# Patient Record
Sex: Male | Born: 1955 | Race: Black or African American | Hispanic: No | Marital: Single | Smoking: Current every day smoker
Health system: Southern US, Community
[De-identification: ages and names within clinical notes are randomized; demographics above are authoritative.]

## PROBLEM LIST (undated history)

## (undated) DIAGNOSIS — R569 Unspecified convulsions: Secondary | ICD-10-CM

## (undated) DIAGNOSIS — K759 Inflammatory liver disease, unspecified: Secondary | ICD-10-CM

## (undated) DIAGNOSIS — I10 Essential (primary) hypertension: Secondary | ICD-10-CM

## (undated) DIAGNOSIS — M1990 Unspecified osteoarthritis, unspecified site: Secondary | ICD-10-CM

## (undated) DIAGNOSIS — E119 Type 2 diabetes mellitus without complications: Secondary | ICD-10-CM

## (undated) DIAGNOSIS — E785 Hyperlipidemia, unspecified: Secondary | ICD-10-CM

## (undated) DIAGNOSIS — F32A Depression, unspecified: Secondary | ICD-10-CM

## (undated) DIAGNOSIS — G8929 Other chronic pain: Secondary | ICD-10-CM

## (undated) HISTORY — PX: APPENDECTOMY: SHX54

## (undated) HISTORY — PX: ABDOMINAL SURGERY: SHX537

## (undated) HISTORY — PX: HEMORROIDECTOMY: SUR656

## (undated) HISTORY — DX: Hyperlipidemia, unspecified: E78.5

## (undated) HISTORY — PX: CYST EXCISION: SHX5701

---

## 2008-09-05 ENCOUNTER — Emergency Department (HOSPITAL_COMMUNITY): Admission: EM | Admit: 2008-09-05 | Discharge: 2008-09-05 | Payer: Self-pay | Admitting: Emergency Medicine

## 2009-02-11 ENCOUNTER — Encounter: Admission: RE | Admit: 2009-02-11 | Discharge: 2009-02-11 | Payer: Self-pay | Admitting: Neurosurgery

## 2009-08-05 ENCOUNTER — Other Ambulatory Visit: Payer: Self-pay | Admitting: Emergency Medicine

## 2009-08-05 ENCOUNTER — Ambulatory Visit: Payer: Self-pay | Admitting: Psychiatry

## 2009-08-05 ENCOUNTER — Inpatient Hospital Stay (HOSPITAL_COMMUNITY): Admission: RE | Admit: 2009-08-05 | Discharge: 2009-08-09 | Payer: Self-pay | Admitting: Psychiatry

## 2010-08-15 LAB — URINALYSIS, ROUTINE W REFLEX MICROSCOPIC
Glucose, UA: NEGATIVE mg/dL
Hgb urine dipstick: NEGATIVE
Ketones, ur: NEGATIVE mg/dL
Nitrite: NEGATIVE
Protein, ur: NEGATIVE mg/dL
Specific Gravity, Urine: 1.016 (ref 1.005–1.030)
Urobilinogen, UA: 1 mg/dL (ref 0.0–1.0)
pH: 6.5 (ref 5.0–8.0)

## 2010-08-15 LAB — CBC
HCT: 43.8 % (ref 39.0–52.0)
Hemoglobin: 14.7 g/dL (ref 13.0–17.0)
MCHC: 33.7 g/dL (ref 30.0–36.0)
MCV: 103.8 fL — ABNORMAL HIGH (ref 78.0–100.0)
Platelets: 169 10*3/uL (ref 150–400)
RBC: 4.22 MIL/uL (ref 4.22–5.81)
RDW: 13.8 % (ref 11.5–15.5)
WBC: 4.8 10*3/uL (ref 4.0–10.5)

## 2010-08-15 LAB — RAPID URINE DRUG SCREEN, HOSP PERFORMED
Amphetamines: NOT DETECTED
Barbiturates: NOT DETECTED
Benzodiazepines: NOT DETECTED
Cocaine: POSITIVE — AB
Opiates: NOT DETECTED
Tetrahydrocannabinol: NOT DETECTED

## 2010-08-15 LAB — ETHANOL: Alcohol, Ethyl (B): 99 mg/dL — ABNORMAL HIGH (ref 0–10)

## 2010-08-15 LAB — DIFFERENTIAL
Basophils Absolute: 0 K/uL (ref 0.0–0.1)
Basophils Relative: 1 % (ref 0–1)
Eosinophils Absolute: 0 10*3/uL (ref 0.0–0.7)
Eosinophils Relative: 0 % (ref 0–5)
Lymphocytes Relative: 42 % (ref 12–46)
Lymphs Abs: 2 10*3/uL (ref 0.7–4.0)
Monocytes Absolute: 0.5 10*3/uL (ref 0.1–1.0)
Monocytes Relative: 11 % (ref 3–12)
Neutro Abs: 2.2 K/uL (ref 1.7–7.7)
Neutrophils Relative %: 47 % (ref 43–77)

## 2010-08-15 LAB — GLUCOSE, CAPILLARY
Glucose-Capillary: 100 mg/dL — ABNORMAL HIGH (ref 70–99)
Glucose-Capillary: 100 mg/dL — ABNORMAL HIGH (ref 70–99)
Glucose-Capillary: 145 mg/dL — ABNORMAL HIGH (ref 70–99)
Glucose-Capillary: 98 mg/dL (ref 70–99)
Glucose-Capillary: 99 mg/dL (ref 70–99)

## 2010-08-15 LAB — POCT I-STAT, CHEM 8
BUN: <3 mg/dL — ABNORMAL LOW (ref 6–23)
Calcium, Ion: 0.98 mmol/L — ABNORMAL LOW (ref 1.12–1.32)
Creatinine, Ser: 1 mg/dL (ref 0.4–1.5)
Glucose, Bld: 93 mg/dL (ref 70–99)
Hemoglobin: 15.6 g/dL (ref 13.0–17.0)
Sodium: 139 mEq/L (ref 135–145)
TCO2: 32 mmol/L (ref 0–100)

## 2010-08-15 LAB — HEPATIC FUNCTION PANEL
ALT: 47 U/L (ref 0–53)
Alkaline Phosphatase: 102 U/L (ref 39–117)
Indirect Bilirubin: 0.9 mg/dL (ref 0.3–0.9)
Total Bilirubin: 1.1 mg/dL (ref 0.3–1.2)
Total Protein: 6.4 g/dL (ref 6.0–8.3)

## 2010-08-31 LAB — COMPREHENSIVE METABOLIC PANEL
ALT: 33 U/L (ref 0–53)
AST: 27 U/L (ref 0–37)
Albumin: 3.1 g/dL — ABNORMAL LOW (ref 3.5–5.2)
Alkaline Phosphatase: 85 U/L (ref 39–117)
CO2: 30 mEq/L (ref 19–32)
Chloride: 96 mEq/L (ref 96–112)
GFR calc Af Amer: >60 mL/min (ref >60)
GFR calc non Af Amer: >60 mL/min (ref >60)
Potassium: 4.2 mEq/L (ref 3.5–5.1)
Total Bilirubin: 0.8 mg/dL (ref 0.3–1.2)

## 2010-08-31 LAB — CBC
HCT: 49.1 % (ref 39.0–52.0)
Hemoglobin: 16.4 g/dL (ref 13.0–17.0)
Platelets: 180 10*3/uL (ref 150–400)
RDW: 15 % (ref 11.5–15.5)
WBC: 11.7 10*3/uL — ABNORMAL HIGH (ref 4.0–10.5)

## 2010-08-31 LAB — DIFFERENTIAL
Basophils Absolute: 0.1 10*3/uL (ref 0.0–0.1)
Lymphocytes Relative: 14 % (ref 12–46)
Lymphs Abs: 1.6 10*3/uL (ref 0.7–4.0)
Neutro Abs: 8.7 10*3/uL — ABNORMAL HIGH (ref 1.7–7.7)

## 2010-08-31 LAB — RAPID URINE DRUG SCREEN, HOSP PERFORMED
Amphetamines: POSITIVE — AB
Opiates: NOT DETECTED
Tetrahydrocannabinol: NOT DETECTED

## 2010-08-31 LAB — URINALYSIS, ROUTINE W REFLEX MICROSCOPIC
Bilirubin Urine: NEGATIVE
Hgb urine dipstick: NEGATIVE
Protein, ur: NEGATIVE mg/dL
Urobilinogen, UA: 1 mg/dL (ref 0.0–1.0)

## 2010-08-31 LAB — GLUCOSE, CAPILLARY: Glucose-Capillary: 116 mg/dL — ABNORMAL HIGH (ref 70–99)

## 2010-10-11 ENCOUNTER — Emergency Department (HOSPITAL_COMMUNITY)
Admission: EM | Admit: 2010-10-11 | Discharge: 2010-10-11 | Disposition: A | Discharge status: Home / Self Care | Payer: Medicaid Other | Attending: Emergency Medicine | Admitting: Emergency Medicine

## 2010-10-11 ENCOUNTER — Emergency Department (HOSPITAL_COMMUNITY): Payer: Medicaid Other

## 2010-10-11 DIAGNOSIS — E876 Hypokalemia: Secondary | ICD-10-CM | POA: Insufficient documentation

## 2010-10-11 DIAGNOSIS — G8929 Other chronic pain: Secondary | ICD-10-CM | POA: Insufficient documentation

## 2010-10-11 DIAGNOSIS — I10 Essential (primary) hypertension: Secondary | ICD-10-CM | POA: Insufficient documentation

## 2010-10-11 DIAGNOSIS — R002 Palpitations: Secondary | ICD-10-CM | POA: Insufficient documentation

## 2010-10-11 DIAGNOSIS — M545 Low back pain, unspecified: Secondary | ICD-10-CM | POA: Insufficient documentation

## 2010-10-11 DIAGNOSIS — E119 Type 2 diabetes mellitus without complications: Secondary | ICD-10-CM | POA: Insufficient documentation

## 2010-10-11 LAB — DIFFERENTIAL
Basophils Absolute: 0 10*3/uL (ref 0.0–0.1)
Eosinophils Relative: 0 % (ref 0–5)
Lymphocytes Relative: 59 % — ABNORMAL HIGH (ref 12–46)
Neutro Abs: 1 10*3/uL — ABNORMAL LOW (ref 1.7–7.7)
Neutrophils Relative %: 29 % — ABNORMAL LOW (ref 43–77)

## 2010-10-11 LAB — CBC
HCT: 42.8 % (ref 39.0–52.0)
Platelets: 141 10*3/uL — ABNORMAL LOW (ref 150–400)
RBC: 4.44 MIL/uL (ref 4.22–5.81)
RDW: 13.3 % (ref 11.5–15.5)
WBC: 3.6 10*3/uL — ABNORMAL LOW (ref 4.0–10.5)

## 2010-10-11 LAB — BASIC METABOLIC PANEL
Calcium: 8.8 mg/dL (ref 8.4–10.5)
GFR calc Af Amer: >60 mL/min (ref >60)
GFR calc non Af Amer: >60 mL/min (ref >60)
Glucose, Bld: 115 mg/dL — ABNORMAL HIGH (ref 70–99)
Potassium: 2.9 mEq/L — ABNORMAL LOW (ref 3.5–5.1)
Sodium: 139 mEq/L (ref 135–145)

## 2010-10-11 LAB — POCT CARDIAC MARKERS
CKMB, poc: <1 ng/mL — ABNORMAL LOW (ref 1.0–8.0)
Myoglobin, poc: 75.2 ng/mL (ref 12–200)

## 2010-10-11 LAB — PROTIME-INR: INR: 0.93 (ref 0.00–1.49)

## 2011-09-01 ENCOUNTER — Emergency Department (HOSPITAL_COMMUNITY)
Admission: EM | Admit: 2011-09-01 | Discharge: 2011-09-01 | Disposition: A | Discharge status: Home / Self Care | Payer: Medicaid Other

## 2011-09-01 ENCOUNTER — Encounter (HOSPITAL_COMMUNITY): Payer: Self-pay | Admitting: *Deleted

## 2011-09-01 HISTORY — DX: Unspecified osteoarthritis, unspecified site: M19.90

## 2011-09-01 HISTORY — DX: Other chronic pain: G89.29

## 2011-09-01 NOTE — ED Notes (Signed)
Patient not in triage or waiting room.
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
## 2011-09-02 ENCOUNTER — Emergency Department (HOSPITAL_COMMUNITY)
Admission: EM | Admit: 2011-09-02 | Discharge: 2011-09-02 | Disposition: A | Discharge status: Home / Self Care | Payer: Medicaid Other | Attending: Emergency Medicine | Admitting: Emergency Medicine

## 2011-09-02 ENCOUNTER — Emergency Department (HOSPITAL_COMMUNITY): Payer: Medicaid Other

## 2011-09-02 ENCOUNTER — Encounter (HOSPITAL_COMMUNITY): Payer: Self-pay

## 2011-09-02 DIAGNOSIS — T1490XA Injury, unspecified, initial encounter: Secondary | ICD-10-CM | POA: Insufficient documentation

## 2011-09-02 DIAGNOSIS — M542 Cervicalgia: Secondary | ICD-10-CM | POA: Insufficient documentation

## 2011-09-02 DIAGNOSIS — IMO0002 Reserved for concepts with insufficient information to code with codable children: Secondary | ICD-10-CM | POA: Insufficient documentation

## 2011-09-02 DIAGNOSIS — M549 Dorsalgia, unspecified: Secondary | ICD-10-CM | POA: Insufficient documentation

## 2011-09-02 MED ORDER — ACETAMINOPHEN 325 MG PO TABS
650.0000 mg | ORAL_TABLET | Freq: Once | ORAL | Status: DC
  Filled 2011-09-02: qty 2

## 2011-09-02 MED ORDER — OXYCODONE-ACETAMINOPHEN 5-325 MG PO TABS: 2.0000 | ORAL_TABLET | ORAL | Status: AC | PRN

## 2011-09-02 NOTE — ED Provider Notes (Signed)
Medical screening examination/treatment/procedure(s) were performed by non-physician practitioner and as supervising physician I was immediately available for consultation/collaboration.  Juliet Rude. Rubin Payor, MD 09/02/11 1945
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
## 2012-07-31 ENCOUNTER — Encounter (HOSPITAL_COMMUNITY): Payer: Self-pay | Admitting: *Deleted

## 2012-07-31 ENCOUNTER — Emergency Department (HOSPITAL_COMMUNITY)
Admission: EM | Admit: 2012-07-31 | Discharge: 2012-07-31 | Disposition: A | Discharge status: Home / Self Care | Payer: Medicaid Other | Attending: Emergency Medicine | Admitting: Emergency Medicine

## 2012-07-31 DIAGNOSIS — R197 Diarrhea, unspecified: Secondary | ICD-10-CM | POA: Insufficient documentation

## 2012-07-31 DIAGNOSIS — R112 Nausea with vomiting, unspecified: Secondary | ICD-10-CM | POA: Insufficient documentation

## 2012-07-31 DIAGNOSIS — G8929 Other chronic pain: Secondary | ICD-10-CM | POA: Insufficient documentation

## 2012-07-31 DIAGNOSIS — F17200 Nicotine dependence, unspecified, uncomplicated: Secondary | ICD-10-CM | POA: Insufficient documentation

## 2012-07-31 DIAGNOSIS — Z79899 Other long term (current) drug therapy: Secondary | ICD-10-CM | POA: Insufficient documentation

## 2012-07-31 DIAGNOSIS — F1010 Alcohol abuse, uncomplicated: Secondary | ICD-10-CM | POA: Insufficient documentation

## 2012-07-31 DIAGNOSIS — E119 Type 2 diabetes mellitus without complications: Secondary | ICD-10-CM | POA: Insufficient documentation

## 2012-07-31 DIAGNOSIS — Z8739 Personal history of other diseases of the musculoskeletal system and connective tissue: Secondary | ICD-10-CM | POA: Insufficient documentation

## 2012-07-31 DIAGNOSIS — I10 Essential (primary) hypertension: Secondary | ICD-10-CM | POA: Insufficient documentation

## 2012-07-31 HISTORY — DX: Type 2 diabetes mellitus without complications: E11.9

## 2012-07-31 HISTORY — DX: Essential (primary) hypertension: I10

## 2012-07-31 LAB — CBC WITH DIFFERENTIAL/PLATELET
Hemoglobin: 14.1 g/dL (ref 13.0–17.0)
Lymphocytes Relative: 68 % — ABNORMAL HIGH (ref 12–46)
Lymphs Abs: 2.2 10*3/uL (ref 0.7–4.0)
MCH: 34.5 pg — ABNORMAL HIGH (ref 26.0–34.0)
Monocytes Relative: 13 % — ABNORMAL HIGH (ref 3–12)
Neutro Abs: 0.6 10*3/uL — ABNORMAL LOW (ref 1.7–7.7)
Neutrophils Relative %: 19 % — ABNORMAL LOW (ref 43–77)
Platelets: 127 10*3/uL — ABNORMAL LOW (ref 150–400)
RBC: 4.09 MIL/uL — ABNORMAL LOW (ref 4.22–5.81)
WBC: 3.2 10*3/uL — ABNORMAL LOW (ref 4.0–10.5)

## 2012-07-31 LAB — COMPREHENSIVE METABOLIC PANEL
ALT: 28 U/L (ref 0–53)
Alkaline Phosphatase: 102 U/L (ref 39–117)
BUN: 11 mg/dL (ref 6–23)
CO2: 32 mEq/L (ref 19–32)
Chloride: 100 mEq/L (ref 96–112)
GFR calc Af Amer: >90 mL/min (ref >90)
Glucose, Bld: 99 mg/dL (ref 70–99)
Potassium: 4.5 mEq/L (ref 3.5–5.1)
Sodium: 140 mEq/L (ref 135–145)
Total Bilirubin: 0.3 mg/dL (ref 0.3–1.2)
Total Protein: 7.5 g/dL (ref 6.0–8.3)

## 2012-07-31 LAB — URINALYSIS, ROUTINE W REFLEX MICROSCOPIC
Bilirubin Urine: NEGATIVE
Leukocytes, UA: NEGATIVE
Nitrite: NEGATIVE
Specific Gravity, Urine: 1.006 (ref 1.005–1.030)
Urobilinogen, UA: 0.2 mg/dL (ref 0.0–1.0)
pH: 6.5 (ref 5.0–8.0)

## 2012-07-31 LAB — GLUCOSE, CAPILLARY

## 2012-07-31 LAB — LIPASE, BLOOD: Lipase: 10 U/L — ABNORMAL LOW (ref 11–59)

## 2012-07-31 LAB — ETHANOL: Alcohol, Ethyl (B): 329 mg/dL — ABNORMAL HIGH (ref 0–11)

## 2012-07-31 MED ORDER — GLIPIZIDE 5 MG PO TABS: 5.0000 mg | ORAL_TABLET | Freq: Two times a day (BID) | ORAL | Status: DC

## 2012-07-31 MED ORDER — SIMVASTATIN 20 MG PO TABS: 10.0000 mg | ORAL_TABLET | Freq: Every day | ORAL | Status: DC

## 2012-07-31 MED ORDER — OXYCODONE-ACETAMINOPHEN 5-325 MG PO TABS
2.0000 | ORAL_TABLET | Freq: Once | ORAL | Status: AC
  Administered 2012-07-31: 2 via ORAL

## 2012-07-31 MED ORDER — GABAPENTIN 300 MG PO CAPS: 300.0000 mg | ORAL_CAPSULE | Freq: Three times a day (TID) | ORAL | Status: DC

## 2012-07-31 MED ORDER — ONDANSETRON 8 MG PO TBDP
8.0000 mg | ORAL_TABLET | Freq: Once | ORAL | Status: AC
  Administered 2012-07-31: 8 mg via ORAL

## 2012-07-31 MED ORDER — LISINOPRIL 5 MG PO TABS: 5.0000 mg | ORAL_TABLET | Freq: Every day | ORAL | Status: DC

## 2012-07-31 MED ORDER — ONDANSETRON HCL 8 MG PO TABS: 8.0000 mg | ORAL_TABLET | Freq: Three times a day (TID) | ORAL | Status: DC | PRN

## 2012-07-31 NOTE — ED Notes (Signed)
Pt observed walking in the hallway with no difficulty.

## 2012-07-31 NOTE — ED Notes (Signed)
CBG 84. 

## 2012-07-31 NOTE — ED Notes (Signed)
Per EMS report: Pt from home: Pt c/o of generalized weakness and malaise for a few days.  [facility]  Pt endorses drinking a 40oz beer.  Pt hasn't taken BP medications for the past few days.  BP: 168/96, HR: 90, RR: 16, CBG: 107

## 2012-07-31 NOTE — ED Notes (Signed)
Pt able to tolerate PO fluids and food.

## 2012-07-31 NOTE — ED Provider Notes (Signed)
History     CSN: [id number]  Arrival date & time 07/31/12  0106   First MD Initiated Contact with Patient 07/31/12 0146      Chief Complaint  Patient presents with  . Weakness    (Consider location/radiation/quality/duration/timing/severity/associated sxs/prior treatment) HPI History provided by pt.   Level 5 caveat applies because patient is intoxicated.  Pt presents w/ multiple complaints.  He is a patient of the [facility] in [place] and next f/u appointment isn't until 3/28.  He is out of all medications, including those he takes for various chronic pains, and [facility] to have them ref[facility].  He has also had N/V/D for the past 1.5 days.  Associated w/ abdominal pain.  Denies fever, chest pain, dyspnea, hematemesis/hematochezia/melena and urinary sx.   Past Medical History  Diagnosis Date  . Chronic pain   . Arthritis   . Diabetes mellitus without complication   . Hypertension   . Chronic pain     Past Surgical History  Procedure Laterality Date  . Abdominal surgery      History reviewed. No pertinent family history.  History  Substance Use Topics  . Smoking status: Current Every Day Smoker    Types: Cigarettes  . Smokeless tobacco: Not on file  . Alcohol Use: Yes      Review of Systems  All other systems reviewed and are negative.    Allergies  Tylenol  Home Medications   Current Outpatient Rx  Name  Route  Sig  Dispense  Refill  . gabapentin (NEURONTIN) 300 MG capsule   Oral   Take 300 mg by mouth 3 (three) times daily as needed. For nerve pain         . glipiZIDE (GLUCOTROL) 5 MG tablet   Oral   Take 5 mg by mouth 2 (two) times daily before a meal.         . HYDROcodone-acetaminophen (NORCO) 10-325 MG per tablet   Oral   Take 1 tablet by mouth every 6 (six) hours as needed for pain.         Marland Kitchen lisinopril (PRINIVIL,ZESTRIL) 10 MG tablet   Oral   Take 5 mg by mouth daily.         Marland Kitchen  oxyCODONE-acetaminophen (PERCOCET/ROXICET) 5-325 MG per tablet   Oral   Take 1 tablet by mouth every 6 (six) hours as needed for pain.         . simvastatin (ZOCOR) 10 MG tablet   Oral   Take 10 mg by mouth at bedtime.           BP 115/71  Pulse 82  Temp(Src) 98 F (36.7 C) (Oral)  Resp 22  SpO2 97%  Physical Exam  Nursing note and vitals reviewed. Constitutional: He is oriented to person, place, and time. He appears well-developed and well-nourished. No distress.  HENT:  Head: Normocephalic and atraumatic.  Eyes:  Normal appearance  Neck: Normal range of motion.  Cardiovascular: Normal rate and regular rhythm.   Pulmonary/Chest: Effort normal and breath sounds normal. No respiratory distress.  Abdominal: Soft. Bowel sounds are normal. He exhibits no distension and no mass. There is no tenderness. There is no rebound and no guarding.  Genitourinary:  No CVA tenderness  Musculoskeletal: Normal range of motion.  Neurological: He is alert and oriented to person, place, and time.  Skin: Skin is warm and dry. No rash noted.  Psychiatric:  Pressured and  tangential speech.  Cooperative and calm and giving compliments one moment and then agitated and accusatory the next.      ED Course  Procedures (including critical care time)  Labs Reviewed  CBC WITH DIFFERENTIAL - Abnormal; Notable for the following:    WBC 3.2 (*)    RBC 4.09 (*)    MCH 34.5 (*)    Platelets 127 (*)    Neutrophils Relative 19 (*)    Neutro Abs 0.6 (*)    Lymphocytes Relative 68 (*)    Monocytes Relative 13 (*)    All other components within normal limits  COMPREHENSIVE METABOLIC PANEL - Abnormal; Notable for the following:    Calcium 8.3 (*)    Albumin 3.4 (*)    AST 100 (*)    GFR calc non Af Amer 90 (*)    All other components within normal limits  LIPASE, BLOOD - Abnormal; Notable for the following:    Lipase 10 (*)    All other components within normal limits  ETHANOL - Abnormal;  Notable for the following:    Alcohol, Ethyl (B) 329 (*)    All other components within normal limits  URINALYSIS, ROUTINE W REFLEX MICROSCOPIC  RAPID HIV SCREEN ([facility])  GLUCOSE, CAPILLARY  PATHOLOGIST SMEAR REVIEW   No results found.   1. Nausea vomiting and diarrhea   2. Chronic pain   3. Alcohol intoxication       MDM  56yo intoxicated M w/ h/o gastritis and pancreatitis presents w/ several complaints.  Is out of all medications, including those taken for chronic pain, and requests refills.  [facility].  Also c/o N/V/D x 1.5 days.  On exam, afebrile, NAD, labile mood, well-hydrated, abd benign.  Nursing staff reports that patient has been in and out of bed requesting food and has eaten 2 sandwiches.  He received zofran and 2 percocet in ED.  His labs are unremarkable w/ exception of etoh 329.  D/c'd home w/ refills of all medications except narcotics.  Pt was argumentative initially but left ED w/out causing any problems.         Arie Sabina Felicha Frayne, PA-C 07/31/12 1419

## 2012-08-02 NOTE — ED Provider Notes (Signed)
Medical screening examination/treatment/procedure(s) were performed by non-physician practitioner and as supervising physician I was immediately available for consultation/collaboration.  Sunnie Nielsen, MD 08/02/12 1019

## 2012-10-02 ENCOUNTER — Inpatient Hospital Stay (HOSPITAL_COMMUNITY)
Admission: EM | Admit: 2012-10-02 | Discharge: 2012-10-08 | DRG: 871 | Disposition: A | Discharge status: Home Health | Payer: Medicaid Other | Attending: Family Medicine | Admitting: Family Medicine

## 2012-10-02 DIAGNOSIS — E876 Hypokalemia: Secondary | ICD-10-CM

## 2012-10-02 DIAGNOSIS — E871 Hypo-osmolality and hyponatremia: Secondary | ICD-10-CM

## 2012-10-02 DIAGNOSIS — E119 Type 2 diabetes mellitus without complications: Secondary | ICD-10-CM | POA: Diagnosis present

## 2012-10-02 DIAGNOSIS — K7030 Alcoholic cirrhosis of liver without ascites: Secondary | ICD-10-CM | POA: Diagnosis present

## 2012-10-02 DIAGNOSIS — M549 Dorsalgia, unspecified: Secondary | ICD-10-CM | POA: Diagnosis present

## 2012-10-02 DIAGNOSIS — Y92009 Unspecified place in unspecified non-institutional (private) residence as the place of occurrence of the external cause: Secondary | ICD-10-CM

## 2012-10-02 DIAGNOSIS — N390 Urinary tract infection, site not specified: Secondary | ICD-10-CM

## 2012-10-02 DIAGNOSIS — R7401 Elevation of levels of liver transaminase levels: Secondary | ICD-10-CM

## 2012-10-02 DIAGNOSIS — W010XXA Fall on same level from slipping, tripping and stumbling without subsequent striking against object, initial encounter: Secondary | ICD-10-CM | POA: Diagnosis present

## 2012-10-02 DIAGNOSIS — E160 Drug-induced hypoglycemia without coma: Secondary | ICD-10-CM

## 2012-10-02 DIAGNOSIS — G9349 Other encephalopathy: Secondary | ICD-10-CM | POA: Diagnosis present

## 2012-10-02 DIAGNOSIS — T383X1A Poisoning by insulin and oral hypoglycemic [antidiabetic] drugs, accidental (unintentional), initial encounter: Secondary | ICD-10-CM

## 2012-10-02 DIAGNOSIS — G8929 Other chronic pain: Secondary | ICD-10-CM

## 2012-10-02 DIAGNOSIS — N179 Acute kidney failure, unspecified: Secondary | ICD-10-CM | POA: Diagnosis present

## 2012-10-02 DIAGNOSIS — F1020 Alcohol dependence, uncomplicated: Secondary | ICD-10-CM | POA: Diagnosis present

## 2012-10-02 DIAGNOSIS — K861 Other chronic pancreatitis: Secondary | ICD-10-CM | POA: Diagnosis present

## 2012-10-02 DIAGNOSIS — R7881 Bacteremia: Secondary | ICD-10-CM

## 2012-10-02 DIAGNOSIS — D649 Anemia, unspecified: Secondary | ICD-10-CM | POA: Diagnosis present

## 2012-10-02 DIAGNOSIS — D72829 Elevated white blood cell count, unspecified: Secondary | ICD-10-CM | POA: Diagnosis present

## 2012-10-02 DIAGNOSIS — R000 Tachycardia, unspecified: Secondary | ICD-10-CM | POA: Diagnosis present

## 2012-10-02 DIAGNOSIS — A419 Sepsis, unspecified organism: Secondary | ICD-10-CM

## 2012-10-02 DIAGNOSIS — K7010 Alcoholic hepatitis without ascites: Secondary | ICD-10-CM | POA: Diagnosis present

## 2012-10-02 DIAGNOSIS — W19XXXA Unspecified fall, initial encounter: Secondary | ICD-10-CM

## 2012-10-02 DIAGNOSIS — A4151 Sepsis due to Escherichia coli [E. coli]: Principal | ICD-10-CM | POA: Diagnosis present

## 2012-10-02 DIAGNOSIS — F17200 Nicotine dependence, unspecified, uncomplicated: Secondary | ICD-10-CM | POA: Diagnosis present

## 2012-10-02 DIAGNOSIS — R6520 Severe sepsis without septic shock: Secondary | ICD-10-CM | POA: Diagnosis present

## 2012-10-02 DIAGNOSIS — R6521 Severe sepsis with septic shock: Secondary | ICD-10-CM

## 2012-10-02 DIAGNOSIS — IMO0002 Reserved for concepts with insufficient information to code with codable children: Secondary | ICD-10-CM

## 2012-10-02 DIAGNOSIS — R5381 Other malaise: Secondary | ICD-10-CM | POA: Diagnosis present

## 2012-10-02 DIAGNOSIS — R4182 Altered mental status, unspecified: Secondary | ICD-10-CM | POA: Diagnosis present

## 2012-10-02 LAB — GLUCOSE, CAPILLARY: Glucose-Capillary: 23 mg/dL — CL (ref 70–99)

## 2012-10-02 NOTE — ED Notes (Signed)
Per EMS, pt called c/o fall last night at 2100 and again at 0200. Pt unable to tolerate pain with Tylenol, so he called EMS. Pt has chronic neck & back pain and was turned down by the [facility] for pain management. Enroute EMS, found CBG= 44, then 36 on recheck.

## 2012-10-02 NOTE — ED Notes (Signed)
UEA:[id number]<WJ> Expected date:<BR> Expected time:<BR> Means of arrival:<BR> Comments:<BR> EMS 72M back pain after 2 falls yesterday

## 2012-10-03 ENCOUNTER — Inpatient Hospital Stay (HOSPITAL_COMMUNITY): Payer: Medicaid Other

## 2012-10-03 ENCOUNTER — Encounter (HOSPITAL_COMMUNITY): Payer: Self-pay | Admitting: Emergency Medicine

## 2012-10-03 ENCOUNTER — Emergency Department (HOSPITAL_COMMUNITY): Payer: Medicaid Other

## 2012-10-03 DIAGNOSIS — E160 Drug-induced hypoglycemia without coma: Secondary | ICD-10-CM

## 2012-10-03 DIAGNOSIS — K7010 Alcoholic hepatitis without ascites: Secondary | ICD-10-CM | POA: Diagnosis present

## 2012-10-03 DIAGNOSIS — T383X1A Poisoning by insulin and oral hypoglycemic [antidiabetic] drugs, accidental (unintentional), initial encounter: Secondary | ICD-10-CM

## 2012-10-03 DIAGNOSIS — N179 Acute kidney failure, unspecified: Secondary | ICD-10-CM | POA: Diagnosis present

## 2012-10-03 DIAGNOSIS — D72829 Elevated white blood cell count, unspecified: Secondary | ICD-10-CM | POA: Diagnosis present

## 2012-10-03 DIAGNOSIS — K861 Other chronic pancreatitis: Secondary | ICD-10-CM | POA: Diagnosis present

## 2012-10-03 LAB — PROTIME-INR: INR: 1.34 (ref 0.00–1.49)

## 2012-10-03 LAB — COMPREHENSIVE METABOLIC PANEL
ALT: 60 U/L — ABNORMAL HIGH (ref 0–53)
AST: 197 U/L — ABNORMAL HIGH (ref 0–37)
Albumin: 2 g/dL — ABNORMAL LOW (ref 3.5–5.2)
Alkaline Phosphatase: 232 U/L — ABNORMAL HIGH (ref 39–117)
BUN: 16 mg/dL (ref 6–23)
Chloride: 91 mEq/L — ABNORMAL LOW (ref 96–112)
Potassium: 4.2 mEq/L (ref 3.5–5.1)
Sodium: 129 mEq/L — ABNORMAL LOW (ref 135–145)
Total Bilirubin: 2.3 mg/dL — ABNORMAL HIGH (ref 0.3–1.2)
Total Protein: 6.4 g/dL (ref 6.0–8.3)

## 2012-10-03 LAB — CBC WITH DIFFERENTIAL/PLATELET
Basophils Absolute: 0 10*3/uL (ref 0.0–0.1)
Basophils Relative: 0 % (ref 0–1)
Eosinophils Absolute: 0 10*3/uL (ref 0.0–0.7)
HCT: 32.8 % — ABNORMAL LOW (ref 39.0–52.0)
Hemoglobin: 11.7 g/dL — ABNORMAL LOW (ref 13.0–17.0)
Lymphocytes Relative: 4 % — ABNORMAL LOW (ref 12–46)
Lymphs Abs: 1.3 10*3/uL (ref 0.7–4.0)
MCH: 33.8 pg (ref 26.0–34.0)
MCHC: 35.7 g/dL (ref 30.0–36.0)
MCV: 94.8 fL (ref 78.0–100.0)
Monocytes Absolute: 0.9 10*3/uL (ref 0.1–1.0)
Neutro Abs: 29.2 10*3/uL — ABNORMAL HIGH (ref 1.7–7.7)
RDW: 17.6 % — ABNORMAL HIGH (ref 11.5–15.5)

## 2012-10-03 LAB — GLUCOSE, CAPILLARY
Glucose-Capillary: 141 mg/dL — ABNORMAL HIGH (ref 70–99)
Glucose-Capillary: 145 mg/dL — ABNORMAL HIGH (ref 70–99)
Glucose-Capillary: 173 mg/dL — ABNORMAL HIGH (ref 70–99)
Glucose-Capillary: 178 mg/dL — ABNORMAL HIGH (ref 70–99)
Glucose-Capillary: 20 mg/dL — CL (ref 70–99)
Glucose-Capillary: 201 mg/dL — ABNORMAL HIGH (ref 70–99)
Glucose-Capillary: 225 mg/dL — ABNORMAL HIGH (ref 70–99)
Glucose-Capillary: 42 mg/dL — CL (ref 70–99)
Glucose-Capillary: 67 mg/dL — ABNORMAL LOW (ref 70–99)
Glucose-Capillary: <10 mg/dL — CL (ref 70–99)

## 2012-10-03 LAB — URINALYSIS, ROUTINE W REFLEX MICROSCOPIC
Glucose, UA: 500 mg/dL — AB
Ketones, ur: NEGATIVE mg/dL
Protein, ur: NEGATIVE mg/dL
Urobilinogen, UA: 1 mg/dL (ref 0.0–1.0)

## 2012-10-03 LAB — URINE MICROSCOPIC-ADD ON

## 2012-10-03 LAB — RAPID URINE DRUG SCREEN, HOSP PERFORMED
Amphetamines: NOT DETECTED
Barbiturates: NOT DETECTED
Cocaine: NOT DETECTED
Opiates: POSITIVE — AB
Tetrahydrocannabinol: NOT DETECTED

## 2012-10-03 LAB — HEMOGLOBIN A1C: Mean Plasma Glucose: 103 mg/dL (ref <117)

## 2012-10-03 MED ORDER — MORPHINE SULFATE 4 MG/ML IJ SOLN
4.0000 mg | Freq: Once | INTRAMUSCULAR | Status: AC
  Administered 2012-10-03: 4 mg via INTRAVENOUS
  Filled 2012-10-03: qty 1

## 2012-10-03 MED ORDER — GLUCERNA SHAKE PO LIQD
237.0000 mL | Freq: Three times a day (TID) | ORAL | Status: DC
  Administered 2012-10-03 – 2012-10-08 (×9): 237 mL via ORAL
  Filled 2012-10-03 (×17): qty 237

## 2012-10-03 MED ORDER — HEPARIN SODIUM (PORCINE) 5000 UNIT/ML IJ SOLN
5000.0000 [IU] | Freq: Three times a day (TID) | INTRAMUSCULAR | Status: DC
  Administered 2012-10-03 – 2012-10-05 (×6): 5000 [IU] via SUBCUTANEOUS
  Filled 2012-10-03 (×10): qty 1

## 2012-10-03 MED ORDER — SODIUM CHLORIDE 0.9 % IV SOLN
INTRAVENOUS | Status: DC
  Administered 2012-10-03 (×2): via INTRAVENOUS

## 2012-10-03 MED ORDER — DEXTROSE-NACL 5-0.45 % IV SOLN
INTRAVENOUS | Status: DC
  Administered 2012-10-03: 21:00:00 via INTRAVENOUS

## 2012-10-03 MED ORDER — HYDROMORPHONE HCL PF 1 MG/ML IJ SOLN
1.0000 mg | INTRAMUSCULAR | Status: AC
  Administered 2012-10-03: 1 mg via INTRAVENOUS

## 2012-10-03 MED ORDER — LORAZEPAM 2 MG/ML IJ SOLN
1.0000 mg | INTRAMUSCULAR | Status: AC
  Administered 2012-10-03: 1 mg via INTRAVENOUS

## 2012-10-03 MED ORDER — HYDROMORPHONE HCL 1 MG/ML PO LIQD: 1.0000 mg | ORAL | Status: DC

## 2012-10-03 MED ORDER — GLUCOSE 40 % PO GEL
ORAL | Status: AC
  Administered 2012-10-03: 37.5 g
  Filled 2012-10-03: qty 1

## 2012-10-03 MED ORDER — ADULT MULTIVITAMIN W/MINERALS CH
1.0000 | ORAL_TABLET | Freq: Every day | ORAL | Status: DC
  Administered 2012-10-03 – 2012-10-08 (×6): 1 via ORAL
  Filled 2012-10-03 (×6): qty 1

## 2012-10-03 MED ORDER — SODIUM CHLORIDE 0.9 % IV BOLUS (SEPSIS)
1000.0000 mL | Freq: Once | INTRAVENOUS | Status: AC
  Administered 2012-10-03: 1000 mL via INTRAVENOUS

## 2012-10-03 MED ORDER — DEXTROSE 50 % IV SOLN
INTRAVENOUS | Status: AC
  Administered 2012-10-03: 25 mL
  Filled 2012-10-03: qty 50

## 2012-10-03 MED ORDER — HYDROCODONE-ACETAMINOPHEN 5-325 MG PO TABS
1.0000 | ORAL_TABLET | Freq: Four times a day (QID) | ORAL | Status: DC | PRN
  Administered 2012-10-03: 2 via ORAL
  Filled 2012-10-03: qty 2

## 2012-10-03 MED ORDER — DEXTROSE 50 % IV SOLN
1.0000 | Freq: Once | INTRAVENOUS | Status: AC
  Administered 2012-10-03: 50 mL via INTRAVENOUS
  Filled 2012-10-03: qty 50

## 2012-10-03 MED ORDER — HYDROMORPHONE HCL PF 1 MG/ML IJ SOLN
INTRAMUSCULAR | Status: AC
  Filled 2012-10-03: qty 1

## 2012-10-03 MED ORDER — DEXTROSE 50 % IV SOLN: 50.0000 mL | Freq: Once | INTRAVENOUS | Status: AC | PRN

## 2012-10-03 MED ORDER — LORAZEPAM 2 MG/ML IJ SOLN
INTRAMUSCULAR | Status: AC
  Filled 2012-10-03: qty 1

## 2012-10-03 MED ORDER — GABAPENTIN 300 MG PO CAPS
300.0000 mg | ORAL_CAPSULE | Freq: Three times a day (TID) | ORAL | Status: DC
  Administered 2012-10-03 – 2012-10-08 (×14): 300 mg via ORAL
  Filled 2012-10-03 (×19): qty 1

## 2012-10-03 MED ORDER — HYDROMORPHONE HCL PF 1 MG/ML IJ SOLN
1.0000 mg | INTRAMUSCULAR | Status: DC | PRN
  Administered 2012-10-03 – 2012-10-04 (×3): 1 mg via INTRAVENOUS
  Filled 2012-10-03 (×3): qty 1

## 2012-10-03 MED ORDER — OXYCODONE-ACETAMINOPHEN 5-325 MG PO TABS
2.0000 | ORAL_TABLET | Freq: Once | ORAL | Status: AC
  Administered 2012-10-03: 2 via ORAL
  Filled 2012-10-03 (×2): qty 2

## 2012-10-03 MED ORDER — SIMVASTATIN 10 MG PO TABS
10.0000 mg | ORAL_TABLET | Freq: Every day | ORAL | Status: DC
  Filled 2012-10-03: qty 1

## 2012-10-03 MED ORDER — SODIUM CHLORIDE 0.9 % IJ SOLN
3.0000 mL | Freq: Two times a day (BID) | INTRAMUSCULAR | Status: DC
  Administered 2012-10-03 – 2012-10-06 (×6): 3 mL via INTRAVENOUS

## 2012-10-03 NOTE — ED Notes (Signed)
Lab was called for pts labs that were unable to be collected by 2 RNs and IV team after 8 sticks. Lab stated that they were busy getting their morning collection labels ready and that they would be up after that. RN and charge RN made aware

## 2012-10-03 NOTE — Progress Notes (Signed)
INITIAL NUTRITION ASSESSMENT  DOCUMENTATION CODES Per approved criteria  -Not Applicable   INTERVENTION: Provide Glucerna Shakes TID Encourage PO intake Provide Multivitamin with minerals daily  NUTRITION DIAGNOSIS: Unintentional wt loss related to poor appetite and increased physical activity as evidenced by pt at 86% of usual body weight.   Goal: Pt to meet >/= 90% of their estimated nutrition needs  Monitor:  PO intake Weight Labs  Reason for Assessment: MST  57 y.o. male  Admitting Dx: Hypoglycemia, Fall  ASSESSMENT: 57 y.o. male who presents to the ED from home with c/o neck and back pain after a fall.  Pt reports that he usually weighs 170 lbs but has lost weight recently due to poor appetite and increased physical activity. Pt reports that depression and anxiety have been contributing to his poor appetite. Pt seemed a bit confused at time of visit; despite reporting having a poor appetite, pt states he has been eating 3 good meals daily. Pt was NPO at time of visit but states he is very hungry.   Height: Ht Readings from Last 1 Encounters:  10/03/12 6\' 1"  (1.854 m)    Weight: Wt Readings from Last 1 Encounters:  10/03/12 146 lb 11.2 oz (66.543 kg)    Ideal Body Weight: 184 lbs  % Ideal Body Weight: 79%  Wt Readings from Last 10 Encounters:  10/03/12 146 lb 11.2 oz (66.543 kg)    Usual Body Weight: 170 lbs  % Usual Body Weight: 86%  BMI:  Body mass index is 19.36 kg/(m^2).  Estimated Nutritional Needs: Kcal: 2130-2330 Protein: 80-93 grams Fluid: 2.2 L  Skin: WDL  Diet Order: Carb Control  EDUCATION NEEDS: -No education needs identified at this time   Intake/Output Summary (Last 24 hours) at 10/03/12 1331 Last data filed at 10/03/12 1610  Gross per 24 hour  Intake  18.75 ml  Output      0 ml  Net  18.75 ml    Last BM: 5/14  Labs:   Recent Labs Lab 10/03/12 0052  NA 129*  K 4.2  CL 91*  CO2 26  BUN 16  CREATININE 1.99*   CALCIUM 7.9*  GLUCOSE 315*    CBG (last 3)   Recent Labs  10/03/12 0626 10/03/12 0751 10/03/12 1208  GLUCAP 141* 163* 80    Scheduled Meds: . gabapentin  300 mg Oral TID  . heparin  5,000 Units Subcutaneous Q8H  . HYDROmorphone      . LORazepam      . simvastatin  10 mg Oral QHS  . sodium chloride  3 mL Intravenous Q12H    Continuous Infusions: . sodium chloride 125 mL/hr at 10/03/12 9604    Past Medical History  Diagnosis Date  . Chronic pain   . Arthritis   . Diabetes mellitus without complication   . Hypertension   . Chronic pain     Past Surgical History  Procedure Laterality Date  . Abdominal surgery      Ian Malkin RD, LDN Inpatient Clinical Dietitian Pager: [phone] After Hours Pager: [phone]

## 2012-10-03 NOTE — Progress Notes (Signed)
Inpatient Diabetes Program Recommendations  AACE/ADA: New Consensus Statement on Inpatient Glycemic Control (2013)  Target Ranges:  Prepandial:   less than 140 mg/dL      Peak postprandial:   less than 180 mg/dL (1-2 hours)      Critically ill patients:  140 - 180 mg/dL   Reason for Visit: Hypoglycemia on admission Results for Dennis Zhang, Dennis Zhang (MRN [id number]) as of 10/03/2012 15:05  Ref. Range 10/02/2012 23:54 10/03/2012 00:14 10/03/2012 00:37 10/03/2012 00:56 10/03/2012 01:20 10/03/2012 02:14 10/03/2012 03:45 10/03/2012 06:26 10/03/2012 07:51 10/03/2012 12:08  Glucose-Capillary Latest Range: 70-99 mg/dL 23 (LL) <14 (LL) <78 (LL) 201 (H) 173 (H) 225 (H) 197 (H) 141 (H) 163 (H) 80     Inpatient Diabetes Program Recommendations Correction (SSI): Decrease Novolog to sensitive tidwc HgbA1C: Check HgbA1C to assess glycemic control prior to hospitalization  Note: On OHAs at home.  Will follow. Thank you. Ailene Ards, RD, LDN, CDE Inpatient Diabetes Coordinator [phone]

## 2012-10-03 NOTE — Progress Notes (Addendum)
Hypoglycemic Event  CBG: 46  Treatment: 8 ounce orange juice  Symptoms:  Pt a little shaky  Follow-up CBG: Time: 1943 CBG Result:20  Possible Reasons for Event: lack of nutritional intake  Comments/MD notified: Administered 25ml D50 & recheck in  In CBG did not register b/c so low, administered remaining 25ml D50 and paged MD. Pt arousable and able to answer questions appropriately.  He states that he is fine. Took set of VSs, pt's extremities cool to touch, pt able to state name, place & DOB. MD responded and placed new orders. Pt's CBG is now greater than 200. Reported to night RN.     Martyn Ehrich N  Remember to initiate Hypoglycemia Order Set & complete

## 2012-10-03 NOTE — H&P (Signed)
[facility] History and Physical  [name] ZOX:[id number] DOB: 07-Jun-1955 DOA: 10/02/2012  Referring physician: ED PCP: [name], MD   Chief Complaint: Hypoglycemia, fall  HPI: Dennis Zhang is a 57 y.o. male who presents to the ED from home with c/o neck and back pain after a fall.  Patient got off of the bus yesterday and fell.  Unfortunately since then his chronic neck and back pain have been worse.  This morning he was leaning over to get a glass of water, and again fell striking his back on the bedside.  He is soon to be seen by pain management he says but has run out of his Vicodin and percocet.  EMS noted that the patient was markedly hypoglycemic (he is on glipizide for sugars).  Patient reports he has lost 30 lbs over the past 6 months, although he states he only drinks "occasionally" he does have a previous history of extensive EtOH use.  He also has a PMH of chronic pancreatitis but states his abdomen isnt really bothering him today.  In the ED in addition to an undetectable BGL level initially (patient conscious, talking, and other than his chronic back pain asymptomatic despite this which was confirmed on repeat testing).  Other lab abnormalities include a WBC of 31k (no other SIRS criteria), transaminitis and elevated bilirubin in a pattern worrisome for EtOH hepatitis.  AKI with a creatinine of 1.99 (baseline <1).  Hyponatremia, normocytic anemia, and platelets.  Hospitalist has been asked to put him in the hospital for any and all of the above.  Review of Systems: 12 systems reviewed and otherwise negative.  Past Medical History  Diagnosis Date  . Chronic pain   . Arthritis   . Diabetes mellitus without complication   . Hypertension   . Chronic pain    Past Surgical History  Procedure Laterality Date  . Abdominal surgery     Social History:  reports that he has been smoking Cigarettes.  He has been smoking about 0.00 packs per day. He does not have any  smokeless tobacco history on file. He reports that  drinks alcohol. He reports that he does not use illicit drugs.   Allergies  Allergen Reactions  . Tylenol (Acetaminophen) Nausea Only    States can take Tylenol if has other pain med w/it - like Hydrocodone    No family history on file.  Non-contributory.  Prior to Admission medications   Medication Sig Start Date End Date Taking? Authorizing Provider  gabapentin (NEURONTIN) 300 MG capsule Take 1 capsule (300 mg total) by mouth 3 (three) times daily. 07/31/12   Arie Sabina Schinlever, PA-C  glipiZIDE (GLUCOTROL) 5 MG tablet Take 1 tablet (5 mg total) by mouth 2 (two) times daily before a meal. 07/31/12   Arie Sabina Schinlever, PA-C  lisinopril (PRINIVIL,ZESTRIL) 5 MG tablet Take 1 tablet (5 mg total) by mouth daily. 07/31/12   Arie Sabina Schinlever, PA-C  simvastatin (ZOCOR) 20 MG tablet Take 0.5 tablets (10 mg total) by mouth at bedtime. 07/31/12   Otilio Miu, PA-C   Physical Exam: Filed Vitals:   10/02/12 2351 10/02/12 2359 10/03/12 0318  BP:  107/70 131/83  Pulse:  97 97  Temp:  98.1 F (36.7 C)   TempSrc:  Oral   Resp:  16 18  Weight:  70.308 kg (155 lb)   SpO2: 96% 96% 100%    General:  NAD, resting comfortably in bed Eyes: PEERLA EOMI ENT: mucous membranes moist Neck:  supple w/o JVD Cardiovascular: RRR w/o MRG Respiratory: CTA B Abdomen: soft, nt, nd, bs+ Skin: no rash nor lesion Musculoskeletal: MAE, full ROM all 4 extremities Psychiatric: normal tone and affect Neurologic: AAOx3, grossly non-focal  Labs on Admission:  Basic Metabolic Panel:  Recent Labs Lab 10/03/12 0052  NA 129*  K 4.2  CL 91*  CO2 26  GLUCOSE 315*  BUN 16  CREATININE 1.99*  CALCIUM 7.9*   Liver Function Tests:  Recent Labs Lab 10/03/12 0052  AST 197*  ALT 60*  ALKPHOS 232*  BILITOT 2.3*  PROT 6.4  ALBUMIN 2.0*    Recent Labs Lab 10/03/12 0052  LIPASE 6*   No results found for this basename: AMMONIA,  in  the last 168 hours CBC:  Recent Labs Lab 10/03/12 0052  WBC 31.4*  NEUTROABS 29.2*  HGB 11.7*  HCT 32.8*  MCV 94.8  PLT 105*   Cardiac Enzymes: No results found for this basename: CKTOTAL, CKMB, CKMBINDEX, TROPONINI,  in the last 168 hours  BNP (last 3 results) No results found for this basename: PROBNP,  in the last 8760 hours CBG:  Recent Labs Lab 10/03/12 0037 10/03/12 0056 10/03/12 0120 10/03/12 0214 10/03/12 0345  GLUCAP <10* 201* 173* 225* 197*    Radiological Exams on Admission: Dg Cervical Spine Complete  10/03/2012   *RADIOLOGY REPORT*  Clinical Data: Hypoglycemia.  Posterior neck pain.  Fall.  CERVICAL SPINE - COMPLETE 4+ VIEW  Comparison: None.  Findings: Degenerative disc disease.  Large anterior osteophytes at C2-3, C3-4 and C6-7.  Mild degenerative facet disease bilaterally. Mild bilateral neural foraminal narrowing at C4-5.  Normal alignment.  No fracture.  Prevertebral soft tissues are normal.  IMPRESSION: Degenerative changes.  No acute findings.   Original Report Authenticated By: Charlett Nose, M.D.   Dg Lumbar Spine Complete  10/03/2012   *RADIOLOGY REPORT*  Clinical Data: Fall.  LUMBAR SPINE - COMPLETE 4+ VIEW  Comparison: 09/02/2011  Findings: Normal alignment.  Degenerative facet disease in the lower lumbar spine.  No fracture.  SI joints are symmetric and unremarkable.  Calcifications are again noted near the midline of the abdomen compatible with chronic pancreatitis.  IMPRESSION: No acute bony abnormality.  Chronic pancreatitis.   Original Report Authenticated By: Charlett Nose, M.D.    EKG: Independently reviewed.  Assessment/Plan Active Problems:   Leukocytosis   Chronic pancreatitis   AKI (acute kidney injury)   Alcoholic hepatitis   Hypoglycemia secondary to sulfonylurea   1. Leukocytosis - unclear source, no other SIRS criteria, blood cultures ordered, exploring possibility of intra-abdominal process with ultrasound first, if ultrasound  negative then likely needs CT abd/pelvis, possibly MRI of L spine as well although it sounds like his pain there is more chronic.  UA negative.  Could also be reactive to the hepatitis. 2. Alcoholic hepatitis - patient states last drink was just a "couple of beers on Monday" but patient does have extensive history of EtOH abuse in past.  Elevated bilirubin and transaminases in an AST:ALT ratio of greater than 3:1!  After informing the patient of our findings and stating that we were nearly certain that the liver findings were due to EtOH, he admitted that this was "very likely the case".  INR pending to look at disease discriminant but his bilirubin is only mildly elevated at 2. 3. AKI - suspect dehydration due to reduced PO intake, IVF ordered at 125 cc/hr, monitor BMP, checking intake and output as we are keeping patient NPO initially 4.  Pancreatic ductal dilation - non-specific seen on abdominal ultrasound, MRI pancreas ordered as per [place] recommendations (want to avoid CT to avoid need for contrast given AKI). 5. Hypoglycemia - secondary to sulfonylurea, holding home meds, CBG checks Q4H, likely will end up needing insulin later in visit when sulfonylurea wears off.     Code Status: Full Code (must indicate code status--if unknown or must be presumed, indicate so) Family Communication: No (indicate person spoken with, if applicable, with phone number if by telephone) Disposition Plan: Admit to inpatient (indicate anticipated LOS)  Time spent: 70 min  GARDNER, JARED M. [facility] Pager [phone]  If 7PM-7AM, please contact night-coverage [email or web address] Password [email or web address] 10/03/2012, 4:12 AM

## 2012-10-03 NOTE — ED Notes (Signed)
Pt stuck multiple times to gain IV access. IV team at bedside attempting to get a 2nd IV site. Unable to obtain labs. Lab called to come draw.

## 2012-10-03 NOTE — ED Notes (Signed)
Pt off the floor to [place]. Called lab. PT-INR > 2h old. Will need to be redrawn.

## 2012-10-03 NOTE — ED Notes (Addendum)
Dr. [name] made aware of possible delay in obtaining blood cultures d/t pt being hard to obtain blood.  Dr. [name] is ok to wait for fluid infusion to be complete to attempt blood draw.

## 2012-10-03 NOTE — ED Notes (Signed)
Pt given Orange Juice. 

## 2012-10-03 NOTE — Progress Notes (Signed)
Utilization Review completed.  Maddoxx Burkitt RN CM  

## 2012-10-03 NOTE — Progress Notes (Signed)
[facility] PROGRESS NOTE  Rashed Edler ZOX:[id number] DOB: 12/14/1955 DOA: 10/02/2012 PCP: [name], MD  HPI:  Pt is 57 y.o. male who presented to the ED from home with c/o neck and back pain after a fall. He explained this happens occasionally and he is not sure of the reason. He reports occasional alcohol use but not as much as in the past, he is unable to quantify. He has history of chronic pancreatitis but on admission, he has denied abdominal concerns. Please see earlier admission H&P by Dr. [name].   Procedures/Studies: Mr Abdomen Wo Contrast 10/03/2012    1.  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  According to clinical record the patient has a history of chronic pancreatitis.  The focal pancreatic duct dilatation likely is the sequela of chronic pancreatitis and reflects pancreatic duct stricture formation.  No obstructing mass or stone identified.  2.  Fatty infiltration of the liver.   US Abdomen Complete 10/03/2012    Gallstones without sonographic evidence for acute cholecystitis.  Hepatic steatosis.  Nonspecific pancreatic ductal dilatation.  Recommend further evaluation with pancreas protocol CT or MRI.    Pt is hemodynamically stable and denies pain, discussed with GI oncall, recommendation is to repeat CMET in AM and if pt developed abdominal  Pain, may need EUS and can be done outpatient provided pt stops with alcohol use. Continue management for most likely alcoholic hepatitis.  Debbora Presto, MD  [facility] Pager [phone]  If 7PM-7AM, please contact night-coverage [email or web address] Password [email or web address]

## 2012-10-03 NOTE — ED Provider Notes (Signed)
History     CSN: [id number]  Arrival date & time 10/02/12  2351   First MD Initiated Contact with Patient 10/03/12 0002      Chief Complaint  Patient presents with  . Hypoglycemia  . Fall    (Consider location/radiation/quality/duration/timing/severity/associated sxs/prior treatment) HPI 57 year old male presents to the emergency department via EMS from home with complaint of neck and back pain after fall.  Patient reports as he got off the bus yesterday evening.  He fell.  He was unable to get up off the ground after falling.  Then this morning as he was leaning over to get a glass of water, he fell again striking his back on a bedside.  Bandage.  Patient has chronic neck and back pain, and is currently in the process of being seen by pain management.  He has run out of his Vicodin and Percocet.  Patient noted to be hypoglycemic for EMS.  They were unable to get an IV, patient was given glucagon IM. He is on glipizide for his sugars.  Patient does not have a glucometer, and does not remember the last time.  He checked his sugars.  He reports a 30 pound weight loss over the last 6 months.  He reports since a recent change in his vitamins, food does not taste good to him, and so he does not eat often.  He, reports he's had this ticks and french fries to eat today, and that is all.  He denies any symptoms of hypoglycemia.  Patient given juice with sugar, and blood sugar actually dropped further.  Patient denies alcohol abuse.  He reports past history of pancreatitis, and appendicitis.  He denies any abdominal pain, no nausea, vomiting, or diarrhea.  Past Medical History  Diagnosis Date  . Chronic pain   . Arthritis   . Diabetes mellitus without complication   . Hypertension   . Chronic pain     Past Surgical History  Procedure Laterality Date  . Abdominal surgery      No family history on file.  History  Substance Use Topics  . Smoking status: Current Every Day Smoker    Types:  Cigarettes  . Smokeless tobacco: Not on file  . Alcohol Use: Yes      Review of Systems  All other systems reviewed and are negative.    Allergies  Tylenol  Home Medications   Current Outpatient Rx  Name  Route  Sig  Dispense  Refill  . gabapentin (NEURONTIN) 300 MG capsule   Oral   Take 1 capsule (300 mg total) by mouth 3 (three) times daily.   45 capsule   0   . glipiZIDE (GLUCOTROL) 5 MG tablet   Oral   Take 1 tablet (5 mg total) by mouth 2 (two) times daily before a meal.   60 tablet   0   . lisinopril (PRINIVIL,ZESTRIL) 5 MG tablet   Oral   Take 1 tablet (5 mg total) by mouth daily.   30 tablet   0   . simvastatin (ZOCOR) 20 MG tablet   Oral   Take 0.5 tablets (10 mg total) by mouth at bedtime.   30 tablet   0     BP 107/70  Pulse 97  Temp(Src) 98.1 F (36.7 C) (Oral)  Resp 16  Wt 155 lb (70.308 kg)  SpO2 96%  Physical Exam  Nursing note and vitals reviewed. Constitutional: He is oriented to person, place, and time. He appears well-developed and  well-nourished. He appears distressed (Uncomfortable appearing).  HENT:  Head: Normocephalic and atraumatic.  Right Ear: External ear normal.  Left Ear: External ear normal.  Nose: Nose normal.  Mouth/Throat: Oropharynx is clear and moist.  Eyes: Conjunctivae and EOM are normal. Pupils are equal, round, and reactive to light.  Neck: Normal range of motion. Neck supple. No JVD present. No tracheal deviation present. No thyromegaly present.  Cardiovascular: Normal rate, regular rhythm, normal heart sounds and intact distal pulses.  Exam reveals no gallop and no friction rub.   No murmur heard. Pulmonary/Chest: Effort normal and breath sounds normal. No stridor. No respiratory distress. He has no wheezes. He has no rales. He exhibits no tenderness.  Abdominal: Soft. Bowel sounds are normal. He exhibits no distension and no mass. There is tenderness (mild right upper quadrant pain). There is no rebound and  no guarding.  Musculoskeletal: Normal range of motion. He exhibits tenderness (patient with diffuse tenderness to the entire spine.). He exhibits no edema.   No step-off or crepitus noted  Lymphadenopathy:    He has no cervical adenopathy.  Neurological: He is alert and oriented to person, place, and time. He has normal reflexes. No cranial nerve deficit. He exhibits normal muscle tone. Coordination normal.  Skin: Skin is dry. No rash noted. No erythema. No pallor.  Psychiatric: He has a normal mood and affect. His behavior is normal. Judgment and thought content normal.    ED Course  Procedures (including critical care time)  Labs Reviewed  GLUCOSE, CAPILLARY - Abnormal; Notable for the following:    Glucose-Capillary 23 (*)    All other components within normal limits  COMPREHENSIVE METABOLIC PANEL - Abnormal; Notable for the following:    Sodium 129 (*)    Chloride 91 (*)    Glucose, Bld 315 (*)    Creatinine, Ser 1.99 (*)    Calcium 7.9 (*)    Albumin 2.0 (*)    AST 197 (*)    ALT 60 (*)    Alkaline Phosphatase 232 (*)    Total Bilirubin 2.3 (*)    GFR calc non Af Amer 36 (*)    GFR calc Af Amer 41 (*)    All other components within normal limits  CBC WITH DIFFERENTIAL - Abnormal; Notable for the following:    WBC 31.4 (*)    RBC 3.46 (*)    Hemoglobin 11.7 (*)    HCT 32.8 (*)    RDW 17.6 (*)    Platelets 105 (*)    Neutrophils Relative % 93 (*)    Lymphocytes Relative 4 (*)    Neutro Abs 29.2 (*)    All other components within normal limits  GLUCOSE, CAPILLARY - Abnormal; Notable for the following:    Glucose-Capillary <10 (*)    All other components within normal limits  GLUCOSE, CAPILLARY - Abnormal; Notable for the following:    Glucose-Capillary <10 (*)    All other components within normal limits  GLUCOSE, CAPILLARY - Abnormal; Notable for the following:    Glucose-Capillary 201 (*)    All other components within normal limits  GLUCOSE, CAPILLARY -  Abnormal; Notable for the following:    Glucose-Capillary 173 (*)    All other components within normal limits  GLUCOSE, CAPILLARY - Abnormal; Notable for the following:    Glucose-Capillary 225 (*)    All other components within normal limits  LIPASE, BLOOD - Abnormal; Notable for the following:    Lipase 6 (*)  All other components within normal limits  URINALYSIS, ROUTINE W REFLEX MICROSCOPIC - Abnormal; Notable for the following:    Color, Urine ORANGE (*)    APPearance CLOUDY (*)    Glucose, UA 500 (*)    Bilirubin Urine MODERATE (*)    Leukocytes, UA TRACE (*)    All other components within normal limits  GLUCOSE, CAPILLARY - Abnormal; Notable for the following:    Glucose-Capillary 197 (*)    All other components within normal limits  URINE MICROSCOPIC-ADD ON - Abnormal; Notable for the following:    Bacteria, UA FEW (*)    Casts HYALINE CASTS (*)    All other components within normal limits  URINE CULTURE  ETHANOL  PROTIME-INR   Dg Cervical Spine Complete  10/03/2012   *RADIOLOGY REPORT*  Clinical Data: Hypoglycemia.  Posterior neck pain.  Fall.  CERVICAL SPINE - COMPLETE 4+ VIEW  Comparison: None.  Findings: Degenerative disc disease.  Large anterior osteophytes at C2-3, C3-4 and C6-7.  Mild degenerative facet disease bilaterally. Mild bilateral neural foraminal narrowing at C4-5.  Normal alignment.  No fracture.  Prevertebral soft tissues are normal.  IMPRESSION: Degenerative changes.  No acute findings.   Original Report Authenticated By: Charlett Nose, M.D.   Dg Lumbar Spine Complete  10/03/2012   *RADIOLOGY REPORT*  Clinical Data: Fall.  LUMBAR SPINE - COMPLETE 4+ VIEW  Comparison: 09/02/2011  Findings: Normal alignment.  Degenerative facet disease in the lower lumbar spine.  No fracture.  SI joints are symmetric and unremarkable.  Calcifications are again noted near the midline of the abdomen compatible with chronic pancreatitis.  IMPRESSION: No acute bony abnormality.   Chronic pancreatitis.   Original Report Authenticated By: Charlett Nose, M.D.     1. Leukocytosis   2. Chronic pancreatitis   3. Hypoglycemia secondary to sulfonylurea, initial encounter   4. Transaminitis   5. Hyponatremia   6. Chronic pain   7. Fall, initial encounter       MDM  57 year old man with 2 recent falls found to be severely hypoglycemic here.  I question of falls were secondary to his low blood sugar.  It is unclear how long his blood sugar has been as low.  He has multiple abnormalities in his lab values.  He has a leukocytosis of 31.4.  He has had an acute drop in his hemoglobin and hematocrit from his visit in March.  He has thrombocytopenia.  Patient noted to have new renal insufficiency, as well as elevated liver enzymes, and bilirubin.  Area.  Patient is also hyponatremic.  It is unclear at the time what is causing this.  He does have some mild right upper quadrant pain.  He denies previous history of hepatitis.  Will get right upper quadrant ultrasound and discuss with hospitalist for admission.        Olivia Mackie, MD 10/03/12 [phone]

## 2012-10-04 ENCOUNTER — Inpatient Hospital Stay (HOSPITAL_COMMUNITY): Payer: Medicaid Other

## 2012-10-04 DIAGNOSIS — N390 Urinary tract infection, site not specified: Secondary | ICD-10-CM

## 2012-10-04 DIAGNOSIS — K861 Other chronic pancreatitis: Secondary | ICD-10-CM

## 2012-10-04 DIAGNOSIS — N179 Acute kidney failure, unspecified: Secondary | ICD-10-CM

## 2012-10-04 DIAGNOSIS — A419 Sepsis, unspecified organism: Secondary | ICD-10-CM

## 2012-10-04 DIAGNOSIS — K7010 Alcoholic hepatitis without ascites: Secondary | ICD-10-CM

## 2012-10-04 DIAGNOSIS — D72829 Elevated white blood cell count, unspecified: Secondary | ICD-10-CM

## 2012-10-04 DIAGNOSIS — R6521 Severe sepsis with septic shock: Secondary | ICD-10-CM

## 2012-10-04 DIAGNOSIS — T383X1A Poisoning by insulin and oral hypoglycemic [antidiabetic] drugs, accidental (unintentional), initial encounter: Secondary | ICD-10-CM

## 2012-10-04 LAB — COMPREHENSIVE METABOLIC PANEL
Albumin: 1.8 g/dL — ABNORMAL LOW (ref 3.5–5.2)
Alkaline Phosphatase: 208 U/L — ABNORMAL HIGH (ref 39–117)
BUN: 11 mg/dL (ref 6–23)
CO2: 22 mEq/L (ref 19–32)
CO2: 24 mEq/L (ref 19–32)
Calcium: 7.7 mg/dL — ABNORMAL LOW (ref 8.4–10.5)
Chloride: 93 mEq/L — ABNORMAL LOW (ref 96–112)
Creatinine, Ser: 1.1 mg/dL (ref 0.50–1.35)
GFR calc Af Amer: 84 mL/min — ABNORMAL LOW (ref >90)
GFR calc Af Amer: 78 mL/min — ABNORMAL LOW (ref >90)
GFR calc non Af Amer: 73 mL/min — ABNORMAL LOW (ref >90)
GFR calc non Af Amer: 68 mL/min — ABNORMAL LOW (ref >90)
Glucose, Bld: 148 mg/dL — ABNORMAL HIGH (ref 70–99)
Glucose, Bld: 186 mg/dL — ABNORMAL HIGH (ref 70–99)
Potassium: 3.6 mEq/L (ref 3.5–5.1)
Total Bilirubin: 3.1 mg/dL — ABNORMAL HIGH (ref 0.3–1.2)
Total Protein: 5.9 g/dL — ABNORMAL LOW (ref 6.0–8.3)

## 2012-10-04 LAB — CBC
Hemoglobin: 10.9 g/dL — ABNORMAL LOW (ref 13.0–17.0)
RBC: 3.33 MIL/uL — ABNORMAL LOW (ref 4.22–5.81)

## 2012-10-04 LAB — BLOOD GAS, ARTERIAL
Bicarbonate: 23.2 mEq/L (ref 20.0–24.0)
FIO2: 0.21 %
O2 Saturation: 96.8 %
Patient temperature: 98.6

## 2012-10-04 LAB — URINALYSIS, ROUTINE W REFLEX MICROSCOPIC
Glucose, UA: NEGATIVE mg/dL
Ketones, ur: NEGATIVE mg/dL
Nitrite: POSITIVE — AB
Protein, ur: 30 mg/dL — AB
Specific Gravity, Urine: 1.02 (ref 1.005–1.030)
Urobilinogen, UA: 1 mg/dL (ref 0.0–1.0)
pH: 6.5 (ref 5.0–8.0)

## 2012-10-04 LAB — GLUCOSE, CAPILLARY
Glucose-Capillary: 105 mg/dL — ABNORMAL HIGH (ref 70–99)
Glucose-Capillary: 159 mg/dL — ABNORMAL HIGH (ref 70–99)
Glucose-Capillary: 184 mg/dL — ABNORMAL HIGH (ref 70–99)
Glucose-Capillary: 63 mg/dL — ABNORMAL LOW (ref 70–99)
Glucose-Capillary: 88 mg/dL (ref 70–99)

## 2012-10-04 LAB — TROPONIN I: Troponin I: <0.3 ng/mL (ref <0.30)

## 2012-10-04 LAB — CARBOXYHEMOGLOBIN
Carboxyhemoglobin: 1.2 % (ref 0.5–1.5)
Methemoglobin: 1.2 % (ref 0.0–1.5)
O2 Saturation: 56.4 %
Total hemoglobin: 9 g/dL — ABNORMAL LOW (ref 13.5–18.0)
Total hemoglobin: 9.2 g/dL — ABNORMAL LOW (ref 13.5–18.0)

## 2012-10-04 LAB — URINE MICROSCOPIC-ADD ON

## 2012-10-04 LAB — LACTIC ACID, PLASMA
Lactic Acid, Venous: 6.2 mmol/L — ABNORMAL HIGH (ref 0.5–2.2)
Lactic Acid, Venous: 2.8 mmol/L — ABNORMAL HIGH (ref 0.5–2.2)

## 2012-10-04 LAB — URINE CULTURE

## 2012-10-04 LAB — APTT: aPTT: 62 seconds — ABNORMAL HIGH (ref 24–37)

## 2012-10-04 LAB — FIBRINOGEN: Fibrinogen: 309 mg/dL (ref 204–475)

## 2012-10-04 LAB — TYPE AND SCREEN

## 2012-10-04 MED ORDER — LORAZEPAM 2 MG/ML IJ SOLN
1.0000 mg | Freq: Four times a day (QID) | INTRAMUSCULAR | Status: AC | PRN
  Filled 2012-10-04: qty 1

## 2012-10-04 MED ORDER — SODIUM CHLORIDE 0.9 % IV BOLUS (SEPSIS): 1000.0000 mL | INTRAVENOUS | Status: DC | PRN

## 2012-10-04 MED ORDER — LORAZEPAM 1 MG PO TABS: 1.0000 mg | ORAL_TABLET | Freq: Four times a day (QID) | ORAL | Status: AC | PRN

## 2012-10-04 MED ORDER — POTASSIUM CHLORIDE 10 MEQ/50ML IV SOLN
10.0000 meq | INTRAVENOUS | Status: AC
  Administered 2012-10-04 (×2): 10 meq via INTRAVENOUS
  Filled 2012-10-04 (×2): qty 50

## 2012-10-04 MED ORDER — VANCOMYCIN HCL IN DEXTROSE 750-5 MG/150ML-% IV SOLN
750.0000 mg | Freq: Two times a day (BID) | INTRAVENOUS | Status: DC
  Administered 2012-10-04 – 2012-10-05 (×2): 750 mg via INTRAVENOUS
  Filled 2012-10-04 (×2): qty 150

## 2012-10-04 MED ORDER — LEVOFLOXACIN IN D5W 750 MG/150ML IV SOLN
750.0000 mg | INTRAVENOUS | Status: DC
  Administered 2012-10-05: 750 mg via INTRAVENOUS
  Filled 2012-10-04: qty 150

## 2012-10-04 MED ORDER — ACETAMINOPHEN 10 MG/ML IV SOLN
1000.0000 mg | Freq: Four times a day (QID) | INTRAVENOUS | Status: DC
  Administered 2012-10-04 (×2): 1000 mg via INTRAVENOUS
  Filled 2012-10-04 (×4): qty 100

## 2012-10-04 MED ORDER — FENTANYL CITRATE 0.05 MG/ML IJ SOLN
25.0000 ug | INTRAMUSCULAR | Status: DC | PRN
  Administered 2012-10-04 (×2): 50 ug via INTRAVENOUS
  Administered 2012-10-04: 75 ug via INTRAVENOUS
  Administered 2012-10-05 – 2012-10-07 (×11): 100 ug via INTRAVENOUS
  Filled 2012-10-04 (×14): qty 2

## 2012-10-04 MED ORDER — FOLIC ACID 1 MG PO TABS
1.0000 mg | ORAL_TABLET | Freq: Every day | ORAL | Status: DC
  Administered 2012-10-04 – 2012-10-08 (×5): 1 mg via ORAL
  Filled 2012-10-04 (×5): qty 1

## 2012-10-04 MED ORDER — IBUPROFEN 800 MG PO TABS
800.0000 mg | ORAL_TABLET | Freq: Once | ORAL | Status: AC
  Administered 2012-10-04: 800 mg via ORAL
  Filled 2012-10-04: qty 1

## 2012-10-04 MED ORDER — NICOTINE 21 MG/24HR TD PT24
21.0000 mg | MEDICATED_PATCH | Freq: Every day | TRANSDERMAL | Status: DC
  Administered 2012-10-04 – 2012-10-08 (×5): 21 mg via TRANSDERMAL
  Filled 2012-10-04 (×5): qty 1

## 2012-10-04 MED ORDER — SODIUM CHLORIDE 0.9 % IV BOLUS (SEPSIS)
1000.0000 mL | INTRAVENOUS | Status: DC | PRN
  Administered 2012-10-04: 1000 mL via INTRAVENOUS

## 2012-10-04 MED ORDER — IOHEXOL 300 MG/ML  SOLN
100.0000 mL | Freq: Once | INTRAMUSCULAR | Status: AC | PRN
  Administered 2012-10-04: 100 mL via INTRAVENOUS

## 2012-10-04 MED ORDER — DOBUTAMINE IN D5W 4-5 MG/ML-% IV SOLN
2.5000 ug/kg/min | INTRAVENOUS | Status: DC
  Administered 2012-10-04: 2.5 ug/kg/min via INTRAVENOUS

## 2012-10-04 MED ORDER — SODIUM CHLORIDE 0.9 % IV SOLN
INTRAVENOUS | Status: DC
  Administered 2012-10-04: 16:00:00 via INTRAVENOUS

## 2012-10-04 MED ORDER — PIPERACILLIN-TAZOBACTAM 3.375 G IVPB
3.3750 g | Freq: Three times a day (TID) | INTRAVENOUS | Status: DC
  Administered 2012-10-04 – 2012-10-06 (×7): 3.375 g via INTRAVENOUS
  Filled 2012-10-04 (×9): qty 50

## 2012-10-04 MED ORDER — PIPERACILLIN-TAZOBACTAM 3.375 G IVPB 30 MIN
3.3750 g | Freq: Once | INTRAVENOUS | Status: AC
  Administered 2012-10-04: 3.375 g via INTRAVENOUS
  Filled 2012-10-04: qty 50

## 2012-10-04 MED ORDER — DEXTROSE-NACL 5-0.45 % IV SOLN
INTRAVENOUS | Status: DC
  Administered 2012-10-04: 22:00:00 via INTRAVENOUS

## 2012-10-04 MED ORDER — ADULT MULTIVITAMIN W/MINERALS CH: 1.0000 | ORAL_TABLET | Freq: Every day | ORAL | Status: DC

## 2012-10-04 MED ORDER — SODIUM CHLORIDE 0.9 % IV BOLUS (SEPSIS)
500.0000 mL | Freq: Once | INTRAVENOUS | Status: AC
  Administered 2012-10-04: 500 mL via INTRAVENOUS

## 2012-10-04 MED ORDER — DEXTROSE 50 % IV SOLN
25.0000 mL | Freq: Once | INTRAVENOUS | Status: AC
  Administered 2012-10-04: 25 mL via INTRAVENOUS
  Filled 2012-10-04: qty 50

## 2012-10-04 MED ORDER — LEVOFLOXACIN IN D5W 750 MG/150ML IV SOLN
750.0000 mg | INTRAVENOUS | Status: DC
  Administered 2012-10-04: 750 mg via INTRAVENOUS
  Filled 2012-10-04: qty 150

## 2012-10-04 MED ORDER — IOHEXOL 300 MG/ML  SOLN
25.0000 mL | INTRAMUSCULAR | Status: AC
  Administered 2012-10-04 (×2): 25 mL via INTRAVENOUS

## 2012-10-04 MED ORDER — NOREPINEPHRINE BITARTRATE 1 MG/ML IJ SOLN
2.0000 ug/min | INTRAVENOUS | Status: DC
  Administered 2012-10-04: 5 ug/min via INTRAVENOUS
  Filled 2012-10-04: qty 4

## 2012-10-04 MED ORDER — DOBUTAMINE IN D5W 4-5 MG/ML-% IV SOLN
INTRAVENOUS | Status: AC
  Administered 2012-10-04: 2.5 ug/kg/min
  Filled 2012-10-04: qty 250

## 2012-10-04 MED ORDER — VITAMIN B-1 100 MG PO TABS
100.0000 mg | ORAL_TABLET | Freq: Every day | ORAL | Status: DC
  Administered 2012-10-04 – 2012-10-08 (×5): 100 mg via ORAL
  Filled 2012-10-04 (×5): qty 1

## 2012-10-04 MED ORDER — THIAMINE HCL 100 MG/ML IJ SOLN
100.0000 mg | Freq: Every day | INTRAMUSCULAR | Status: DC
  Filled 2012-10-04 (×5): qty 1

## 2012-10-04 MED ORDER — SODIUM CHLORIDE 0.9 % IV BOLUS (SEPSIS)
250.0000 mL | Freq: Once | INTRAVENOUS | Status: AC
  Administered 2012-10-04: 250 mL via INTRAVENOUS

## 2012-10-04 MED ORDER — VANCOMYCIN HCL IN DEXTROSE 1-5 GM/200ML-% IV SOLN
1000.0000 mg | INTRAVENOUS | Status: DC
  Administered 2012-10-04: 1000 mg via INTRAVENOUS
  Filled 2012-10-04: qty 200

## 2012-10-04 NOTE — Progress Notes (Signed)
Event: Notified by RN that pt w/ rectal temp of 103, rigors, somewhat lethargic and HR in 170's. EKG ordered. NP to bedside. Subjective: Pt unable to provide information d/t altered mental status. Objective: Dennis Zhang is a 57 y/o male who presented to the ED from home on 10/03/12 with c/o neck and back pain after a fall. Patient got off of the bus the day before and fell. Since the fall his chronic neck and back pain have been worse. The morning prior to admission he was leaning over to get a glass of water, and again fell striking his back on the bedside. He is soon to be seen by pain management he says but has run out of his Vicodin and percocet. EMS noted that the patient was markedly hypoglycemic (he is on glipizide for sugars). Patient reports he has lost 30 lbs over the past 6 months, although he states he only drinks "occasionally" he does have a previous history of extensive EtOH use. He also has a PMH of chronic pancreatitis but states his abdomen isnt really bothering him today. In the ED in addition to an undetectable BGL level initially (patient conscious, talking, and other than his chronic back pain was asymptomatic despite this which was confirmed on repeat testing). Other lab abnormalities include a WBC of 31k (no other SIRS criteria at time of admission), transaminitis and elevated bilirubin in a pattern worrisome for ETOH hepatitis. AKI with a creatinine of 1.99 (baseline <1). Hyponatremia, normocytic anemia, and platelets. U/a on admission revealed trace leukocytes (3-6). At bedside pt noted very lethargic but responsive. RN's have placed ice packs to bil groin and axilla. CBG 101. Current VS T-103 (rectal), BP-88/62, P-132, R-22 02 sats 95% on r/a. EKG reveals a ST w/ rate in 150's. U/a repeated after temp of 103 revealed large leukocytes and positive nitrites. PCXR reveals (L) mid lobe opacity (atelectasis vs infiltrate). Pt had received Ibuprofen x 1 earlier this am for Temp of 99.9.  Now receiving Ofirmev 1GM IV and 500cc IVF bolus. CBC, CMP, lactic acid and procalcitonin pending. Assessment/Plan: 1. Sepsis: (SIRS + Source) Grossly positive u/a, blood cx's (from 10/03/12) positive for gram negative rods, (L) mid-lobe infiltrate. Pt started on Zosyn earlier tonight after blood cx's reported. Levaquin and Vancomycin have been added. Lactic acid and procalcitonin pending. 2. Tachycardia: Likely d/t #1 improving w/ IVF's. 3.  Hypotension: Likely d/t #1, improving w/ IVF's. 4. Delirium: Sepsis vs Alcohol withdrawal (Pt has been placed on CIWA and Ativan Protocol). Pt currently much more alert but remains disoriented and impulsive. Pt has been transferred to SDU. I have discussed pt with Dr [name] who has also seen pt at bedside and is in agreement w/ plan. Will continue to monitor closely.   Leanne Chang, NP-C [facility] Pager [phone]

## 2012-10-04 NOTE — Procedures (Signed)
Central Venous Catheter Insertion Procedure Note Dennis Zhang [id number] 1956-04-22  Procedure: Insertion of Central Venous Catheter Indications: Assessment of intravascular volume and Drug and/or fluid administration  Procedure Details Consent: Risks of procedure as well as the alternatives and risks of each were explained to the (patient/caregiver).  Consent for procedure obtained. Time Out: Verified patient identification, verified procedure, site/side was marked, verified correct patient position, special equipment/implants available, medications/allergies/relevent history reviewed, required imaging and test results available.  Performed  Maximum sterile technique was used including antiseptics, cap, gloves, gown, hand hygiene, mask and sheet. Skin prep: Chlorhexidine; local anesthetic administered A antimicrobial bonded/coated triple lumen catheter was placed in the left internal jugular vein using the Seldinger technique.  Ultrasound was used to verify the patency of the vein and for real time needle guidance.  Evaluation Blood flow good Complications: No apparent complications Patient did tolerate procedure well. Chest X-ray ordered to verify placement.  CXR: pending.  Dennis Zhang 10/04/2012, 7:40 AM

## 2012-10-04 NOTE — Progress Notes (Signed)
Arrived in pt's room to  see him shaking uncontrollably w/ pulse sustaining in 170's.Temp increased to 100.7  And pt becoming hypotensive. 0430 CBG 101. Pt has increased confusion and alert now only to self. Rapid response and hospitalist K. Schorr called and arrived w/ new orders. CXR , labs and urine culture done. Pt has been xferred to 1223 at  0520. Report given to Marisue Ivan. Ms. Abran Cantor (sister) notified of transfer.

## 2012-10-04 NOTE — Progress Notes (Signed)
Pharmacy: Brief Abx Note  Based on improved Scr 1.1 (CrCl 69 ml/min) Will increase Vancomycin to 750 mg IV q12, Levaquin 750 mg IV Q24h, and continue Zosyn extended infusion.    Will continue to follow  Brittne Kawasaki, Loma Messing PharmD Pager #: [phone] 9:19 AM 10/04/2012

## 2012-10-04 NOTE — Progress Notes (Signed)
Gram (-) stain report called in from [facility] for 2 + blood cultures, attending notified.

## 2012-10-04 NOTE — Consult Note (Signed)
PULMONARY  / CRITICAL CARE MEDICINE  Name: Dennis Zhang MRN: [id number] DOB: 13-Oct-1955    ADMISSION DATE:  10/02/2012 CONSULTATION DATE:  10/03/2012   REFERRING MD :  [name] PRIMARY SERVICE: TRH  CHIEF COMPLAINT:  Shock, abd pain  BRIEF PATIENT DESCRIPTION: 57 yo alcoholic, hx chronic pancreatitis, DM, HTN, chronic pain. Presented w back pain and hypoglycemia. Admitted 5/15 w apparent EtOH hepatitis, acute renal insufficiency, leukocytosis. Progressed to septic shock, source GNR bacteremia. To ICU am 5/16   SIGNIFICANT EVENTS / STUDIES:  MRI abd 5/15>> focal ectasia pancreatic duct at head and neck, chronic pancreatitis, no stone or obstructing lesion  LINES / TUBES: L IJ CVC 5/16 >>   CULTURES:  Blood 5/16 >> GNR >>  Urine 5/16 >>   ANTIBIOTICS: Vanco 5/15 >>  Zosyn 5/15 >>  levaquin 5/15 >>   HISTORY OF PRESENT ILLNESS:  57 yo alcoholic, hx chronic pancreatitis, DM, HTN, chronic pain. Presented w back pain and hypoglycemia. Admitted 5/15 w apparent EtOH hepatitis, acute renal insufficiency, leukocytosis. Progressed to septic shock, source GNR bacteremia. To ICU am 5/16 due to profound hypotension and tachycardia. He is complaining of back pain again, some abdominal pain.  He is confused.  PAST MEDICAL HISTORY :  Past Medical History  Diagnosis Date  . Chronic pain   . Arthritis   . Diabetes mellitus without complication   . Hypertension   . Chronic pain    Past Surgical History  Procedure Laterality Date  . Abdominal surgery     Prior to Admission medications   Medication Sig Start Date End Date Taking? Authorizing Provider  ALPRAZolam [name]) 1 MG tablet Take 1 mg by mouth 2 (two) times daily as needed for anxiety.   Yes Historical Provider, MD  cyclobenzaprine (FLEXERIL) 5 MG tablet Take 5 mg by mouth 3 (three) times daily.   Yes Historical Provider, MD  gabapentin (NEURONTIN) 300 MG capsule Take 1 capsule (300 mg total) by mouth 3 (three) times daily. 07/31/12   Yes [name], PA-C  glipiZIDE (GLUCOTROL) 5 MG tablet Take 1 tablet (5 mg total) by mouth 2 (two) times daily before a meal. 07/31/12  Yes [name], PA-C  lisinopril (PRINIVIL,ZESTRIL) 5 MG tablet Take 1 tablet (5 mg total) by mouth daily. 07/31/12  Yes [name], PA-C  omeprazole (PRILOSEC) 20 MG capsule Take 20 mg by mouth daily.   Yes Historical Provider, MD  simvastatin (ZOCOR) 20 MG tablet Take 0.5 tablets (10 mg total) by mouth at bedtime. 07/31/12  Yes [name], PA-C  traZODone (DESYREL) 150 MG tablet Take 75 mg by mouth at bedtime.   Yes Historical Provider, MD   Allergies  Allergen Reactions  . Tylenol (Acetaminophen) Nausea Only    States can take Tylenol if has other pain med w/it - like Hydrocodone    FAMILY HISTORY:  No family history on file. SOCIAL HISTORY:  reports that he has been smoking Cigarettes.  He has been smoking about 0.00 packs per day. He does not have any smokeless tobacco history on file. He reports that  drinks alcohol. He reports that he does not use illicit drugs.  REVIEW OF SYSTEMS:  C/o back pain, abd pain, N/V; all other systems negative  SUBJECTIVE:   VITAL SIGNS: Temp:  [97.5 F (36.4 C)-103.7 F (39.8 C)] 101.2 F (38.4 C) (05/16 0600) Pulse Rate:  [99-170] 120 (05/16 0530) Resp:  [14-24] 21 (05/16 0530) BP: (84-148)/(57-101) 132/75 mmHg (05/16 0530) SpO2:  [  94 %-100 %] 94 % (05/16 0515) HEMODYNAMICS:   VENTILATOR SETTINGS:   INTAKE / OUTPUT: Intake/Output     05/15 0701 - 05/16 0700 05/16 0701 - 05/17 0700   P.O. 120    I.V. (mL/kg)     Total Intake(mL/kg) 120 (1.8)    Net +120          Urine Occurrence 675 x      PHYSICAL EXAMINATION:  Gen: confused HEENT: NCAT, PERRL, EOMi, OP with thrush throughout PULM: CTA B CV: tachy, regular, no mgr, no JVD AB: BS infrequent, tenderness, guarding RUQ/epigastrum; L CVA tenderness Ext: warm, no edema, no clubbing, no cyanosis Derm: no  rash or skin breakdown Neuro: Awake, oriented to place/year, follows commands, cn ii-xii intact, maew   LABS:  Recent Labs Lab 10/03/12 0052 10/03/12 0615 10/04/12 0510 10/04/12 0650  HGB 11.7*  --  10.9*  --   WBC 31.4*  --  2.5*  --   PLT 105*  --  PLATELET CLUMPS NOTED ON SMEAR, COUNT APPEARS ADEQUATE  --   NA 129*  --  129*  --   K 4.2  --  4.1  --   CL 91*  --  93*  --   CO2 26  --  22  --   GLUCOSE 315*  --  148*  --   BUN 16  --  10  --   CREATININE 1.99*  --  1.10  --   CALCIUM 7.9*  --  7.7*  --   AST 197*  --  125*  --   ALT 60*  --  47  --   ALKPHOS 232*  --  281*  --   BILITOT 2.3*  --  3.2*  --   PROT 6.4  --  5.9*  --   ALBUMIN 2.0*  --  1.9*  --   INR  --  1.34  --   --   LATICACIDVEN  --   --  6.2*  --   PROCALCITON  --   --  9.08  --   PHART  --   --   --  7.458*  PCO2ART  --   --   --  33.2*  PO2ART  --   --   --  83.2    Recent Labs Lab 10/03/12 2135 10/03/12 2230 10/04/12 0031 10/04/12 0604 10/04/12 0656  GLUCAP 178* 145* 159* 112* 133*    CXR: CVL in place, lungs clear, cardiac silhouette normal  ASSESSMENT / PLAN:  PULMONARY A: No acute issues P:   -monitor O2 saturation  CARDIOVASCULAR A: Septic shock P:  -CVL placed -Sepsis protocol -monitor CVP -levophed  RENAL A:  AKI resolved with IVF P:   -continue volume resuscitation -repeat CMET  GASTROINTESTINAL A:  Abdominal pain, tender abdomen and GNR bacteremia; Very rapid change after MRI abdomen yesterday, so worried about new biliary disease vs pyelo, see discussion below Chronic pancreatitis by history and MRI abdomen Alcoholic hepatitis/fatty liver  P:   -CT abdomen now -npo  HEMATOLOGIC A:  Leukopenia from sepsis/alcohol abuse Thrombocytopenia P:  -monitor for bleeding -daily CBC  INFECTIOUS A:  Septic shock with GNR bacteremia, unclear source; ddx pyelonephritis given urinalysis/CVA tenderness, nephrolithiasis?, possibly biliary source given severe  abdominal pain P: -CT abdomen now -zosyn/levaquin -d/c vanc  ENDOCRINE A:  Hypoglycemia in setting of sepsis, likely alcoholic hepatitis DM2 P:   -monitor glucose -D5  NEUROLOGIC A:  Acute encephalopathy from sepsis and possible EtOH  withdrawal P:   -MVI/thiamine/folate daily -change dilaudid to fentanyl -prn ativan per CIWA  TODAY'S SUMMARY: 57 y/o male with EtOH abuse, chronic pancreatitis now with septic shock and GNR bacteremia from possible pyelonephritis vs biliary source.  CT abdomen pending, abx appropriate, ct abdomen pending.  I have personally obtained a history, examined the patient, evaluated laboratory and imaging results, formulated the assessment and plan and placed orders. CRITICAL CARE: The patient is critically ill with multiple organ systems failure and requires high complexity decision making for assessment and support, frequent evaluation and titration of therapies, application of advanced monitoring technologies and extensive interpretation of multiple databases. Critical Care Time devoted to patient care services described in this note is  60 minutes.    Pulmonary and Critical Care Medicine [facility] Pager: [phone]  10/04/2012, 7:12 AM

## 2012-10-04 NOTE — Progress Notes (Signed)
eLink Physician-Brief Progress Note Patient Name: Dennis Zhang DOB: Sep 20, 1955 MRN: [id number]  Date of Service  10/04/2012   HPI/Events of Note   RN reports CBG 29 and 32 (checked on L and R hand); concerned results are erroneous.  CVL sambled - BG 64.   eICU Interventions   D50 25 mL Change IVF to D5 1/2 NS @ 100 Check BG on CVL samples q4h    Intervention Category Major Interventions: Other:Hypoglycemia  Lonia Farber 10/04/2012, 9:24 PM

## 2012-10-04 NOTE — Progress Notes (Addendum)
ANTIBIOTIC CONSULT NOTE - INITIAL  Pharmacy Consult for zosyn/levofloxacin/vancomycin Indication: blood cx's + for gram - rods/ LLL infiltrate   Allergies  Allergen Reactions  . Tylenol (Acetaminophen) Nausea Only    States can take Tylenol if has other pain med w/it - like Hydrocodone    Patient Measurements: Height: 6\' 1"  (185.4 cm) Weight: 146 lb 11.2 oz (66.543 kg) IBW/kg (Calculated) : 79.9 Adjusted Body Weight:   Vital Signs: Temp: 97.5 F (36.4 C) (05/15 2038) Temp src: Oral (05/15 2038) BP: 148/101 mmHg (05/15 2038) Pulse Rate: 115 (05/15 2038) Intake/Output from previous day: 05/15 0701 - 05/16 0700 In: 120 [P.O.:120] Out: -  Intake/Output from this shift:    Labs:  Recent Labs  10/03/12 0052  WBC 31.4*  HGB 11.7*  PLT 105*  CREATININE 1.99*   Estimated Creatinine Clearance: 38.5 ml/min (by C-G formula based on Cr of 1.99). No results found for this basename: VANCOTROUGH, VANCOPEAK, VANCORANDOM, GENTTROUGH, GENTPEAK, GENTRANDOM, TOBRATROUGH, TOBRAPEAK, TOBRARND, AMIKACINPEAK, AMIKACINTROU, AMIKACIN,  in the last 72 hours   Microbiology: Recent Results (from the past 720 hour(s))  CULTURE, BLOOD (ROUTINE X 2)     Status: None   Collection Time    10/03/12  6:10 AM      Result Value Range Status   Specimen Description BLOOD RIGHT ANTECUBITAL   Final   Special Requests BOTTLES DRAWN AEROBIC ONLY 3CC   Final   Culture  Setup Time 10/03/2012 09:28   Final   Culture     Final   Value: GRAM NEGATIVE RODS     Note: Gram Stain Report Called to,Read Back By and Verified With: VERA JACKSON ON 10/04/2012 AT 12:20A BY WILEJ   Report Status PENDING   Incomplete  CULTURE, BLOOD (ROUTINE X 2)     Status: None   Collection Time    10/03/12  6:15 AM      Result Value Range Status   Specimen Description BLOOD LEFT HAND   Final   Special Requests BOTTLES DRAWN AEROBIC ONLY 4CC   Final   Culture  Setup Time 10/03/2012 09:28   Final   Culture     Final   Value: GRAM  NEGATIVE RODS     Note: Gram Stain Report Called to,Read Back By and Verified With: VERA JACKSON ON 10/04/2012 AT 12:20A BY WILEJ   Report Status PENDING   Incomplete    Medical History: Past Medical History  Diagnosis Date  . Chronic pain   . Arthritis   . Diabetes mellitus without complication   . Hypertension   . Chronic pain     Medications:  Anti-infectives   Start     Dose/Rate Route Frequency Ordered Stop   10/04/12 0800  piperacillin-tazobactam (ZOSYN) IVPB 3.375 g     3.375 g 12.5 mL/hr over 240 Minutes Intravenous Every 8 hours 10/04/12 0119     10/04/12 0130  piperacillin-tazobactam (ZOSYN) IVPB 3.375 g     3.375 g 100 mL/hr over 30 Minutes Intravenous  Once 10/04/12 0119       Assessment: Patient with blood cx's + for gram - rods.    Goal of Therapy:  Zosyn based on renal function  Levofloxacin dosed based on patient weight and renal function Vancomycin level 15-20     Plan:  Zosyn 3.375g IV Q8H infused over 4hrs. After x1 dose over Levofloxacin 750mg  iv q48hr Vancomycin 1gm iv q24hr  Darlina Guys, Reilley Latorre Crowford 10/04/2012,1:21 AM

## 2012-10-04 NOTE — Progress Notes (Signed)
CARE MANAGEMENT NOTE 10/04/2012  Patient:  Dennis Zhang, Dennis Zhang   Account Number:  [id number]  Date Initiated:  10/04/2012  Documentation initiated by:  DAVIS,RHONDA  Subjective/Objective Assessment:   pt transferred to icu due to increased confusion, temp, and poss sepsis versus etoph withdrawal or both.     Action/Plan:   is from home lives alone but does have support group   Anticipated DC Date:  10/07/2012   Anticipated DC Plan:  HOME/SELF CARE  In-house referral  NA      DC Planning Services  NA      [facility] Choice  NA   Choice offered to / List presented to:  NA   DME arranged  NA      DME agency  NA     HH arranged  NA      HH agency   Status of service:  In process, will continue to follow Medicare Important Message given?  NA - LOS <3 / Initial given by admissions (If response is "NO", the following Medicare IM given date fields will be blank) Date Medicare IM given:   Date Additional Medicare IM given:    Discharge Disposition:    Per UR Regulation:  Reviewed for med. necessity/level of care/duration of stay  If discussed at Long Length of Stay Meetings, dates discussed:    Comments:  [id number] Earlene Plater, RN, BSN, CCM:  CHART REVIEWED AND UPDATED.  Next chart review due on [id number]. NO DISCHARGE NEEDS PRESENT AT THIS TIME. CASE MANAGEMENT [phone]

## 2012-10-04 NOTE — Progress Notes (Addendum)
Pts temp at 99.9. Attending, K. Schorr notified, new orders given, cool compresses applied, will continue to monitor and provide interventions as appropriate

## 2012-10-04 NOTE — Progress Notes (Signed)
Called to room 1433 per Dwana Curd RN, pt with elevated fever HR 170s, and confusion. Upon my arrival pt found in bed with intense rigors. Pt felt very hot to touch, however last oral temp recorded was 99. Rectal temp obtained yielding 103.7. Pt lethargic, minimally responsive to voice initially at 0415. Lung sounds clear. Cooling measures started with ice compresses and fan obtained. Ekg done, see results in [facility].  NP Tama Gander called to room. Upon her arrival Tylenol ordered IV. Medication obtained per pharmacy and infused. Pt Bp trended down. NS bolus ordered and infused. Orders received per NP to transfer to to SD for closer observation. As of 0515 pt more alert, confused, oriented to name only. Requesting to get oob frequently and asking for cigarettes. Pt transferred to room 1223 at 0530.

## 2012-10-04 NOTE — Progress Notes (Signed)
PULMONARY  / CRITICAL CARE MEDICINE  Name: Dennis Zhang MRN: [id number] DOB: 1955-06-26    ADMISSION DATE:  10/02/2012 CONSULTATION DATE:  10/03/2012   REFERRING MD :  [name] PRIMARY SERVICE: TRH  CHIEF COMPLAINT:  Shock, abd pain  BRIEF PATIENT DESCRIPTION: 57 yo alcoholic, hx chronic pancreatitis, DM, HTN, chronic pain. Presented w back pain and hypoglycemia. Admitted 5/15 w apparent EtOH hepatitis, acute renal insufficiency, leukocytosis. Progressed to septic shock, source GNR bacteremia. To ICU am 5/16   SIGNIFICANT EVENTS / STUDIES:  MRI abd 5/15>> focal ectasia pancreatic duct at head and neck, chronic pancreatitis, no stone or obstructing lesion  LINES / TUBES: L IJ CVC 5/16 >>   CULTURES:  Blood 5/16 >> GNR >>  Urine 5/16 >>   ANTIBIOTICS: Vanco 5/15 >>  Zosyn 5/15 >>  levaquin 5/15 >>   SUBJECTIVE: C/O pain.  VITAL SIGNS: Temp:  [97.5 F (36.4 C)-103.7 F (39.8 C)] 98.2 F (36.8 C) (05/16 0800) Pulse Rate:  [65-170] 117 (05/16 0800) Resp:  [13-24] 18 (05/16 0815) BP: (62-148)/(42-101) 85/65 mmHg (05/16 0815) SpO2:  [90 %-100 %] 90 % (05/16 0800) HEMODYNAMICS: CVP:  [13 mmHg-24 mmHg] 24 mmHg VENTILATOR SETTINGS:   INTAKE / OUTPUT: Intake/Output     05/15 0701 - 05/16 0700 05/16 0701 - 05/17 0700   P.O. 120    I.V. (mL/kg)  125 (1.9)   IV Piggyback 1700 150   Total Intake(mL/kg) 1820 (27.4) 275 (4.1)   Net +1820 +275        Urine Occurrence 675 x     PHYSICAL EXAMINATION:  Gen: confused HEENT: NCAT, PERRL, EOMi, OP with thrush throughout PULM: CTA B CV: tachy, regular, no mgr, no JVD AB: BS infrequent, tenderness, guarding RUQ/epigastrum; L CVA tenderness Ext: warm, no edema, no clubbing, no cyanosis Derm: no rash or skin breakdown Neuro: Awake, oriented to place/year, follows commands, cn ii-xii intact, maew  LABS:  Recent Labs Lab 10/03/12 0052 10/03/12 0615 10/04/12 0510 10/04/12 0650 10/04/12 0845  HGB 11.7*  --  10.9*  --   --    WBC 31.4*  --  2.5*  --   --   PLT 105*  --  PLATELET CLUMPS NOTED ON SMEAR, COUNT APPEARS ADEQUATE  --   --   NA 129*  --  129*  --  125*  K 4.2  --  4.1  --  3.6  CL 91*  --  93*  --  93*  CO2 26  --  22  --  24  GLUCOSE 315*  --  148*  --  186*  BUN 16  --  10  --  11  CREATININE 1.99*  --  1.10  --  1.17  CALCIUM 7.9*  --  7.7*  --  7.2*  AST 197*  --  125*  --  116*  ALT 60*  --  47  --  44  ALKPHOS 232*  --  281*  --  208*  BILITOT 2.3*  --  3.2*  --  3.1*  PROT 6.4  --  5.9*  --  5.4*  ALBUMIN 2.0*  --  1.9*  --  1.8*  APTT  --   --   --   --  62*  INR  --  1.34  --   --  1.46  LATICACIDVEN  --   --  6.2*  --  2.8*  TROPONINI  --   --   --   --  <  0.30  PROCALCITON  --   --  9.08  --  10.57  PHART  --   --   --  7.458*  --   PCO2ART  --   --   --  33.2*  --   PO2ART  --   --   --  83.2  --    Recent Labs Lab 10/04/12 0215 10/04/12 0413 10/04/12 0604 10/04/12 0656 10/04/12 0809  GLUCAP 134* 105* 112* 133* 118*   CXR: CVL in place, lungs clear, cardiac silhouette normal  ASSESSMENT / PLAN:  PULMONARY A: No acute issues P:   - Monitor O2 saturation. - If mental status deteriorates will need intubation for airway protection.  CARDIOVASCULAR A: Septic shock P:  - CVL placed, SvO2 58%, will start dobutamine. - Sepsis protocol in progress. - Monitor CVP. - Levophed.  RENAL A:  AKI resolved with IVF P:   - Continue volume resuscitation. - Repeat CMET. - Replace K.  GASTROINTESTINAL A:  Abdominal pain, tender abdomen and GNR bacteremia; Very rapid change after MRI abdomen yesterday, so worried about new biliary disease vs pyelo, see discussion below Chronic pancreatitis by history and MRI abdomen Alcoholic hepatitis/fatty liver P:   - CT abdomen pending. - NPO.  HEMATOLOGIC A:  Leukopenia from sepsis/alcohol abuse Thrombocytopenia P:  - Monitor for bleeding. - Daily CBC.  INFECTIOUS A:  Septic shock with GNR bacteremia, unclear source; ddx  pyelonephritis given urinalysis/CVA tenderness, nephrolithiasis?, possibly biliary source given severe abdominal pain P: - CT abdomen now. - Zosyn/levaquin. - D/Ced vanc.  ENDOCRINE A:  Hypoglycemia in setting of sepsis, likely alcoholic hepatitis DM2 P:   - Monitor glucose. - D5.  NEUROLOGIC A:  Acute encephalopathy from sepsis and possible EtOH withdrawal P:   - MVI/thiamine/folate daily - Changed dilaudid to fentanyl - PRN ativan per CIWA  TODAY'S SUMMARY: 57 y/o male with EtOH abuse, chronic pancreatitis now with septic shock and GNR bacteremia from possible pyelonephritis vs biliary source.  CT abdomen pending and abx appropriate.  Start dobutamine for shock.  I have personally obtained a history, examined the patient, evaluated laboratory and imaging results, formulated the assessment and plan and placed orders.  CRITICAL CARE: The patient is critically ill with multiple organ systems failure and requires high complexity decision making for assessment and support, frequent evaluation and titration of therapies, application of advanced monitoring technologies and extensive interpretation of multiple databases. Critical Care Time devoted to patient care services described in this note is 35 minutes.   Alyson Reedy, M.D. Pulmonary and Critical Care Medicine [facility] Pager: [phone]  10/04/2012, 10:30 AM

## 2012-10-05 DIAGNOSIS — K861 Other chronic pancreatitis: Secondary | ICD-10-CM

## 2012-10-05 DIAGNOSIS — G8929 Other chronic pain: Secondary | ICD-10-CM

## 2012-10-05 DIAGNOSIS — N179 Acute kidney failure, unspecified: Secondary | ICD-10-CM

## 2012-10-05 DIAGNOSIS — K7010 Alcoholic hepatitis without ascites: Secondary | ICD-10-CM

## 2012-10-05 LAB — GLUCOSE, CAPILLARY
Glucose-Capillary: 80 mg/dL (ref 70–99)
Glucose-Capillary: 85 mg/dL (ref 70–99)
Glucose-Capillary: 131 mg/dL — ABNORMAL HIGH (ref 70–99)

## 2012-10-05 LAB — CBC
MCHC: 34.3 g/dL (ref 30.0–36.0)
Platelets: 67 10*3/uL — ABNORMAL LOW (ref 150–400)
RDW: 17.5 % — ABNORMAL HIGH (ref 11.5–15.5)

## 2012-10-05 LAB — MAGNESIUM: Magnesium: 1.6 mg/dL (ref 1.5–2.5)

## 2012-10-05 LAB — BASIC METABOLIC PANEL
GFR calc Af Amer: >90 mL/min (ref >90)
GFR calc non Af Amer: >90 mL/min (ref >90)
Potassium: 3.7 mEq/L (ref 3.5–5.1)
Sodium: 131 mEq/L — ABNORMAL LOW (ref 135–145)

## 2012-10-05 LAB — PHOSPHORUS
Phosphorus: 1.5 mg/dL — ABNORMAL LOW (ref 2.3–4.6)
Phosphorus: 2 mg/dL — ABNORMAL LOW (ref 2.3–4.6)

## 2012-10-05 MED ORDER — METOPROLOL TARTRATE 1 MG/ML IV SOLN
12.5000 mg | Freq: Two times a day (BID) | INTRAVENOUS | Status: DC
  Administered 2012-10-05: 12.5 mg via INTRAVENOUS
  Filled 2012-10-05: qty 5
  Filled 2012-10-05: qty 10

## 2012-10-05 MED ORDER — MAGNESIUM SULFATE 50 % IJ SOLN: 2.0000 g | Freq: Once | INTRAVENOUS | Status: DC

## 2012-10-05 MED ORDER — SODIUM PHOSPHATE 3 MMOLE/ML IV SOLN
30.0000 mmol | Freq: Once | INTRAVENOUS | Status: AC
  Administered 2012-10-05: 30 mmol via INTRAVENOUS
  Filled 2012-10-05: qty 10

## 2012-10-05 MED ORDER — METOPROLOL TARTRATE 12.5 MG HALF TABLET
12.5000 mg | ORAL_TABLET | Freq: Two times a day (BID) | ORAL | Status: DC
  Administered 2012-10-05 – 2012-10-08 (×6): 12.5 mg via ORAL
  Filled 2012-10-05 (×8): qty 1

## 2012-10-05 MED ORDER — SODIUM CHLORIDE 0.9 % IV SOLN
INTRAVENOUS | Status: DC
  Administered 2012-10-05 – 2012-10-07 (×3): via INTRAVENOUS

## 2012-10-05 MED ORDER — MAGNESIUM SULFATE 40 MG/ML IJ SOLN
2.0000 g | Freq: Once | INTRAMUSCULAR | Status: AC
  Administered 2012-10-05: 2 g via INTRAVENOUS
  Filled 2012-10-05: qty 50

## 2012-10-05 NOTE — Progress Notes (Signed)
PULMONARY  / CRITICAL CARE MEDICINE  Name: Dennis Zhang MRN: [id number] DOB: Sep 26, 1955    ADMISSION DATE:  10/02/2012 CONSULTATION DATE:  10/03/2012   REFERRING MD :  [name] PRIMARY SERVICE: TRH  CHIEF COMPLAINT:  Shock, abd pain  BRIEF PATIENT DESCRIPTION: 57 yo alcoholic, hx chronic pancreatitis, DM, HTN, chronic pain. Presented w back pain and hypoglycemia. Admitted 5/15 w apparent EtOH hepatitis, acute renal insufficiency, leukocytosis. Progressed to septic shock, source GNR bacteremia. To ICU am 5/16   SIGNIFICANT EVENTS / STUDIES:  MRI abd 5/15>> focal ectasia pancreatic duct at head and neck, chronic pancreatitis, no stone or obstructing lesion CT abdo 5/16>>>Again noted fatty infiltration of the liver. No intrahepatic<BR>biliary ductal dilatation.<BR>2. Tiny calcified layering gallstones are noted within<BR>gallbladder.<BR> <BR>3. Again noted chronic calcific pancreatitis. Again noted<BR>dilatation of the main pancreatic duct up to 8 mm.<BR>4. No hydronephrosis or hydroureter.<BR>5. 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 No distal colonic obstruction.<BR>7. There is atelectasis or infiltrate in the left lower lobe<BR>posteriorly.<BR>  LINES / TUBES: L IJ CVC 5/16 >>   CULTURES:  Blood 5/16 >> GNR >>> Urine 5/16 >> insig growth  ANTIBIOTICS: Vanco 5/15 >>>5/17 Zosyn 5/15 >>  levaquin 5/15 >>   SUBJECTIVE: no pressors, now HTN  VITAL SIGNS: Temp:  [97.5 F (36.4 C)-98.9 F (37.2 C)] 98.9 F (37.2 C) (05/17 0400) Pulse Rate:  [25-123] 95 (05/17 0629) Resp:  [13-23] 18 (05/17 0629) BP: (62-201)/(42-106) 167/95 mmHg (05/17 0629) SpO2:  [90 %-100 %] 100 % (05/17 0629) HEMODYNAMICS: CVP:  [2 mmHg-13 mmHg] 2 mmHg VENTILATOR SETTINGS:   INTAKE / OUTPUT: Intake/Output     05/16 0701 - 05/17 0700 05/17 0701 - 05/18 0700   P.O. 560    I.V. (mL/kg) 3488.2 (52.4)    IV Piggyback 1050    Total Intake(mL/kg)  5098.2 (76.6)    Urine (mL/kg/hr) 6000 (3.8)    Total Output 6000     Net -901.8           PHYSICAL EXAMINATION:  Gen: more alert HEENT: NCAT OP with thrush throughout PULM: CTA B CV: tachy, regular, no mgr, no JVD AB: BS iincreased, tenderness improved Ext: warm, no edema, no clubbing, no cyanosis Derm: no rash or skin breakdown Neuro: Awake, oriented to place/year, follows commands, cn ii-xii intact, maew  LABS:  Recent Labs Lab 10/03/12 0052 10/03/12 0615 10/04/12 0510 10/04/12 0650 10/04/12 0845 10/04/12 1400 10/05/12 0415  HGB 11.7*  --  10.9*  --   --   --  10.2*  WBC 31.4*  --  2.5*  --   --   --  16.5*  PLT 105*  --  PLATELET CLUMPS NOTED ON SMEAR, COUNT APPEARS ADEQUATE  --   --   --  67*  NA 129*  --  129*  --  125*  --  131*  K 4.2  --  4.1  --  3.6  --  3.7  CL 91*  --  93*  --  93*  --  100  CO2 26  --  22  --  24  --  27  GLUCOSE 315*  --  148*  --  186*  --  123*  BUN 16  --  10  --  11  --  6  CREATININE 1.99*  --  1.10  --  1.17  --  0.82  CALCIUM 7.9*  --  7.7*  --  7.2*  --  8.0*  MG  --   --   --   --   --   --  1.6  PHOS  --   --   --   --   --   --  1.5*  AST 197*  --  125*  --  116*  --   --   ALT 60*  --  47  --  44  --   --   ALKPHOS 232*  --  281*  --  208*  --   --   BILITOT 2.3*  --  3.2*  --  3.1*  --   --   PROT 6.4  --  5.9*  --  5.4*  --   --   ALBUMIN 2.0*  --  1.9*  --  1.8*  --   --   APTT  --   --   --   --  62*  --   --   INR  --  1.34  --   --  1.46  --   --   LATICACIDVEN  --   --  6.2*  --  2.8* 1.8  --   TROPONINI  --   --   --   --  <0.30  --   --   PROCALCITON  --   --  9.08  --  10.57  --   --   PHART  --   --   --  7.458*  --   --   --   PCO2ART  --   --   --  33.2*  --   --   --   PO2ART  --   --   --  83.2  --   --   --     Recent Labs Lab 10/04/12 2203 10/04/12 2342 10/05/12 0158 10/05/12 0414 10/05/12 0423  GLUCAP 115* 88 114* 107* 80   CXR: CVL in place, lungs clear, cardiac silhouette  normal  ASSESSMENT / PLAN:  PULMONARY A: No acute issues P:   - Monitor O2 saturation. - pcxr reviewed, IS  CARDIOVASCULAR A: Septic shock resolved P:  - CVL placed, SvO2  Improved to 70 - Sepsis protocol completed - dc cvp - Levophed now off -cortisol 20 borderline, but now HTN, avoid steroids  RENAL A:  AKI resolved with IVF, hypo phos, mag, hyponatremia P:   - back to saline to 50 , alst cvp 11 Chem in am  replace phos, mag  GASTROINTESTINAL A:  Abdominal pain, tender abdomen Chronic pancreatitis by history and MRI abdomen Alcoholic hepatitis/fatty liver P:   - CT abdomen no clear source, appears as urine as source - add full liquids, if declines, consult GI  HEMATOLOGIC A:  Leukopenia from sepsis/alcohol abuse Thrombocytopenia-likley sepsis P:  - Monitor for bleeding. - am CBC as we treat gram neg  INFECTIOUS A:  Septic shock with GNR bacteremia, unclear source; likely urine P: - CT abdomen reviewed - Zosyn/levaquin. - D/Ced vanc -low yield to reculture urine after abx  ENDOCRINE A:  Hypoglycemia in setting of sepsis, likely alcoholic hepatitis DM2 P:   - Monitor glucose, add diet - D5 dc after diet added  NEUROLOGIC A:  Acute encephalopathy from sepsis and possible EtOH withdrawal P:   - MVI/thiamine/folate daily - fentanyl - PRN ativan   TODAY'S SUMMARY: 57 y/o male with EtOH abuse, chronic pancreatitis now with septic shock and GNR bacteremia from possible pyelonephritis vs biliary source.  CT abdomen no acute changes, urine appears as source and has rapid resolving. To triad, sdu  I have personally obtained a history,  examined the patient, evaluated laboratory and imaging results, formulated the assessment and plan and placed orders.    Mcarthur Rossetti. Tyson Alias, MD, FACP Pgr: [phone] [facility]  10/05/2012, 7:07 AM

## 2012-10-05 NOTE — Progress Notes (Signed)
[facility] ADULT ICU REPLACEMENT PROTOCOL FOR AM LAB REPLACEMENT ONLY  The patient does apply for the [facility] Adult ICU Electrolyte Replacment Protocol based on the criteria listed below:   1. Is GFR >/= 40 ml/min? yes  Patient's GFR today is >90 2. Is urine output >/= 0.5 ml/kg/hr for the last 6 hours? yes Patient's UOP is 4.57 ml/kg/hr 3. Is BUN < 60 mg/dL? yes  Patient's BUN today is 6 4. Abnormal electrolyte(s): mg 1.6   Phos 1.5 5. Ordered repletion with:see orders 6. If a panic level lab has been reported, has the CCM MD in charge been notified? yes.   Physician:  Dr. Tyson Babinski, Nicanor Mendolia A 10/05/2012 6:43 AM

## 2012-10-06 DIAGNOSIS — R7881 Bacteremia: Secondary | ICD-10-CM

## 2012-10-06 DIAGNOSIS — E876 Hypokalemia: Secondary | ICD-10-CM

## 2012-10-06 LAB — URINE CULTURE

## 2012-10-06 LAB — GLUCOSE, CAPILLARY
Glucose-Capillary: 111 mg/dL — ABNORMAL HIGH (ref 70–99)
Glucose-Capillary: 120 mg/dL — ABNORMAL HIGH (ref 70–99)

## 2012-10-06 LAB — COMPREHENSIVE METABOLIC PANEL
ALT: 39 U/L (ref 0–53)
AST: 87 U/L — ABNORMAL HIGH (ref 0–37)
CO2: 27 mEq/L (ref 19–32)
Calcium: 8.5 mg/dL (ref 8.4–10.5)
Chloride: 94 mEq/L — ABNORMAL LOW (ref 96–112)
Creatinine, Ser: 0.78 mg/dL (ref 0.50–1.35)
GFR calc Af Amer: >90 mL/min (ref >90)
GFR calc non Af Amer: >90 mL/min (ref >90)
Glucose, Bld: 129 mg/dL — ABNORMAL HIGH (ref 70–99)
Total Bilirubin: 6.8 mg/dL — ABNORMAL HIGH (ref 0.3–1.2)

## 2012-10-06 LAB — CULTURE, BLOOD (ROUTINE X 2)

## 2012-10-06 LAB — CBC WITH DIFFERENTIAL/PLATELET
Basophils Relative: 0 % (ref 0–1)
Eosinophils Absolute: 0 10*3/uL (ref 0.0–0.7)
Hemoglobin: 11.4 g/dL — ABNORMAL LOW (ref 13.0–17.0)
Lymphocytes Relative: 11 % — ABNORMAL LOW (ref 12–46)
MCH: 32.7 pg (ref 26.0–34.0)
MCHC: 34.8 g/dL (ref 30.0–36.0)
Monocytes Absolute: 1.1 10*3/uL — ABNORMAL HIGH (ref 0.1–1.0)
Neutrophils Relative %: 79 % — ABNORMAL HIGH (ref 43–77)
Platelets: 62 10*3/uL — ABNORMAL LOW (ref 150–400)
RBC: 3.49 MIL/uL — ABNORMAL LOW (ref 4.22–5.81)

## 2012-10-06 MED ORDER — HYDRALAZINE HCL 20 MG/ML IJ SOLN
10.0000 mg | INTRAMUSCULAR | Status: DC | PRN
  Administered 2012-10-06: 10 mg via INTRAVENOUS
  Filled 2012-10-06: qty 1

## 2012-10-06 MED ORDER — BIOTENE DRY MOUTH MT LIQD
15.0000 mL | Freq: Two times a day (BID) | OROMUCOSAL | Status: DC
  Administered 2012-10-06 – 2012-10-07 (×3): 15 mL via OROMUCOSAL

## 2012-10-06 MED ORDER — CIPROFLOXACIN IN D5W 400 MG/200ML IV SOLN
400.0000 mg | Freq: Two times a day (BID) | INTRAVENOUS | Status: DC
  Administered 2012-10-06 – 2012-10-07 (×2): 400 mg via INTRAVENOUS
  Filled 2012-10-06 (×4): qty 200

## 2012-10-06 MED ORDER — POTASSIUM CHLORIDE CRYS ER 20 MEQ PO TBCR
40.0000 meq | EXTENDED_RELEASE_TABLET | Freq: Once | ORAL | Status: AC
  Administered 2012-10-06: 40 meq via ORAL
  Filled 2012-10-06 (×2): qty 2

## 2012-10-06 MED ORDER — LABETALOL HCL 5 MG/ML IV SOLN
20.0000 mg | INTRAVENOUS | Status: DC | PRN
  Administered 2012-10-06: 20 mg via INTRAVENOUS
  Filled 2012-10-06 (×3): qty 4

## 2012-10-06 MED ORDER — CHLORHEXIDINE GLUCONATE 0.12 % MT SOLN
15.0000 mL | Freq: Two times a day (BID) | OROMUCOSAL | Status: DC
  Administered 2012-10-06 – 2012-10-07 (×3): 15 mL via OROMUCOSAL
  Filled 2012-10-06 (×6): qty 15

## 2012-10-06 MED ORDER — POTASSIUM CHLORIDE CRYS ER 20 MEQ PO TBCR
40.0000 meq | EXTENDED_RELEASE_TABLET | Freq: Once | ORAL | Status: AC
  Administered 2012-10-06: 40 meq via ORAL
  Filled 2012-10-06: qty 2

## 2012-10-06 NOTE — Progress Notes (Signed)
[facility] PROGRESS NOTE  Dennis Zhang WUJ:[id number] DOB: [birth year] DOA: 10/02/2012 PCP: [name], MD Per prior note: BRIEF PATIENT DESCRIPTION: 57 yo alcoholic, hx chronic pancreatitis, DM, HTN, chronic pain. Presented w back pain and hypoglycemia. Admitted 5/15 w apparent EtOH hepatitis, acute renal insufficiency, leukocytosis. Progressed to septic shock, source GNR bacteremia. To ICU am 5/16   Assessment/Plan: 1. Gram negative bacteremia - Grew out e coli - currently on Zosyn and WBC trending down - Change to cipro as e coli sensitive to this per sensitivities and can be easily transitioned to oral regimen once ready for discharge. - Source unknown at this point  2. Sepsis - resolving on IV antibiotics with Zosyn - transition to floor given improvement in condition: telemetry  3. Alcoholic liver cirrhosis - Discussed abstinence with patient and he verbalizes agreement - no abdominal discomfort today   4. Alcohol Dependence - Continue ciwa protocol  5. Hypokalemia - Will replace orally and then reassess levels next am  6. Chronic pancreatitis - stable currently  Code Status: full Family Communication:  No family at bedside. Disposition Plan: Pending continued improvement in condition. Will transition to telemetry today   Consultants:  Was followed initially by PCCM  Procedures: L IJ CVC 5/16 >>   Antibiotics:  On Zosyn d/c 5/18  Will change to Cipro today 5/18  HPI/Subjective: Pt has no new complaints currently. No acute issues reported overnight to me  Objective: Filed Vitals:   10/06/12 0600 10/06/12 0700 10/06/12 0800 10/06/12 1200  BP:   165/69 121/67  Pulse: 87 90 87   Temp:    98.2 F (36.8 C)  TempSrc:    Oral  Resp: 17 18 20 20   Height:      Weight:      SpO2: 100% 100% 100%     Intake/Output Summary (Last 24 hours) at 10/06/12 1221 Last data filed at 10/06/12 1200  Gross per 24 hour  Intake   1230 ml  Output   2270 ml   Net  -1040 ml   Filed Weights   10/02/12 2359 10/03/12 0547  Weight: 70.308 kg (155 lb) 66.543 kg (146 lb 11.2 oz)    Exam:   General:  Pt in NAD, Alert and Awake  Cardiovascular: RRR, No mrg  Respiratory: cta bl, no wheezes  Abdomen: soft, NT  Musculoskeletal: no cyanosis or clubbing   Data Reviewed: Basic Metabolic Panel:  Recent Labs Lab 10/03/12 0052 10/04/12 0510 10/04/12 0845 10/05/12 0415 10/05/12 2003 10/06/12 0351  NA 129* 129* 125* 131*  --  128*  K 4.2 4.1 3.6 3.7  --  3.1*  CL 91* 93* 93* 100  --  94*  CO2 26 22 24 27   --  27  GLUCOSE 315* 148* 186* 123*  --  129*  BUN 16 10 11 6   --  6  CREATININE 1.99* 1.10 1.17 0.82  --  0.78  CALCIUM 7.9* 7.7* 7.2* 8.0*  --  8.5  MG  --   --   --  1.6  --  1.8  PHOS  --   --   --  1.5* 2.0*  --    Liver Function Tests:  Recent Labs Lab 10/03/12 0052 10/04/12 0510 10/04/12 0845 10/06/12 0351  AST 197* 125* 116* 87*  ALT 60* 47 44 39  ALKPHOS 232* 281* 208* 186*  BILITOT 2.3* 3.2* 3.1* 6.8*  PROT 6.4 5.9* 5.4* 6.2  ALBUMIN 2.0* 1.9* 1.8* 1.9*    Recent  Labs Lab 10/03/12 0052  LIPASE 6*   No results found for this basename: AMMONIA,  in the last 168 hours CBC:  Recent Labs Lab 10/03/12 0052 10/04/12 0510 10/05/12 0415 10/06/12 0351  WBC 31.4* 2.5* 16.5* 11.4*  NEUTROABS 29.2*  --   --  9.0*  HGB 11.7* 10.9* 10.2* 11.4*  HCT 32.8* 32.0* 29.7* 32.8*  MCV 94.8 96.1 94.6 94.0  PLT 105* PLATELET CLUMPS NOTED ON SMEAR, COUNT APPEARS ADEQUATE 67* 62*   Cardiac Enzymes:  Recent Labs Lab 10/04/12 0845  TROPONINI <0.30   BNP (last 3 results) No results found for this basename: PROBNP,  in the last 8760 hours CBG:  Recent Labs Lab 10/05/12 1628 10/05/12 1930 10/06/12 0006 10/06/12 0402 10/06/12 0734  GLUCAP 131* 113* 121* 125* 111*    Recent Results (from the past 240 hour(s))  URINE CULTURE     Status: None   Collection Time    10/03/12  3:56 AM      Result Value Range Status    Specimen Description URINE, CLEAN CATCH   Final   Special Requests NONE   Final   Culture  Setup Time 10/03/2012 12:38   Final   Colony Count 9,000 COLONIES/ML   Final   Culture INSIGNIFICANT GROWTH   Final   Report Status 10/04/2012 FINAL   Final  CULTURE, BLOOD (ROUTINE X 2)     Status: None   Collection Time    10/03/12  6:10 AM      Result Value Range Status   Specimen Description BLOOD RIGHT ANTECUBITAL   Final   Special Requests BOTTLES DRAWN AEROBIC ONLY 3CC   Final   Culture  Setup Time 10/03/2012 09:28   Final   Culture     Final   Value: ESCHERICHIA COLI     Note: SUSCEPTIBILITIES PERFORMED ON PREVIOUS CULTURE WITHIN THE LAST 5 DAYS.     Note: Gram Stain Report Called to,Read Back By and Verified With: VERA JACKSON ON 10/04/2012 AT 12:20A BY WILEJ   Report Status 10/06/2012 FINAL   Final  CULTURE, BLOOD (ROUTINE X 2)     Status: None   Collection Time    10/03/12  6:15 AM      Result Value Range Status   Specimen Description BLOOD LEFT HAND   Final   Special Requests BOTTLES DRAWN AEROBIC ONLY 4CC   Final   Culture  Setup Time 10/03/2012 09:28   Final   Culture     Final   Value: ESCHERICHIA COLI     Note: Gram Stain Report Called to,Read Back By and Verified With: VERA JACKSON ON 10/04/2012 AT 12:20A BY WILEJ   Report Status 10/06/2012 FINAL   Final   Organism ID, Bacteria ESCHERICHIA COLI   Final  URINE CULTURE     Status: None   Collection Time    10/04/12  5:07 AM      Result Value Range Status   Specimen Description URINE, RANDOM   Final   Special Requests NONE   Final   Culture  Setup Time 10/04/2012 09:59   Final   Colony Count 85,000 COLONIES/ML   Final   Culture ESCHERICHIA COLI   Final   Report Status PENDING   Incomplete  MRSA PCR SCREENING     Status: None   Collection Time    10/04/12  5:50 AM      Result Value Range Status   MRSA by PCR NEGATIVE  NEGATIVE Final   Comment:  The GeneXpert MRSA Assay (FDA     approved for NASAL specimens      only), is one component of a     comprehensive MRSA colonization     surveillance program. It is not     intended to diagnose MRSA     infection nor to guide or     monitor treatment for     MRSA infections.  CULTURE, BLOOD (ROUTINE X 2)     Status: None   Collection Time    10/04/12  8:25 AM      Result Value Range Status   Specimen Description BLOOD LEFT ARM   Final   Special Requests BOTTLES DRAWN AEROBIC AND ANAEROBIC 2CC   Final   Culture  Setup Time 10/04/2012 10:59   Final   Culture     Final   Value:        BLOOD CULTURE RECEIVED NO GROWTH TO DATE CULTURE WILL BE HELD FOR 5 DAYS BEFORE ISSUING A FINAL NEGATIVE REPORT   Report Status PENDING   Incomplete  CULTURE, BLOOD (ROUTINE X 2)     Status: None   Collection Time    10/04/12  8:30 AM      Result Value Range Status   Specimen Description BLOOD LEFT ARM   Final   Special Requests BOTTLES DRAWN AEROBIC AND ANAEROBIC 1CC   Final   Culture  Setup Time 10/04/2012 10:59   Final   Culture     Final   Value:        BLOOD CULTURE RECEIVED NO GROWTH TO DATE CULTURE WILL BE HELD FOR 5 DAYS BEFORE ISSUING A FINAL NEGATIVE REPORT   Report Status PENDING   Incomplete     Studies: No results found.  Scheduled Meds: . antiseptic oral rinse  15 mL Mouth Rinse q12n4p  . chlorhexidine  15 mL Mouth Rinse BID  . feeding supplement  237 mL Oral TID BM  . folic acid  1 mg Oral Daily  . gabapentin  300 mg Oral TID  . metoprolol tartrate  12.5 mg Oral BID  . multivitamin with minerals  1 tablet Oral Daily  . nicotine  21 mg Transdermal Daily  . piperacillin-tazobactam (ZOSYN)  IV  3.375 g Intravenous Q8H  . sodium chloride  3 mL Intravenous Q12H  . thiamine  100 mg Oral Daily   Or  . thiamine  100 mg Intravenous Daily   Continuous Infusions: . sodium chloride 50 mL/hr at 10/06/12 0653  . DOBUTamine Stopped (10/04/12 1900)    Active Problems:   Leukocytosis   Chronic pancreatitis   AKI (acute kidney injury)   Alcoholic  hepatitis   Hypoglycemia secondary to sulfonylurea   Hypokalemia    Time spent: > 35 minutes    Penny Pia  [facility] Pager [phone]. If 7PM-7AM, please contact night-coverage at [email or web address], password [facility] 10/06/2012, 12:21 PM  LOS: 4 days

## 2012-10-06 NOTE — Progress Notes (Signed)
ANTIBIOTIC CONSULT NOTE - INITIAL  Pharmacy Consult for Cipro Indication: E.coli Bacteremia/UTI   Allergies  Allergen Reactions  . Tylenol (Acetaminophen) Nausea Only    States can take Tylenol if has other pain med w/it - like Hydrocodone    Patient Measurements: Height: 6\' 1"  (185.4 cm) Weight: 146 lb 11.2 oz (66.543 kg) IBW/kg (Calculated) : 79.9  Vital Signs: Temp: 98.2 F (36.8 C) (05/18 1200) Temp src: Oral (05/18 1200) BP: 121/67 mmHg (05/18 1200) Pulse Rate: 87 (05/18 0800) Intake/Output from previous day: 05/17 0701 - 05/18 0700 In: 2316.7 [P.O.:600; I.V.:1106.7; IV Piggyback:610] Out: 3075 [Urine:3075] Intake/Output from this shift: Total I/O In: 100 [I.V.:50; IV Piggyback:50] Out: 370 [Urine:370]  Labs:  Recent Labs  10/04/12 0510 10/04/12 0845 10/05/12 0415 10/06/12 0351  WBC 2.5*  --  16.5* 11.4*  HGB 10.9*  --  10.2* 11.4*  PLT PLATELET CLUMPS NOTED ON SMEAR, COUNT APPEARS ADEQUATE  --  67* 62*  CREATININE 1.10 1.17 0.82 0.78   Estimated Creatinine Clearance: 95.8 ml/min (by C-G formula based on Cr of 0.78). No results found for this basename: VANCOTROUGH, Leodis Binet, VANCORANDOM, GENTTROUGH, GENTPEAK, GENTRANDOM, TOBRATROUGH, TOBRAPEAK, TOBRARND, AMIKACINPEAK, AMIKACINTROU, AMIKACIN,  in the last 72 hours   Microbiology: Recent Results (from the past 720 hour(s))  URINE CULTURE     Status: None   Collection Time    10/03/12  3:56 AM      Result Value Range Status   Specimen Description URINE, CLEAN CATCH   Final   Special Requests NONE   Final   Culture  Setup Time 10/03/2012 12:38   Final   Colony Count 9,000 COLONIES/ML   Final   Culture INSIGNIFICANT GROWTH   Final   Report Status 10/04/2012 FINAL   Final  CULTURE, BLOOD (ROUTINE X 2)     Status: None   Collection Time    10/03/12  6:10 AM      Result Value Range Status   Specimen Description BLOOD RIGHT ANTECUBITAL   Final   Special Requests BOTTLES DRAWN AEROBIC ONLY 3CC   Final   Culture  Setup Time 10/03/2012 09:28   Final   Culture     Final   Value: ESCHERICHIA COLI     Note: SUSCEPTIBILITIES PERFORMED ON PREVIOUS CULTURE WITHIN THE LAST 5 DAYS.     Note: Gram Stain Report Called to,Read Back By and Verified With: VERA JACKSON ON 10/04/2012 AT 12:20A BY WILEJ   Report Status 10/06/2012 FINAL   Final  CULTURE, BLOOD (ROUTINE X 2)     Status: None   Collection Time    10/03/12  6:15 AM      Result Value Range Status   Specimen Description BLOOD LEFT HAND   Final   Special Requests BOTTLES DRAWN AEROBIC ONLY 4CC   Final   Culture  Setup Time 10/03/2012 09:28   Final   Culture     Final   Value: ESCHERICHIA COLI     Note: Gram Stain Report Called to,Read Back By and Verified With: VERA JACKSON ON 10/04/2012 AT 12:20A BY WILEJ   Report Status 10/06/2012 FINAL   Final   Organism ID, Bacteria ESCHERICHIA COLI   Final  URINE CULTURE     Status: None   Collection Time    10/04/12  5:07 AM      Result Value Range Status   Specimen Description URINE, RANDOM   Final   Special Requests NONE   Final   Culture  Setup Time  10/04/2012 09:59   Final   Colony Count 85,000 COLONIES/ML   Final   Culture ESCHERICHIA COLI   Final   Report Status PENDING   Incomplete  MRSA PCR SCREENING     Status: None   Collection Time    10/04/12  5:50 AM      Result Value Range Status   MRSA by PCR NEGATIVE  NEGATIVE Final   Comment:            The GeneXpert MRSA Assay (FDA     approved for NASAL specimens     only), is one component of a     comprehensive MRSA colonization     surveillance program. It is not     intended to diagnose MRSA     infection nor to guide or     monitor treatment for     MRSA infections.  CULTURE, BLOOD (ROUTINE X 2)     Status: None   Collection Time    10/04/12  8:25 AM      Result Value Range Status   Specimen Description BLOOD LEFT ARM   Final   Special Requests BOTTLES DRAWN AEROBIC AND ANAEROBIC 2CC   Final   Culture  Setup Time 10/04/2012 10:59    Final   Culture     Final   Value:        BLOOD CULTURE RECEIVED NO GROWTH TO DATE CULTURE WILL BE HELD FOR 5 DAYS BEFORE ISSUING A FINAL NEGATIVE REPORT   Report Status PENDING   Incomplete  CULTURE, BLOOD (ROUTINE X 2)     Status: None   Collection Time    10/04/12  8:30 AM      Result Value Range Status   Specimen Description BLOOD LEFT ARM   Final   Special Requests BOTTLES DRAWN AEROBIC AND ANAEROBIC 1CC   Final   Culture  Setup Time 10/04/2012 10:59   Final   Culture     Final   Value:        BLOOD CULTURE RECEIVED NO GROWTH TO DATE CULTURE WILL BE HELD FOR 5 DAYS BEFORE ISSUING A FINAL NEGATIVE REPORT   Report Status PENDING   Incomplete    Medical History: Past Medical History  Diagnosis Date  . Chronic pain   . Arthritis   . Diabetes mellitus without complication   . Hypertension   . Chronic pain     Medications:  Prescriptions prior to admission  Medication Sig Dispense Refill  . ALPRAZolam (XANAX) 1 MG tablet Take 1 mg by mouth 2 (two) times daily as needed for anxiety.      . cyclobenzaprine (FLEXERIL) 5 MG tablet Take 5 mg by mouth 3 (three) times daily.      Marland Kitchen gabapentin (NEURONTIN) 300 MG capsule Take 1 capsule (300 mg total) by mouth 3 (three) times daily.  45 capsule  0  . glipiZIDE (GLUCOTROL) 5 MG tablet Take 1 tablet (5 mg total) by mouth 2 (two) times daily before a meal.  60 tablet  0  . lisinopril (PRINIVIL,ZESTRIL) 5 MG tablet Take 1 tablet (5 mg total) by mouth daily.  30 tablet  0  . omeprazole (PRILOSEC) 20 MG capsule Take 20 mg by mouth daily.      . simvastatin (ZOCOR) 20 MG tablet Take 0.5 tablets (10 mg total) by mouth at bedtime.  30 tablet  0  . traZODone (DESYREL) 150 MG tablet Take 75 mg by mouth at bedtime.  Scheduled:  . antiseptic oral rinse  15 mL Mouth Rinse q12n4p  . chlorhexidine  15 mL Mouth Rinse BID  . ciprofloxacin  400 mg Intravenous Q12H  . feeding supplement  237 mL Oral TID BM  . folic acid  1 mg Oral Daily  .  gabapentin  300 mg Oral TID  . metoprolol tartrate  12.5 mg Oral BID  . multivitamin with minerals  1 tablet Oral Daily  . nicotine  21 mg Transdermal Daily  . sodium chloride  3 mL Intravenous Q12H  . thiamine  100 mg Oral Daily   Or  . thiamine  100 mg Intravenous Daily   Infusions:  . sodium chloride 50 mL/hr at 10/06/12 0653  . DOBUTamine Stopped (10/04/12 1900)   PRN: fentaNYL, hydrALAZINE, labetalol, LORazepam, LORazepam, sodium chloride, sodium chloride Anti-infectives   Start     Dose/Rate Route Frequency Ordered Stop   10/06/12 2000  ciprofloxacin (CIPRO) IVPB 400 mg     400 mg 200 mL/hr over 60 Minutes Intravenous Every 12 hours 10/06/12 1247     10/05/12 0756  levofloxacin (LEVAQUIN) IVPB 750 mg  Status:  Discontinued     750 mg 100 mL/hr over 90 Minutes Intravenous Every 24 hours 10/04/12 0910 10/05/12 1331   10/04/12 1800  vancomycin (VANCOCIN) IVPB 750 mg/150 ml premix  Status:  Discontinued     750 mg 150 mL/hr over 60 Minutes Intravenous Every 12 hours 10/04/12 0912 10/05/12 0710   10/04/12 0800  piperacillin-tazobactam (ZOSYN) IVPB 3.375 g  Status:  Discontinued     3.375 g 12.5 mL/hr over 240 Minutes Intravenous Every 8 hours 10/04/12 0119 10/06/12 1243   10/04/12 0600  levofloxacin (LEVAQUIN) IVPB 750 mg  Status:  Discontinued     750 mg 100 mL/hr over 90 Minutes Intravenous Every 48 hours 10/04/12 0527 10/04/12 0910   10/04/12 0600  vancomycin (VANCOCIN) IVPB 1000 mg/200 mL premix  Status:  Discontinued     1,000 mg 200 mL/hr over 60 Minutes Intravenous Every 24 hours 10/04/12 0549 10/04/12 0912   10/04/12 0130  piperacillin-tazobactam (ZOSYN) IVPB 3.375 g     3.375 g 100 mL/hr over 30 Minutes Intravenous  Once 10/04/12 0119 10/04/12 0230     Assessment:  57 yo M admitted with sepsis secondary to E.coli bacteremia/UTI, Sepsis resolving, on D#3 Antibiotics, narrowing today to D#1 Ciprofloxacin based on culture sensitivities.  WBC trending down.  Remains AF.   Renal function is wnl and stable.  Repeat blood cultures from 5/16 are NGTD  5/15 blood x 2 - E.coli.  5/15 Urine: Sent (UA +) - culture 85 colonies E.coli 5/16 Blood x 2 Repeat - NGTD   Goal of Therapy:  Cipro per renal function   Plan:  1.) Cipro 400 mg IV q12h  2.) Monitor renal function  3.) F/u transition to PO when appropriate.   BorgerdingLoma Messing PharmD Pager #: [phone] 12:54 PM 10/06/2012

## 2012-10-07 DIAGNOSIS — A419 Sepsis, unspecified organism: Secondary | ICD-10-CM

## 2012-10-07 DIAGNOSIS — B9689 Other specified bacterial agents as the cause of diseases classified elsewhere: Secondary | ICD-10-CM

## 2012-10-07 DIAGNOSIS — E871 Hypo-osmolality and hyponatremia: Secondary | ICD-10-CM

## 2012-10-07 DIAGNOSIS — R7881 Bacteremia: Secondary | ICD-10-CM

## 2012-10-07 LAB — BASIC METABOLIC PANEL
BUN: 9 mg/dL (ref 6–23)
Calcium: 8.4 mg/dL (ref 8.4–10.5)
GFR calc non Af Amer: >90 mL/min (ref >90)
Glucose, Bld: 149 mg/dL — ABNORMAL HIGH (ref 70–99)
Sodium: 128 mEq/L — ABNORMAL LOW (ref 135–145)

## 2012-10-07 LAB — GLUCOSE, CAPILLARY
Glucose-Capillary: 20 mg/dL — CL (ref 70–99)
Glucose-Capillary: 34 mg/dL — CL (ref 70–99)
Glucose-Capillary: 30 mg/dL — CL (ref 70–99)

## 2012-10-07 MED ORDER — SODIUM BICARBONATE 8.4 % IV SOLN
INTRAVENOUS | Status: DC
  Administered 2012-10-07: 11:00:00 via INTRAVENOUS
  Filled 2012-10-07 (×2): qty 150

## 2012-10-07 MED ORDER — OXYCODONE HCL ER 15 MG PO T12A
15.0000 mg | EXTENDED_RELEASE_TABLET | Freq: Two times a day (BID) | ORAL | Status: DC
  Administered 2012-10-07 – 2012-10-08 (×3): 15 mg via ORAL
  Filled 2012-10-07 (×3): qty 1

## 2012-10-07 MED ORDER — OXYCODONE HCL 5 MG PO TABS
5.0000 mg | ORAL_TABLET | ORAL | Status: DC | PRN
  Administered 2012-10-08: 5 mg via ORAL
  Filled 2012-10-07 (×2): qty 1

## 2012-10-07 NOTE — Progress Notes (Signed)
Received order for rw.  Delivered to hospital room.  °

## 2012-10-07 NOTE — Evaluation (Addendum)
Physical Therapy Evaluation Patient Details Name: Dennis Zhang MRN: [id number] DOB: [birth year] Today's Date: 10/07/2012 Time: 0930-1000 PT Time Calculation (min): 30 min  PT Assessment / Plan / Recommendation Clinical Impression  57 yo male admitted with leukocytosis, falls, septic shock, hypoglycemia. Pt was modified independent prior to admission with use of cane. On eval, pt currently requires Min assist for mobility-able to ambulate ~170 feet with RW but with some difficulty. Pt lives alone-do not feel he is safe to d/c home on today. Recommend HHPT at discharge. May also need to consider home health aide. Recommend ambulation with nursing with RW use during stay as well.     PT Assessment  Patient needs continued PT services    Follow Up Recommendations  Home health PT; Home Health Aide    Does the patient have the potential to tolerate intense rehabilitation      Barriers to Discharge        Equipment Recommendations  Rolling walker with 5" wheels    Recommendations for Other Services OT consult   Frequency Min 3X/week    Precautions / Restrictions Precautions Precautions: Fall Restrictions Weight Bearing Restrictions: No   Pertinent Vitals/Pain Back, neck , L side-8/10 with activity. Pt does have chronic pain issues.       Mobility  Bed Mobility Bed Mobility: Supine to Sit Supine to Sit: HOB elevated;With rails;4: Min assist Details for Bed Mobility Assistance: Assist for L LE off bed and trunk to upright. Increased time.  Transfers Transfers: Sit to Stand;Stand to Sit Sit to Stand: 4: Min assist;From bed;From elevated surface Stand to Sit: 4: Min assist;To chair/3-in-1 Details for Transfer Assistance: VCs safety, technique, hand placement. Assist to rise, stabilize, control descent.  Ambulation/Gait Ambulation/Gait Assistance: 4: Min assist Ambulation Distance (Feet): 170 Feet Assistive device: Rolling walker Ambulation/Gait Assistance Details: Assist to  stabilize throughout ambulation. Multiple standing rest breaks. VCS safety, distance from RW, posture. Pt c/o "stiffness" throughout ambulation.  Gait Pattern: Step-through pattern;Trunk flexed;Decreased stride length    Exercises     PT Diagnosis: Difficulty walking;Abnormality of gait;Generalized weakness  PT Problem List: Decreased strength;Decreased activity tolerance;Decreased mobility;Pain;Decreased knowledge of use of DME PT Treatment Interventions: DME instruction;Gait training;Functional mobility training;Therapeutic activities;Therapeutic exercise;Patient/family education   PT Goals Acute Rehab PT Goals PT Goal Formulation: With patient Time For Goal Achievement: 10/14/12 Potential to Achieve Goals: Good Pt will go Supine/Side to Sit: with modified independence PT Goal: Supine/Side to Sit - Progress: Goal set today Pt will go Sit to Supine/Side: with modified independence PT Goal: Sit to Supine/Side - Progress: Goal set today Pt will go Sit to Stand: with modified independence PT Goal: Sit to Stand - Progress: Goal set today Pt will Ambulate: 51 - 150 feet;with modified independence;with least restrictive assistive device PT Goal: Ambulate - Progress: Goal set today Pt will Perform Home Exercise Program: with supervision, verbal cues required/provided PT Goal: Perform Home Exercise Program - Progress: Goal set today  Visit Information  Last PT Received On: 10/07/12 Assistance Needed: +1    Subjective Data  Subjective: Im so stiff. I havent been up for 5 days Patient Stated Goal: home   Prior Functioning  Home Living Lives With: Alone Type of Home: Apartment Home Access: Level entry Bathroom Shower/Tub: Tub/shower unit Home Adaptive Equipment: Straight cane Prior Function Level of Independence: Independent with assistive device(s) Driving: No Communication Communication: No difficulties    Cognition  Cognition Arousal/Alertness: Awake/alert Behavior During  Therapy: WFL for tasks assessed/performed Overall Cognitive Status: Within  Functional Limits for tasks assessed    Extremity/Trunk Assessment Right Lower Extremity Assessment RLE ROM/Strength/Tone: Deficits RLE ROM/Strength/Tone Deficits: Strength at least 3+/5 with functional mobility Left Lower Extremity Assessment LLE ROM/Strength/Tone: Deficits LLE ROM/Strength/Tone Deficits: Strength at least 3+/5 with functional mobility. Pt reports history of chronic hip issues.  Trunk Assessment Trunk Assessment: Kyphotic   Balance    End of Session PT - End of Session Equipment Utilized During Treatment: Gait belt Activity Tolerance: Patient limited by fatigue;Patient limited by pain Patient left: in chair;with call bell/phone within reach Nurse Communication: Mobility status  GP     Rebeca Alert, MPT Pager: [phone]

## 2012-10-07 NOTE — Progress Notes (Signed)
[facility] PROGRESS NOTE  Dennis Zhang ZOX:[id number] DOB: 24-Jan-1956 DOA: 10/02/2012 PCP: [name], MD Per prior note: BRIEF PATIENT DESCRIPTION: 57 yo alcoholic, hx chronic pancreatitis, DM, HTN, chronic pain. Presented w back pain and hypoglycemia. Admitted 5/15 w apparent EtOH hepatitis, acute renal insufficiency, leukocytosis. Progressed to septic shock, source GNR bacteremia. To ICU am 5/16   Assessment/Plan: 1. Gram negative bacteremia - Grew out e coli - currently on Cipro and WBC trending down - Source unknown at this point  2. Sepsis - resolving on IV antibiotics with Zosyn - Currently on telemetry with no reported red flags - patient debilitated and Physical therapy does not recommend discharging home given debility.  Patient will require ambulation with assistance from nursing and rolling walker.  3. Alcoholic liver cirrhosis - Discussed abstinence with patient and he verbalizes agreement - no abdominal discomfort today   4. Alcohol Dependence - Continue ciwa protocol  5. Hypokalemia - Will replace orally and then reassess levels next am  6. Chronic pancreatitis - stable currently  7. Hyponatremia - Likely due to Alcoholic liver cirrhosis and poor oral intake given recent sepsis - Recheck level next am.  Code Status: full Family Communication:  No family at bedside. Disposition Plan: Pending continued improvement in condition. Will transition to telemetry today   Consultants:  Was followed initially by PCCM  Procedures: L IJ CVC 5/16 >>   Antibiotics:  On Zosyn d/c 5/18  Will change to Cipro today 5/18  HPI/Subjective: Pt has no new complaints currently. No acute issues reported overnight to me. Physical therapy feels patient is still quite debilitated.  Objective: Filed Vitals:   10/06/12 1200 10/06/12 1405 10/06/12 2114 10/07/12 0518  BP: 121/67 157/93 168/94 155/90  Pulse:  102 95 100  Temp: 98.2 F (36.8 C) 98.3 F (36.8 C)  98.4 F (36.9 C) 98.5 F (36.9 C)  TempSrc: Oral Oral Oral Oral  Resp: 20 18 18 18   Height:      Weight:      SpO2:  98% 100% 100%    Intake/Output Summary (Last 24 hours) at 10/07/12 1356 Last data filed at 10/07/12 1044  Gross per 24 hour  Intake 1929.17 ml  Output   1625 ml  Net 304.17 ml   Filed Weights   10/02/12 2359 10/03/12 0547  Weight: 70.308 kg (155 lb) 66.543 kg (146 lb 11.2 oz)    Exam:   General:  Pt in NAD, Alert and Awake  Cardiovascular: RRR, No mrg  Respiratory: cta bl, no wheezes  Abdomen: soft, NT  Musculoskeletal: no cyanosis or clubbing   Data Reviewed: Basic Metabolic Panel:  Recent Labs Lab 10/04/12 0510 10/04/12 0845 10/05/12 0415 10/05/12 2003 10/06/12 0351 10/07/12 0500  NA 129* 125* 131*  --  128* 128*  K 4.1 3.6 3.7  --  3.1* 3.8  CL 93* 93* 100  --  94* 95*  CO2 22 24 27   --  27 27  GLUCOSE 148* 186* 123*  --  129* 149*  BUN 10 11 6   --  6 9  CREATININE 1.10 1.17 0.82  --  0.78 0.74  CALCIUM 7.7* 7.2* 8.0*  --  8.5 8.4  MG  --   --  1.6  --  1.8  --   PHOS  --   --  1.5* 2.0*  --   --    Liver Function Tests:  Recent Labs Lab 10/03/12 0052 10/04/12 0510 10/04/12 0845 10/06/12 0351  AST 197*  125* 116* 87*  ALT 60* 47 44 39  ALKPHOS 232* 281* 208* 186*  BILITOT 2.3* 3.2* 3.1* 6.8*  PROT 6.4 5.9* 5.4* 6.2  ALBUMIN 2.0* 1.9* 1.8* 1.9*    Recent Labs Lab 10/03/12 0052  LIPASE 6*   No results found for this basename: AMMONIA,  in the last 168 hours CBC:  Recent Labs Lab 10/03/12 0052 10/04/12 0510 10/05/12 0415 10/06/12 0351  WBC 31.4* 2.5* 16.5* 11.4*  NEUTROABS 29.2*  --   --  9.0*  HGB 11.7* 10.9* 10.2* 11.4*  HCT 32.8* 32.0* 29.7* 32.8*  MCV 94.8 96.1 94.6 94.0  PLT 105* PLATELET CLUMPS NOTED ON SMEAR, COUNT APPEARS ADEQUATE 67* 62*   Cardiac Enzymes:  Recent Labs Lab 10/04/12 0845  TROPONINI <0.30   BNP (last 3 results) No results found for this basename: PROBNP,  in the last 8760  hours CBG:  Recent Labs Lab 10/06/12 1247 10/06/12 1824 10/06/12 2112 10/07/12 0735 10/07/12 1153  GLUCAP 120* 108* 135* 129* 131*    Recent Results (from the past 240 hour(s))  URINE CULTURE     Status: None   Collection Time    10/03/12  3:56 AM      Result Value Range Status   Specimen Description URINE, CLEAN CATCH   Final   Special Requests NONE   Final   Culture  Setup Time 10/03/2012 12:38   Final   Colony Count 9,000 COLONIES/ML   Final   Culture INSIGNIFICANT GROWTH   Final   Report Status 10/04/2012 FINAL   Final  CULTURE, BLOOD (ROUTINE X 2)     Status: None   Collection Time    10/03/12  6:10 AM      Result Value Range Status   Specimen Description BLOOD RIGHT ANTECUBITAL   Final   Special Requests BOTTLES DRAWN AEROBIC ONLY 3CC   Final   Culture  Setup Time 10/03/2012 09:28   Final   Culture     Final   Value: ESCHERICHIA COLI     Note: SUSCEPTIBILITIES PERFORMED ON PREVIOUS CULTURE WITHIN THE LAST 5 DAYS.     Note: Gram Stain Report Called to,Read Back By and Verified With: VERA JACKSON ON 10/04/2012 AT 12:20A BY WILEJ   Report Status 10/06/2012 FINAL   Final  CULTURE, BLOOD (ROUTINE X 2)     Status: None   Collection Time    10/03/12  6:15 AM      Result Value Range Status   Specimen Description BLOOD LEFT HAND   Final   Special Requests BOTTLES DRAWN AEROBIC ONLY 4CC   Final   Culture  Setup Time 10/03/2012 09:28   Final   Culture     Final   Value: ESCHERICHIA COLI     Note: Gram Stain Report Called to,Read Back By and Verified With: VERA JACKSON ON 10/04/2012 AT 12:20A BY WILEJ   Report Status 10/06/2012 FINAL   Final   Organism ID, Bacteria ESCHERICHIA COLI   Final  URINE CULTURE     Status: None   Collection Time    10/04/12  5:07 AM      Result Value Range Status   Specimen Description URINE, RANDOM   Final   Special Requests NONE   Final   Culture  Setup Time 10/04/2012 09:59   Final   Colony Count 85,000 COLONIES/ML   Final   Culture  ESCHERICHIA COLI   Final   Report Status 10/06/2012 FINAL   Final   Organism  ID, Bacteria ESCHERICHIA COLI   Final  MRSA PCR SCREENING     Status: None   Collection Time    10/04/12  5:50 AM      Result Value Range Status   MRSA by PCR NEGATIVE  NEGATIVE Final   Comment:            The GeneXpert MRSA Assay (FDA     approved for NASAL specimens     only), is one component of a     comprehensive MRSA colonization     surveillance program. It is not     intended to diagnose MRSA     infection nor to guide or     monitor treatment for     MRSA infections.  CULTURE, BLOOD (ROUTINE X 2)     Status: None   Collection Time    10/04/12  8:25 AM      Result Value Range Status   Specimen Description BLOOD LEFT ARM   Final   Special Requests BOTTLES DRAWN AEROBIC AND ANAEROBIC 2CC   Final   Culture  Setup Time 10/04/2012 10:59   Final   Culture     Final   Value:        BLOOD CULTURE RECEIVED NO GROWTH TO DATE CULTURE WILL BE HELD FOR 5 DAYS BEFORE ISSUING A FINAL NEGATIVE REPORT   Report Status PENDING   Incomplete  CULTURE, BLOOD (ROUTINE X 2)     Status: None   Collection Time    10/04/12  8:30 AM      Result Value Range Status   Specimen Description BLOOD LEFT ARM   Final   Special Requests BOTTLES DRAWN AEROBIC AND ANAEROBIC 1CC   Final   Culture  Setup Time 10/04/2012 10:59   Final   Culture     Final   Value:        BLOOD CULTURE RECEIVED NO GROWTH TO DATE CULTURE WILL BE HELD FOR 5 DAYS BEFORE ISSUING A FINAL NEGATIVE REPORT   Report Status PENDING   Incomplete     Studies: No results found.  Scheduled Meds: . antiseptic oral rinse  15 mL Mouth Rinse q12n4p  . chlorhexidine  15 mL Mouth Rinse BID  . ciprofloxacin  400 mg Intravenous Q12H  . feeding supplement  237 mL Oral TID BM  . folic acid  1 mg Oral Daily  . gabapentin  300 mg Oral TID  . metoprolol tartrate  12.5 mg Oral BID  . multivitamin with minerals  1 tablet Oral Daily  . nicotine  21 mg Transdermal Daily   . OxyCODONE  15 mg Oral Q12H  . sodium chloride  3 mL Intravenous Q12H  . thiamine  100 mg Oral Daily   Or  . thiamine  100 mg Intravenous Daily   Continuous Infusions: . sodium chloride 50 mL/hr at 10/07/12 0626  . DOBUTamine Stopped (10/04/12 1900)  .  sodium bicarbonate  infusion 1000 mL 50 mL/hr at 10/07/12 1044    Active Problems:   Leukocytosis   Chronic pancreatitis   AKI (acute kidney injury)   Alcoholic hepatitis   Hypoglycemia secondary to sulfonylurea   Hypokalemia   Gram-negative bacteremia    Time spent: > 35 minutes    Penny Pia  [facility] Pager [phone]. If 7PM-7AM, please contact night-coverage at [email or web address], password [facility] 10/07/2012, 1:56 PM  LOS: 5 days

## 2012-10-07 NOTE — Care Management Note (Addendum)
    Page 1 of 2   10/08/2012     4:20:42 PM   CARE MANAGEMENT NOTE 10/08/2012  Patient:  Dennis Zhang, Dennis Zhang   Account Number:  [id number]  Date Initiated:  10/04/2012  Documentation initiated by:  DAVIS,RHONDA  Subjective/Objective Assessment:   pt transferred to icu due to increased confusion, temp, and poss sepsis versus etoph withdrawal or both.     Action/Plan:   is from home lives alone but does have support group   Anticipated DC Date:  10/08/2012   Anticipated DC Plan:  HOME W HOME HEALTH SERVICES  In-house referral  NA      DC Planning Services  CM consult      PAC Choice  NA   Choice offered to / List presented to:  C-1 Patient   DME arranged  WALKER - ROLLING  BEDSIDE COMMODE      DME agency  [facility]     HH arranged  HH-1 RN  HH-4 NURSE'S AIDE      HH agency  [facility]   Status of service:  Completed, signed off Medicare Important Message given?  NA - LOS <3 / Initial given by admissions (If response is "NO", the following Medicare IM given date fields will be blank) Date Medicare IM given:   Date Additional Medicare IM given:    Discharge Disposition:  HOME W HOME HEALTH SERVICES  Per UR Regulation:  Reviewed for med. necessity/level of care/duration of stay  If discussed at Long Length of Stay Meetings, dates discussed:    Comments:  10/08/12 Twila Rappa RN,BSN NCM [phone] [facility] CHOSEN FOR HHRN,AIDE.KRISTEN AWARE OF D/C.   10/07/12 Karleen Seebeck RN,BSN NCM [phone] NO D/C NEEDS OR ORDERS.  [id number] Earlene Plater, RN, BSN, CCM:  CHART REVIEWED AND UPDATED.  Next chart review due on [id number]. NO DISCHARGE NEEDS PRESENT AT THIS TIME. CASE MANAGEMENT [phone]

## 2012-10-08 ENCOUNTER — Encounter: Payer: Self-pay | Admitting: Physical Medicine & Rehabilitation

## 2012-10-08 DIAGNOSIS — E876 Hypokalemia: Secondary | ICD-10-CM

## 2012-10-08 LAB — GLUCOSE, CAPILLARY: Glucose-Capillary: 101 mg/dL — ABNORMAL HIGH (ref 70–99)

## 2012-10-08 LAB — BASIC METABOLIC PANEL
BUN: 9 mg/dL (ref 6–23)
CO2: 28 mEq/L (ref 19–32)
Calcium: 9 mg/dL (ref 8.4–10.5)
Chloride: 91 mEq/L — ABNORMAL LOW (ref 96–112)
Creatinine, Ser: 0.65 mg/dL (ref 0.50–1.35)
GFR calc Af Amer: >90 mL/min (ref >90)
GFR calc non Af Amer: >90 mL/min (ref >90)
Glucose, Bld: 133 mg/dL — ABNORMAL HIGH (ref 70–99)
Potassium: 3.5 mEq/L (ref 3.5–5.1)
Sodium: 128 mEq/L — ABNORMAL LOW (ref 135–145)

## 2012-10-08 MED ORDER — METOPROLOL TARTRATE 12.5 MG HALF TABLET: 12.5000 mg | ORAL_TABLET | Freq: Two times a day (BID) | ORAL | Status: DC

## 2012-10-08 MED ORDER — CIPROFLOXACIN HCL 500 MG PO TABS: 500.0000 mg | ORAL_TABLET | Freq: Two times a day (BID) | ORAL | Status: DC

## 2012-10-08 MED ORDER — OXYCODONE HCL ER 15 MG PO T12A: 15.0000 mg | EXTENDED_RELEASE_TABLET | Freq: Two times a day (BID) | ORAL | Status: DC

## 2012-10-08 MED ORDER — FOLIC ACID 1 MG PO TABS: 1.0000 mg | ORAL_TABLET | Freq: Every day | ORAL | Status: DC

## 2012-10-08 MED ORDER — GLUCERNA SHAKE PO LIQD: 237.0000 mL | Freq: Three times a day (TID) | ORAL | Status: DC

## 2012-10-08 MED ORDER — CIPROFLOXACIN HCL 500 MG PO TABS
500.0000 mg | ORAL_TABLET | Freq: Two times a day (BID) | ORAL | Status: DC
  Administered 2012-10-08: 500 mg via ORAL
  Filled 2012-10-08 (×3): qty 1

## 2012-10-08 NOTE — Discharge Summary (Signed)
Physician Discharge Summary  Dennis Zhang:[id number] DOB: 1956/03/28 DOA: 10/02/2012  PCP: [name], MD  Admit date: 10/02/2012 Discharge date: 10/08/2012  Time spent: > 35 minutes  Recommendations for Outpatient Follow-up:  1. Please be sure to follow up with your primary care physician in 1-2 weeks or sooner 2. Also you will be discharged with a rolling walker.  3. Please discuss physical therapy with your pcp as unfortunately we were not able to set up physical therapy for you while in house due to insurance limitations (Although I will still place order) per my discussion with the social worker.  Discharge Diagnoses:  Active Problems:   Leukocytosis   Chronic pancreatitis   AKI (acute kidney injury)   Alcoholic hepatitis   Hypoglycemia secondary to sulfonylurea   Hypokalemia   Gram-negative bacteremia   Discharge Condition: stable  Diet recommendation: Low sodium/heart healthy  Filed Weights   10/02/12 2359 10/03/12 0547  Weight: 70.308 kg (155 lb) 66.543 kg (146 lb 11.2 oz)    History of present illness:  BRIEF PATIENT DESCRIPTION: 57 yo alcoholic, hx chronic pancreatitis, DM, HTN, chronic pain. Presented w back pain and hypoglycemia. Admitted 5/15 w apparent EtOH hepatitis, acute renal insufficiency, leukocytosis. Progressed to septic shock, source GNR bacteremia. To ICU am 5/16    Hospital Course:  1. Gram negative bacteremia - Grew out e coli  - currently on Cipro and WBC trending down  - Source unknown at this point   2. Sepsis  - Resolved. Currently on Cipro which covers his gram negative bacteremia - Patient is to complete 10 more days of Cipro to complete a 14 day course of antibiotics.  3. Alcoholic liver cirrhosis  - Discussed abstinence with patient and he verbalizes agreement  - no abdominal discomfort today   4. Alcohol Dependence  - On [place] protocol while in house - recommended alcohol cessation upon discharge. Patient agreeable - No  signs of anxiety on discharge.  5. Hypokalemia  - Resolved after oral replacement - Most likely due to poor oral intake.  6. Chronic pancreatitis  - stable currently   7. Hyponatremia  - Likely due to Alcoholic liver cirrhosis and poor oral intake given recent sepsis  - Should improve with continued improvement in oral intake.   Procedures:  Had L IJ placed 5/16  Consultations:  Was seen initially by PCCM  Discharge Exam: Filed Vitals:   10/07/12 0518 10/07/12 1416 10/07/12 2129 10/08/12 0502  BP: 155/90 143/82 163/86 135/81  Pulse: 100 96 91 65  Temp: 98.5 F (36.9 C) 99.8 F (37.7 C) 100 F (37.8 C) 99.9 F (37.7 C)  TempSrc: Oral Oral Oral Oral  Resp: 18 16 18 19   Height:      Weight:      SpO2: 100% 100% 100% 100%    General: Pt in NAD, Alert and Awake Cardiovascular: RRR, No MRG Respiratory: CTA BL, no wheezes  Discharge Instructions  Discharge Orders   Future Orders Complete By Expires     Call MD for:  severe uncontrolled pain  As directed     Call MD for:  temperature >100.4  As directed     Diet - low sodium heart healthy  As directed     Discharge instructions  As directed     Comments:      Please continue to take your antibiotic Cipro for the next 10 days.  Also follow up with your primary care physician in 1-2 weeks or sooner should  any new concerns arise.    Increase activity slowly  As directed         Medication List    STOP taking these medications       ALPRAZolam 1 MG tablet  Commonly known as:  XANAX     cyclobenzaprine 5 MG tablet  Commonly known as:  FLEXERIL     glipiZIDE 5 MG tablet  Commonly known as:  GLUCOTROL     lisinopril 5 MG tablet  Commonly known as:  PRINIVIL,ZESTRIL     traZODone 150 MG tablet  Commonly known as:  DESYREL      TAKE these medications       ciprofloxacin 500 MG tablet  Commonly known as:  CIPRO  Take 1 tablet (500 mg total) by mouth 2 (two) times daily.     feeding supplement Liqd   Take 237 mLs by mouth 3 (three) times daily between meals.     folic acid 1 MG tablet  Commonly known as:  FOLVITE  Take 1 tablet (1 mg total) by mouth daily.     gabapentin 300 MG capsule  Commonly known as:  NEURONTIN  Take 1 capsule (300 mg total) by mouth 3 (three) times daily.     metoprolol tartrate 12.5 mg Tabs  Commonly known as:  LOPRESSOR  Take 0.5 tablets (12.5 mg total) by mouth 2 (two) times daily.     omeprazole 20 MG capsule  Commonly known as:  PRILOSEC  Take 20 mg by mouth daily.     OxyCODONE 15 mg T12a  Commonly known as:  OXYCONTIN  Take 1 tablet (15 mg total) by mouth every 12 (twelve) hours.     simvastatin 20 MG tablet  Commonly known as:  ZOCOR  Take 0.5 tablets (10 mg total) by mouth at bedtime.       Allergies  Allergen Reactions  . Tylenol (Acetaminophen) Nausea Only    States can take Tylenol if has other pain med w/it - like Hydrocodone      The results of significant diagnostics from this hospitalization (including imaging, microbiology, ancillary and laboratory) are listed below for reference.    Significant Diagnostic Studies: Dg Eye Foreign Body  10/03/2012   *RADIOLOGY REPORT*  Clinical Data: Metal exposure to the lies, pre MRI.  ORBITS FOR FOREIGN BODY - 2 VIEW  Comparison: None.  Findings: Fabric type curvilinear striations project over the orbits, likely from some sort of clothing.  No metal foreign body is observed over the orbits.  IMPRESSION: No significant abnormal metal foreign body projects over the orbits to indicate a contraindication to MRI.   Original Report Authenticated By: Gaylyn Rong, M.D.   Dg Cervical Spine Complete  10/03/2012   *RADIOLOGY REPORT*  Clinical Data: Hypoglycemia.  Posterior neck pain.  Fall.  CERVICAL SPINE - COMPLETE 4+ VIEW  Comparison: None.  Findings: Degenerative disc disease.  Large anterior osteophytes at C2-3, C3-4 and C6-7.  Mild degenerative facet disease bilaterally. Mild bilateral neural  foraminal narrowing at C4-5.  Normal alignment.  No fracture.  Prevertebral soft tissues are normal.  IMPRESSION: Degenerative changes.  No acute findings.   Original Report Authenticated By: Charlett Nose, M.D.   Dg Lumbar Spine Complete  10/03/2012   *RADIOLOGY REPORT*  Clinical Data: Fall.  LUMBAR SPINE - COMPLETE 4+ VIEW  Comparison: 09/02/2011  Findings: Normal alignment.  Degenerative facet disease in the lower lumbar spine.  No fracture.  SI joints are symmetric and unremarkable.  Calcifications are again  noted near the midline of the abdomen compatible with chronic pancreatitis.  IMPRESSION: No acute bony abnormality.  Chronic pancreatitis.   Original Report Authenticated By: Charlett Nose, M.D.   Mr Abdomen Wo Contrast  10/03/2012   *RADIOLOGY REPORT*  Clinical Data: Evaluate pancreatic duct dilatation  MRI ABDOMEN WITHOUT CONTRAST  Technique:  Multiplanar multisequence MR imaging of the abdomen was performed. No intravenous contrast was administered.  Comparison: Ultrasound 10/03/2012  Findings: Exam detail diminish due to motion artifact.  There is no pleural or pericardial effusion identified.  Marked diffuse fatty infiltration of the liver parenchyma noted.  No focal liver abnormalities identified.  The gallbladder appears normal.  The common bile duct is normal in caliber measuring 5 mm. Focal area of ectasia of the pancreatic duct in the region of the head and neck measures up to 7 mm.  This tapers distally to a normal caliber of 3 mm.  Within the tail of the pancreas the duct measures up to 3 mm.  No mass or obstructing stone is identified. No focal pancreatic fluid collections identified.  The spleen appears within normal limits.  The adrenal glands are both normal. Normal appearance of the kidneys.  There is no free fluid within the upper abdomen.  No adenopathy noted.  The bowel loops within the upper abdomen are unremarkable.  IMPRESSION:  1.  Focal ectasia of the pancreatic duct at the level  of the head and neck.  According to clinical record the patient has a history of chronic pancreatitis.  The focal pancreatic duct dilatation likely is the sequela of chronic pancreatitis and reflects pancreatic duct stricture formation.  No obstructing mass or stone identified. 2.  Fatty infiltration of the liver.   Original Report Authenticated By: Signa Kell, M.D.   US Abdomen Complete  10/03/2012   *RADIOLOGY REPORT*  Clinical Data:  Right upper quadrant abdominal pain  COMPLETE ABDOMINAL ULTRASOUND  Comparison:  None.  Findings:  Gallbladder:  Gallstones.  No gallbladder wall thickening. Negative sonographic Murphy's sign.  Common bile duct:  Normal diameter of 4 mm.  Liver:  Diffusely increased in echogenicity.  Focal lesion detection is limited in this setting.  IVC:  Appears normal.  Pancreas:  Cerebral pancreatic ductal dilatation of the 8 mm.  An obstructing lesion is not visualized.  Spleen:  Measures 5 cm.  No focal abnormality.  Right Kidney:  Measures 12 cm.  No hydronephrosis or focal abnormality.  Left Kidney:  Measures 12.1 cm.  No hydronephrosis or focal abnormality.  Abdominal aorta:  No aneurysm identified.  IMPRESSION: Gallstones without sonographic evidence for acute cholecystitis.  Hepatic steatosis.  Nonspecific pancreatic ductal dilatation.  Recommend further evaluation with pancreas protocol CT or MRI.   Original Report Authenticated By: Jearld Lesch, M.D.   Ct Abdomen Pelvis W Contrast  10/04/2012   *RADIOLOGY REPORT*  Clinical Data: Abdominal pain, sepsis  CT ABDOMEN AND PELVIS WITH CONTRAST  Technique:  Multidetector CT imaging of the abdomen and pelvis was performed following the standard protocol during bolus administration of intravenous contrast.  Contrast: OMNIPAQUE IOHEXOL 300 MG/ML  SOLN  Comparison: MRI abdomen 10/03/2012  Findings: The sagittal images of the spine shows degenerative changes thoracolumbar spine.  No destructive bony lesions are noted.  Lung  bases shows patchy atelectasis or infiltrate left lower lobe posteriorly.  Fatty infiltration of the liver.  No intrahepatic biliary ductal dilatation.  No focal hepatic mass.  Small layering calcified gallstones are noted in dependent gallbladder.  CBD  measures 6 mm in diameter.  Again noted extensive pancreatic calcifications consistent with chronic calcific pancreatitis.  Again noted dilatation of the main pancreatic duct in the region of the head and body of pancreas up to 8 mm.  This is probable sequela of chronic pancreatitis with underlying stricture.  No focal pancreatic mass.  The spleen is small size measures about 5.4 cm length.  Kidneys are symmetrical in size and enhancement.  Adrenal glands are unremarkable.  No hydronephrosis or hydroureter. Atherosclerotic calcifications of the abdominal aorta and the iliac arteries.  No aortic aneurysm.  Significant stool noted in the right colon.  There is no pericecal inflammation.  Some fecal like material noted in terminal ileum most likely due to incompetent ileocecal valve.  No small bowel obstruction.  Moderate stool and gas noted within rectum and distal sigmoid colon.  Prostate gland and seminal vesicles are unremarkable.  Tiny punctate calcification within prostate gland measures 2.5 mm. No calcified calculi are noted within urinary bladder.  No inguinal adenopathy.  No destructive bony lesions are noted within pelvis.  Delayed renal images shows bilateral renal symmetrical excretion. Bilateral visualized proximal ureter is unremarkable.  IMPRESSION:  1.  Again noted fatty infiltration of the liver.  No intrahepatic biliary ductal dilatation. 2.  Tiny calcified layering gallstones are noted within gallbladder.  3.  Again noted chronic calcific pancreatitis.  Again noted dilatation of the main pancreatic duct up to 8 mm. 4.  No hydronephrosis or hydroureter. 5.  Abundant stool noted within the right colon and cecum. 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 6.   No distal colonic obstruction. 7.  There is atelectasis or infiltrate in the left lower lobe posteriorly.   Original Report Authenticated By: Natasha Mead, M.D.   Dg Chest Port 1 View  10/04/2012   *RADIOLOGY REPORT*  Clinical Data: Central line placement  PORTABLE CHEST - 1 VIEW  Comparison: 10/04/2012  Findings: Left jugular catheter has been placed with the tip in the SVC.  No pneumothorax.  Mild left lower lobe consolidation unchanged.  No heart failure. Apical emphysema noted bilaterally.  IMPRESSION: Satisfactory central venous catheter placement  Mild left lower lobe consolidation, unchanged.   Original Report Authenticated By: Janeece Riggers, M.D.   Dg Chest Port 1 View  10/04/2012   *RADIOLOGY REPORT*  Clinical Data: Fever  PORTABLE CHEST - 1 VIEW  Comparison: 10/11/2010  Findings: Mild left lung base opacity.  Mild aortic tortuosity. Heart size upper normal.  No pleural effusion or pneumothorax. External artifact obscures the right shoulder.  Multilevel degenerative change.  IMPRESSION: Mild left lung base opacity; atelectasis versus infiltrate.   Original Report Authenticated By: Jearld Lesch, M.D.    Microbiology: Recent Results (from the past 240 hour(s))  URINE CULTURE     Status: None   Collection Time    10/03/12  3:56 AM      Result Value Range Status   Specimen Description URINE, CLEAN CATCH   Final   Special Requests NONE   Final   Culture  Setup Time 10/03/2012 12:38   Final   Colony Count 9,000 COLONIES/ML   Final   Culture INSIGNIFICANT GROWTH   Final   Report Status 10/04/2012 FINAL   Final  CULTURE, BLOOD (ROUTINE X 2)     Status: None   Collection Time    10/03/12  6:10 AM      Result Value Range Status   Specimen Description BLOOD RIGHT ANTECUBITAL   Final   Special Requests BOTTLES  DRAWN AEROBIC ONLY 3CC   Final   Culture  Setup Time 10/03/2012 09:28   Final   Culture     Final   Value: ESCHERICHIA COLI     Note: SUSCEPTIBILITIES PERFORMED ON PREVIOUS CULTURE  WITHIN THE LAST 5 DAYS.     Note: Gram Stain Report Called to,Read Back By and Verified With: VERA JACKSON ON 10/04/2012 AT 12:20A BY WILEJ   Report Status 10/06/2012 FINAL   Final  CULTURE, BLOOD (ROUTINE X 2)     Status: None   Collection Time    10/03/12  6:15 AM      Result Value Range Status   Specimen Description BLOOD LEFT HAND   Final   Special Requests BOTTLES DRAWN AEROBIC ONLY 4CC   Final   Culture  Setup Time 10/03/2012 09:28   Final   Culture     Final   Value: ESCHERICHIA COLI     Note: Gram Stain Report Called to,Read Back By and Verified With: VERA JACKSON ON 10/04/2012 AT 12:20A BY WILEJ   Report Status 10/06/2012 FINAL   Final   Organism ID, Bacteria ESCHERICHIA COLI   Final  URINE CULTURE     Status: None   Collection Time    10/04/12  5:07 AM      Result Value Range Status   Specimen Description URINE, RANDOM   Final   Special Requests NONE   Final   Culture  Setup Time 10/04/2012 09:59   Final   Colony Count 85,000 COLONIES/ML   Final   Culture ESCHERICHIA COLI   Final   Report Status 10/06/2012 FINAL   Final   Organism ID, Bacteria ESCHERICHIA COLI   Final  MRSA PCR SCREENING     Status: None   Collection Time    10/04/12  5:50 AM      Result Value Range Status   MRSA by PCR NEGATIVE  NEGATIVE Final   Comment:            The GeneXpert MRSA Assay (FDA     approved for NASAL specimens     only), is one component of a     comprehensive MRSA colonization     surveillance program. It is not     intended to diagnose MRSA     infection nor to guide or     monitor treatment for     MRSA infections.  CULTURE, BLOOD (ROUTINE X 2)     Status: None   Collection Time    10/04/12  8:25 AM      Result Value Range Status   Specimen Description BLOOD LEFT ARM   Final   Special Requests BOTTLES DRAWN AEROBIC AND ANAEROBIC 2CC   Final   Culture  Setup Time 10/04/2012 10:59   Final   Culture     Final   Value:        BLOOD CULTURE RECEIVED NO GROWTH TO DATE CULTURE  WILL BE HELD FOR 5 DAYS BEFORE ISSUING A FINAL NEGATIVE REPORT   Report Status PENDING   Incomplete  CULTURE, BLOOD (ROUTINE X 2)     Status: None   Collection Time    10/04/12  8:30 AM      Result Value Range Status   Specimen Description BLOOD LEFT ARM   Final   Special Requests BOTTLES DRAWN AEROBIC AND ANAEROBIC 1CC   Final   Culture  Setup Time 10/04/2012 10:59   Final   Culture  Final   Value:        BLOOD CULTURE RECEIVED NO GROWTH TO DATE CULTURE WILL BE HELD FOR 5 DAYS BEFORE ISSUING A FINAL NEGATIVE REPORT   Report Status PENDING   Incomplete     Labs: Basic Metabolic Panel:  Recent Labs Lab 10/04/12 0845 10/05/12 0415 10/05/12 2003 10/06/12 0351 10/07/12 0500 10/08/12 0511  NA 125* 131*  --  128* 128* 128*  K 3.6 3.7  --  3.1* 3.8 3.5  CL 93* 100  --  94* 95* 91*  CO2 24 27  --  27 27 28   GLUCOSE 186* 123*  --  129* 149* 133*  BUN 11 6  --  6 9 9   CREATININE 1.17 0.82  --  0.78 0.74 0.65  CALCIUM 7.2* 8.0*  --  8.5 8.4 9.0  MG  --  1.6  --  1.8  --   --   PHOS  --  1.5* 2.0*  --   --   --    Liver Function Tests:  Recent Labs Lab 10/03/12 0052 10/04/12 0510 10/04/12 0845 10/06/12 0351  AST 197* 125* 116* 87*  ALT 60* 47 44 39  ALKPHOS 232* 281* 208* 186*  BILITOT 2.3* 3.2* 3.1* 6.8*  PROT 6.4 5.9* 5.4* 6.2  ALBUMIN 2.0* 1.9* 1.8* 1.9*    Recent Labs Lab 10/03/12 0052  LIPASE 6*   No results found for this basename: AMMONIA,  in the last 168 hours CBC:  Recent Labs Lab 10/03/12 0052 10/04/12 0510 10/05/12 0415 10/06/12 0351  WBC 31.4* 2.5* 16.5* 11.4*  NEUTROABS 29.2*  --   --  9.0*  HGB 11.7* 10.9* 10.2* 11.4*  HCT 32.8* 32.0* 29.7* 32.8*  MCV 94.8 96.1 94.6 94.0  PLT 105* PLATELET CLUMPS NOTED ON SMEAR, COUNT APPEARS ADEQUATE 67* 62*   Cardiac Enzymes:  Recent Labs Lab 10/04/12 0845  TROPONINI <0.30   BNP: BNP (last 3 results) No results found for this basename: PROBNP,  in the last 8760 hours CBG:  Recent Labs Lab  10/07/12 1153 10/07/12 1717 10/07/12 2206 10/08/12 0742 10/08/12 1146  GLUCAP 131* 127* 86 101* 136*       Signed:  Penny Pia  [facility] 10/08/2012, 12:35 PM

## 2012-10-08 NOTE — Progress Notes (Signed)
Patient loss IV access and IV team was unable to obtain a new IV.  Notifed NP on call and IV Cipro was changed to PO.  IV team recommends some type of line if patient will continue to get IV medications.  Will continue to monitor patient.

## 2012-10-08 NOTE — Progress Notes (Signed)
Physical Therapy Treatment Patient Details Name: Dennis Zhang MRN: [id number] DOB: [birth year] Today's Date: 10/08/2012 Time: 1204-1218 PT Time Calculation (min): 14 min  PT Assessment / Plan / Recommendation Comments on Treatment Session  Multiple falls recently, c/o sore neck and R hip hip.  Stated he amb with a cane prior.  Would benefit from using a RW instead.  Walker delivered and in room.  Pt plans to D/C to home with assistance.    Follow Up Recommendations  Home health PT     Does the patient have the potential to tolerate intense rehabilitation     Barriers to Discharge        Equipment Recommendations  Rolling walker with 5" wheels    Recommendations for Other Services    Frequency Min 3X/week   Plan      Precautions / Restrictions Precautions Precautions: Fall Restrictions Weight Bearing Restrictions: No   Pertinent Vitals/Pain C/o neck "stiffness"    Mobility  Bed Mobility Bed Mobility: Supine to Sit Supine to Sit: HOB elevated;With rails;4: Min assist Details for Bed Mobility Assistance: Assist for L LE off bed and trunk to upright. Increased time.  Transfers Transfers: Sit to Stand;Stand to Sit Sit to Stand: 4: Min assist;From bed;From elevated surface Stand to Sit: 4: Min assist;To chair/3-in-1 Details for Transfer Assistance: VCs safety, technique, hand placement. Assist to rise, stabilize, control descent.  Ambulation/Gait Ambulation/Gait Assistance: 4: Min assist Ambulation Distance (Feet): 25 Feet Assistive device: Rolling walker Ambulation/Gait Assistance Details: 50% VC's on proper walker to self distance and upright posture as pt tends to advance AD too far pushing upper body forward.  Multiple standing rest breaks.  50% VC's on safety with turns.  Gait Pattern: Step-through pattern;Trunk flexed;Decreased stride length;Narrow base of support General Gait Details: unsteady    PT Goals                                                           progressing    Visit Information  Last PT Received On: 10/08/12 Assistance Needed: +1    Subjective Data  Subjective: feels good to get up and move Patient Stated Goal: home   Cognition       Balance   poor+  End of Session PT - End of Session Equipment Utilized During Treatment: Gait belt Activity Tolerance: Patient limited by fatigue;Patient limited by pain Patient left: in chair;with call bell/phone within reach;with chair alarm set Nurse Communication: Mobility status   Felecia Shelling  PTA [facility]  Acute  Rehab Pager      [phone]

## 2012-10-10 LAB — CULTURE, BLOOD (ROUTINE X 2): Culture: NO GROWTH

## 2012-10-18 ENCOUNTER — Emergency Department (HOSPITAL_COMMUNITY)
Admission: EM | Admit: 2012-10-18 | Discharge: 2012-10-18 | Disposition: A | Discharge status: Home / Self Care | Payer: Medicaid Other | Attending: Emergency Medicine | Admitting: Emergency Medicine

## 2012-10-18 ENCOUNTER — Encounter (HOSPITAL_COMMUNITY): Payer: Self-pay | Admitting: Emergency Medicine

## 2012-10-18 DIAGNOSIS — M542 Cervicalgia: Secondary | ICD-10-CM | POA: Insufficient documentation

## 2012-10-18 DIAGNOSIS — M549 Dorsalgia, unspecified: Secondary | ICD-10-CM | POA: Insufficient documentation

## 2012-10-18 DIAGNOSIS — Z9181 History of falling: Secondary | ICD-10-CM | POA: Insufficient documentation

## 2012-10-18 DIAGNOSIS — Z79899 Other long term (current) drug therapy: Secondary | ICD-10-CM | POA: Insufficient documentation

## 2012-10-18 DIAGNOSIS — I10 Essential (primary) hypertension: Secondary | ICD-10-CM | POA: Insufficient documentation

## 2012-10-18 DIAGNOSIS — M129 Arthropathy, unspecified: Secondary | ICD-10-CM | POA: Insufficient documentation

## 2012-10-18 DIAGNOSIS — F17200 Nicotine dependence, unspecified, uncomplicated: Secondary | ICD-10-CM | POA: Insufficient documentation

## 2012-10-18 DIAGNOSIS — M25473 Effusion, unspecified ankle: Secondary | ICD-10-CM | POA: Insufficient documentation

## 2012-10-18 DIAGNOSIS — M25476 Effusion, unspecified foot: Secondary | ICD-10-CM | POA: Insufficient documentation

## 2012-10-18 DIAGNOSIS — E119 Type 2 diabetes mellitus without complications: Secondary | ICD-10-CM | POA: Insufficient documentation

## 2012-10-18 DIAGNOSIS — G8929 Other chronic pain: Secondary | ICD-10-CM | POA: Insufficient documentation

## 2012-10-18 MED ORDER — HYDROCODONE-ACETAMINOPHEN 5-325 MG PO TABS: 2.0000 | ORAL_TABLET | ORAL | Status: DC | PRN

## 2012-10-18 MED ORDER — HYDROCODONE-ACETAMINOPHEN 5-325 MG PO TABS
2.0000 | ORAL_TABLET | Freq: Once | ORAL | Status: AC
  Administered 2012-10-18: 2 via ORAL
  Filled 2012-10-18: qty 2

## 2012-10-18 NOTE — ED Notes (Signed)
Patient states he is out of his Rx pain meds at home. Patient states he is seen at the [place] and needs medication to get him through til he can be seen at the [place].

## 2012-10-18 NOTE — ED Provider Notes (Signed)
History     CSN: [id number]  Arrival date & time 10/18/12  0444   First MD Initiated Contact with Patient 10/18/12 0542      Chief Complaint  Patient presents with  . Back Pain  . Joint Swelling    L    (Consider location/radiation/quality/duration/timing/severity/associated sxs/prior treatment) HPI 57 yo male presents to the ER from home via EMS with complaint of neck and back pain.  Pt with h/o chronic pain, [facility].  Pt with recent admission for hypoglycemia, falls and found to have GNR sepsis.  Pt reports he has been doing well at home until the last 2 days when he ran out of his hydrocodone.  Pt was d/c from hospital with oxycontin, but does not like the way it makes him feel.  No incontinence, no weakness, no numbness.  No change in his chronic pain.  Pt also reports some ankle swelling left greater than right.  He reports he has not been as mobile as usual, swelling improves with elevation. Past Medical History  Diagnosis Date  . Chronic pain   . Arthritis   . Diabetes mellitus without complication   . Hypertension   . Chronic pain     Past Surgical History  Procedure Laterality Date  . Abdominal surgery      History reviewed. No pertinent family history.  History  Substance Use Topics  . Smoking status: Current Every Day Smoker    Types: Cigarettes  . Smokeless tobacco: Not on file  . Alcohol Use: Yes     Comment: daily      Review of Systems  All other systems reviewed and are negative.    Allergies  Tylenol  Home Medications   Current Outpatient Rx  Name  Route  Sig  Dispense  Refill  . ciprofloxacin (CIPRO) 500 MG tablet   Oral   Take 1 tablet (500 mg total) by mouth 2 (two) times daily.   20 tablet   0   . feeding supplement (GLUCERNA SHAKE) LIQD   Oral   Take 237 mLs by mouth 3 (three) times daily between meals.   60 Can   0   . folic acid (FOLVITE) 1 MG tablet   Oral  Take 1 tablet (1 mg total) by mouth daily.   30 tablet   0   . gabapentin (NEURONTIN) 300 MG capsule   Oral   Take 1 capsule (300 mg total) by mouth 3 (three) times daily.   45 capsule   0   . HYDROcodone-acetaminophen (NORCO/VICODIN) 5-325 MG per tablet   Oral   Take 2 tablets by mouth every 4 (four) hours as needed for pain.   30 tablet   0   . metoprolol tartrate (LOPRESSOR) 12.5 mg TABS   Oral   Take 0.5 tablets (12.5 mg total) by mouth 2 (two) times daily.   60 tablet   0   . omeprazole (PRILOSEC) 20 MG capsule   Oral   Take 20 mg by mouth daily.         . OxyCODONE (OXYCONTIN) 15 mg T12A   Oral   Take 1 tablet (15 mg total) by mouth every 12 (twelve) hours.   30 tablet   0   . simvastatin (ZOCOR) 20 MG tablet   Oral   Take 0.5 tablets (10 mg total) by mouth at bedtime.   30 tablet   0     BP  98/57  Pulse 74  Temp(Src) 98.9 F (37.2 C) (Oral)  Resp 20  SpO2 96%  Physical Exam  Nursing note and vitals reviewed. Constitutional: He is oriented to person, place, and time. He appears well-developed and well-nourished. No distress.  HENT:  Head: Normocephalic and atraumatic.  Nose: Nose normal.  Mouth/Throat: Oropharynx is clear and moist.  Eyes: Conjunctivae and EOM are normal. Pupils are equal, round, and reactive to light.  Neck: Normal range of motion. Neck supple. No JVD present. No tracheal deviation present. No thyromegaly present.  Cardiovascular: Normal rate, regular rhythm, normal heart sounds and intact distal pulses.  Exam reveals no gallop and no friction rub.   No murmur heard. Pulmonary/Chest: Effort normal and breath sounds normal. No stridor. No respiratory distress. He has no wheezes. He has no rales. He exhibits no tenderness.  Abdominal: Soft. Bowel sounds are normal. He exhibits no distension and no mass. There is no tenderness. There is no rebound and no guarding.  Musculoskeletal: Normal range of motion. He exhibits tenderness  (diffuse tenderness to entire spine and paraspinal regions.  no stepoff or crepitus). He exhibits no edema.  Lymphadenopathy:    He has no cervical adenopathy.  Neurological: He is alert and oriented to person, place, and time. He has normal reflexes. No cranial nerve deficit. He exhibits normal muscle tone. Coordination normal.  Skin: Skin is warm and dry. No rash noted. He is not diaphoretic. No erythema. No pallor.  Psychiatric: He has a normal mood and affect. His behavior is normal. Judgment and thought content normal.    ED Course  Procedures (including critical care time)  Labs Reviewed  GLUCOSE, CAPILLARY - Abnormal; Notable for the following:    Glucose-Capillary 124 (*)    All other components within normal limits   No results found.   1. Chronic back pain       MDM  57 year old male with chronic neck and back pain.  [facility].  Recent hospitalization for hypoglycemia and gram-negative sepsis, patient feeling better since being home.  He was discharged with OxyContin, which he does not like to take.  He would prefer to take Vicodin as that is less strong for his back pain.  Will provide pain medication until his next followup appointment next week       Olivia Mackie, MD 10/18/12 2131

## 2012-10-18 NOTE — ED Notes (Signed)
[id number]<AV> Expected date:<BR> Expected time:<BR> Means of arrival:<BR> Comments:<BR> EMS 56yo M, c/o pain to back; s/p fall was seen after fall on 5/6

## 2012-10-18 NOTE — ED Notes (Signed)
Per EMS patient from home. Patient initially had a fall 10/04/12, patient c/o continued low back pain. Patient also reports neck pain and stiffness, patient states he can not turn his head. Patient also c/o L ankle swelling with tenderness. Patient brought walker from home.

## 2012-10-25 ENCOUNTER — Encounter: Payer: Self-pay | Admitting: Physical Medicine & Rehabilitation

## 2012-10-25 ENCOUNTER — Encounter: Payer: Medicaid Other | Attending: Physical Medicine & Rehabilitation

## 2012-10-25 ENCOUNTER — Ambulatory Visit (HOSPITAL_BASED_OUTPATIENT_CLINIC_OR_DEPARTMENT_OTHER): Payer: Medicaid Other | Admitting: Physical Medicine & Rehabilitation

## 2012-10-25 VITALS — BP 141/92 | HR 100 | Resp 14 | Ht 71.0 in | Wt 149.0 lb

## 2012-10-25 DIAGNOSIS — Z5181 Encounter for therapeutic drug level monitoring: Secondary | ICD-10-CM

## 2012-10-25 DIAGNOSIS — G8929 Other chronic pain: Secondary | ICD-10-CM | POA: Insufficient documentation

## 2012-10-25 DIAGNOSIS — M24552 Contracture, left hip: Secondary | ICD-10-CM

## 2012-10-25 DIAGNOSIS — R209 Unspecified disturbances of skin sensation: Secondary | ICD-10-CM | POA: Insufficient documentation

## 2012-10-25 DIAGNOSIS — M24559 Contracture, unspecified hip: Secondary | ICD-10-CM

## 2012-10-25 DIAGNOSIS — Z79899 Other long term (current) drug therapy: Secondary | ICD-10-CM

## 2012-10-25 DIAGNOSIS — G894 Chronic pain syndrome: Secondary | ICD-10-CM

## 2012-10-25 DIAGNOSIS — M1610 Unilateral primary osteoarthritis, unspecified hip: Secondary | ICD-10-CM | POA: Insufficient documentation

## 2012-10-25 DIAGNOSIS — M549 Dorsalgia, unspecified: Secondary | ICD-10-CM

## 2012-10-25 DIAGNOSIS — M25519 Pain in unspecified shoulder: Secondary | ICD-10-CM | POA: Insufficient documentation

## 2012-10-25 MED ORDER — TRAMADOL HCL 50 MG PO TABS: 50.0000 mg | ORAL_TABLET | Freq: Three times a day (TID) | ORAL | Status: DC | PRN

## 2012-10-25 NOTE — Progress Notes (Signed)
Subjective:    Patient ID: Dennis Zhang, male    DOB: 1955/08/17, 57 y.o.   MRN: [id number]  HPI Slipped on back and hip in [employer] in 1976.  Has had hip x-rays 1 year ago showing mild arthritis. Also has had back x-rays last month showing mild arthritis. Has complaints of shoulder pain as well. X-rays performed one year ago which were normal on the right side.  Past medical history significant for chronic pancreatitis associated with alcohol abuse  Pain Inventory Average Pain 10 Pain Right Now 10 My pain is constant and aching  In the last 24 hours, has pain interfered with the following? General activity 10 Relation with others 10 Enjoyment of life 10 What TIME of day is your pain at its worst? morning Sleep (in general) Poor  Pain is worse with: walking, bending, some activites and nerves Pain improves with: heat/ice and medication Relief from Meds: 10  Mobility use a cane ability to climb steps?  yes do you drive?  yes  Function not employed: date last employed SSI I need assistance with the following:  meal prep, household duties and shopping  Neuro/Psych weakness numbness trouble walking spasms confusion depression anxiety  Prior Studies x-rays  Physicians involved in your care Primary care Osei Bonsu   History reviewed. No pertinent family history. History   Social History  . Marital Status: Single    Spouse Name: N/A    Number of Children: N/A  . Years of Education: N/A   Social History Main Topics  . Smoking status: Current Every Day Smoker -- 0.50 packs/day for 30 years    Types: Cigarettes  . Smokeless tobacco: Never Used  . Alcohol Use: Yes     Comment: daily  . Drug Use: No  . Sexually Active: None   Other Topics Concern  . None   Social History Narrative  . None   Past Surgical History  Procedure Laterality Date  . Abdominal surgery     Past Medical History  Diagnosis Date  . Chronic pain   . Arthritis   . Hypertension    . Chronic pain   . Diabetes mellitus without complication     borderline  . Hyperlipidemia    BP 141/92  Pulse 100  Resp 14  Ht 5\' 11"  (1.803 m)  Wt 149 lb (67.586 kg)  BMI 20.79 kg/m2  SpO2 %    Review of Systems  Musculoskeletal: Positive for back pain and arthralgias.       Right Shoulder, left hip pain , spasms  Neurological: Positive for weakness and numbness.  Psychiatric/Behavioral: Positive for confusion and dysphoric mood. The patient is nervous/anxious.   All other systems reviewed and are negative.       Objective:   Physical Exam  Nursing note and vitals reviewed. Constitutional: He is oriented to person, place, and time. He appears well-developed and well-nourished.  HENT:  Head: Normocephalic and atraumatic.  Eyes: Conjunctivae and EOM are normal. Pupils are equal, round, and reactive to light.  Musculoskeletal:       Right shoulder: He exhibits normal range of motion and no tenderness.       Left shoulder: He exhibits no deformity and no laceration.       Right hip: Normal.       Left hip: He exhibits decreased range of motion. He exhibits no deformity.       Cervical back: He exhibits decreased range of motion. He exhibits no tenderness, no deformity and  no spasm.       Lumbar back: He exhibits decreased range of motion, tenderness and spasm. He exhibits no deformity.  Mild pain with shoulder internal extra rotation negative impingement signs, negative drop arm test  Neurological: He is alert and oriented to person, place, and time. He has normal strength.  Psychiatric: He has a normal mood and affect. His speech is delayed. He is slowed and withdrawn.  Decreased eye contact   No atrophy along the shoulder girdle       Assessment & Plan:  1. Diffuse nonspecific pain. He does have reduced range of motion at the hip. No recent trauma reported. X-rays 1 year ago showed only mild arthritis. Will repeat x-rays of the Left hip to reassess any changes in  last one year.  Based on no significant findings on x-rays would not treat with narcotics. Also with history of alcohol abuse this is not a good idea. Can use medicines such as tramadol. May benefit from physical therapy.  He specifically asks for "stronger pain medicines",  explained that we must find a serious problem to treat as well as perform urine drug screen. We are in the process of workup at this point.  2. Shoulder pain nonspecific right shoulder x-ray negative. May have some mild rotator cuff disease. Certainly no sign of rotator cuff tear at least a full thickness.  3. 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

## 2012-10-31 ENCOUNTER — Telehealth: Payer: Self-pay

## 2012-10-31 NOTE — Telephone Encounter (Signed)
Patient called confused about pain management.

## 2012-11-01 NOTE — Telephone Encounter (Signed)
Just wanted to know when he is going to get started on the pain management program.  I explained at this point we are not prescribing narcotics and he is currently on tramadol but he would need to discuss further treatments at his follow up appt 11/15/12 at 9:00.

## 2012-11-04 ENCOUNTER — Telehealth: Payer: Self-pay

## 2012-11-04 NOTE — Telephone Encounter (Signed)
Message copied by Judd Gaudier on Mon Nov 04, 2012  3:07 PM ------      Message from: Su Monks      Created: Mon Nov 04, 2012  9:02 AM       Please educate patient, if this happens again d/c ------

## 2012-11-07 ENCOUNTER — Ambulatory Visit (HOSPITAL_COMMUNITY)
Admission: RE | Admit: 2012-11-07 | Discharge: 2012-11-07 | Disposition: A | Discharge status: Home / Self Care | Payer: Medicaid Other | Source: Ambulatory Visit | Attending: Physical Medicine & Rehabilitation | Admitting: Physical Medicine & Rehabilitation

## 2012-11-07 ENCOUNTER — Encounter (HOSPITAL_COMMUNITY): Payer: Self-pay | Admitting: Emergency Medicine

## 2012-11-07 ENCOUNTER — Emergency Department (HOSPITAL_COMMUNITY): Payer: Medicaid Other

## 2012-11-07 ENCOUNTER — Emergency Department (HOSPITAL_COMMUNITY)
Admission: EM | Admit: 2012-11-07 | Discharge: 2012-11-07 | Disposition: A | Discharge status: Home / Self Care | Payer: Medicaid Other | Attending: Emergency Medicine | Admitting: Emergency Medicine

## 2012-11-07 DIAGNOSIS — IMO0002 Reserved for concepts with insufficient information to code with codable children: Secondary | ICD-10-CM | POA: Insufficient documentation

## 2012-11-07 DIAGNOSIS — S79929A Unspecified injury of unspecified thigh, initial encounter: Secondary | ICD-10-CM | POA: Insufficient documentation

## 2012-11-07 DIAGNOSIS — S0993XA Unspecified injury of face, initial encounter: Secondary | ICD-10-CM | POA: Insufficient documentation

## 2012-11-07 DIAGNOSIS — Z8739 Personal history of other diseases of the musculoskeletal system and connective tissue: Secondary | ICD-10-CM | POA: Insufficient documentation

## 2012-11-07 DIAGNOSIS — G8929 Other chronic pain: Secondary | ICD-10-CM | POA: Insufficient documentation

## 2012-11-07 DIAGNOSIS — S39848A Other specified injuries of external genitals, initial encounter: Secondary | ICD-10-CM | POA: Insufficient documentation

## 2012-11-07 DIAGNOSIS — F17200 Nicotine dependence, unspecified, uncomplicated: Secondary | ICD-10-CM | POA: Insufficient documentation

## 2012-11-07 DIAGNOSIS — M542 Cervicalgia: Secondary | ICD-10-CM

## 2012-11-07 DIAGNOSIS — Z79899 Other long term (current) drug therapy: Secondary | ICD-10-CM | POA: Insufficient documentation

## 2012-11-07 DIAGNOSIS — M25859 Other specified joint disorders, unspecified hip: Secondary | ICD-10-CM | POA: Insufficient documentation

## 2012-11-07 DIAGNOSIS — Y929 Unspecified place or not applicable: Secondary | ICD-10-CM | POA: Insufficient documentation

## 2012-11-07 DIAGNOSIS — M24552 Contracture, left hip: Secondary | ICD-10-CM

## 2012-11-07 DIAGNOSIS — M25559 Pain in unspecified hip: Secondary | ICD-10-CM | POA: Insufficient documentation

## 2012-11-07 DIAGNOSIS — E785 Hyperlipidemia, unspecified: Secondary | ICD-10-CM | POA: Insufficient documentation

## 2012-11-07 DIAGNOSIS — S199XXA Unspecified injury of neck, initial encounter: Secondary | ICD-10-CM | POA: Insufficient documentation

## 2012-11-07 DIAGNOSIS — I10 Essential (primary) hypertension: Secondary | ICD-10-CM | POA: Insufficient documentation

## 2012-11-07 DIAGNOSIS — S79919A Unspecified injury of unspecified hip, initial encounter: Secondary | ICD-10-CM | POA: Insufficient documentation

## 2012-11-07 DIAGNOSIS — M76899 Other specified enthesopathies of unspecified lower limb, excluding foot: Secondary | ICD-10-CM | POA: Insufficient documentation

## 2012-11-07 DIAGNOSIS — S3994XA Unspecified injury of external genitals, initial encounter: Secondary | ICD-10-CM | POA: Insufficient documentation

## 2012-11-07 DIAGNOSIS — Y939 Activity, unspecified: Secondary | ICD-10-CM | POA: Insufficient documentation

## 2012-11-07 MED ORDER — TRAMADOL HCL 50 MG PO TABS: 50.0000 mg | ORAL_TABLET | Freq: Four times a day (QID) | ORAL | Status: DC | PRN

## 2012-11-07 MED ORDER — KETOROLAC TROMETHAMINE 60 MG/2ML IM SOLN
60.0000 mg | Freq: Once | INTRAMUSCULAR | Status: AC
  Administered 2012-11-07: 60 mg via INTRAMUSCULAR
  Filled 2012-11-07: qty 2

## 2012-11-07 MED ORDER — DIAZEPAM 5 MG PO TABS
5.0000 mg | ORAL_TABLET | Freq: Once | ORAL | Status: AC
  Administered 2012-11-07: 5 mg via ORAL
  Filled 2012-11-07: qty 1

## 2012-11-07 NOTE — ED Notes (Signed)
Pt reports recurrent hip pain x 4 weeks. Lower l/back 20 years. C/o testicle pain -recurrent

## 2012-11-07 NOTE — ED Provider Notes (Signed)
History     CSN: [id number]  Arrival date & time 11/07/12  1109   First MD Initiated Contact with Patient 11/07/12 1122      Chief Complaint  Patient presents with  . Back Pain       . Hip Pain    hx of l/hip pain x 4 weeks  . Testicle Pain  . Neck Pain    pain when he turns neck x 4 weeks    (Consider location/radiation/quality/duration/timing/severity/associated sxs/prior treatment) HPI Comments: Patient is a 57 year old male who presents with neck pain for the past 4 weeks. Symptoms started suddenly after patient bumped his head on the washing machine. The pain is aching and severe without radiation. Patient has not tried anything for pain. Neck movement makes the pain worse. Nothing makes the pain better.   Patient is a 57 y.o. male presenting with back pain, hip pain, testicular pain, and neck pain.  Back Pain Hip Pain Associated symptoms include neck pain.  Testicle Pain Associated symptoms include neck pain.  Neck Pain   Past Medical History  Diagnosis Date  . Chronic pain   . Arthritis   . Hypertension   . Chronic pain   . Diabetes mellitus without complication     borderline  . Hyperlipidemia     Past Surgical History  Procedure Laterality Date  . Abdominal surgery    . Hemorroidectomy      Family History  Problem Relation Age of Onset  . Hypertension Mother   . Migraines Sister   . Heart failure Brother   . Migraines Brother     History  Substance Use Topics  . Smoking status: Current Every Day Smoker -- 0.50 packs/day for 30 years    Types: Cigarettes  . Smokeless tobacco: Never Used  . Alcohol Use: Yes     Comment: daily      Review of Systems  HENT: Positive for neck pain.   Genitourinary: Positive for testicular pain.  Musculoskeletal: Positive for back pain.  All other systems reviewed and are negative.    Allergies  Tylenol  Home Medications   Current Outpatient Rx  Name  Route  Sig  Dispense  Refill  . acetaminophen  (TYLENOL) 500 MG tablet   Oral   Take 500 mg by mouth every 6 (six) hours as needed for pain.         . ciprofloxacin (CIPRO) 500 MG tablet   Oral   Take 500 mg by mouth 2 (two) times daily.         . folic acid (FOLVITE) 1 MG tablet   Oral   Take 1 tablet (1 mg total) by mouth daily.   30 tablet   0   . gabapentin (NEURONTIN) 300 MG capsule   Oral   Take 1 capsule (300 mg total) by mouth 3 (three) times daily.   45 capsule   0   . HYDROcodone-acetaminophen (NORCO/VICODIN) 5-325 MG per tablet   Oral   Take 2 tablets by mouth every 4 (four) hours as needed for pain.   30 tablet   0   . magnesium oxide (MAG-OX) 400 MG tablet   Oral   Take 400 mg by mouth daily.         . metoprolol tartrate (LOPRESSOR) 12.5 mg TABS   Oral   Take 0.5 tablets (12.5 mg total) by mouth 2 (two) times daily.   60 tablet   0   . omeprazole (PRILOSEC) 20  MG capsule   Oral   Take 20 mg by mouth daily.         . simvastatin (ZOCOR) 20 MG tablet   Oral   Take 0.5 tablets (10 mg total) by mouth at bedtime.   30 tablet   0   . traMADol (ULTRAM) 50 MG tablet   Oral   Take 1 tablet (50 mg total) by mouth every 8 (eight) hours as needed for pain.   90 tablet   1     BP 116/71  Pulse 97  Temp(Src) 99.2 F (37.3 C) (Oral)  Resp 20  Wt 149 lb (67.586 kg)  BMI 20.79 kg/m2  SpO2 99%  Physical Exam  Nursing note and vitals reviewed. Constitutional: He appears well-developed and well-nourished. No distress.  HENT:  Head: Normocephalic and atraumatic.  Eyes: Conjunctivae are normal.  Neck:  ROM limited due to pain.   Cardiovascular: Normal rate and regular rhythm.  Exam reveals no gallop and no friction rub.   No murmur heard. Pulmonary/Chest: Effort normal and breath sounds normal. He has no wheezes. He has no rales. He exhibits no tenderness.  Abdominal: Soft. There is no tenderness.  Musculoskeletal: Normal range of motion.  Cervical spine tenderness to palpation.    Neurological: He is alert.  Speech is goal-oriented. Moves limbs without ataxia.   Skin: Skin is warm and dry.  Psychiatric: He has a normal mood and affect. His behavior is normal.    ED Course  Procedures (including critical care time)  Labs Reviewed - No data to display Dg Hip Complete Left  11/07/2012   *RADIOLOGY REPORT*  Clinical Data: Worsening hip pain over years, remote fall from a truck in the 1980s injuring hip  LEFT HIP - COMPLETE 2+ VIEW  Comparison: None  Findings: Osseous mineralization grossly normal for technique. Symmetric preserved SI joints. Bilateral hip joint space narrowing and femoral head spur formation compatible with osteoarthritic changes, greater on left. No acute fracture, dislocation, or bone destruction. Soft tissues unremarkable.  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   Original Report Authenticated By: Ulyses Southward, M.D.     1. Neck pain       MDM  1:12 PM Xray pending. Patient give IM toradol and PO valium.  1:44 PM Xray unremarkable for acute changes. Patient has no neuro deficits. I will discharge the patient with Tramadol for pain. Vitals stable and patient afebrile.       Emilia Beck, PA-C 11/07/12 1348

## 2012-11-08 NOTE — ED Provider Notes (Signed)
Medical screening examination/treatment/procedure(s) were performed by non-physician practitioner and as supervising physician I was immediately available for consultation/collaboration.  Derwood Kaplan, MD 11/08/12 1452

## 2012-11-13 ENCOUNTER — Telehealth: Payer: Self-pay

## 2012-11-13 NOTE — Telephone Encounter (Signed)
Patient called requesting pain medication.  Advised him he was a non-narcotic management patient.  He will discuss this further at his next appointment.

## 2012-11-15 ENCOUNTER — Ambulatory Visit: Payer: Medicaid Other | Admitting: Physical Medicine & Rehabilitation

## 2013-01-03 ENCOUNTER — Encounter: Payer: Medicaid Other | Attending: Physical Medicine & Rehabilitation

## 2013-01-03 ENCOUNTER — Ambulatory Visit: Payer: Medicaid Other | Admitting: Physical Medicine & Rehabilitation

## 2013-01-03 DIAGNOSIS — M25519 Pain in unspecified shoulder: Secondary | ICD-10-CM | POA: Insufficient documentation

## 2013-01-03 DIAGNOSIS — M1610 Unilateral primary osteoarthritis, unspecified hip: Secondary | ICD-10-CM | POA: Insufficient documentation

## 2013-01-03 DIAGNOSIS — R209 Unspecified disturbances of skin sensation: Secondary | ICD-10-CM | POA: Insufficient documentation

## 2013-01-03 DIAGNOSIS — G8929 Other chronic pain: Secondary | ICD-10-CM | POA: Insufficient documentation

## 2013-01-03 DIAGNOSIS — M549 Dorsalgia, unspecified: Secondary | ICD-10-CM | POA: Insufficient documentation

## 2013-06-03 ENCOUNTER — Emergency Department (HOSPITAL_COMMUNITY)
Admission: EM | Admit: 2013-06-03 | Discharge: 2013-06-04 | Disposition: A | Discharge status: Home / Self Care | Payer: Medicaid Other | Attending: Dermatology | Admitting: Dermatology

## 2013-06-03 ENCOUNTER — Encounter (HOSPITAL_COMMUNITY): Payer: Self-pay | Admitting: Emergency Medicine

## 2013-06-03 DIAGNOSIS — G8929 Other chronic pain: Secondary | ICD-10-CM | POA: Insufficient documentation

## 2013-06-03 DIAGNOSIS — M129 Arthropathy, unspecified: Secondary | ICD-10-CM | POA: Insufficient documentation

## 2013-06-03 DIAGNOSIS — F17200 Nicotine dependence, unspecified, uncomplicated: Secondary | ICD-10-CM | POA: Insufficient documentation

## 2013-06-03 DIAGNOSIS — Z79899 Other long term (current) drug therapy: Secondary | ICD-10-CM | POA: Insufficient documentation

## 2013-06-03 DIAGNOSIS — F1010 Alcohol abuse, uncomplicated: Secondary | ICD-10-CM | POA: Insufficient documentation

## 2013-06-03 DIAGNOSIS — I10 Essential (primary) hypertension: Secondary | ICD-10-CM | POA: Insufficient documentation

## 2013-06-03 DIAGNOSIS — F10929 Alcohol use, unspecified with intoxication, unspecified: Secondary | ICD-10-CM

## 2013-06-03 DIAGNOSIS — E785 Hyperlipidemia, unspecified: Secondary | ICD-10-CM | POA: Insufficient documentation

## 2013-06-03 LAB — COMPREHENSIVE METABOLIC PANEL
ALBUMIN: 3.1 g/dL — AB (ref 3.5–5.2)
ALT: 26 U/L (ref 0–53)
AST: 80 U/L — ABNORMAL HIGH (ref 0–37)
Alkaline Phosphatase: 126 U/L — ABNORMAL HIGH (ref 39–117)
BUN: 6 mg/dL (ref 6–23)
CALCIUM: 8 mg/dL — AB (ref 8.4–10.5)
CO2: 31 mEq/L (ref 19–32)
CREATININE: 0.88 mg/dL (ref 0.50–1.35)
Chloride: 100 mEq/L (ref 96–112)
GFR calc Af Amer: >90 mL/min (ref >90)
GFR calc non Af Amer: >90 mL/min (ref >90)
Glucose, Bld: 91 mg/dL (ref 70–99)
Potassium: 3.2 mEq/L — ABNORMAL LOW (ref 3.7–5.3)
Sodium: 142 mEq/L (ref 137–147)
TOTAL PROTEIN: 7 g/dL (ref 6.0–8.3)
Total Bilirubin: 0.3 mg/dL (ref 0.3–1.2)

## 2013-06-03 LAB — CBC
HEMATOCRIT: 32.4 % — AB (ref 39.0–52.0)
Hemoglobin: 11.8 g/dL — ABNORMAL LOW (ref 13.0–17.0)
MCH: 36 pg — AB (ref 26.0–34.0)
MCHC: 36.4 g/dL — AB (ref 30.0–36.0)
MCV: 98.8 fL (ref 78.0–100.0)
Platelets: 123 10*3/uL — ABNORMAL LOW (ref 150–400)
RBC: 3.28 MIL/uL — ABNORMAL LOW (ref 4.22–5.81)
RDW: 13.5 % (ref 11.5–15.5)
WBC: 2.8 10*3/uL — ABNORMAL LOW (ref 4.0–10.5)

## 2013-06-03 LAB — ETHANOL: Alcohol, Ethyl (B): 271 mg/dL — ABNORMAL HIGH (ref 0–11)

## 2013-06-03 MED ORDER — VITAMIN B-1 100 MG PO TABS: 100.0000 mg | ORAL_TABLET | Freq: Every day | ORAL | Status: DC

## 2013-06-03 MED ORDER — LORAZEPAM 1 MG PO TABS
1.0000 mg | ORAL_TABLET | Freq: Four times a day (QID) | ORAL | Status: DC | PRN
Start: 2013-06-03 — End: 2013-06-04

## 2013-06-03 MED ORDER — FOLIC ACID 1 MG PO TABS: 1.0000 mg | ORAL_TABLET | Freq: Every day | ORAL | Status: DC

## 2013-06-03 MED ORDER — ADULT MULTIVITAMIN W/MINERALS CH: 1.0000 | ORAL_TABLET | Freq: Every day | ORAL | Status: DC

## 2013-06-03 MED ORDER — LORAZEPAM 2 MG/ML IJ SOLN: 1.0000 mg | Freq: Four times a day (QID) | INTRAMUSCULAR | Status: DC | PRN

## 2013-06-03 MED ORDER — THIAMINE HCL 100 MG/ML IJ SOLN: 100.0000 mg | Freq: Every day | INTRAMUSCULAR | Status: DC

## 2013-06-03 NOTE — ED Notes (Signed)
Bed: WA03 Expected date:  Expected time:  Means of arrival:  Comments: EMS 

## 2013-06-03 NOTE — ED Notes (Signed)
EMS was called by occupants of the address to pick the patient up because he had been drinking all day. No reports of fall, injury or any other illness.

## 2013-06-03 NOTE — ED Notes (Signed)
Pt unalbel to void, states just went to bathroom.

## 2013-06-03 NOTE — ED Provider Notes (Signed)
CSN: [id number]     Arrival date & time 06/03/13  1850 History   First MD Initiated Contact with Patient 06/03/13 1915     Chief Complaint  Patient presents with  . Alcohol Intoxication   (Consider location/radiation/quality/duration/timing/severity/associated sxs/prior Treatment) Patient is a 58 y.o. male presenting with intoxication. The history is provided by the patient and the EMS personnel. The history is limited by the condition of the patient.  Alcohol Intoxication  pt with hx etoh abuse.  Other occupants of pts residence indicate he had been drinking all day today. Pt intoxicated, not responsive to questioning - level 5 caveat. No report of recent trauma or injury.      Past Medical History  Diagnosis Date  . Chronic pain   . Arthritis   . Hypertension   . Chronic pain   . Diabetes mellitus without complication     borderline  . Hyperlipidemia    Past Surgical History  Procedure Laterality Date  . Abdominal surgery    . Hemorroidectomy     Family History  Problem Relation Age of Onset  . Hypertension Mother   . Migraines Sister   . Heart failure Brother   . Migraines Brother    History  Substance Use Topics  . Smoking status: Current Every Day Smoker -- 0.50 packs/day for 30 years    Types: Cigarettes  . Smokeless tobacco: Never Used  . Alcohol Use: Yes     Comment: daily    Review of Systems  Unable to perform ROS: Mental status change  pt unresponsive/intoxicated - level 5 caveat.    Allergies  Tylenol  Home Medications   Current Outpatient Rx  Name  Route  Sig  Dispense  Refill  . acetaminophen (TYLENOL) 500 MG tablet   Oral   Take 500 mg by mouth every 6 (six) hours as needed for pain.         . ciprofloxacin (CIPRO) 500 MG tablet   Oral   Take 500 mg by mouth 2 (two) times daily.         . folic acid (FOLVITE) 1 MG tablet   Oral   Take 1 tablet (1 mg total) by mouth daily.   30 tablet   0   . gabapentin (NEURONTIN) 300 MG  capsule   Oral   Take 1 capsule (300 mg total) by mouth 3 (three) times daily.   45 capsule   0   . HYDROcodone-acetaminophen (NORCO/VICODIN) 5-325 MG per tablet   Oral   Take 2 tablets by mouth every 4 (four) hours as needed for pain.   30 tablet   0   . magnesium oxide (MAG-OX) 400 MG tablet   Oral   Take 400 mg by mouth daily.         . metoprolol tartrate (LOPRESSOR) 12.5 mg TABS   Oral   Take 0.5 tablets (12.5 mg total) by mouth 2 (two) times daily.   60 tablet   0   . omeprazole (PRILOSEC) 20 MG capsule   Oral   Take 20 mg by mouth daily.         . simvastatin (ZOCOR) 20 MG tablet   Oral   Take 0.5 tablets (10 mg total) by mouth at bedtime.   30 tablet   0   . traMADol (ULTRAM) 50 MG tablet   Oral   Take 1 tablet (50 mg total) by mouth every 8 (eight) hours as needed for pain.   90 tablet  1   . traMADol (ULTRAM) 50 MG tablet   Oral   Take 1 tablet (50 mg total) by mouth every 6 (six) hours as needed for pain.   15 tablet   0    BP 111/71  Pulse 75  Resp 16  SpO2 95% Physical Exam  Nursing note and vitals reviewed. Constitutional: He appears well-developed and well-nourished. No distress.  HENT:  Head: Atraumatic.  Mouth/Throat: Oropharynx is clear and moist.  Eyes: Conjunctivae are normal. Pupils are equal, round, and reactive to light. No scleral icterus.  Neck: Neck supple. No tracheal deviation present.  Cardiovascular: Normal rate, regular rhythm, normal heart sounds and intact distal pulses.   Pulmonary/Chest: Effort normal and breath sounds normal. No accessory muscle usage. No respiratory distress. He exhibits no tenderness.  Abdominal: Soft. Bowel sounds are normal. He exhibits no distension. There is no tenderness.  Musculoskeletal: Normal range of motion. He exhibits no edema.  Neurological:  Pt intoxicated. Resting.  arousable to stimuli, moves bil extremities purposefully, but doesn't follow commands.   Skin: Skin is warm and  dry. He is not diaphoretic.  Psychiatric:  Pt intoxicated, uncooperative.     ED Course  Procedures (including critical care time)   Results for orders placed during the hospital encounter of 06/03/13  CBC      Result Value Range   WBC 2.8 (*) 4.0 - 10.5 K/uL   RBC 3.28 (*) 4.22 - 5.81 MIL/uL   Hemoglobin 11.8 (*) 13.0 - 17.0 g/dL   HCT 16.1 (*) 09.6 - 04.5 %   MCV 98.8  78.0 - 100.0 fL   MCH 36.0 (*) 26.0 - 34.0 pg   MCHC 36.4 (*) 30.0 - 36.0 g/dL   RDW 40.9  81.1 - 91.4 %   Platelets 123 (*) 150 - 400 K/uL  COMPREHENSIVE METABOLIC PANEL      Result Value Range   Sodium 142  137 - 147 mEq/L   Potassium 3.2 (*) 3.7 - 5.3 mEq/L   Chloride 100  96 - 112 mEq/L   CO2 31  19 - 32 mEq/L   Glucose, Bld 91  70 - 99 mg/dL   BUN 6  6 - 23 mg/dL   Creatinine, Ser 7.82  0.50 - 1.35 mg/dL   Calcium 8.0 (*) 8.4 - 10.5 mg/dL   Total Protein 7.0  6.0 - 8.3 g/dL   Albumin 3.1 (*) 3.5 - 5.2 g/dL   AST 80 (*) 0 - 37 U/L   ALT 26  0 - 53 U/L   Alkaline Phosphatase 126 (*) 39 - 117 U/L   Total Bilirubin 0.3  0.3 - 1.2 mg/dL   GFR calc non Af Amer >90  >90 mL/min   GFR calc Af Amer >90  >90 mL/min  ETHANOL      Result Value Range   Alcohol, Ethyl (B) 271 (*) 0 - 11 mg/dL      EKG Interpretation   None       MDM  Labs.   Psych team consulted.  Reviewed nursing notes and prior charts for additional history.   Recheck pt resting, easily aroused. No distress. Awaiting psych team eval.   Signed out to oncoming provider to follow up with psych team assessment, recheck pt, and dispo appropriately.    Suzi Roots, MD 06/04/13 [phone]

## 2013-06-04 NOTE — ED Notes (Signed)
Patient was able to get up from the stretcher and ambulate independently without assistance to and from the restroom.

## 2013-06-04 NOTE — [facility] Assessment (Signed)
[facility]   Clinician talked with Dr. Denton LankSteinl.  Patient was brought in by EMS after a friend had called them because patient had been drinking all day.  Dr. Denton LankSteinl had said that patient may only need outpatient referrals.  Pt may not need full assessment he said.  Clinician attempted to assess patient but he was too sleepy to assess at this time.  Clinician let on-coming [facility] clinician know that patient still needs to be assessed when he is more alert & oriented.

## 2013-06-04 NOTE — ED Provider Notes (Signed)
2:50 AM TTS previously he tried to evaluate patient but he was still intoxicated and would not wake up to talk with anyone. At this time patient is awake, ambulates to the bathroom and his drinking water without distress. He states he drank too much and is upset that he was brought to the emergency department he denies any suicidal or homicidal ideations. He denies any drug use. He states he has a doctor's office at 9 AM and would like to be discharged home. No indication for IVC. PT does not require psychiatric evaluation at this time.  Sunnie NielsenBrian Jashley Yellin, MD 06/04/13 [phone]

## 2013-06-04 NOTE — Discharge Instructions (Signed)
Alcohol Intoxication °Alcohol intoxication occurs when the amount of alcohol that a person has consumed impairs his or her ability to mentally and physically function. Alcohol directly impairs the normal chemical activity of the brain. Drinking large amounts of alcohol can lead to changes in mental function and behavior, and it can cause many physical effects that can be harmful.  °Alcohol intoxication can range in severity from mild to very severe. Various factors can affect the level of intoxication that occurs, such as the person's age, gender, weight, frequency of alcohol consumption, and the presence of other medical conditions (such as diabetes, seizures, or heart conditions). Dangerous levels of alcohol intoxication may occur when people drink large amounts of alcohol in a short period (binge drinking). Alcohol can also be especially dangerous when combined with certain prescription medicines or "recreational" drugs. °SIGNS AND SYMPTOMS °Some common signs and symptoms of mild alcohol intoxication include: °· Loss of coordination. °· Changes in mood and behavior. °· Impaired judgment. °· Slurred speech. °As alcohol intoxication progresses to more severe levels, other signs and symptoms will appear. These may include: °· Vomiting. °· Confusion and impaired memory. °· Slowed breathing. °· Seizures. °· Loss of consciousness. °DIAGNOSIS  °Your health care provider will take a medical history and perform a physical exam. You will be asked about the amount and type of alcohol you have consumed. Blood tests will be done to measure the concentration of alcohol in your blood. In many places, your blood alcohol level must be lower than 80 mg/dL (0.08%) to legally drive. However, many dangerous effects of alcohol can occur at much lower levels.  °TREATMENT  °People with alcohol intoxication often do not require treatment. Most of the effects of alcohol intoxication are temporary, and they go away as the alcohol naturally  leaves the body. Your health care provider will monitor your condition until you are stable enough to go home. Fluids are sometimes given through an IV access tube to help prevent dehydration.  °HOME CARE INSTRUCTIONS °· Do not drive after drinking alcohol. °· Stay hydrated. Drink enough water and fluids to keep your urine clear or pale yellow. Avoid caffeine.   °· Only take over-the-counter or prescription medicines as directed by your health care provider.   °SEEK MEDICAL CARE IF:  °· You have persistent vomiting.   °· You do not feel better after a few days. °· You have frequent alcohol intoxication. Your health care provider can help determine if you should see a substance use treatment counselor. °SEEK IMMEDIATE MEDICAL CARE IF:  °· You become shaky or tremble when you try to stop drinking.   °· You shake uncontrollably (seizure).   °· You throw up (vomit) blood. This may be bright red or may look like black coffee grounds.   °· You have blood in your stool. This may be bright red or may appear as a black, tarry, bad smelling stool.   °· You become lightheaded or faint.   °MAKE SURE YOU:  °· Understand these instructions. °· Will watch your condition. °· Will get help right away if you are not doing well or get worse. °Document Released: 02/15/2005 Document Revised: 01/08/2013 Document Reviewed: 10/11/2012 °[employer]® Patient Information ©2014 [employer]. ° °

## 2013-06-04 NOTE — Progress Notes (Signed)
Patient is easily aroused but slightly agitated when awakened. Urine not obtained. Patient denies need to void.

## 2013-11-15 ENCOUNTER — Encounter (HOSPITAL_COMMUNITY): Payer: Self-pay | Admitting: Emergency Medicine

## 2013-11-15 ENCOUNTER — Emergency Department (HOSPITAL_COMMUNITY)
Admission: EM | Admit: 2013-11-15 | Discharge: 2013-11-15 | Disposition: A | Discharge status: Home / Self Care | Payer: Medicaid Other | Attending: Emergency Medicine | Admitting: Emergency Medicine

## 2013-11-15 DIAGNOSIS — G8929 Other chronic pain: Secondary | ICD-10-CM | POA: Insufficient documentation

## 2013-11-15 DIAGNOSIS — I10 Essential (primary) hypertension: Secondary | ICD-10-CM | POA: Diagnosis not present

## 2013-11-15 DIAGNOSIS — F1210 Cannabis abuse, uncomplicated: Secondary | ICD-10-CM | POA: Insufficient documentation

## 2013-11-15 DIAGNOSIS — F17200 Nicotine dependence, unspecified, uncomplicated: Secondary | ICD-10-CM | POA: Insufficient documentation

## 2013-11-15 DIAGNOSIS — Z8639 Personal history of other endocrine, nutritional and metabolic disease: Secondary | ICD-10-CM | POA: Insufficient documentation

## 2013-11-15 DIAGNOSIS — Z862 Personal history of diseases of the blood and blood-forming organs and certain disorders involving the immune mechanism: Secondary | ICD-10-CM | POA: Diagnosis not present

## 2013-11-15 DIAGNOSIS — H5316 Psychophysical visual disturbances: Secondary | ICD-10-CM | POA: Diagnosis not present

## 2013-11-15 DIAGNOSIS — Z79899 Other long term (current) drug therapy: Secondary | ICD-10-CM | POA: Diagnosis not present

## 2013-11-15 DIAGNOSIS — F1910 Other psychoactive substance abuse, uncomplicated: Secondary | ICD-10-CM

## 2013-11-15 MED ORDER — PHENYTOIN SODIUM EXTENDED 100 MG PO CAPS: 300.0000 mg | ORAL_CAPSULE | Freq: Once | ORAL | Status: DC

## 2013-11-15 MED ORDER — PHENYTOIN SODIUM EXTENDED 100 MG PO CAPS
300.0000 mg | ORAL_CAPSULE | Freq: Once | ORAL | Status: DC
Start: 2013-11-15 — End: 2013-11-15

## 2013-11-15 NOTE — ED Notes (Signed)
Per EMS pt was picked up from his residence   Pt and his girlfriend went out with his significant other and he took 2 hits off of a joint and after that about 30 minutes later he started having hallucinations   Pt states he has been having hallucinations since then  Pt has periods of diaphoresis   Pt states they told him it was laced with something but unsure what it was

## 2013-11-15 NOTE — ED Provider Notes (Signed)
CSN: [id number]     Arrival date & time 11/15/13  [id number] History   First MD Initiated Contact with Patient 11/15/13 0518     Chief Complaint  Patient presents with  . Drug Problem     (Consider location/radiation/quality/duration/timing/severity/associated sxs/prior Treatment) HPI  Patient is a 58 yo man who comes to the ED after experiencing visual hallucinations after smoking marijuana. Patient says he also felt off balance and fell once. He denies head trauma. Says he landed against left side.  Patient is unable to describe visual hallucinations. He notes that he has a history of seizure disorder and has been out of Phenytoin for a couple of months. He requests a refill of this medication. The patient admits to alcohol intake - about 1/2 pint per day. He says he has not experienced DTs or withdrawal seizures.   Past Medical History  Diagnosis Date  . Chronic pain   . Arthritis   . Hypertension   . Chronic pain   . Diabetes mellitus without complication     borderline  . Hyperlipidemia    Past Surgical History  Procedure Laterality Date  . Abdominal surgery    . Hemorroidectomy    . Appendectomy     Family History  Problem Relation Age of Onset  . Hypertension Mother   . Migraines Sister   . Heart failure Brother   . Migraines Brother    History  Substance Use Topics  . Smoking status: Current Every Day Smoker -- 0.50 packs/day for 30 years    Types: Cigarettes  . Smokeless tobacco: Never Used  . Alcohol Use: Yes     Comment: occ    Review of Systems Ten point review of symptoms performed and is negative with the exception of symptoms noted above.      Allergies  Tylenol  Home Medications   Prior to Admission medications   Medication Sig Start Date End Date Taking? Authorizing Provider  acetaminophen (TYLENOL) 500 MG tablet Take 500 mg by mouth every 6 (six) hours as needed for pain.   Yes Historical Provider, MD  folic acid (FOLVITE) 1 MG tablet Take 1  tablet (1 mg total) by mouth daily. 10/08/12  Yes Penny Piarlando Vega, MD  gabapentin (NEURONTIN) 300 MG capsule Take 1 capsule (300 mg total) by mouth 3 (three) times daily. 07/31/12  Yes [name], PA-C  HYDROcodone-acetaminophen (NORCO/VICODIN) 5-325 MG per tablet Take 1 tablet by mouth every 6 (six) hours as needed for moderate pain.   Yes Historical Provider, MD  magnesium oxide (MAG-OX) 400 MG tablet Take 400 mg by mouth daily.   Yes Historical Provider, MD  metoprolol tartrate (LOPRESSOR) 12.5 mg TABS Take 0.5 tablets (12.5 mg total) by mouth 2 (two) times daily. 10/08/12  Yes Penny Piarlando Vega, MD  omeprazole (PRILOSEC) 20 MG capsule Take 20 mg by mouth daily.   Yes Historical Provider, MD   BP 105/74  Pulse 103  Temp(Src) 98.4 F (36.9 C) (Oral)  Resp 20  SpO2 98% Physical Exam Gen: well developed and well nourished appearing Head: NCAT Eyes: PERL, EOMI Nose: no epistaixis or rhinorrhea Mouth/throat: mucosa is moist and pink Neck: supple, no stridor Lungs: CTA B, no wheezing, rhonchi or rales CV: RRR, no murmur, extremities appear well perfused.  Abd: soft, notender, nondistended Back: no ttp, no cva ttp Skin: warm and dry Ext: normal to inspection, no dependent edema Neuro: CN ii-xii grossly intact, no focal deficits Psyche; normal affect,  calm and cooperative. Normal speech  and normal gait  ED Course  Procedures (including critical care time) Labs Review   MDM   The patient is medically cleared. We have given the patient full dose of Dilantin and will give script. Patient agrees to f/u with [facility] to establish care.    Brandt LoosenJulie Manly, MD 11/15/13 [phone]

## 2013-11-15 NOTE — ED Notes (Signed)
Patient reports he fell to the ground after taking two hits of marajuana.  Hurts on his left side.

## 2013-11-15 NOTE — ED Notes (Signed)
Pt states he smoked marijuana about 11am yesterday morning and about 30 minutes after he started having hallucinations  Pt states he was very shaky  Pt states when they were getting ready to go home he fell  Pt is c/o pain to the left side of his back, ribs, and knee  Pt states he continues to feel shaky but the hallucinations have stopped

## 2014-02-24 ENCOUNTER — Ambulatory Visit: Payer: Medicaid Other | Attending: Specialist

## 2014-03-02 ENCOUNTER — Encounter (HOSPITAL_COMMUNITY): Payer: Self-pay | Admitting: Emergency Medicine

## 2014-03-02 ENCOUNTER — Inpatient Hospital Stay (HOSPITAL_COMMUNITY)
Admission: EM | Admit: 2014-03-02 | Discharge: 2014-03-04 | DRG: 897 | Disposition: A | Discharge status: Home / Self Care | Payer: Medicaid Other | Attending: Internal Medicine | Admitting: Internal Medicine

## 2014-03-02 DIAGNOSIS — E785 Hyperlipidemia, unspecified: Secondary | ICD-10-CM | POA: Diagnosis present

## 2014-03-02 DIAGNOSIS — R7881 Bacteremia: Secondary | ICD-10-CM

## 2014-03-02 DIAGNOSIS — R55 Syncope and collapse: Secondary | ICD-10-CM

## 2014-03-02 DIAGNOSIS — K7030 Alcoholic cirrhosis of liver without ascites: Secondary | ICD-10-CM | POA: Diagnosis present

## 2014-03-02 DIAGNOSIS — Z881 Allergy status to other antibiotic agents status: Secondary | ICD-10-CM

## 2014-03-02 DIAGNOSIS — Z79899 Other long term (current) drug therapy: Secondary | ICD-10-CM

## 2014-03-02 DIAGNOSIS — D72829 Elevated white blood cell count, unspecified: Secondary | ICD-10-CM

## 2014-03-02 DIAGNOSIS — F1010 Alcohol abuse, uncomplicated: Secondary | ICD-10-CM

## 2014-03-02 DIAGNOSIS — K7010 Alcoholic hepatitis without ascites: Secondary | ICD-10-CM

## 2014-03-02 DIAGNOSIS — F17210 Nicotine dependence, cigarettes, uncomplicated: Secondary | ICD-10-CM | POA: Diagnosis present

## 2014-03-02 DIAGNOSIS — K219 Gastro-esophageal reflux disease without esophagitis: Secondary | ICD-10-CM | POA: Diagnosis present

## 2014-03-02 DIAGNOSIS — N179 Acute kidney failure, unspecified: Secondary | ICD-10-CM | POA: Diagnosis present

## 2014-03-02 DIAGNOSIS — E872 Acidosis: Secondary | ICD-10-CM | POA: Diagnosis present

## 2014-03-02 DIAGNOSIS — E119 Type 2 diabetes mellitus without complications: Secondary | ICD-10-CM | POA: Diagnosis present

## 2014-03-02 DIAGNOSIS — R748 Abnormal levels of other serum enzymes: Secondary | ICD-10-CM

## 2014-03-02 DIAGNOSIS — R079 Chest pain, unspecified: Secondary | ICD-10-CM

## 2014-03-02 DIAGNOSIS — F10929 Alcohol use, unspecified with intoxication, unspecified: Secondary | ICD-10-CM | POA: Diagnosis present

## 2014-03-02 DIAGNOSIS — E8729 Other acidosis: Secondary | ICD-10-CM | POA: Diagnosis present

## 2014-03-02 DIAGNOSIS — D72819 Decreased white blood cell count, unspecified: Secondary | ICD-10-CM | POA: Diagnosis present

## 2014-03-02 DIAGNOSIS — K760 Fatty (change of) liver, not elsewhere classified: Secondary | ICD-10-CM | POA: Diagnosis present

## 2014-03-02 DIAGNOSIS — M1990 Unspecified osteoarthritis, unspecified site: Secondary | ICD-10-CM | POA: Diagnosis present

## 2014-03-02 DIAGNOSIS — D696 Thrombocytopenia, unspecified: Secondary | ICD-10-CM | POA: Diagnosis present

## 2014-03-02 DIAGNOSIS — E860 Dehydration: Secondary | ICD-10-CM | POA: Diagnosis present

## 2014-03-02 DIAGNOSIS — K861 Other chronic pancreatitis: Secondary | ICD-10-CM | POA: Diagnosis present

## 2014-03-02 DIAGNOSIS — F1290 Cannabis use, unspecified, uncomplicated: Secondary | ICD-10-CM | POA: Diagnosis present

## 2014-03-02 DIAGNOSIS — E876 Hypokalemia: Secondary | ICD-10-CM

## 2014-03-02 DIAGNOSIS — Z681 Body mass index (BMI) 19 or less, adult: Secondary | ICD-10-CM

## 2014-03-02 DIAGNOSIS — F10129 Alcohol abuse with intoxication, unspecified: Principal | ICD-10-CM | POA: Diagnosis present

## 2014-03-02 DIAGNOSIS — I10 Essential (primary) hypertension: Secondary | ICD-10-CM | POA: Diagnosis present

## 2014-03-02 DIAGNOSIS — I6523 Occlusion and stenosis of bilateral carotid arteries: Secondary | ICD-10-CM | POA: Diagnosis present

## 2014-03-02 DIAGNOSIS — G8929 Other chronic pain: Secondary | ICD-10-CM | POA: Diagnosis present

## 2014-03-02 DIAGNOSIS — R17 Unspecified jaundice: Secondary | ICD-10-CM

## 2014-03-02 DIAGNOSIS — E440 Moderate protein-calorie malnutrition: Secondary | ICD-10-CM | POA: Insufficient documentation

## 2014-03-02 DIAGNOSIS — F10920 Alcohol use, unspecified with intoxication, uncomplicated: Secondary | ICD-10-CM

## 2014-03-02 DIAGNOSIS — K8020 Calculus of gallbladder without cholecystitis without obstruction: Secondary | ICD-10-CM | POA: Diagnosis present

## 2014-03-02 DIAGNOSIS — D61818 Other pancytopenia: Secondary | ICD-10-CM | POA: Diagnosis present

## 2014-03-02 LAB — BASIC METABOLIC PANEL
ANION GAP: 22 — AB (ref 5–15)
BUN: 22 mg/dL (ref 6–23)
CO2: 26 meq/L (ref 19–32)
Calcium: 8.5 mg/dL (ref 8.4–10.5)
Chloride: 96 mEq/L (ref 96–112)
Creatinine, Ser: 2.78 mg/dL — ABNORMAL HIGH (ref 0.50–1.35)
GFR calc Af Amer: 27 mL/min — ABNORMAL LOW (ref >90)
GFR calc non Af Amer: 23 mL/min — ABNORMAL LOW (ref >90)
Glucose, Bld: 87 mg/dL (ref 70–99)
Potassium: 3.7 mEq/L (ref 3.7–5.3)
SODIUM: 144 meq/L (ref 137–147)

## 2014-03-02 LAB — CBC WITH DIFFERENTIAL/PLATELET
BASOS ABS: 0 10*3/uL (ref 0.0–0.1)
Basophils Relative: 0 % (ref 0–1)
Eosinophils Absolute: 0 10*3/uL (ref 0.0–0.7)
Eosinophils Relative: 0 % (ref 0–5)
HEMATOCRIT: 33.7 % — AB (ref 39.0–52.0)
Hemoglobin: 12 g/dL — ABNORMAL LOW (ref 13.0–17.0)
LYMPHS PCT: 50 % — AB (ref 12–46)
Lymphs Abs: 1.8 10*3/uL (ref 0.7–4.0)
MCH: 35.2 pg — ABNORMAL HIGH (ref 26.0–34.0)
MCHC: 35.6 g/dL (ref 30.0–36.0)
MCV: 98.8 fL (ref 78.0–100.0)
Monocytes Absolute: 0.5 10*3/uL (ref 0.1–1.0)
Monocytes Relative: 13 % — ABNORMAL HIGH (ref 3–12)
NEUTROS ABS: 1.3 10*3/uL — AB (ref 1.7–7.7)
Neutrophils Relative %: 37 % — ABNORMAL LOW (ref 43–77)
Platelets: 95 10*3/uL — ABNORMAL LOW (ref 150–400)
RBC: 3.41 MIL/uL — ABNORMAL LOW (ref 4.22–5.81)
RDW: 13.6 % (ref 11.5–15.5)
WBC: 3.6 10*3/uL — AB (ref 4.0–10.5)

## 2014-03-02 NOTE — ED Provider Notes (Signed)
CSN: [id number]     Arrival date & time 03/02/14  1829 History   First MD Initiated Contact with Patient 03/02/14 2339     Chief Complaint  Patient presents with  . Loss of Consciousness     (Consider location/radiation/quality/duration/timing/severity/associated sxs/prior Treatment) The history is provided by the patient. No language interpreter was used.  Dennis Zhang is a 58 y/o M with PMhx of chronic pain, arthritis, HTN, DM, HLD presenting to emergency department after loss of consciousness that occurred today at approximately 6:00 PM this evening. Patient reported that he was standing in front of his door and stated he had sudden onset of chest pain, shortness of breath, dizziness and breathing hard resulting in a syncopal episode with patient collapsed. Patient reported that his neighbor found him and called EMS. Stated that he was out for approximately 7 minutes. Stated that this has never happened to him before. Reported that he was experiencing neck pain, left hip pain, left knee pain and lower back pain. Stated he still experiences chest pain described as a soreness, "like an elephant kicked me" localized to the center of his chest. Denied nausea, vomiting, diarrhea, melena, hematochezia, urinary bowel continence, numbness, tingling, loss of sensation, blurred vision, sudden loss of vision. PCP Dr. Mack Guise  Past Medical History  Diagnosis Date  . Chronic pain   . Arthritis   . Hypertension   . Chronic pain   . Diabetes mellitus without complication     borderline  . Hyperlipidemia    Past Surgical History  Procedure Laterality Date  . Abdominal surgery    . Hemorroidectomy    . Appendectomy     Family History  Problem Relation Age of Onset  . Hypertension Mother   . Migraines Sister   . Heart failure Brother   . Migraines Brother    History  Substance Use Topics  . Smoking status: Current Every Day Smoker -- 0.50 packs/day for 30 years    Types: Cigarettes  .  Smokeless tobacco: Never Used  . Alcohol Use: Yes     Comment: occ    Review of Systems  Constitutional: Negative for fever and chills.  Eyes: Negative for visual disturbance.  Respiratory: Negative for chest tightness and shortness of breath.   Cardiovascular: Positive for chest pain.  Gastrointestinal: Negative for nausea, vomiting and abdominal pain.  Musculoskeletal: Positive for back pain and neck pain.  Neurological: Positive for dizziness and syncope. Negative for weakness, numbness and headaches.      Allergies  Tylenol  Home Medications   Prior to Admission medications   Medication Sig Start Date End Date Taking? Authorizing Provider  acetaminophen (TYLENOL) 500 MG tablet Take 500 mg by mouth every 6 (six) hours as needed for pain.   Yes Historical Provider, MD  cholecalciferol (VITAMIN D) 1000 UNITS tablet Take 1,000 Units by mouth daily.   Yes Historical Provider, MD  folic acid (FOLVITE) 1 MG tablet Take 1 tablet (1 mg total) by mouth daily. 10/08/12  Yes Penny Pia, MD  gabapentin (NEURONTIN) 300 MG capsule Take 1 capsule (300 mg total) by mouth 3 (three) times daily. 07/31/12  Yes [name], PA-C  HYDROcodone-acetaminophen (NORCO) 10-325 MG per tablet Take 1 tablet by mouth every 6 (six) hours as needed for severe pain.   Yes Historical Provider, MD  magnesium oxide (MAG-OX) 400 MG tablet Take 400 mg by mouth daily.   Yes Historical Provider, MD  metoprolol tartrate (LOPRESSOR) 12.5 mg TABS Take 0.5 tablets (  12.5 mg total) by mouth 2 (two) times daily. 10/08/12  Yes Penny Pia, MD  omeprazole (PRILOSEC) 20 MG capsule Take 20 mg by mouth daily.   Yes Historical Provider, MD  phenytoin (DILANTIN) 100 MG ER capsule Take 3 capsules (300 mg total) by mouth once. 11/15/13  Yes Brandt Loosen, MD   BP 159/87  Pulse 86  Temp(Src) 98 F (36.7 C) (Oral)  Resp 15  SpO2 95% Physical Exam  Nursing note and vitals reviewed. Constitutional: He is oriented to person,  place, and time. He appears well-developed and well-nourished. No distress.  Smell of alcohol on breath  HENT:  Head: Normocephalic and atraumatic.  Right Ear: External ear normal.  Left Ear: External ear normal.  Nose: Nose normal.  Mouth/Throat: Oropharynx is clear and moist. No oropharyngeal exudate.  Superficial abrasion identified to just above the left eyebrow Negative palpation of hematomas Negative crepitus or depressions palpated to the skull/maxillofacial region  Negative damage noted to dentition Negative trismus  Eyes: Conjunctivae and EOM are normal. Pupils are equal, round, and reactive to light. Right eye exhibits no discharge. Left eye exhibits no discharge.  Negative nystagmus Negative signs of entrapment  Neck: Normal range of motion. Neck supple. No tracheal deviation present.  Negative neck stiffness Negative nuchal rigidity Negative cervical lymphadenopathy Mild discomfort upon palpation to the C-spine  Cardiovascular: Normal rate, regular rhythm and normal heart sounds.  Exam reveals no friction rub.   No murmur heard. Pulses:      Radial pulses are 2+ on the right side, and 2+ on the left side.       Dorsalis pedis pulses are 2+ on the right side, and 2+ on the left side.  Cap refill < 3 seconds  Pulmonary/Chest: Effort normal and breath sounds normal. No respiratory distress. He has no wheezes. He has no rales. He exhibits no tenderness.  Negative pain upon palpation to the chest wall Negative crepitus palpation to the chest wall Patient is able to speak in full sentences without difficulty Negative use of accessory muscles Negative stridor  Musculoskeletal: Normal range of motion. He exhibits tenderness.       Left hip: He exhibits tenderness. He exhibits normal range of motion, normal strength, no bony tenderness, no swelling and no crepitus.       Left knee: He exhibits normal range of motion, no swelling, no effusion, no ecchymosis, no deformity, no  laceration and no erythema. Tenderness found.       Thoracic back: He exhibits tenderness. He exhibits normal range of motion, no bony tenderness, no swelling, no edema, no deformity and no laceration.       Lumbar back: He exhibits tenderness. He exhibits normal range of motion, no bony tenderness, no swelling, no edema, no deformity and no laceration.       Back:       Legs: Negative deformities identified to the spine-discomfort upon palpation to the midthoracic and lumbar regions as well as paravertebral regions bilaterally-appears to be muscular in nature. Discomfort upon palpation to the left acetabulum region-appears to be muscular nature.  Full ROM to upper and lower extremities without difficulty noted, negative ataxia noted.  Lymphadenopathy:    He has no cervical adenopathy.  Neurological: He is alert and oriented to person, place, and time. No cranial nerve deficit. He exhibits normal muscle tone. Coordination normal. GCS eye subscore is 4. GCS verbal subscore is 5. GCS motor subscore is 6.  Cranial nerves III-XII grossly intact Strength 5+/5+  to upper and lower extremities bilaterally with resistance applied, equal distribution noted Equal grip strength Strength intact to MCP, PIP, DIP joints of bilateral hands Negative saddle paresthesias bilaterally Sensation intact with differentiation sharp and dull touch Negative facial drooping Negative slurred speech Negative aphasia Negative arm drift Fine motor skills intact Patient is able to bring finger to nose bilaterally without difficulty or ataxia Patient is able to respond to questions appropriately Patient follows commands well  Skin: Skin is warm and dry. No rash noted. He is not diaphoretic. No erythema.  Psychiatric: He has a normal mood and affect. His behavior is normal. Thought content normal.    ED Course  Procedures (including critical care time)  2:52 AM This provider spoke with Dr. Izola Price - discussed case,  labs, vitals, imaging in great detail. Patient to be admitted to hospital, admitted to Telemetry.   Results for orders placed during the hospital encounter of 03/02/14  BASIC METABOLIC PANEL      Result Value Ref Range   Sodium 144  137 - 147 mEq/L   Potassium 3.7  3.7 - 5.3 mEq/L   Chloride 96  96 - 112 mEq/L   CO2 26  19 - 32 mEq/L   Glucose, Bld 87  70 - 99 mg/dL   BUN 22  6 - 23 mg/dL   Creatinine, Ser 1.61 (*) 0.50 - 1.35 mg/dL   Calcium 8.5  8.4 - 09.6 mg/dL   GFR calc non Af Amer 23 (*) >90 mL/min   GFR calc Af Amer 27 (*) >90 mL/min   Anion gap 22 (*) 5 - 15  CBC WITH DIFFERENTIAL      Result Value Ref Range   WBC 3.6 (*) 4.0 - 10.5 K/uL   RBC 3.41 (*) 4.22 - 5.81 MIL/uL   Hemoglobin 12.0 (*) 13.0 - 17.0 g/dL   HCT 04.5 (*) 40.9 - 81.1 %   MCV 98.8  78.0 - 100.0 fL   MCH 35.2 (*) 26.0 - 34.0 pg   MCHC 35.6  30.0 - 36.0 g/dL   RDW 91.4  78.2 - 95.6 %   Platelets 95 (*) 150 - 400 K/uL   Neutrophils Relative % 37 (*) 43 - 77 %   Neutro Abs 1.3 (*) 1.7 - 7.7 K/uL   Lymphocytes Relative 50 (*) 12 - 46 %   Lymphs Abs 1.8  0.7 - 4.0 K/uL   Monocytes Relative 13 (*) 3 - 12 %   Monocytes Absolute 0.5  0.1 - 1.0 K/uL   Eosinophils Relative 0  0 - 5 %   Eosinophils Absolute 0.0  0.0 - 0.7 K/uL   Basophils Relative 0  0 - 1 %   Basophils Absolute 0.0  0.0 - 0.1 K/uL  HEPATIC FUNCTION PANEL      Result Value Ref Range   Total Protein 8.3  6.0 - 8.3 g/dL   Albumin 3.8  3.5 - 5.2 g/dL   AST 213 (*) 0 - 37 U/L   ALT 38  0 - 53 U/L   Alkaline Phosphatase 134 (*) 39 - 117 U/L   Total Bilirubin 1.2  0.3 - 1.2 mg/dL   Bilirubin, Direct 0.6 (*) 0.0 - 0.3 mg/dL   Indirect Bilirubin 0.6  0.3 - 0.9 mg/dL  ETHANOL      Result Value Ref Range   Alcohol, Ethyl (B) 232 (*) 0 - 11 mg/dL  TROPONIN I      Result Value Ref Range   Troponin I <  0.30  <0.30 ng/mL  URINALYSIS, ROUTINE W REFLEX MICROSCOPIC      Result Value Ref Range   Color, Urine YELLOW  YELLOW   APPearance CLOUDY (*)  CLEAR   Specific Gravity, Urine 1.014  1.005 - 1.030   pH 5.5  5.0 - 8.0   Glucose, UA NEGATIVE  NEGATIVE mg/dL   Hgb urine dipstick TRACE (*) NEGATIVE   Bilirubin Urine NEGATIVE  NEGATIVE   Ketones, ur NEGATIVE  NEGATIVE mg/dL   Protein, ur 30 (*) NEGATIVE mg/dL   Urobilinogen, UA 1.0  0.0 - 1.0 mg/dL   Nitrite NEGATIVE  NEGATIVE   Leukocytes, UA NEGATIVE  NEGATIVE  URINE RAPID DRUG SCREEN (HOSP PERFORMED)      Result Value Ref Range   Opiates NONE DETECTED  NONE DETECTED   Cocaine NONE DETECTED  NONE DETECTED   Benzodiazepines NONE DETECTED  NONE DETECTED   Amphetamines NONE DETECTED  NONE DETECTED   Tetrahydrocannabinol NONE DETECTED  NONE DETECTED   Barbiturates NONE DETECTED  NONE DETECTED  URINE MICROSCOPIC-ADD ON      Result Value Ref Range   Squamous Epithelial / LPF RARE  RARE   WBC, UA 0-2  <3 WBC/hpf   RBC / HPF 3-6  <3 RBC/hpf   Bacteria, UA RARE  RARE   Labs Review Labs Reviewed  BASIC METABOLIC PANEL - Abnormal; Notable for the following:    Creatinine, Ser 2.78 (*)    GFR calc non Af Amer 23 (*)    GFR calc Af Amer 27 (*)    Anion gap 22 (*)    All other components within normal limits  CBC WITH DIFFERENTIAL - Abnormal; Notable for the following:    WBC 3.6 (*)    RBC 3.41 (*)    Hemoglobin 12.0 (*)    HCT 33.7 (*)    MCH 35.2 (*)    Platelets 95 (*)    Neutrophils Relative % 37 (*)    Neutro Abs 1.3 (*)    Lymphocytes Relative 50 (*)    Monocytes Relative 13 (*)    All other components within normal limits  HEPATIC FUNCTION PANEL - Abnormal; Notable for the following:    AST 154 (*)    Alkaline Phosphatase 134 (*)    Bilirubin, Direct 0.6 (*)    All other components within normal limits  ETHANOL - Abnormal; Notable for the following:    Alcohol, Ethyl (B) 232 (*)    All other components within normal limits  URINALYSIS, ROUTINE W REFLEX MICROSCOPIC - Abnormal; Notable for the following:    APPearance CLOUDY (*)    Hgb urine dipstick TRACE (*)      Protein, ur 30 (*)    All other components within normal limits  TROPONIN I  URINE RAPID DRUG SCREEN (HOSP PERFORMED)  URINE MICROSCOPIC-ADD ON  TROPONIN I    Imaging Review Dg Chest 2 View  03/03/2014   CLINICAL DATA:  Loss of consciousness.  Initial encounter.  EXAM: CHEST  2 VIEW  COMPARISON:  10/04/2012  FINDINGS: Mild limitation due to exclusion of the posterior costophrenic sulci.  Normal heart size.  Unchanged aortic contours.  Pulmonary hyperinflation; there may be COPD. There is no edema, consolidation, effusion, or pneumothorax. No acute osseous findings.  IMPRESSION: No active cardiopulmonary disease.   Electronically Signed   By: Tiburcio Pea M.D.   On: 03/03/2014 01:45   Dg Thoracic Spine 2 View  03/03/2014   CLINICAL DATA:  Loss of consciousness  with fall. Upper back pain. Initial encounter.  EXAM: THORACIC SPINE - 2 VIEW  COMPARISON:  None.  FINDINGS: Lateral imaging of the lower thoracic spine is included on dedicated lumbar radiography. There is no evidence of acute thoracic spine fracture or subluxation. Diffuse degenerative endplate spurring. No bone lesion or endplate erosion identified.  Chronic, calcific pancreatitis.  IMPRESSION: Negative.   Electronically Signed   By: Tiburcio PeaJonathan  Watts M.D.   On: 03/03/2014 01:47   Dg Lumbar Spine Complete  03/03/2014   CLINICAL DATA:  Patient passed out, now with upper back pain.  EXAM: LUMBAR SPINE - COMPLETE 4+ VIEW  COMPARISON:  CT abdomen pelvis - 10/02/2012  FINDINGS: There are 5 non rib-bearing lumbar type vertebral bodies.  There is a mild scoliotic curvature of the thoracolumbar spine with dominant caudal component convex to the left. There is straightening expected lumbar lordosis. No anterolisthesis or retrolisthesis.  Lumbar vertebral body heights are preserved.  There is mild multilevel lumbar spine DDD, worse at L3-L4 with disc space height loss, endplate irregularity and sclerosis.  Limited visualization the bilateral SI  joints is normal.  Ill-defined punctate calcifications overlie the expected location of the pancreatic head inner compatible with the calcific appendage tree attend is demonstrated prior abdominal CT.  Regional bowel gas pattern is normal. Atherosclerotic plaque within the abdominal aorta.  IMPRESSION: 1. No acute findings. 2. Mild multilevel lumbar spine DDD, worse at L3-L4. 3. Sequela of chronic calcific pancreatitis as demonstrated on remote abdominal CT per   Electronically Signed   By: Simonne ComeJohn  Watts M.D.   On: 03/03/2014 01:48   Dg Hip Bilateral W/pelvis  03/03/2014   CLINICAL DATA:  Loss of consciousness. Fall. Left hip pain for several months, but recent worsening. Initial encounter.  EXAM: BILATERAL HIP WITH PELVIS - 4+ VIEW  COMPARISON:  10/04/2012 abdominal CT  FINDINGS: There is no evidence of hip fracture dislocation. The pelvic ring is intact.  Bilateral hip osteoarthritis with marginal spurring and subchondral cysts. Joint narrowing is worse on the left where it is moderate to advanced.  IMPRESSION: 1. No acute osseous findings. 2. 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   Electronically Signed   By: Tiburcio PeaJonathan  Watts M.D.   On: 03/03/2014 01:50   Ct Head Wo Contrast  03/03/2014   CLINICAL DATA:  Syncopal episode, now with abrasion chronic left eyebrow. Initial encounter.  EXAM: CT HEAD WITHOUT CONTRAST  CT MAXILLOFACIAL WITHOUT CONTRAST  CT CERVICAL SPINE WITHOUT CONTRAST  TECHNIQUE: Multidetector CT imaging of the head, cervical spine, and maxillofacial structures were performed using the standard protocol without intravenous contrast. Multiplanar CT image reconstructions of the cervical spine and maxillofacial structures were also generated.  COMPARISON:  None.  FINDINGS: CT HEAD FINDINGS  There is mild age advanced atrophy with diffuse sulcal prominence centralized volume loss with commensurate ex vacuo dilatation of the ventricular system. Scattered minimal periventricular  hypodensities compatible with microvascular ischemic disease. Given extensive background parenchymal abnormalities, there is no CT evidence of acute large territory infarct. No intraparenchymal or extra-axial mass or hemorrhage. Normal size and configuration of the ventricles and basilar cisterns. No midline shift. Intracranial atherosclerosis. Limited visualization the paranasal sinuses and mastoid air cells are normal.  CT MAXILLOFACIAL FINDINGS  Examination is degraded secondary to patient motion artifact.  There is apparent mild asymmetric soft tissue swelling about the left side of the forehead (image 73, series 9). This finding is without associated radiopaque foreign body or displaced facial fracture.  Normal appearance  of the bilateral pterygoid plates.  Normal appearance of the mandible. The bilateral mandibular condyles appear appropriately located.  Normal noncontrast appearance of the bilateral orbits globes. Note normal appearance of the zygomatic arches.  Minimal mucosal thickening within the bilateral maxillary sinuses, right greater than left. The remaining paranasal sinuses and mastoid air cells are normally aerated. No significant nasal septal deviation.  CT CERVICAL SPINE FINDINGS  C1 to the superior endplate of T3 is imaged.  Normal alignment of the cervical spine. No anterolisthesis or retrolisthesis. The dens is normally positioned between the lateral masses of C1. Moderate degenerative change of the atlantodental articulation. Normal atlantoaxial articulations. The bilateral facets are normally aligned. There is apparent at least partial ankylosis of the bilateral transverse facets at C3- C4 and C4-C5.  No fracture or static subluxation of the cervical spine. Cervical vertebral body heights are preserved. Prevertebral soft tissues are normal.  There is moderate severe multilevel DDD throughout the cervical spine, worse at C3-C4, C4-C5, C5-C6 and C6-C7 with disc space height loss, endplate  irregularity and small posteriorly directed disc osteophyte complexes at these locations.  Moderate to large amount of atherosclerotic plaque within the bilateral carotid bulbs. No bulky cervical lymphadenopathy on this noncontrast examination. Normal noncontrast appearance of the thyroid gland. 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  IMPRESSION: 1. 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 2. No fracture or static subluxation of the cervical spine. 3. Mild soft tissue swelling about the left side of the forehead without associated displaced fracture or radiopaque foreign body on this motion degraded maxillofacial CT. 4. 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 5. Moderate to large amount of atherosclerotic plaque within the bilateral carotid bulbs. Further evaluation with nonemergent carotid Doppler ultrasound could be performed as clinically indicated. 6. Mucosal thickening of the bilateral maxillary sinuses, right greater than left. No definitive air-fluid levels   Electronically Signed   By: Simonne Come M.D.   On: 03/03/2014 02:24   Ct Cervical Spine Wo Contrast  03/03/2014   CLINICAL DATA:  Syncopal episode, now with abrasion chronic left eyebrow. Initial encounter.  EXAM: CT HEAD WITHOUT CONTRAST  CT MAXILLOFACIAL WITHOUT CONTRAST  CT CERVICAL SPINE WITHOUT CONTRAST  TECHNIQUE: Multidetector CT imaging of the head, cervical spine, and maxillofacial structures were performed using the standard protocol without intravenous contrast. Multiplanar CT image reconstructions of the cervical spine and maxillofacial structures were also generated.  COMPARISON:  None.  FINDINGS: CT HEAD FINDINGS  There is mild age advanced atrophy with diffuse sulcal prominence centralized volume loss with commensurate ex vacuo dilatation of the ventricular system. Scattered minimal periventricular hypodensities compatible with microvascular ischemic  disease. Given extensive background parenchymal abnormalities, there is no CT evidence of acute large territory infarct. No intraparenchymal or extra-axial mass or hemorrhage. Normal size and configuration of the ventricles and basilar cisterns. No midline shift. Intracranial atherosclerosis. Limited visualization the paranasal sinuses and mastoid air cells are normal.  CT MAXILLOFACIAL FINDINGS  Examination is degraded secondary to patient motion artifact.  There is apparent mild asymmetric soft tissue swelling about the left side of the forehead (image 73, series 9). This finding is without associated radiopaque foreign body or displaced facial fracture.  Normal appearance of the bilateral pterygoid plates.  Normal appearance of the mandible. The bilateral mandibular condyles appear appropriately located.  Normal noncontrast appearance of the bilateral orbits globes. Note normal appearance of the zygomatic arches.  Minimal mucosal thickening within the bilateral maxillary sinuses,  right greater than left. The remaining paranasal sinuses and mastoid air cells are normally aerated. No significant nasal septal deviation.  CT CERVICAL SPINE FINDINGS  C1 to the superior endplate of T3 is imaged.  Normal alignment of the cervical spine. No anterolisthesis or retrolisthesis. The dens is normally positioned between the lateral masses of C1. Moderate degenerative change of the atlantodental articulation. Normal atlantoaxial articulations. The bilateral facets are normally aligned. There is apparent at least partial ankylosis of the bilateral transverse facets at C3- C4 and C4-C5.  No fracture or static subluxation of the cervical spine. Cervical vertebral body heights are preserved. Prevertebral soft tissues are normal.  There is moderate severe multilevel DDD throughout the cervical spine, worse at C3-C4, C4-C5, C5-C6 and C6-C7 with disc space height loss, endplate irregularity and small posteriorly directed disc  osteophyte complexes at these locations.  Moderate to large amount of atherosclerotic plaque within the bilateral carotid bulbs. No bulky cervical lymphadenopathy on this noncontrast examination. Normal noncontrast appearance of the thyroid gland. 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  IMPRESSION: 1. 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 2. No fracture or static subluxation of the cervical spine. 3. Mild soft tissue swelling about the left side of the forehead without associated displaced fracture or radiopaque foreign body on this motion degraded maxillofacial CT. 4. 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 5. Moderate to large amount of atherosclerotic plaque within the bilateral carotid bulbs. Further evaluation with nonemergent carotid Doppler ultrasound could be performed as clinically indicated. 6. Mucosal thickening of the bilateral maxillary sinuses, right greater than left. No definitive air-fluid levels   Electronically Signed   By: Simonne Come M.D.   On: 03/03/2014 02:24   Dg Knee Complete 4 Views Left  03/03/2014   CLINICAL DATA:  Syncope. Hit head on car door. Pain radiating to hips and lower extremity.  EXAM: LEFT KNEE - COMPLETE 4+ VIEW  COMPARISON:  None.  FINDINGS: There is no evidence of fracture, dislocation, or joint effusion. There is no evidence of arthropathy or other focal bone abnormality. Soft tissues are nonsuspicious, mild vascular calcifications.  IMPRESSION: No acute fracture deformity or dislocation.   Electronically Signed   By: Awilda Metro   On: 03/03/2014 01:49   Ct Maxillofacial Wo Cm  03/03/2014   CLINICAL DATA:  Syncopal episode, now with abrasion chronic left eyebrow. Initial encounter.  EXAM: CT HEAD WITHOUT CONTRAST  CT MAXILLOFACIAL WITHOUT CONTRAST  CT CERVICAL SPINE WITHOUT CONTRAST  TECHNIQUE: Multidetector CT imaging of the head, cervical spine, and maxillofacial  structures were performed using the standard protocol without intravenous contrast. Multiplanar CT image reconstructions of the cervical spine and maxillofacial structures were also generated.  COMPARISON:  None.  FINDINGS: CT HEAD FINDINGS  There is mild age advanced atrophy with diffuse sulcal prominence centralized volume loss with commensurate ex vacuo dilatation of the ventricular system. Scattered minimal periventricular hypodensities compatible with microvascular ischemic disease. Given extensive background parenchymal abnormalities, there is no CT evidence of acute large territory infarct. No intraparenchymal or extra-axial mass or hemorrhage. Normal size and configuration of the ventricles and basilar cisterns. No midline shift. Intracranial atherosclerosis. Limited visualization the paranasal sinuses and mastoid air cells are normal.  CT MAXILLOFACIAL FINDINGS  Examination is degraded secondary to patient motion artifact.  There is apparent mild asymmetric soft tissue swelling about the left side of the forehead (image 73, series 9). This finding is without associated radiopaque foreign body or  displaced facial fracture.  Normal appearance of the bilateral pterygoid plates.  Normal appearance of the mandible. The bilateral mandibular condyles appear appropriately located.  Normal noncontrast appearance of the bilateral orbits globes. Note normal appearance of the zygomatic arches.  Minimal mucosal thickening within the bilateral maxillary sinuses, right greater than left. The remaining paranasal sinuses and mastoid air cells are normally aerated. No significant nasal septal deviation.  CT CERVICAL SPINE FINDINGS  C1 to the superior endplate of T3 is imaged.  Normal alignment of the cervical spine. No anterolisthesis or retrolisthesis. The dens is normally positioned between the lateral masses of C1. Moderate degenerative change of the atlantodental articulation. Normal atlantoaxial articulations. The  bilateral facets are normally aligned. There is apparent at least partial ankylosis of the bilateral transverse facets at C3- C4 and C4-C5.  No fracture or static subluxation of the cervical spine. Cervical vertebral body heights are preserved. Prevertebral soft tissues are normal.  There is moderate severe multilevel DDD throughout the cervical spine, worse at C3-C4, C4-C5, C5-C6 and C6-C7 with disc space height loss, endplate irregularity and small posteriorly directed disc osteophyte complexes at these locations.  Moderate to large amount of atherosclerotic plaque within the bilateral carotid bulbs. No bulky cervical lymphadenopathy on this noncontrast examination. Normal noncontrast appearance of the thyroid gland. 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  IMPRESSION: 1. 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 2. No fracture or static subluxation of the cervical spine. 3. Mild soft tissue swelling about the left side of the forehead without associated displaced fracture or radiopaque foreign body on this motion degraded maxillofacial CT. 4. 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 5. Moderate to large amount of atherosclerotic plaque within the bilateral carotid bulbs. Further evaluation with nonemergent carotid Doppler ultrasound could be performed as clinically indicated. 6. Mucosal thickening of the bilateral maxillary sinuses, right greater than left. No definitive air-fluid levels   Electronically Signed   By: Simonne ComeJohn  Watts M.D.   On: 03/03/2014 02:24     EKG Interpretation   Date/Time:  Tuesday March 03 2014 00:22:09 EDT Ventricular Rate:  72 PR Interval:  171 QRS Duration: 96 QT Interval:  455 QTC Calculation: 498 R Axis:   -87 Text Interpretation:  Sinus rhythm Probable left atrial enlargement LAD,  consider left anterior fascicular block Anteroseptal infarct, age  indeterminate T wave inversion V2-V4  Confirmed by Erroll Lunani, Adeleke Ayokunle  [phone]([id number]) on 03/03/2014 12:51:27 AM      MDM   Final diagnoses:  Syncope and collapse  Acute renal failure, unspecified acute renal failure type  Chest pain, unspecified chest pain type  Elevated liver enzymes  Alcohol abuse    Medications  sodium chloride 0.9 % bolus 1,000 mL (1,000 mLs Intravenous New Bag/Given 03/03/14 0044)   Filed Vitals:   03/02/14 1830 03/02/14 2233 03/02/14 2337  BP: 113/71 111/67 159/87  Pulse: 88 86   Temp: 98.2 F (36.8 C) 98 F (36.7 C)   TempSrc: Oral Oral   Resp: 20 16 15   SpO2: 99% 100% 95%   EKG noted sinus rhythm with a heart rate of 72 beats per minute with probable left atrial enlargement, indeterminate T wave inversion at V2-V4. Troponin negative elevation. CBC noted mildly low white blood cell count of 3.6. Hemoglobin 12.0, hematocrit 33.7. Low platelets of 95. BMP noted elevated creatinine of 2.78 - when compared to 9 months ago patient's creatinine has doubled from 0.88. Hepatic function panel noted elevated AST  of 154 and an elevated bilirubin of 0.6. Increased anion gap of 22 mEq/L - bicarb 26. Ethanol elevated at 232. Urinalysis negative for nitrites or leukocytes. Urine drug screen negative. CT head negative for acute intracranial abnormalities. CT cervical spine unremarkable. CT maxillofacial negative for acute osseous injury. Chest x-ray negative. Plain film of thoracic spine negative for acute osseous injury. Plain film of lumbar spine negative for acute osseous injury-multilevel lumbar degenerative disc disease noted. Bilateral hip with pelvis noted osteoarthritis, left worse than the right. Left knee negative for acute osseous injury or dislocation. US abdomen noted gallbladder sludge with no sign of acute cholecystitis - no biliary obstruction to explain elevated bilirubin - hepatic steatosis noted.  Patient presenting to the ED with syncope episode that occurred today - suspicion to be due to possible  acute alcohol intoxication. Patient currently intoxicated with alcohol level of 232. Patient reported chest pain before and after the event - unremarkable labs. Acute renal injury noted. Liver enzymes mildly elevated - suspicion to be due to chronic alcohol abuse. Gallbladder sludge without acute cholecystitis - may need HIDA scan, patient asymptomatic at this time. Patient to be admitted to the hospital - admitted to telemetry. Patient agreed to plan of admission. Patient stable for transfer.   Raymon Mutton, PA-C 03/03/14 1621
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
## 2014-03-03 ENCOUNTER — Emergency Department (HOSPITAL_COMMUNITY): Payer: Medicaid Other

## 2014-03-03 ENCOUNTER — Inpatient Hospital Stay (HOSPITAL_COMMUNITY): Payer: Medicaid Other

## 2014-03-03 ENCOUNTER — Encounter (HOSPITAL_COMMUNITY): Payer: Self-pay | Admitting: Radiology

## 2014-03-03 DIAGNOSIS — E872 Acidosis: Secondary | ICD-10-CM | POA: Diagnosis present

## 2014-03-03 DIAGNOSIS — K8020 Calculus of gallbladder without cholecystitis without obstruction: Secondary | ICD-10-CM | POA: Diagnosis present

## 2014-03-03 DIAGNOSIS — R569 Unspecified convulsions: Secondary | ICD-10-CM | POA: Diagnosis present

## 2014-03-03 DIAGNOSIS — I6523 Occlusion and stenosis of bilateral carotid arteries: Secondary | ICD-10-CM | POA: Diagnosis present

## 2014-03-03 DIAGNOSIS — D72819 Decreased white blood cell count, unspecified: Secondary | ICD-10-CM | POA: Diagnosis present

## 2014-03-03 DIAGNOSIS — E785 Hyperlipidemia, unspecified: Secondary | ICD-10-CM | POA: Diagnosis present

## 2014-03-03 DIAGNOSIS — K861 Other chronic pancreatitis: Secondary | ICD-10-CM | POA: Diagnosis present

## 2014-03-03 DIAGNOSIS — Z720 Tobacco use: Secondary | ICD-10-CM | POA: Diagnosis not present

## 2014-03-03 DIAGNOSIS — I10 Essential (primary) hypertension: Secondary | ICD-10-CM | POA: Diagnosis not present

## 2014-03-03 DIAGNOSIS — N179 Acute kidney failure, unspecified: Secondary | ICD-10-CM | POA: Diagnosis present

## 2014-03-03 DIAGNOSIS — F17210 Nicotine dependence, cigarettes, uncomplicated: Secondary | ICD-10-CM | POA: Diagnosis present

## 2014-03-03 DIAGNOSIS — E119 Type 2 diabetes mellitus without complications: Secondary | ICD-10-CM | POA: Diagnosis not present

## 2014-03-03 DIAGNOSIS — G8929 Other chronic pain: Secondary | ICD-10-CM | POA: Diagnosis not present

## 2014-03-03 DIAGNOSIS — Z79899 Other long term (current) drug therapy: Secondary | ICD-10-CM | POA: Diagnosis not present

## 2014-03-03 DIAGNOSIS — Z881 Allergy status to other antibiotic agents status: Secondary | ICD-10-CM | POA: Diagnosis not present

## 2014-03-03 DIAGNOSIS — K7030 Alcoholic cirrhosis of liver without ascites: Secondary | ICD-10-CM | POA: Diagnosis present

## 2014-03-03 DIAGNOSIS — R55 Syncope and collapse: Secondary | ICD-10-CM

## 2014-03-03 DIAGNOSIS — F10120 Alcohol abuse with intoxication, uncomplicated: Secondary | ICD-10-CM

## 2014-03-03 DIAGNOSIS — K7010 Alcoholic hepatitis without ascites: Secondary | ICD-10-CM

## 2014-03-03 DIAGNOSIS — F10129 Alcohol abuse with intoxication, unspecified: Secondary | ICD-10-CM | POA: Diagnosis present

## 2014-03-03 DIAGNOSIS — E860 Dehydration: Secondary | ICD-10-CM | POA: Diagnosis present

## 2014-03-03 DIAGNOSIS — E8729 Other acidosis: Secondary | ICD-10-CM | POA: Diagnosis present

## 2014-03-03 DIAGNOSIS — E440 Moderate protein-calorie malnutrition: Secondary | ICD-10-CM | POA: Diagnosis present

## 2014-03-03 DIAGNOSIS — Z681 Body mass index (BMI) 19 or less, adult: Secondary | ICD-10-CM | POA: Diagnosis not present

## 2014-03-03 DIAGNOSIS — M1990 Unspecified osteoarthritis, unspecified site: Secondary | ICD-10-CM | POA: Diagnosis not present

## 2014-03-03 DIAGNOSIS — F1290 Cannabis use, unspecified, uncomplicated: Secondary | ICD-10-CM | POA: Diagnosis present

## 2014-03-03 DIAGNOSIS — K760 Fatty (change of) liver, not elsewhere classified: Secondary | ICD-10-CM | POA: Diagnosis present

## 2014-03-03 DIAGNOSIS — D696 Thrombocytopenia, unspecified: Secondary | ICD-10-CM | POA: Diagnosis present

## 2014-03-03 DIAGNOSIS — D61818 Other pancytopenia: Secondary | ICD-10-CM | POA: Diagnosis present

## 2014-03-03 DIAGNOSIS — K219 Gastro-esophageal reflux disease without esophagitis: Secondary | ICD-10-CM | POA: Diagnosis present

## 2014-03-03 DIAGNOSIS — F10929 Alcohol use, unspecified with intoxication, unspecified: Secondary | ICD-10-CM | POA: Diagnosis present

## 2014-03-03 DIAGNOSIS — R531 Weakness: Secondary | ICD-10-CM | POA: Diagnosis not present

## 2014-03-03 LAB — HEPATIC FUNCTION PANEL
ALBUMIN: 3.8 g/dL (ref 3.5–5.2)
ALT: 38 U/L (ref 0–53)
AST: 154 U/L — ABNORMAL HIGH (ref 0–37)
Alkaline Phosphatase: 134 U/L — ABNORMAL HIGH (ref 39–117)
BILIRUBIN TOTAL: 1.2 mg/dL (ref 0.3–1.2)
Bilirubin, Direct: 0.6 mg/dL — ABNORMAL HIGH (ref 0.0–0.3)
Indirect Bilirubin: 0.6 mg/dL (ref 0.3–0.9)
Total Protein: 8.3 g/dL (ref 6.0–8.3)

## 2014-03-03 LAB — CBC
HEMATOCRIT: 33.9 % — AB (ref 39.0–52.0)
Hemoglobin: 12 g/dL — ABNORMAL LOW (ref 13.0–17.0)
MCH: 35.3 pg — AB (ref 26.0–34.0)
MCHC: 35.4 g/dL (ref 30.0–36.0)
MCV: 99.7 fL (ref 78.0–100.0)
Platelets: 90 10*3/uL — ABNORMAL LOW (ref 150–400)
RBC: 3.4 MIL/uL — ABNORMAL LOW (ref 4.22–5.81)
RDW: 13.7 % (ref 11.5–15.5)
WBC: 3 10*3/uL — ABNORMAL LOW (ref 4.0–10.5)

## 2014-03-03 LAB — TROPONIN I
Troponin I: <0.3 ng/mL (ref <0.30)
Troponin I: <0.3 ng/mL (ref <0.30)
Troponin I: <0.3 ng/mL (ref <0.30)
Troponin I: <0.3 ng/mL

## 2014-03-03 LAB — RAPID URINE DRUG SCREEN, HOSP PERFORMED
Amphetamines: NOT DETECTED
BARBITURATES: NOT DETECTED
Benzodiazepines: NOT DETECTED
Cocaine: NOT DETECTED
Opiates: NOT DETECTED
Tetrahydrocannabinol: NOT DETECTED

## 2014-03-03 LAB — COMPREHENSIVE METABOLIC PANEL
ALK PHOS: 130 U/L — AB (ref 39–117)
ALT: 37 U/L (ref 0–53)
AST: 164 U/L — AB (ref 0–37)
Albumin: 3.5 g/dL (ref 3.5–5.2)
Anion gap: 24 — ABNORMAL HIGH (ref 5–15)
BILIRUBIN TOTAL: 1.1 mg/dL (ref 0.3–1.2)
BUN: 22 mg/dL (ref 6–23)
CHLORIDE: 95 meq/L — AB (ref 96–112)
CO2: 24 meq/L (ref 19–32)
Calcium: 8.2 mg/dL — ABNORMAL LOW (ref 8.4–10.5)
Creatinine, Ser: 1.83 mg/dL — ABNORMAL HIGH (ref 0.50–1.35)
GFR calc Af Amer: 45 mL/min — ABNORMAL LOW (ref >90)
GFR calc non Af Amer: 39 mL/min — ABNORMAL LOW (ref >90)
Glucose, Bld: 92 mg/dL (ref 70–99)
Potassium: 3.4 mEq/L — ABNORMAL LOW (ref 3.7–5.3)
Sodium: 143 mEq/L (ref 137–147)
Total Protein: 7.9 g/dL (ref 6.0–8.3)

## 2014-03-03 LAB — URINALYSIS, ROUTINE W REFLEX MICROSCOPIC
BILIRUBIN URINE: NEGATIVE
Glucose, UA: NEGATIVE mg/dL
Ketones, ur: NEGATIVE mg/dL
Leukocytes, UA: NEGATIVE
Nitrite: NEGATIVE
Protein, ur: 30 mg/dL — AB
SPECIFIC GRAVITY, URINE: 1.014 (ref 1.005–1.030)
UROBILINOGEN UA: 1 mg/dL (ref 0.0–1.0)
pH: 5.5 (ref 5.0–8.0)

## 2014-03-03 LAB — URINE MICROSCOPIC-ADD ON

## 2014-03-03 LAB — CK TOTAL AND CKMB (NOT AT ARMC)
CK, MB: 5 ng/mL — ABNORMAL HIGH (ref 0.3–4.0)
Relative Index: 1.7 (ref 0.0–2.5)
Total CK: 294 U/L — ABNORMAL HIGH (ref 7–232)

## 2014-03-03 LAB — LIPASE, BLOOD: Lipase: 7 U/L — ABNORMAL LOW (ref 11–59)

## 2014-03-03 LAB — ETHANOL
ALCOHOL ETHYL (B): 232 mg/dL — AB (ref 0–11)
Alcohol, Ethyl (B): 160 mg/dL — ABNORMAL HIGH (ref 0–11)

## 2014-03-03 LAB — PHENYTOIN LEVEL, TOTAL: Phenytoin Lvl: 5.1 ug/mL — ABNORMAL LOW (ref 10.0–20.0)

## 2014-03-03 MED ORDER — PROSIGHT PO TABS
1.0000 | ORAL_TABLET | Freq: Every day | ORAL | Status: DC
  Administered 2014-03-03 – 2014-03-04 (×2): 1 via ORAL
  Filled 2014-03-03 (×2): qty 1

## 2014-03-03 MED ORDER — GABAPENTIN 300 MG PO CAPS
300.0000 mg | ORAL_CAPSULE | Freq: Three times a day (TID) | ORAL | Status: DC
  Administered 2014-03-03 – 2014-03-04 (×5): 300 mg via ORAL
  Filled 2014-03-03 (×6): qty 1

## 2014-03-03 MED ORDER — ONDANSETRON HCL 4 MG/2ML IJ SOLN: 4.0000 mg | Freq: Four times a day (QID) | INTRAMUSCULAR | Status: DC | PRN

## 2014-03-03 MED ORDER — SODIUM CHLORIDE 0.9 % IJ SOLN
3.0000 mL | Freq: Two times a day (BID) | INTRAMUSCULAR | Status: DC
  Administered 2014-03-03: 3 mL via INTRAVENOUS

## 2014-03-03 MED ORDER — SODIUM CHLORIDE 0.9 % IV SOLN
INTRAVENOUS | Status: DC
  Administered 2014-03-03 – 2014-03-04 (×3): via INTRAVENOUS

## 2014-03-03 MED ORDER — ONDANSETRON HCL 4 MG PO TABS
4.0000 mg | ORAL_TABLET | Freq: Four times a day (QID) | ORAL | Status: DC | PRN
Start: 2014-03-03 — End: 2014-03-04

## 2014-03-03 MED ORDER — METOPROLOL TARTRATE 25 MG PO TABS
12.5000 mg | ORAL_TABLET | Freq: Two times a day (BID) | ORAL | Status: DC
  Administered 2014-03-03 – 2014-03-04 (×3): 12.5 mg via ORAL
  Filled 2014-03-03 (×3): qty 1

## 2014-03-03 MED ORDER — LORAZEPAM 2 MG/ML IJ SOLN
1.0000 mg | Freq: Four times a day (QID) | INTRAMUSCULAR | Status: DC | PRN
  Administered 2014-03-03 – 2014-03-04 (×2): 1 mg via INTRAVENOUS
  Filled 2014-03-03 (×2): qty 1

## 2014-03-03 MED ORDER — INFLUENZA VAC SPLIT QUAD 0.5 ML IM SUSY
0.5000 mL | PREFILLED_SYRINGE | INTRAMUSCULAR | Status: DC
  Filled 2014-03-03: qty 0.5

## 2014-03-03 MED ORDER — LORAZEPAM 1 MG PO TABS: 1.0000 mg | ORAL_TABLET | Freq: Four times a day (QID) | ORAL | Status: DC | PRN

## 2014-03-03 MED ORDER — OXYCODONE HCL 5 MG PO TABS
5.0000 mg | ORAL_TABLET | ORAL | Status: DC | PRN
  Administered 2014-03-04 (×2): 5 mg via ORAL
  Filled 2014-03-03 (×2): qty 1

## 2014-03-03 MED ORDER — FOLIC ACID 1 MG PO TABS
1.0000 mg | ORAL_TABLET | Freq: Every day | ORAL | Status: DC
  Administered 2014-03-03 – 2014-03-04 (×2): 1 mg via ORAL
  Filled 2014-03-03 (×2): qty 1

## 2014-03-03 MED ORDER — PHENYTOIN SODIUM EXTENDED 100 MG PO CAPS
300.0000 mg | ORAL_CAPSULE | Freq: Once | ORAL | Status: AC
  Administered 2014-03-03: 300 mg via ORAL
  Filled 2014-03-03: qty 3

## 2014-03-03 MED ORDER — SODIUM CHLORIDE 0.9 % IV BOLUS (SEPSIS)
1000.0000 mL | Freq: Once | INTRAVENOUS | Status: AC
  Administered 2014-03-03: 1000 mL via INTRAVENOUS

## 2014-03-03 MED ORDER — PANTOPRAZOLE SODIUM 40 MG PO TBEC
40.0000 mg | DELAYED_RELEASE_TABLET | Freq: Every day | ORAL | Status: DC
  Administered 2014-03-03 – 2014-03-04 (×2): 40 mg via ORAL
  Filled 2014-03-03 (×2): qty 1

## 2014-03-03 MED ORDER — PNEUMOCOCCAL VAC POLYVALENT 25 MCG/0.5ML IJ INJ
0.5000 mL | INJECTION | INTRAMUSCULAR | Status: DC
  Filled 2014-03-03: qty 0.5

## 2014-03-03 MED ORDER — MAGNESIUM OXIDE 400 (241.3 MG) MG PO TABS
400.0000 mg | ORAL_TABLET | Freq: Every day | ORAL | Status: DC
  Administered 2014-03-03 – 2014-03-04 (×2): 400 mg via ORAL
  Filled 2014-03-03 (×2): qty 1

## 2014-03-03 MED ORDER — ENSURE COMPLETE PO LIQD
237.0000 mL | Freq: Three times a day (TID) | ORAL | Status: DC
  Administered 2014-03-03 – 2014-03-04 (×3): 237 mL via ORAL

## 2014-03-03 MED ORDER — POTASSIUM CHLORIDE CRYS ER 20 MEQ PO TBCR
40.0000 meq | EXTENDED_RELEASE_TABLET | Freq: Once | ORAL | Status: AC
  Administered 2014-03-03: 40 meq via ORAL
  Filled 2014-03-03: qty 2

## 2014-03-03 MED ORDER — VITAMIN B-1 100 MG PO TABS
100.0000 mg | ORAL_TABLET | Freq: Every day | ORAL | Status: DC
  Administered 2014-03-03 – 2014-03-04 (×2): 100 mg via ORAL
  Filled 2014-03-03 (×2): qty 1

## 2014-03-03 MED ORDER — MORPHINE SULFATE 2 MG/ML IJ SOLN
2.0000 mg | INTRAMUSCULAR | Status: DC | PRN
  Administered 2014-03-03 – 2014-03-04 (×5): 2 mg via INTRAVENOUS
  Filled 2014-03-03 (×5): qty 1

## 2014-03-03 NOTE — Care Management Note (Signed)
    Page 1 of 1   03/03/2014     1:37:29 PM CARE MANAGEMENT NOTE 03/03/2014  Patient:  Joelene MillinBREEDEN,Zarion   Account Number:  [id number]  Date Initiated:  03/03/2014  Documentation initiated by:  Lanier ClamMAHABIR,Riata Ikeda  Subjective/Objective Assessment:   58 Y/O M ADMITTED [id number].     Action/Plan:   FROM HOME.   Anticipated DC Date:  03/06/2014   Anticipated DC Plan:  HOME/SELF CARE      DC Planning Services  CM consult      Choice offered to / List presented to:             Status of service:  In process, will continue to follow Medicare Important Message given?   (If response is "NO", the following Medicare IM given date fields will be blank) Date Medicare IM given:   Medicare IM given by:   Date Additional Medicare IM given:   Additional Medicare IM given by:    Discharge Disposition:    Per UR Regulation:  Reviewed for med. necessity/level of care/duration of stay  If discussed at Long Length of Stay Meetings, dates discussed:    Comments:  03/03/14 Rilla Buckman RN,BSN NCM [phone] NO ANTICIPATED D/C NEEDS.

## 2014-03-03 NOTE — ED Notes (Signed)
Patient waiting on ultrasound before going to the floor per MD.

## 2014-03-03 NOTE — Progress Notes (Signed)
VASCULAR LAB PRELIMINARY  PRELIMINARY  PRELIMINARY  PRELIMINARY  Carotid duplex completed.    Preliminary report:  Bilateral:  1-39% ICA stenosis.  Vertebral artery flow is antegrade.     Abdi Husak, RVS 03/03/2014, 11:38 AM

## 2014-03-03 NOTE — H&P (Signed)
[facility] History and Physical  Dennis MillinLevonda Zhang ZOX:[id number] DOB: 07/07/1955 DOA: 03/02/2014  Referring physician: ED physician PCP: Jyl HeinzARMOUR, ROSS B, MD   Chief Complaint: syncopal event  HPI:  Pt is 58 yo male with known history of alcohol abuse, [facility] with main concern of syncopal event earlier today prior to this admission which was preceded by sudden onset of mid area chest pain, pressure like, lasting several minutes with shortness of breath. Pt currently denies chest pain or shortness of breath. Pt reports alcohol use. He also reports similar events in the past. In addition he reports generalized body aches. No fevers, chills, no abd or urinary concerns.   In ED, pt noted to be hemodynamically stable, VSS, blood work notable for alcohol level > 200, mild leukopenia and thrombocytopenia, Cr 2.78. [facility] asked to admit to telemetry bed for further evaluation.   Assessment and Plan: Active Problems: Syncopal event  - this is likley multifactorial in etiology and secondary to alcohol intoxication, dehydration, mod atherosclerotic plaque in bilateral carotids noted on imagine below, so also wondering if this is contributing - will admit tot telemetry bed - provide supportive care with IVF, check orthostatic vitals, US carotid dopplers also recommended - no fracture on imaging noted  - cycle CE's, monitor closely on telemetry Acute alcohol intoxication - provide supportive care - monitor for signs of withdrawal - repeat Alcohol level in AM Chest pressure - monitor on tele - cycle CE's  Acute renal failure - secondary to dehydration - provide IVF and repeat BMP in AM Transaminitis - secondary to alcohol intoxication - repeat CMET In AM Leukopenia and thrombocytopenia - secondary to bone marrow damage from alcohol use - no signs of active bleeding - SCD's for DVT prophylaxis - repeat CBC in AM   Radiological Exams on Admission: Dg Chest 2  View  03/03/2014   CLINICAL DATA:  Loss of consciousness.  Initial encounter.  EXAM: CHEST  2 VIEW  COMPARISON:  10/04/2012  FINDINGS: Mild limitation due to exclusion of the posterior costophrenic sulci.  Normal heart size.  Unchanged aortic contours.  Pulmonary hyperinflation; there may be COPD. There is no edema, consolidation, effusion, or pneumothorax. No acute osseous findings.  IMPRESSION: No active cardiopulmonary disease.   Electronically Signed   By: Tiburcio PeaJonathan  Watts M.D.   On: 03/03/2014 01:45   Dg Thoracic Spine 2 View  03/03/2014   CLINICAL DATA:  Loss of consciousness with fall. Upper back pain. Initial encounter.  EXAM: THORACIC SPINE - 2 VIEW  COMPARISON:  None.  FINDINGS: Lateral imaging of the lower thoracic spine is included on dedicated lumbar radiography. There is no evidence of acute thoracic spine fracture or subluxation. Diffuse degenerative endplate spurring. No bone lesion or endplate erosion identified.  Chronic, calcific pancreatitis.  IMPRESSION: Negative.   Electronically Signed   By: Tiburcio PeaJonathan  Watts M.D.   On: 03/03/2014 01:47   Dg Lumbar Spine Complete  03/03/2014   CLINICAL DATA:  Patient passed out, now with upper back pain.  EXAM: LUMBAR SPINE - COMPLETE 4+ VIEW  COMPARISON:  CT abdomen pelvis - 10/02/2012  FINDINGS: There are 5 non rib-bearing lumbar type vertebral bodies.  There is a mild scoliotic curvature of the thoracolumbar spine with dominant caudal component convex to the left. There is straightening expected lumbar lordosis. No anterolisthesis or retrolisthesis.  Lumbar vertebral body heights are preserved.  There is mild multilevel lumbar spine DDD, worse at L3-L4 with disc space height loss, endplate irregularity and  sclerosis.  Limited visualization the bilateral SI joints is normal.  Ill-defined punctate calcifications overlie the expected location of the pancreatic head inner compatible with the calcific appendage tree attend is demonstrated prior abdominal  CT.  Regional bowel gas pattern is normal. Atherosclerotic plaque within the abdominal aorta.  IMPRESSION: 1. No acute findings. 2. Mild multilevel lumbar spine DDD, worse at L3-L4. 3. Sequela of chronic calcific pancreatitis as demonstrated on remote abdominal CT per   Electronically Signed   By: Simonne Come M.D.   On: 03/03/2014 01:48   Dg Hip Bilateral W/pelvis  03/03/2014   CLINICAL DATA:  Loss of consciousness. Fall. Left hip pain for several months, but recent worsening. Initial encounter.  EXAM: BILATERAL HIP WITH PELVIS - 4+ VIEW  COMPARISON:  10/04/2012 abdominal CT  FINDINGS: There is no evidence of hip fracture dislocation. The pelvic ring is intact.  Bilateral hip osteoarthritis with marginal spurring and subchondral cysts. Joint narrowing is worse on the left where it is moderate to advanced.  IMPRESSION: 1. No acute osseous findings. 2. 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   Electronically Signed   By: Tiburcio Pea M.D.   On: 03/03/2014 01:50   Ct Head Wo Contrast  03/03/2014   CLINICAL DATA:  Syncopal episode, now with abrasion chronic left eyebrow. Initial encounter.  EXAM: CT HEAD WITHOUT CONTRAST  CT MAXILLOFACIAL WITHOUT CONTRAST  CT CERVICAL SPINE WITHOUT CONTRAST  TECHNIQUE: Multidetector CT imaging of the head, cervical spine, and maxillofacial structures were performed using the standard protocol without intravenous contrast. Multiplanar CT image reconstructions of the cervical spine and maxillofacial structures were also generated.  COMPARISON:  None.  FINDINGS: CT HEAD FINDINGS  There is mild age advanced atrophy with diffuse sulcal prominence centralized volume loss with commensurate ex vacuo dilatation of the ventricular system. Scattered minimal periventricular hypodensities compatible with microvascular ischemic disease. Given extensive background parenchymal abnormalities, there is no CT evidence of acute large territory infarct. No intraparenchymal or  extra-axial mass or hemorrhage. Normal size and configuration of the ventricles and basilar cisterns. No midline shift. Intracranial atherosclerosis. Limited visualization the paranasal sinuses and mastoid air cells are normal.  CT MAXILLOFACIAL FINDINGS  Examination is degraded secondary to patient motion artifact.  There is apparent mild asymmetric soft tissue swelling about the left side of the forehead (image 73, series 9). This finding is without associated radiopaque foreign body or displaced facial fracture.  Normal appearance of the bilateral pterygoid plates.  Normal appearance of the mandible. The bilateral mandibular condyles appear appropriately located.  Normal noncontrast appearance of the bilateral orbits globes. Note normal appearance of the zygomatic arches.  Minimal mucosal thickening within the bilateral maxillary sinuses, right greater than left. The remaining paranasal sinuses and mastoid air cells are normally aerated. No significant nasal septal deviation.  CT CERVICAL SPINE FINDINGS  C1 to the superior endplate of T3 is imaged.  Normal alignment of the cervical spine. No anterolisthesis or retrolisthesis. The dens is normally positioned between the lateral masses of C1. Moderate degenerative change of the atlantodental articulation. Normal atlantoaxial articulations. The bilateral facets are normally aligned. There is apparent at least partial ankylosis of the bilateral transverse facets at C3- C4 and C4-C5.  No fracture or static subluxation of the cervical spine. Cervical vertebral body heights are preserved. Prevertebral soft tissues are normal.  There is moderate severe multilevel DDD throughout the cervical spine, worse at C3-C4, C4-C5, C5-C6 and C6-C7 with disc space height loss, endplate irregularity and small  posteriorly directed disc osteophyte complexes at these locations.  Moderate to large amount of atherosclerotic plaque within the bilateral carotid bulbs. No bulky cervical  lymphadenopathy on this noncontrast examination. Normal noncontrast appearance of the thyroid gland. 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  IMPRESSION: 1. 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 2. No fracture or static subluxation of the cervical spine. 3. Mild soft tissue swelling about the left side of the forehead without associated displaced fracture or radiopaque foreign body on this motion degraded maxillofacial CT. 4. 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 5. Moderate to large amount of atherosclerotic plaque within the bilateral carotid bulbs. Further evaluation with nonemergent carotid Doppler ultrasound could be performed as clinically indicated. 6. Mucosal thickening of the bilateral maxillary sinuses, right greater than left. No definitive air-fluid levels   Electronically Signed   By: Simonne Come M.D.   On: 03/03/2014 02:24   Ct Cervical Spine Wo Contrast  03/03/2014   CLINICAL DATA:  Syncopal episode, now with abrasion chronic left eyebrow. Initial encounter.  EXAM: CT HEAD WITHOUT CONTRAST  CT MAXILLOFACIAL WITHOUT CONTRAST  CT CERVICAL SPINE WITHOUT CONTRAST  TECHNIQUE: Multidetector CT imaging of the head, cervical spine, and maxillofacial structures were performed using the standard protocol without intravenous contrast. Multiplanar CT image reconstructions of the cervical spine and maxillofacial structures were also generated.  COMPARISON:  None.  FINDINGS: CT HEAD FINDINGS  There is mild age advanced atrophy with diffuse sulcal prominence centralized volume loss with commensurate ex vacuo dilatation of the ventricular system. Scattered minimal periventricular hypodensities compatible with microvascular ischemic disease. Given extensive background parenchymal abnormalities, there is no CT evidence of acute large territory infarct. No intraparenchymal or extra-axial mass or hemorrhage. Normal size and  configuration of the ventricles and basilar cisterns. No midline shift. Intracranial atherosclerosis. Limited visualization the paranasal sinuses and mastoid air cells are normal.  CT MAXILLOFACIAL FINDINGS  Examination is degraded secondary to patient motion artifact.  There is apparent mild asymmetric soft tissue swelling about the left side of the forehead (image 73, series 9). This finding is without associated radiopaque foreign body or displaced facial fracture.  Normal appearance of the bilateral pterygoid plates.  Normal appearance of the mandible. The bilateral mandibular condyles appear appropriately located.  Normal noncontrast appearance of the bilateral orbits globes. Note normal appearance of the zygomatic arches.  Minimal mucosal thickening within the bilateral maxillary sinuses, right greater than left. The remaining paranasal sinuses and mastoid air cells are normally aerated. No significant nasal septal deviation.  CT CERVICAL SPINE FINDINGS  C1 to the superior endplate of T3 is imaged.  Normal alignment of the cervical spine. No anterolisthesis or retrolisthesis. The dens is normally positioned between the lateral masses of C1. Moderate degenerative change of the atlantodental articulation. Normal atlantoaxial articulations. The bilateral facets are normally aligned. There is apparent at least partial ankylosis of the bilateral transverse facets at C3- C4 and C4-C5.  No fracture or static subluxation of the cervical spine. Cervical vertebral body heights are preserved. Prevertebral soft tissues are normal.  There is moderate severe multilevel DDD throughout the cervical spine, worse at C3-C4, C4-C5, C5-C6 and C6-C7 with disc space height loss, endplate irregularity and small posteriorly directed disc osteophyte complexes at these locations.  Moderate to large amount of atherosclerotic plaque within the bilateral carotid bulbs. No bulky cervical lymphadenopathy on this noncontrast examination.  Normal noncontrast appearance of the thyroid gland. Bullous emphysematous change within the bilateral  lung apices.  IMPRESSION: 1. 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 2. No fracture or static subluxation of the cervical spine. 3. Mild soft tissue swelling about the left side of the forehead without associated displaced fracture or radiopaque foreign body on this motion degraded maxillofacial CT. 4. 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 5. Moderate to large amount of atherosclerotic plaque within the bilateral carotid bulbs. Further evaluation with nonemergent carotid Doppler ultrasound could be performed as clinically indicated. 6. Mucosal thickening of the bilateral maxillary sinuses, right greater than left. No definitive air-fluid levels   Electronically Signed   By: Simonne Come M.D.   On: 03/03/2014 02:24   Dg Knee Complete 4 Views Left  03/03/2014   CLINICAL DATA:  Syncope. Hit head on car door. Pain radiating to hips and lower extremity.  EXAM: LEFT KNEE - COMPLETE 4+ VIEW  COMPARISON:  None.  FINDINGS: There is no evidence of fracture, dislocation, or joint effusion. There is no evidence of arthropathy or other focal bone abnormality. Soft tissues are nonsuspicious, mild vascular calcifications.  IMPRESSION: No acute fracture deformity or dislocation.   Electronically Signed   By: Awilda Metro   On: 03/03/2014 01:49   Ct Maxillofacial Wo Cm  03/03/2014   CLINICAL DATA:  Syncopal episode, now with abrasion chronic left eyebrow. Initial encounter.  EXAM: CT HEAD WITHOUT CONTRAST  CT MAXILLOFACIAL WITHOUT CONTRAST  CT CERVICAL SPINE WITHOUT CONTRAST  TECHNIQUE: Multidetector CT imaging of the head, cervical spine, and maxillofacial structures were performed using the standard protocol without intravenous contrast. Multiplanar CT image reconstructions of the cervical spine and maxillofacial structures were also generated.   COMPARISON:  None.  FINDINGS: CT HEAD FINDINGS  There is mild age advanced atrophy with diffuse sulcal prominence centralized volume loss with commensurate ex vacuo dilatation of the ventricular system. Scattered minimal periventricular hypodensities compatible with microvascular ischemic disease. Given extensive background parenchymal abnormalities, there is no CT evidence of acute large territory infarct. No intraparenchymal or extra-axial mass or hemorrhage. Normal size and configuration of the ventricles and basilar cisterns. No midline shift. Intracranial atherosclerosis. Limited visualization the paranasal sinuses and mastoid air cells are normal.  CT MAXILLOFACIAL FINDINGS  Examination is degraded secondary to patient motion artifact.  There is apparent mild asymmetric soft tissue swelling about the left side of the forehead (image 73, series 9). This finding is without associated radiopaque foreign body or displaced facial fracture.  Normal appearance of the bilateral pterygoid plates.  Normal appearance of the mandible. The bilateral mandibular condyles appear appropriately located.  Normal noncontrast appearance of the bilateral orbits globes. Note normal appearance of the zygomatic arches.  Minimal mucosal thickening within the bilateral maxillary sinuses, right greater than left. The remaining paranasal sinuses and mastoid air cells are normally aerated. No significant nasal septal deviation.  CT CERVICAL SPINE FINDINGS  C1 to the superior endplate of T3 is imaged.  Normal alignment of the cervical spine. No anterolisthesis or retrolisthesis. The dens is normally positioned between the lateral masses of C1. Moderate degenerative change of the atlantodental articulation. Normal atlantoaxial articulations. The bilateral facets are normally aligned. There is apparent at least partial ankylosis of the bilateral transverse facets at C3- C4 and C4-C5.  No fracture or static subluxation of the cervical spine.  Cervical vertebral body heights are preserved. Prevertebral soft tissues are normal.  There is moderate severe multilevel DDD throughout the cervical spine, worse at C3-C4, C4-C5, C5-C6 and C6-C7 with disc  space height loss, endplate irregularity and small posteriorly directed disc osteophyte complexes at these locations.  Moderate to large amount of atherosclerotic plaque within the bilateral carotid bulbs. No bulky cervical lymphadenopathy on this noncontrast examination. Normal noncontrast appearance of the thyroid gland. 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  IMPRESSION: 1. 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 2. No fracture or static subluxation of the cervical spine. 3. Mild soft tissue swelling about the left side of the forehead without associated displaced fracture or radiopaque foreign body on this motion degraded maxillofacial CT. 4. 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 5. Moderate to large amount of atherosclerotic plaque within the bilateral carotid bulbs. Further evaluation with nonemergent carotid Doppler ultrasound could be performed as clinically indicated. 6. Mucosal thickening of the bilateral maxillary sinuses, right greater than left. No definitive air-fluid levels   Electronically Signed   By: Simonne ComeJohn  Watts M.D.   On: 03/03/2014 02:24     Code Status: Full Family Communication: Pt at bedside Disposition Plan: Admit for further evaluation     Review of Systems:  Constitutional:  Negative for diaphoresis.  HENT: Negative for hearing loss, ear pain, nosebleeds, congestion, sore throat, neck pain, tinnitus and ear discharge.   Eyes: Negative for blurred vision, double vision, photophobia, pain, discharge and redness.  Respiratory: Negative for cough, hemoptysis, sputum production, wheezing and stridor.   Cardiovascular: Negative for orthopnea, claudication and leg swelling.  Gastrointestinal:  Negative for nausea, vomiting and abdominal pain.  Genitourinary: Negative for dysuria, urgency, frequency, hematuria and flank pain.  Musculoskeletal: Negative for myalgias, back pain, joint pain and falls.  Skin: Negative for itching and rash.  Neurological: Negative forweakness.  Endo/Heme/Allergies: Negative for environmental allergies and polydipsia. Does not bruise/bleed easily.  Psychiatric/Behavioral: Negative for suicidal ideas. The patient is not nervous/anxious.      Past Medical History  Diagnosis Date  . Chronic pain   . Arthritis   . Hypertension   . Chronic pain   . Diabetes mellitus without complication     borderline  . Hyperlipidemia     Past Surgical History  Procedure Laterality Date  . Abdominal surgery    . Hemorroidectomy    . Appendectomy      Social History:  reports that he has been smoking Cigarettes.  He has a 15 pack-year smoking history. He has never used smokeless tobacco. He reports that he drinks alcohol. He reports that he uses illicit drugs (Marijuana).  Allergies  Allergen Reactions  . Tylenol [Acetaminophen] Nausea Only    States can take Tylenol if has other pain med w/it - like Hydrocodone    Family History  Problem Relation Age of Onset  . Hypertension Mother   . Migraines Sister   . Heart failure Brother   . Migraines Brother     Prior to Admission medications   Medication Sig Start Date End Date Taking? Authorizing Provider  acetaminophen (TYLENOL) 500 MG tablet Take 500 mg by mouth every 6 (six) hours as needed for pain.   Yes Historical Provider, MD  cholecalciferol (VITAMIN D) 1000 UNITS tablet Take 1,000 Units by mouth daily.   Yes Historical Provider, MD  folic acid (FOLVITE) 1 MG tablet Take 1 tablet (1 mg total) by mouth daily. 10/08/12  Yes Penny Piarlando Vega, MD  gabapentin (NEURONTIN) 300 MG capsule Take 1 capsule (300 mg total) by mouth 3 (three) times daily. 07/31/12  Yes Arie Sabinaatherine E Schinlever, PA-C   HYDROcodone-acetaminophen [facility])  10-325 MG per tablet Take 1 tablet by mouth every 6 (six) hours as needed for severe pain.   Yes Historical Provider, MD  magnesium oxide (MAG-OX) 400 MG tablet Take 400 mg by mouth daily.   Yes Historical Provider, MD  metoprolol tartrate (LOPRESSOR) 12.5 mg TABS Take 0.5 tablets (12.5 mg total) by mouth 2 (two) times daily. 10/08/12  Yes Penny Pia, MD  omeprazole (PRILOSEC) 20 MG capsule Take 20 mg by mouth daily.   Yes Historical Provider, MD  phenytoin (DILANTIN) 100 MG ER capsule Take 3 capsules (300 mg total) by mouth once. 11/15/13  Yes Brandt Loosen, MD    Physical Exam: Filed Vitals:   03/02/14 1830 03/02/14 2233 03/02/14 2337 03/03/14 0309  BP: 113/71 111/67 159/87 151/85  Pulse: 88 86  87  Temp: 98.2 F (36.8 C) 98 F (36.7 C)  98.2 F (36.8 C)  TempSrc: Oral Oral  Oral  Resp: 20 16 15 18   SpO2: 99% 100% 95% 99%    Physical Exam  Constitutional: Appears well-developed and well-nourished. No distress.  HENT: Normocephalic. External right and left ear normal. Dry MM Eyes: Conjunctivae and EOM are normal. PERRLA, no scleral icterus.  Neck: Normal ROM. Neck supple. No JVD. No tracheal deviation. No thyromegaly.  CVS: RRR, S1/S2 +, no murmurs, no gallops, no carotid bruit.  Pulmonary: Effort and breath sounds normal, no stridor, rhonchi, wheezes, rales.  Abdominal: Soft. BS +,  no distension, tenderness, rebound or guarding.  Musculoskeletal: Normal range of motion.  Lymphadenopathy: No lymphadenopathy noted, cervical, inguinal. Neuro: Alert. Normal reflexes, muscle tone coordination. No cranial nerve deficit. Skin: Skin is warm and dry. No rash noted. Not diaphoretic. No erythema. No pallor.  Psychiatric: Normal mood and affect.  Labs on Admission:  Basic Metabolic Panel:  Recent Labs Lab 03/02/14 1839  NA 144  K 3.7  CL 96  CO2 26  GLUCOSE 87  BUN 22  CREATININE 2.78*  CALCIUM 8.5   Liver Function Tests:  Recent Labs Lab  03/03/14 0034  AST 154*  ALT 38  ALKPHOS 134*  BILITOT 1.2  PROT 8.3  ALBUMIN 3.8   CBC:  Recent Labs Lab 03/02/14 1839  WBC 3.6*  NEUTROABS 1.3*  HGB 12.0*  HCT 33.7*  MCV 98.8  PLT 95*   Cardiac Enzymes:  Recent Labs Lab 03/03/14 0034  TROPONINI <0.30    EKG: Normal sinus rhythm, no ST/T wave changes  Debbora Presto, MD  [facility] Pager [phone]  If 7PM-7AM, please contact night-coverage [email or web address] Password [email or web address] 03/03/2014, 3:19 AM

## 2014-03-03 NOTE — Progress Notes (Addendum)
[facility] PROGRESS NOTE  Dennis Zhang WUJ:[id number] DOB: [birth year] DOA: 03/02/2014 PCP: [name], MD Brief narrative 58 year old male with history of alcohol abuse presenting with be with high alcohol level possibly in the setting of alcohol intoxication.   Assessment/Plan: Acute syncope This is likely in the setting of alcohol intoxication and possible dehydration. Monitor on telemetry. Supportive care with IV fluids. Ultrasound of the carotid done with concern for carotid plaque showing nonsignificant stenosis.  Acute kidney injury with anion gap metabolic acidosis Likely in the setting of dehydration and alcohol use. Renal function is slowly improving. Still has anion gap. Urine drug screen negative. Monitor with IV hydration.  Alcohol abuse with intoxication Patient reports drinking almost 2 packs of beer along with some vodka almost every day and was drinking on the day of admission as well. No signs of withdrawal at this time. Monitor on CIWA. Continue thiamine, folate and multivitamin. Patient wishes to quit alcohol. Social work consult.  Chest pressure on admission Possibly atypical. Resolved now. Serial cardiac enzymes negative  Transaminitis Patient has history of alcoholic cirrhosis. liver ultrasound shows gallbladder sludge without cholecystitis and fatty liver.. Monitor LFTs  Chronic pancreatitis Stable.  ?History of seizures On Dilantin.  check levels  GERD Continue PPI  Pancytopenia Possibly marrow failure with alcohol use. Monitor.  Protein calorie malnutrition Will request nutrition consult.   Diet: Regular  DVT prophylaxis: SCDs  Code Status: Full code Family Communication:  None at bedside Disposition Plan: Home possibly in 1-2 days   Consultants:  None  Procedures:  None  Antibiotics:  None  HPI/Subjective: Patient seen and examined. Denies any pain symptoms at this time.  Objective: Filed Vitals:   03/03/14  0551  BP: 156/97  Pulse: 80  Temp: 98.3 F (36.8 C)  Resp: 18    Intake/Output Summary (Last 24 hours) at 03/03/14 1333 Last data filed at 03/03/14 1014  Gross per 24 hour  Intake 1347.5 ml  Output    650 ml  Net  697.5 ml   Filed Weights   03/03/14 0551  Weight: 61.871 kg (136 lb 6.4 oz)    Exam:   General:  Middle aged male in no acute distress  HEENT: No pallor, moist oral mucosa  Chest: Clear to auscultation bilaterally  Cardiovascular: Normal S1-S2, no murmurs  Abdomen: Soft, nontender, nondistended, bowel sounds present  Musculoskeletal: Warm, no edema  CNS: Alert and oriented, no tremors  Data Reviewed: Basic Metabolic Panel:  Recent Labs Lab 03/02/14 1839 03/03/14 0401  NA 144 143  K 3.7 3.4*  CL 96 95*  CO2 26 24  GLUCOSE 87 92  BUN 22 22  CREATININE 2.78* 1.83*  CALCIUM 8.5 8.2*   Liver Function Tests:  Recent Labs Lab 03/03/14 0034 03/03/14 0401  AST 154* 164*  ALT 38 37  ALKPHOS 134* 130*  BILITOT 1.2 1.1  PROT 8.3 7.9  ALBUMIN 3.8 3.5    Recent Labs Lab 03/03/14 0401  LIPASE 7*   No results found for this basename: AMMONIA,  in the last 168 hours CBC:  Recent Labs Lab 03/02/14 1839 03/03/14 0401  WBC 3.6* 3.0*  NEUTROABS 1.3*  --   HGB 12.0* 12.0*  HCT 33.7* 33.9*  MCV 98.8 99.7  PLT 95* 90*   Cardiac Enzymes:  Recent Labs Lab 03/03/14 0034 03/03/14 0259 03/03/14 0401 03/03/14 0951  CKTOTAL  --   --  294*  --   CKMB  --   --  5.0*  --  TROPONINI <0.30 <0.30 <0.30 <0.30   BNP (last 3 results) No results found for this basename: PROBNP,  in the last 8760 hours CBG: No results found for this basename: GLUCAP,  in the last 168 hours  No results found for this or any previous visit (from the past 240 hour(s)).   Studies: Dg Chest 2 View  03/03/2014   CLINICAL DATA:  Loss of consciousness.  Initial encounter.  EXAM: CHEST  2 VIEW  COMPARISON:  10/04/2012  FINDINGS: Mild limitation due to exclusion of  the posterior costophrenic sulci.  Normal heart size.  Unchanged aortic contours.  Pulmonary hyperinflation; there may be COPD. There is no edema, consolidation, effusion, or pneumothorax. No acute osseous findings.  IMPRESSION: No active cardiopulmonary disease.   Electronically Signed   By: Tiburcio Pea M.D.   On: 03/03/2014 01:45   Dg Thoracic Spine 2 View  03/03/2014   CLINICAL DATA:  Loss of consciousness with fall. Upper back pain. Initial encounter.  EXAM: THORACIC SPINE - 2 VIEW  COMPARISON:  None.  FINDINGS: Lateral imaging of the lower thoracic spine is included on dedicated lumbar radiography. There is no evidence of acute thoracic spine fracture or subluxation. Diffuse degenerative endplate spurring. No bone lesion or endplate erosion identified.  Chronic, calcific pancreatitis.  IMPRESSION: Negative.   Electronically Signed   By: Tiburcio Pea M.D.   On: 03/03/2014 01:47   Dg Lumbar Spine Complete  03/03/2014   CLINICAL DATA:  Patient passed out, now with upper back pain.  EXAM: LUMBAR SPINE - COMPLETE 4+ VIEW  COMPARISON:  CT abdomen pelvis - 10/02/2012  FINDINGS: There are 5 non rib-bearing lumbar type vertebral bodies.  There is a mild scoliotic curvature of the thoracolumbar spine with dominant caudal component convex to the left. There is straightening expected lumbar lordosis. No anterolisthesis or retrolisthesis.  Lumbar vertebral body heights are preserved.  There is mild multilevel lumbar spine DDD, worse at L3-L4 with disc space height loss, endplate irregularity and sclerosis.  Limited visualization the bilateral SI joints is normal.  Ill-defined punctate calcifications overlie the expected location of the pancreatic head inner compatible with the calcific appendage tree attend is demonstrated prior abdominal CT.  Regional bowel gas pattern is normal. Atherosclerotic plaque within the abdominal aorta.  IMPRESSION: 1. No acute findings. 2. Mild multilevel lumbar spine DDD, worse  at L3-L4. 3. Sequela of chronic calcific pancreatitis as demonstrated on remote abdominal CT per   Electronically Signed   By: Simonne Come M.D.   On: 03/03/2014 01:48   Dg Hip Bilateral W/pelvis  03/03/2014   CLINICAL DATA:  Loss of consciousness. Fall. Left hip pain for several months, but recent worsening. Initial encounter.  EXAM: BILATERAL HIP WITH PELVIS - 4+ VIEW  COMPARISON:  10/04/2012 abdominal CT  FINDINGS: There is no evidence of hip fracture dislocation. The pelvic ring is intact.  Bilateral hip osteoarthritis with marginal spurring and subchondral cysts. Joint narrowing is worse on the left where it is moderate to advanced.  IMPRESSION: 1. No acute osseous findings. 2. 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   Electronically Signed   By: Tiburcio Pea M.D.   On: 03/03/2014 01:50   Ct Head Wo Contrast  03/03/2014   CLINICAL DATA:  Syncopal episode, now with abrasion chronic left eyebrow. Initial encounter.  EXAM: CT HEAD WITHOUT CONTRAST  CT MAXILLOFACIAL WITHOUT CONTRAST  CT CERVICAL SPINE WITHOUT CONTRAST  TECHNIQUE: Multidetector CT imaging of the head, cervical spine, and maxillofacial  structures were performed using the standard protocol without intravenous contrast. Multiplanar CT image reconstructions of the cervical spine and maxillofacial structures were also generated.  COMPARISON:  None.  FINDINGS: CT HEAD FINDINGS  There is mild age advanced atrophy with diffuse sulcal prominence centralized volume loss with commensurate ex vacuo dilatation of the ventricular system. Scattered minimal periventricular hypodensities compatible with microvascular ischemic disease. Given extensive background parenchymal abnormalities, there is no CT evidence of acute large territory infarct. No intraparenchymal or extra-axial mass or hemorrhage. Normal size and configuration of the ventricles and basilar cisterns. No midline shift. Intracranial atherosclerosis. Limited visualization the  paranasal sinuses and mastoid air cells are normal.  CT MAXILLOFACIAL FINDINGS  Examination is degraded secondary to patient motion artifact.  There is apparent mild asymmetric soft tissue swelling about the left side of the forehead (image 73, series 9). This finding is without associated radiopaque foreign body or displaced facial fracture.  Normal appearance of the bilateral pterygoid plates.  Normal appearance of the mandible. The bilateral mandibular condyles appear appropriately located.  Normal noncontrast appearance of the bilateral orbits globes. Note normal appearance of the zygomatic arches.  Minimal mucosal thickening within the bilateral maxillary sinuses, right greater than left. The remaining paranasal sinuses and mastoid air cells are normally aerated. No significant nasal septal deviation.  CT CERVICAL SPINE FINDINGS  C1 to the superior endplate of T3 is imaged.  Normal alignment of the cervical spine. No anterolisthesis or retrolisthesis. The dens is normally positioned between the lateral masses of C1. Moderate degenerative change of the atlantodental articulation. Normal atlantoaxial articulations. The bilateral facets are normally aligned. There is apparent at least partial ankylosis of the bilateral transverse facets at C3- C4 and C4-C5.  No fracture or static subluxation of the cervical spine. Cervical vertebral body heights are preserved. Prevertebral soft tissues are normal.  There is moderate severe multilevel DDD throughout the cervical spine, worse at C3-C4, C4-C5, C5-C6 and C6-C7 with disc space height loss, endplate irregularity and small posteriorly directed disc osteophyte complexes at these locations.  Moderate to large amount of atherosclerotic plaque within the bilateral carotid bulbs. No bulky cervical lymphadenopathy on this noncontrast examination. Normal noncontrast appearance of the thyroid gland. 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  IMPRESSION: 1.  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 2. No fracture or static subluxation of the cervical spine. 3. Mild soft tissue swelling about the left side of the forehead without associated displaced fracture or radiopaque foreign body on this motion degraded maxillofacial CT. 4. 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 5. Moderate to large amount of atherosclerotic plaque within the bilateral carotid bulbs. Further evaluation with nonemergent carotid Doppler ultrasound could be performed as clinically indicated. 6. Mucosal thickening of the bilateral maxillary sinuses, right greater than left. No definitive air-fluid levels   Electronically Signed   By: Simonne Come M.D.   On: 03/03/2014 02:24   Ct Cervical Spine Wo Contrast  03/03/2014   CLINICAL DATA:  Syncopal episode, now with abrasion chronic left eyebrow. Initial encounter.  EXAM: CT HEAD WITHOUT CONTRAST  CT MAXILLOFACIAL WITHOUT CONTRAST  CT CERVICAL SPINE WITHOUT CONTRAST  TECHNIQUE: Multidetector CT imaging of the head, cervical spine, and maxillofacial structures were performed using the standard protocol without intravenous contrast. Multiplanar CT image reconstructions of the cervical spine and maxillofacial structures were also generated.  COMPARISON:  None.  FINDINGS: CT HEAD FINDINGS  There is mild age advanced atrophy with diffuse sulcal  prominence centralized volume loss with commensurate ex vacuo dilatation of the ventricular system. Scattered minimal periventricular hypodensities compatible with microvascular ischemic disease. Given extensive background parenchymal abnormalities, there is no CT evidence of acute large territory infarct. No intraparenchymal or extra-axial mass or hemorrhage. Normal size and configuration of the ventricles and basilar cisterns. No midline shift. Intracranial atherosclerosis. Limited visualization the paranasal sinuses and mastoid air cells are normal.  CT  MAXILLOFACIAL FINDINGS  Examination is degraded secondary to patient motion artifact.  There is apparent mild asymmetric soft tissue swelling about the left side of the forehead (image 73, series 9). This finding is without associated radiopaque foreign body or displaced facial fracture.  Normal appearance of the bilateral pterygoid plates.  Normal appearance of the mandible. The bilateral mandibular condyles appear appropriately located.  Normal noncontrast appearance of the bilateral orbits globes. Note normal appearance of the zygomatic arches.  Minimal mucosal thickening within the bilateral maxillary sinuses, right greater than left. The remaining paranasal sinuses and mastoid air cells are normally aerated. No significant nasal septal deviation.  CT CERVICAL SPINE FINDINGS  C1 to the superior endplate of T3 is imaged.  Normal alignment of the cervical spine. No anterolisthesis or retrolisthesis. The dens is normally positioned between the lateral masses of C1. Moderate degenerative change of the atlantodental articulation. Normal atlantoaxial articulations. The bilateral facets are normally aligned. There is apparent at least partial ankylosis of the bilateral transverse facets at C3- C4 and C4-C5.  No fracture or static subluxation of the cervical spine. Cervical vertebral body heights are preserved. Prevertebral soft tissues are normal.  There is moderate severe multilevel DDD throughout the cervical spine, worse at C3-C4, C4-C5, C5-C6 and C6-C7 with disc space height loss, endplate irregularity and small posteriorly directed disc osteophyte complexes at these locations.  Moderate to large amount of atherosclerotic plaque within the bilateral carotid bulbs. No bulky cervical lymphadenopathy on this noncontrast examination. Normal noncontrast appearance of the thyroid gland. 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  IMPRESSION: 1. Atrophy and microvascular ischemic disease without acute  intracranial process. 2. No fracture or static subluxation of the cervical spine. 3. Mild soft tissue swelling about the left side of the forehead without associated displaced fracture or radiopaque foreign body on this motion degraded maxillofacial CT. 4. 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 5. Moderate to large amount of atherosclerotic plaque within the bilateral carotid bulbs. Further evaluation with nonemergent carotid Doppler ultrasound could be performed as clinically indicated. 6. Mucosal thickening of the bilateral maxillary sinuses, right greater than left. No definitive air-fluid levels   Electronically Signed   By: Simonne ComeJohn  Watts M.D.   On: 03/03/2014 02:24   Dg Knee Complete 4 Views Left  03/03/2014   CLINICAL DATA:  Syncope. Hit head on car door. Pain radiating to hips and lower extremity.  EXAM: LEFT KNEE - COMPLETE 4+ VIEW  COMPARISON:  None.  FINDINGS: There is no evidence of fracture, dislocation, or joint effusion. There is no evidence of arthropathy or other focal bone abnormality. Soft tissues are nonsuspicious, mild vascular calcifications.  IMPRESSION: No acute fracture deformity or dislocation.   Electronically Signed   By: Awilda Metroourtnay  Bloomer   On: 03/03/2014 01:49   Ct Maxillofacial Wo Cm  03/03/2014   CLINICAL DATA:  Syncopal episode, now with abrasion chronic left eyebrow. Initial encounter.  EXAM: CT HEAD WITHOUT CONTRAST  CT MAXILLOFACIAL WITHOUT CONTRAST  CT CERVICAL SPINE WITHOUT CONTRAST  TECHNIQUE: Multidetector CT imaging of  the head, cervical spine, and maxillofacial structures were performed using the standard protocol without intravenous contrast. Multiplanar CT image reconstructions of the cervical spine and maxillofacial structures were also generated.  COMPARISON:  None.  FINDINGS: CT HEAD FINDINGS  There is mild age advanced atrophy with diffuse sulcal prominence centralized volume loss with commensurate ex vacuo dilatation of the ventricular  system. Scattered minimal periventricular hypodensities compatible with microvascular ischemic disease. Given extensive background parenchymal abnormalities, there is no CT evidence of acute large territory infarct. No intraparenchymal or extra-axial mass or hemorrhage. Normal size and configuration of the ventricles and basilar cisterns. No midline shift. Intracranial atherosclerosis. Limited visualization the paranasal sinuses and mastoid air cells are normal.  CT MAXILLOFACIAL FINDINGS  Examination is degraded secondary to patient motion artifact.  There is apparent mild asymmetric soft tissue swelling about the left side of the forehead (image 73, series 9). This finding is without associated radiopaque foreign body or displaced facial fracture.  Normal appearance of the bilateral pterygoid plates.  Normal appearance of the mandible. The bilateral mandibular condyles appear appropriately located.  Normal noncontrast appearance of the bilateral orbits globes. Note normal appearance of the zygomatic arches.  Minimal mucosal thickening within the bilateral maxillary sinuses, right greater than left. The remaining paranasal sinuses and mastoid air cells are normally aerated. No significant nasal septal deviation.  CT CERVICAL SPINE FINDINGS  C1 to the superior endplate of T3 is imaged.  Normal alignment of the cervical spine. No anterolisthesis or retrolisthesis. The dens is normally positioned between the lateral masses of C1. Moderate degenerative change of the atlantodental articulation. Normal atlantoaxial articulations. The bilateral facets are normally aligned. There is apparent at least partial ankylosis of the bilateral transverse facets at C3- C4 and C4-C5.  No fracture or static subluxation of the cervical spine. Cervical vertebral body heights are preserved. Prevertebral soft tissues are normal.  There is moderate severe multilevel DDD throughout the cervical spine, worse at C3-C4, C4-C5, C5-C6 and C6-C7  with disc space height loss, endplate irregularity and small posteriorly directed disc osteophyte complexes at these locations.  Moderate to large amount of atherosclerotic plaque within the bilateral carotid bulbs. No bulky cervical lymphadenopathy on this noncontrast examination. Normal noncontrast appearance of the thyroid gland. 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  IMPRESSION: 1. 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 2. No fracture or static subluxation of the cervical spine. 3. Mild soft tissue swelling about the left side of the forehead without associated displaced fracture or radiopaque foreign body on this motion degraded maxillofacial CT. 4. 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 5. Moderate to large amount of atherosclerotic plaque within the bilateral carotid bulbs. Further evaluation with nonemergent carotid Doppler ultrasound could be performed as clinically indicated. 6. Mucosal thickening of the bilateral maxillary sinuses, right greater than left. No definitive air-fluid levels   Electronically Signed   By: Simonne Come M.D.   On: 03/03/2014 02:24   US Abdomen Limited Ruq  03/03/2014   CLINICAL DATA:  Elevated bilirubin.  EXAM: US ABDOMEN LIMITED - RIGHT UPPER QUADRANT  COMPARISON:  10/03/2012.  FINDINGS: Gallbladder:  Non shadowing echogenic material within the dependent gallbladder is consistent with sludge. There was calcified gallstones noted on CT imaging 10/04/2012, which have likely not resolved. No wall thickening or focal tenderness.  Common bile duct:  Diameter: 5 mm.  Liver:  The liver is diffusely dense and heterogeneous in echotexture, consistent with steatosis (also seen on  previous CT imaging). No focal lesion identified. Antegrade flow in the imaged portal venous system.  IMPRESSION: 1. No evidence of biliary obstruction to explain elevated bilirubin. 2. Gallbladder sludge and possible  cholelithiasis. No acute cholecystitis. 3. Hepatic steatosis.   Electronically Signed   By: Tiburcio PeaJonathan  Watts M.D.   On: 03/03/2014 05:01    Scheduled Meds: . folic acid  1 mg Oral Daily  . gabapentin  300 mg Oral TID  . Influenza vac split quadrivalent PF  0.5 mL Intramuscular Tomorrow-1000  . magnesium oxide  400 mg Oral Daily  . metoprolol tartrate  12.5 mg Oral BID  . pantoprazole  40 mg Oral Daily  . pneumococcal 23 valent vaccine  0.5 mL Intramuscular Tomorrow-1000  . sodium chloride  3 mL Intravenous Q12H   Continuous Infusions: . sodium chloride 100 mL/hr at 03/03/14 0751    Active Problems:   Acute renal failure   Alcohol intoxication    Time spent: 25 minutes    Adedamola Seto  [facility] Pager [phone]. If 7PM-7AM, please contact night-coverage at [email or web address], password [email or web address] 03/03/2014, 1:33 PM  LOS: 1 day

## 2014-03-03 NOTE — Progress Notes (Signed)
Pt wants to go outside to smoke & explained to him that we don't allow pt to smoke while hospitalized. Offered an option for Nicotine patch but declined.

## 2014-03-03 NOTE — Progress Notes (Signed)
INITIAL NUTRITION ASSESSMENT  DOCUMENTATION CODES Per approved criteria  -Non-severe (moderate) malnutrition in the context of chronic illness -Underweight  Pt meets criteria for moderate MALNUTRITION in the context of chronic illness as evidenced by moderate muscle and subcutaneous fat depletion.  INTERVENTION:  Provide Ensure Complete po TID, each supplement provides 350 kcal and 13 grams of protein  Encourage PO intake  RD to continue to monitor  NUTRITION DIAGNOSIS: Increased nutrient (protein) needs related to Hx of alcohol abuse as evidenced by BMI <18.5 and estimated nutritional needs.   Goal: Pt to meet >/= 90% of their estimated nutrition needs   Monitor:  PO and supplemental intake, weight, labs, I/O's  Reason for Assessment: Pt identified as at nutrition risk on the Malnutrition Screen Tool  Admitting Dx: syncopal event  ASSESSMENT: 58 yo male with known history of alcohol abuse, [facility] ED with main concern of syncopal event earlier today prior to this admission which was preceded by sudden onset of mid area chest pain, pressure like, lasting several minutes with shortness of breath . Pt reports alcohol use.  PO intake: 50%, pt states that his appetite "comes and goes."  Pt reports 30 lb weight loss which began in January 2015, weight history documentation shows a 13 lb weight loss since June of 2014 (9% wt loss x >1 year), this is insignificant within the time frame. Pt is at nutritional risk d/t Hx of alcohol abuse and underweight status.  Pt reports consuming 2-3 meals/day PTA, sometimes skips breakfast.  Pt would like to receive Ensure with meals, will send TID.   Nutrition Focused Physical Exam:  Subcutaneous Fat:  Orbital Region: WNL Upper Arm Region: moderate depletion Thoracic and Lumbar Region: NA  Muscle:  Temple Region: WNL Clavicle Bone Region: moderate depletion Clavicle and Acromion Bone Region: moderate depletion Scapular Bone  Region: WNL Dorsal Hand: WNL Patellar Region: WNL Anterior Thigh Region: WNL Posterior Calf Region: mild depletion  Edema: no LE edema   Labs reviewed: Low K High Creatinine  Height: Ht Readings from Last 1 Encounters:  03/03/14 6\' 2"  (1.88 m)    Weight: Wt Readings from Last 1 Encounters:  03/03/14 136 lb 6.4 oz (61.871 kg)   Ideal Body Weight: 190 lb  % Ideal Body Weight: 72%  Wt Readings from Last 10 Encounters:  03/03/14 136 lb 6.4 oz (61.871 kg)  11/07/12 149 lb (67.586 kg)  10/25/12 149 lb (67.586 kg)  10/03/12 146 lb 11.2 oz (66.543 kg)    Usual Body Weight: 174 lb  % Usual Body Weight: 78%  BMI:  Body mass index is 17.51 kg/(m^2).  Estimated Nutritional Needs: Kcal: 2200-2300 Protein: 90-100g Fluid: 2.2L/day  Skin: abrasion  Diet Order: General  EDUCATION NEEDS: -No education needs identified at this time   Intake/Output Summary (Last 24 hours) at 03/03/14 1302 Last data filed at 03/03/14 1014  Gross per 24 hour  Intake 1347.5 ml  Output    650 ml  Net  697.5 ml    Last BM: unknown  Labs:   Recent Labs Lab 03/02/14 1839 03/03/14 0401  NA 144 143  K 3.7 3.4*  CL 96 95*  CO2 26 24  BUN 22 22  CREATININE 2.78* 1.83*  CALCIUM 8.5 8.2*  GLUCOSE 87 92    CBG (last 3)  No results found for this basename: GLUCAP,  in the last 72 hours  Scheduled Meds: . folic acid  1 mg Oral Daily  . gabapentin  300 mg  Oral TID  . Influenza vac split quadrivalent PF  0.5 mL Intramuscular Tomorrow-1000  . magnesium oxide  400 mg Oral Daily  . metoprolol tartrate  12.5 mg Oral BID  . pantoprazole  40 mg Oral Daily  . pneumococcal 23 valent vaccine  0.5 mL Intramuscular Tomorrow-1000  . sodium chloride  3 mL Intravenous Q12H    Continuous Infusions: . sodium chloride 100 mL/hr at 03/03/14 0751    Past Medical History  Diagnosis Date  . Chronic pain   . Arthritis   . Hypertension   . Chronic pain   . Diabetes mellitus without  complication     borderline  . Hyperlipidemia     Past Surgical History  Procedure Laterality Date  . Abdominal surgery    . Hemorroidectomy    . Appendectomy      Tilda FrancoLindsey Braniyah Besse, MS, RD, PLDN Provisionally Licensed Dietitian Nutritionist Pager: [phone]

## 2014-03-04 ENCOUNTER — Encounter (HOSPITAL_COMMUNITY): Payer: Self-pay | Admitting: Emergency Medicine

## 2014-03-04 ENCOUNTER — Emergency Department (HOSPITAL_COMMUNITY)
Admission: EM | Admit: 2014-03-04 | Discharge: 2014-03-04 | Disposition: A | Discharge status: Home / Self Care | Payer: Medicaid Other | Attending: Emergency Medicine | Admitting: Emergency Medicine

## 2014-03-04 DIAGNOSIS — R17 Unspecified jaundice: Secondary | ICD-10-CM

## 2014-03-04 DIAGNOSIS — R531 Weakness: Secondary | ICD-10-CM | POA: Diagnosis not present

## 2014-03-04 DIAGNOSIS — Z720 Tobacco use: Secondary | ICD-10-CM | POA: Insufficient documentation

## 2014-03-04 DIAGNOSIS — E119 Type 2 diabetes mellitus without complications: Secondary | ICD-10-CM | POA: Insufficient documentation

## 2014-03-04 DIAGNOSIS — Z79899 Other long term (current) drug therapy: Secondary | ICD-10-CM | POA: Insufficient documentation

## 2014-03-04 DIAGNOSIS — G8929 Other chronic pain: Secondary | ICD-10-CM | POA: Insufficient documentation

## 2014-03-04 DIAGNOSIS — I10 Essential (primary) hypertension: Secondary | ICD-10-CM | POA: Insufficient documentation

## 2014-03-04 DIAGNOSIS — M1990 Unspecified osteoarthritis, unspecified site: Secondary | ICD-10-CM | POA: Insufficient documentation

## 2014-03-04 LAB — CBC WITH DIFFERENTIAL/PLATELET
BASOS ABS: 0 10*3/uL (ref 0.0–0.1)
BASOS PCT: 0 % (ref 0–1)
EOS ABS: 0 10*3/uL (ref 0.0–0.7)
Eosinophils Relative: 0 % (ref 0–5)
HCT: 40.4 % (ref 39.0–52.0)
Hemoglobin: 14 g/dL (ref 13.0–17.0)
Lymphocytes Relative: 30 % (ref 12–46)
Lymphs Abs: 1 10*3/uL (ref 0.7–4.0)
MCH: 35.5 pg — AB (ref 26.0–34.0)
MCHC: 34.7 g/dL (ref 30.0–36.0)
MCV: 102.5 fL — ABNORMAL HIGH (ref 78.0–100.0)
MONOS PCT: 14 % — AB (ref 3–12)
Monocytes Absolute: 0.5 10*3/uL (ref 0.1–1.0)
NEUTROS PCT: 55 % (ref 43–77)
Neutro Abs: 1.9 10*3/uL (ref 1.7–7.7)
PLATELETS: 39 10*3/uL — AB (ref 150–400)
RBC: 3.94 MIL/uL — ABNORMAL LOW (ref 4.22–5.81)
RDW: 13 % (ref 11.5–15.5)
WBC: 3.4 10*3/uL — ABNORMAL LOW (ref 4.0–10.5)

## 2014-03-04 LAB — COMPREHENSIVE METABOLIC PANEL
ALBUMIN: 3.5 g/dL (ref 3.5–5.2)
ALT: 43 U/L (ref 0–53)
ALT: 42 U/L (ref 0–53)
ANION GAP: 14 (ref 5–15)
AST: 195 U/L — AB (ref 0–37)
AST: 177 U/L — ABNORMAL HIGH (ref 0–37)
Albumin: 3.6 g/dL (ref 3.5–5.2)
Alkaline Phosphatase: 149 U/L — ABNORMAL HIGH (ref 39–117)
Alkaline Phosphatase: 147 U/L — ABNORMAL HIGH (ref 39–117)
Anion gap: 15 (ref 5–15)
BUN: 13 mg/dL (ref 6–23)
BUN: 11 mg/dL (ref 6–23)
CALCIUM: 8.8 mg/dL (ref 8.4–10.5)
CO2: 31 mEq/L (ref 19–32)
CO2: 29 mEq/L (ref 19–32)
CREATININE: 1.07 mg/dL (ref 0.50–1.35)
Calcium: 8.7 mg/dL (ref 8.4–10.5)
Chloride: 92 mEq/L — ABNORMAL LOW (ref 96–112)
Chloride: 90 mEq/L — ABNORMAL LOW (ref 96–112)
Creatinine, Ser: 1.04 mg/dL (ref 0.50–1.35)
GFR calc Af Amer: 86 mL/min — ABNORMAL LOW (ref >90)
GFR calc Af Amer: 89 mL/min — ABNORMAL LOW (ref >90)
GFR calc non Af Amer: 77 mL/min — ABNORMAL LOW (ref >90)
GFR, EST NON AFRICAN AMERICAN: 74 mL/min — AB (ref >90)
Glucose, Bld: 172 mg/dL — ABNORMAL HIGH (ref 70–99)
Glucose, Bld: 163 mg/dL — ABNORMAL HIGH (ref 70–99)
POTASSIUM: 3.9 meq/L (ref 3.7–5.3)
Potassium: 3.8 mEq/L (ref 3.7–5.3)
SODIUM: 133 meq/L — AB (ref 137–147)
Sodium: 138 mEq/L (ref 137–147)
TOTAL PROTEIN: 8.4 g/dL — AB (ref 6.0–8.3)
Total Bilirubin: 2.5 mg/dL — ABNORMAL HIGH (ref 0.3–1.2)
Total Bilirubin: 2.9 mg/dL — ABNORMAL HIGH (ref 0.3–1.2)
Total Protein: 8.5 g/dL — ABNORMAL HIGH (ref 6.0–8.3)

## 2014-03-04 LAB — RAPID URINE DRUG SCREEN, HOSP PERFORMED
AMPHETAMINES: NOT DETECTED
Barbiturates: NOT DETECTED
Benzodiazepines: NOT DETECTED
Cocaine: NOT DETECTED
Opiates: POSITIVE — AB
Tetrahydrocannabinol: NOT DETECTED

## 2014-03-04 LAB — CBC
HCT: 38 % — ABNORMAL LOW (ref 39.0–52.0)
Hemoglobin: 13.4 g/dL (ref 13.0–17.0)
MCH: 35.2 pg — AB (ref 26.0–34.0)
MCHC: 35.3 g/dL (ref 30.0–36.0)
MCV: 99.7 fL (ref 78.0–100.0)
PLATELETS: 59 10*3/uL — AB (ref 150–400)
RBC: 3.81 MIL/uL — ABNORMAL LOW (ref 4.22–5.81)
RDW: 13.2 % (ref 11.5–15.5)
WBC: 3 10*3/uL — ABNORMAL LOW (ref 4.0–10.5)

## 2014-03-04 LAB — ETHANOL

## 2014-03-04 MED ORDER — PHENYTOIN SODIUM EXTENDED 100 MG PO CAPS
100.0000 mg | ORAL_CAPSULE | Freq: Three times a day (TID) | ORAL | Status: DC
  Administered 2014-03-04 (×2): 100 mg via ORAL
  Filled 2014-03-04 (×3): qty 1

## 2014-03-04 MED ORDER — HYDROCODONE-ACETAMINOPHEN 10-325 MG PO TABS: 1.0000 | ORAL_TABLET | Freq: Four times a day (QID) | ORAL | Status: DC | PRN

## 2014-03-04 MED ORDER — LORAZEPAM 1 MG PO TABS: 1.0000 mg | ORAL_TABLET | Freq: Four times a day (QID) | ORAL | Status: DC | PRN

## 2014-03-04 MED ORDER — SODIUM CHLORIDE 0.9 % IV BOLUS (SEPSIS)
1000.0000 mL | Freq: Once | INTRAVENOUS | Status: AC
  Administered 2014-03-04: 1000 mL via INTRAVENOUS

## 2014-03-04 MED ORDER — CHLORDIAZEPOXIDE HCL 25 MG PO CAPS
25.0000 mg | ORAL_CAPSULE | Freq: Once | ORAL | Status: AC
Start: 2014-03-04 — End: 2014-03-04
  Administered 2014-03-04: 25 mg via ORAL
  Filled 2014-03-04: qty 1

## 2014-03-04 MED ORDER — THIAMINE HCL 100 MG/ML IJ SOLN
100.0000 mg | Freq: Every day | INTRAMUSCULAR | Status: DC
  Administered 2014-03-04: 100 mg via INTRAVENOUS
  Filled 2014-03-04: qty 2

## 2014-03-04 MED ORDER — CHLORDIAZEPOXIDE HCL 25 MG PO CAPS: 25.0000 mg | ORAL_CAPSULE | Freq: Three times a day (TID) | ORAL | Status: DC | PRN

## 2014-03-04 MED ORDER — LORAZEPAM 1 MG PO TABS: 0.5000 mg | ORAL_TABLET | Freq: Four times a day (QID) | ORAL | Status: DC | PRN

## 2014-03-04 MED ORDER — THIAMINE HCL 100 MG PO TABS: 100.0000 mg | ORAL_TABLET | Freq: Every day | ORAL | Status: DC

## 2014-03-04 NOTE — ED Provider Notes (Signed)
CSN: [id number]     Arrival date & time 03/04/14  1917 History   First MD Initiated Contact with Patient 03/04/14 1923     Chief Complaint  Patient presents with  . Seizures   HPI The patient presents to the emergency room with complaints of an episode of weakness and shaking. The patient was admitted to the hospital yesterday after a syncopal episode associated with alcohol intoxication. The patient was monitored overnight in the hospital. He was feeling better and was released this evening. Patient states he went to the bus stop and while there had an episode of shaking and fell to the ground. Patient states it initially started on his arms and then went down to his legs. He denies loss of consciousness. Patient was responsive on arrival. He did vomit once after the episode. Right now he is denying any trouble with any headache or neck pain. He denies any chest pain or abdominal pain. He denies any focal numbness or weakness. Past Medical History  Diagnosis Date  . Chronic pain   . Arthritis   . Hypertension   . Chronic pain   . Diabetes mellitus without complication     borderline  . Hyperlipidemia    Past Surgical History  Procedure Laterality Date  . Abdominal surgery    . Hemorroidectomy    . Appendectomy     Family History  Problem Relation Age of Onset  . Hypertension Mother   . Migraines Sister   . Heart failure Brother   . Migraines Brother    History  Substance Use Topics  . Smoking status: Current Every Day Smoker -- 0.50 packs/day for 30 years    Types: Cigarettes  . Smokeless tobacco: Never Used  . Alcohol Use: Yes     Comment: occ    Review of Systems  All other systems reviewed and are negative.     Allergies  Tylenol  Home Medications   Prior to Admission medications   Medication Sig Start Date End Date Taking? Authorizing Provider  acetaminophen (TYLENOL) 500 MG tablet Take 1,000 mg by mouth every 6 (six) hours as needed (pain).    Yes  Historical Provider, MD  cholecalciferol (VITAMIN D) 1000 UNITS tablet Take 1,000 Units by mouth daily.   Yes Historical Provider, MD  folic acid (FOLVITE) 1 MG tablet Take 1 tablet (1 mg total) by mouth daily. 10/08/12  Yes Penny Piarlando Vega, MD  gabapentin (NEURONTIN) 300 MG capsule Take 1 capsule (300 mg total) by mouth 3 (three) times daily. 07/31/12  Yes [name], PA-C  HYDROcodone-acetaminophen (NORCO/VICODIN) 5-325 MG per tablet Take 1 tablet by mouth every 6 (six) hours as needed for moderate pain (pain).   Yes Historical Provider, MD  LORazepam (ATIVAN) 1 MG tablet Take 2 mg by mouth every 4 (four) hours as needed for anxiety (anxiety).   Yes Historical Provider, MD  magnesium oxide (MAG-OX) 400 MG tablet Take 400 mg by mouth daily.   Yes Historical Provider, MD  metoprolol tartrate (LOPRESSOR) 25 MG tablet Take 12.5 mg by mouth 2 (two) times daily.   Yes Historical Provider, MD  omeprazole (PRILOSEC) 20 MG capsule Take 20 mg by mouth daily.   Yes Historical Provider, MD  phenytoin (DILANTIN) 100 MG ER capsule Take 100 mg by mouth 3 (three) times daily. Note that Rx at Pharmacy directions are TID but patient states he only takes 1 cap once daily 11/15/13  Yes Brandt LoosenJulie Manly, MD  thiamine 100 MG tablet Take  1 tablet (100 mg total) by mouth daily. 03/04/14  Yes Huey Bienenstockawood Elgergawy, MD  chlordiazePOXIDE (LIBRIUM) 25 MG capsule Take 1 capsule (25 mg total) by mouth 3 (three) times daily as needed for anxiety or withdrawal. 03/04/14   Linwood DibblesJon Cythia Bachtel, MD   BP 147/91  Pulse 68  Temp(Src) 97.7 F (36.5 C) (Oral)  Resp 12  SpO2 100% Physical Exam  Nursing note and vitals reviewed. Constitutional: He appears well-developed and well-nourished. No distress.  HENT:  Head: Normocephalic and atraumatic.  Right Ear: External ear normal.  Left Ear: External ear normal.  Eyes: Conjunctivae are normal. Right eye exhibits no discharge. Left eye exhibits no discharge. No scleral icterus.  Neck: Neck  supple. No tracheal deviation present.  No cervical spine tenderness  Cardiovascular: Normal rate, regular rhythm and intact distal pulses.   Pulmonary/Chest: Effort normal and breath sounds normal. No stridor. No respiratory distress. He has no wheezes. He has no rales.  Abdominal: Soft. Bowel sounds are normal. He exhibits no distension. There is no tenderness. There is no rebound and no guarding.  Musculoskeletal: He exhibits no edema and no tenderness.  Neurological: He is alert. He has normal strength. No cranial nerve deficit (no facial droop, extraocular movements intact, no slurred speech) or sensory deficit. He exhibits normal muscle tone. He displays no seizure activity. Coordination normal.  Skin: Skin is warm and dry. No rash noted.  Psychiatric: He has a normal mood and affect.    ED Course  Procedures (including critical care time) Labs Review Labs Reviewed  CBC WITH DIFFERENTIAL - Abnormal; Notable for the following:    WBC 3.4 (*)    RBC 3.94 (*)    MCV 102.5 (*)    MCH 35.5 (*)    Platelets 39 (*)    Monocytes Relative 14 (*)    All other components within normal limits  COMPREHENSIVE METABOLIC PANEL - Abnormal; Notable for the following:    Sodium 133 (*)    Chloride 90 (*)    Glucose, Bld 163 (*)    Total Protein 8.4 (*)    AST 177 (*)    Alkaline Phosphatase 147 (*)    Total Bilirubin 2.9 (*)    GFR calc non Af Amer 77 (*)    GFR calc Af Amer 89 (*)    All other components within normal limits  URINE RAPID DRUG SCREEN (HOSP PERFORMED) - Abnormal; Notable for the following:    Opiates POSITIVE (*)    All other components within normal limits  ETHANOL    Imaging Review    EKG Interpretation   Date/Time:  Wednesday March 04 2014 20:46:33 EDT Ventricular Rate:  69 PR Interval:  176 QRS Duration: 91 QT Interval:  502 QTC Calculation: 538 R Axis:   -94 Text Interpretation:  Sinus rhythm Inferior infarct, old Anterior infarct,  old Prolonged QT  interval Artifact t wave inversions resolved since last  tracing Confirmed by Ndidi Nesby  MD-J, Jezreel Justiniano ([id number]) on 03/04/2014 8:52:01 PM      MDM   Final diagnoses:  Weakness  Elevated bilirubin    Pt was just released from the hospital today.  He had an extensive workup that did include CT scans, carotid evaluation, liver ultrasound.  Pt's symptoms today could be related to alcohol withdrawal seizure.  He has remained alert in the ED.  No distress.   Able to ambulate without difficulty.  Discussed findings with patient.  He would like to go home.    Cletis AthensJon  Lynelle Doctor, MD 03/04/14 2244

## 2014-03-04 NOTE — ED Notes (Signed)
Bed: [id number] Expected date:  Expected time:  Means of arrival:  Comments: seizure

## 2014-03-04 NOTE — ED Provider Notes (Signed)
Medical screening examination/treatment/procedure(s) were performed by non-physician practitioner and as supervising physician I was immediately available for consultation/collaboration.   EKG Interpretation   Date/Time:  Tuesday March 03 2014 00:22:09 EDT Ventricular Rate:  72 PR Interval:  171 QRS Duration: 96 QT Interval:  455 QTC Calculation: 498 R Axis:   -87 Text Interpretation:  Sinus rhythm Probable left atrial enlargement LAD,  consider left anterior fascicular block Anteroseptal infarct, age  indeterminate T wave inversion V2-V4 Confirmed by Erroll Lunani, Zyia Kaneko Ayokunle  [phone]) on 03/03/2014 12:51:27 AM Also confirmed by Erroll Lunani, Keshawna Dix Ayokunle  [phone]), editor [place]  CCT, BEVERLY ([id number])  on 03/03/2014 7:51:46 AM        Tomasita CrumbleAdeleke Japheth Diekman, MD 03/04/14 1418

## 2014-03-04 NOTE — Discharge Summary (Signed)
Dennis Zhang, 58 y.o., DOB 03/27/1956, MRN [id number]. Admission date: 03/02/2014 Discharge Date 03/04/2014 Primary MD Jyl HeinzARMOUR, ROSS B, MD Admitting Physician Dorothea OgleIskra M Myers, MD  Admission Diagnosis  Syncope and collapse [R55] Alcohol abuse [F10.10] Elevated liver enzymes [R74.8] Elevated bilirubin [R17] Chest pain, unspecified chest pain type [R07.9] Acute renal failure, unspecified acute renal failure type [N17.9]  Discharge Diagnosis   Active Problems:   Acute renal failure   Alcohol intoxication   Increased anion gap metabolic acidosis   Malnutrition of moderate degree      Past Medical History  Diagnosis Date  . Chronic pain   . Arthritis   . Hypertension   . Chronic pain   . Diabetes mellitus without complication     borderline  . Hyperlipidemia     Past Surgical History  Procedure Laterality Date  . Abdominal surgery    . Hemorroidectomy    . Appendectomy     HPI:  Pt is 58 yo male with known history of alcohol abuse, [facility] with main concern of syncopal event earlier today prior to this admission which was preceded by sudden onset of mid area chest pain, pressure like, lasting several minutes with shortness of breath. Pt currently denies chest pain or shortness of breath. Pt reports alcohol use. He also reports similar events in the past. In addition he reports generalized body aches. No fevers, chills, no abd or urinary concerns.    Hospital Course See H&P, Labs, Consult and Test reports for all details in brief, patient was admitted for **  Active Problems:   Acute renal failure   Alcohol intoxication   Increased anion gap metabolic acidosis   Malnutrition of moderate degree  Syncopal event  - this is likley multifactorial in etiology and secondary to alcohol intoxication, dehydration, - Was monitored on telemetry for 48 hours, with no significant events. - Bilateral carotid Doppler done without any evidence of significant stenosis - no  fracture on imaging noted  - Negative cardiac enzymes  Acute alcohol intoxication  -No evidence of DTs or acute withdrawal, will be discharged on thiamine and folic acid, and when necessary Ativan, as needed, patient was instructed not to drive not to rate any machinery while he if he has been taking Ativan.   Chest pressure on admission  Possibly atypical. Resolved now. Serial cardiac enzymes negative  Acute renal failure  - secondary to dehydration  - Resolved  Transaminitis  - secondary to alcohol intoxication  - US abd showing Gallbladder sludge and possible cholelithiasis. No acute  cholecystitis.   Leukopenia and thrombocytopenia  - secondary to bone marrow damage from alcohol use  - no signs of active bleeding     Significant Tests:  See full reports for all details    Dg Chest 2 View  03/03/2014   CLINICAL DATA:  Loss of consciousness.  Initial encounter.  EXAM: CHEST  2 VIEW  COMPARISON:  10/04/2012  FINDINGS: Mild limitation due to exclusion of the posterior costophrenic sulci.  Normal heart size.  Unchanged aortic contours.  Pulmonary hyperinflation; there may be COPD. There is no edema, consolidation, effusion, or pneumothorax. No acute osseous findings.  IMPRESSION: No active cardiopulmonary disease.   Electronically Signed   By: Tiburcio PeaJonathan  Watts M.D.   On: 03/03/2014 01:45   Dg Thoracic Spine 2 View  03/03/2014   CLINICAL DATA:  Loss of consciousness with fall. Upper back pain. Initial encounter.  EXAM: THORACIC SPINE - 2 VIEW  COMPARISON:  None.  FINDINGS: Lateral imaging of the lower thoracic spine is included on dedicated lumbar radiography. There is no evidence of acute thoracic spine fracture or subluxation. Diffuse degenerative endplate spurring. No bone lesion or endplate erosion identified.  Chronic, calcific pancreatitis.  IMPRESSION: Negative.   Electronically Signed   By: Tiburcio Pea M.D.   On: 03/03/2014 01:47   Dg Lumbar Spine Complete  03/03/2014    CLINICAL DATA:  Patient passed out, now with upper back pain.  EXAM: LUMBAR SPINE - COMPLETE 4+ VIEW  COMPARISON:  CT abdomen pelvis - 10/02/2012  FINDINGS: There are 5 non rib-bearing lumbar type vertebral bodies.  There is a mild scoliotic curvature of the thoracolumbar spine with dominant caudal component convex to the left. There is straightening expected lumbar lordosis. No anterolisthesis or retrolisthesis.  Lumbar vertebral body heights are preserved.  There is mild multilevel lumbar spine DDD, worse at L3-L4 with disc space height loss, endplate irregularity and sclerosis.  Limited visualization the bilateral SI joints is normal.  Ill-defined punctate calcifications overlie the expected location of the pancreatic head inner compatible with the calcific appendage tree attend is demonstrated prior abdominal CT.  Regional bowel gas pattern is normal. Atherosclerotic plaque within the abdominal aorta.  IMPRESSION: 1. No acute findings. 2. Mild multilevel lumbar spine DDD, worse at L3-L4. 3. Sequela of chronic calcific pancreatitis as demonstrated on remote abdominal CT per   Electronically Signed   By: Simonne Come M.D.   On: 03/03/2014 01:48   Dg Hip Bilateral W/pelvis  03/03/2014   CLINICAL DATA:  Loss of consciousness. Fall. Left hip pain for several months, but recent worsening. Initial encounter.  EXAM: BILATERAL HIP WITH PELVIS - 4+ VIEW  COMPARISON:  10/04/2012 abdominal CT  FINDINGS: There is no evidence of hip fracture dislocation. The pelvic ring is intact.  Bilateral hip osteoarthritis with marginal spurring and subchondral cysts. Joint narrowing is worse on the left where it is moderate to advanced.  IMPRESSION: 1. No acute osseous findings. 2. 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   Electronically Signed   By: Tiburcio Pea M.D.   On: 03/03/2014 01:50   Ct Head Wo Contrast  03/03/2014   CLINICAL DATA:  Syncopal episode, now with abrasion chronic left eyebrow. Initial  encounter.  EXAM: CT HEAD WITHOUT CONTRAST  CT MAXILLOFACIAL WITHOUT CONTRAST  CT CERVICAL SPINE WITHOUT CONTRAST  TECHNIQUE: Multidetector CT imaging of the head, cervical spine, and maxillofacial structures were performed using the standard protocol without intravenous contrast. Multiplanar CT image reconstructions of the cervical spine and maxillofacial structures were also generated.  COMPARISON:  None.  FINDINGS: CT HEAD FINDINGS  There is mild age advanced atrophy with diffuse sulcal prominence centralized volume loss with commensurate ex vacuo dilatation of the ventricular system. Scattered minimal periventricular hypodensities compatible with microvascular ischemic disease. Given extensive background parenchymal abnormalities, there is no CT evidence of acute large territory infarct. No intraparenchymal or extra-axial mass or hemorrhage. Normal size and configuration of the ventricles and basilar cisterns. No midline shift. Intracranial atherosclerosis. Limited visualization the paranasal sinuses and mastoid air cells are normal.  CT MAXILLOFACIAL FINDINGS  Examination is degraded secondary to patient motion artifact.  There is apparent mild asymmetric soft tissue swelling about the left side of the forehead (image 73, series 9). This finding is without associated radiopaque foreign body or displaced facial fracture.  Normal appearance of the bilateral pterygoid plates.  Normal appearance of the mandible. The bilateral mandibular condyles appear appropriately located.  Normal noncontrast appearance of the bilateral orbits globes. Note normal appearance of the zygomatic arches.  Minimal mucosal thickening within the bilateral maxillary sinuses, right greater than left. The remaining paranasal sinuses and mastoid air cells are normally aerated. No significant nasal septal deviation.  CT CERVICAL SPINE FINDINGS  C1 to the superior endplate of T3 is imaged.  Normal alignment of the cervical spine. No  anterolisthesis or retrolisthesis. The dens is normally positioned between the lateral masses of C1. Moderate degenerative change of the atlantodental articulation. Normal atlantoaxial articulations. The bilateral facets are normally aligned. There is apparent at least partial ankylosis of the bilateral transverse facets at C3- C4 and C4-C5.  No fracture or static subluxation of the cervical spine. Cervical vertebral body heights are preserved. Prevertebral soft tissues are normal.  There is moderate severe multilevel DDD throughout the cervical spine, worse at C3-C4, C4-C5, C5-C6 and C6-C7 with disc space height loss, endplate irregularity and small posteriorly directed disc osteophyte complexes at these locations.  Moderate to large amount of atherosclerotic plaque within the bilateral carotid bulbs. No bulky cervical lymphadenopathy on this noncontrast examination. Normal noncontrast appearance of the thyroid gland. 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  IMPRESSION: 1. 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 2. No fracture or static subluxation of the cervical spine. 3. Mild soft tissue swelling about the left side of the forehead without associated displaced fracture or radiopaque foreign body on this motion degraded maxillofacial CT. 4. 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 5. Moderate to large amount of atherosclerotic plaque within the bilateral carotid bulbs. Further evaluation with nonemergent carotid Doppler ultrasound could be performed as clinically indicated. 6. Mucosal thickening of the bilateral maxillary sinuses, right greater than left. No definitive air-fluid levels   Electronically Signed   By: Simonne Come M.D.   On: 03/03/2014 02:24   Ct Cervical Spine Wo Contrast  03/03/2014   CLINICAL DATA:  Syncopal episode, now with abrasion chronic left eyebrow. Initial encounter.  EXAM: CT HEAD WITHOUT CONTRAST   CT MAXILLOFACIAL WITHOUT CONTRAST  CT CERVICAL SPINE WITHOUT CONTRAST  TECHNIQUE: Multidetector CT imaging of the head, cervical spine, and maxillofacial structures were performed using the standard protocol without intravenous contrast. Multiplanar CT image reconstructions of the cervical spine and maxillofacial structures were also generated.  COMPARISON:  None.  FINDINGS: CT HEAD FINDINGS  There is mild age advanced atrophy with diffuse sulcal prominence centralized volume loss with commensurate ex vacuo dilatation of the ventricular system. Scattered minimal periventricular hypodensities compatible with microvascular ischemic disease. Given extensive background parenchymal abnormalities, there is no CT evidence of acute large territory infarct. No intraparenchymal or extra-axial mass or hemorrhage. Normal size and configuration of the ventricles and basilar cisterns. No midline shift. Intracranial atherosclerosis. Limited visualization the paranasal sinuses and mastoid air cells are normal.  CT MAXILLOFACIAL FINDINGS  Examination is degraded secondary to patient motion artifact.  There is apparent mild asymmetric soft tissue swelling about the left side of the forehead (image 73, series 9). This finding is without associated radiopaque foreign body or displaced facial fracture.  Normal appearance of the bilateral pterygoid plates.  Normal appearance of the mandible. The bilateral mandibular condyles appear appropriately located.  Normal noncontrast appearance of the bilateral orbits globes. Note normal appearance of the zygomatic arches.  Minimal mucosal thickening within the bilateral maxillary sinuses, right greater than left. The remaining paranasal sinuses and mastoid air cells are normally aerated. No significant nasal septal  deviation.  CT CERVICAL SPINE FINDINGS  C1 to the superior endplate of T3 is imaged.  Normal alignment of the cervical spine. No anterolisthesis or retrolisthesis. The dens is normally  positioned between the lateral masses of C1. Moderate degenerative change of the atlantodental articulation. Normal atlantoaxial articulations. The bilateral facets are normally aligned. There is apparent at least partial ankylosis of the bilateral transverse facets at C3- C4 and C4-C5.  No fracture or static subluxation of the cervical spine. Cervical vertebral body heights are preserved. Prevertebral soft tissues are normal.  There is moderate severe multilevel DDD throughout the cervical spine, worse at C3-C4, C4-C5, C5-C6 and C6-C7 with disc space height loss, endplate irregularity and small posteriorly directed disc osteophyte complexes at these locations.  Moderate to large amount of atherosclerotic plaque within the bilateral carotid bulbs. No bulky cervical lymphadenopathy on this noncontrast examination. Normal noncontrast appearance of the thyroid gland. 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  IMPRESSION: 1. 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 2. No fracture or static subluxation of the cervical spine. 3. Mild soft tissue swelling about the left side of the forehead without associated displaced fracture or radiopaque foreign body on this motion degraded maxillofacial CT. 4. 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 5. Moderate to large amount of atherosclerotic plaque within the bilateral carotid bulbs. Further evaluation with nonemergent carotid Doppler ultrasound could be performed as clinically indicated. 6. Mucosal thickening of the bilateral maxillary sinuses, right greater than left. No definitive air-fluid levels   Electronically Signed   By: Simonne Come M.D.   On: 03/03/2014 02:24   Dg Knee Complete 4 Views Left  03/03/2014   CLINICAL DATA:  Syncope. Hit head on car door. Pain radiating to hips and lower extremity.  EXAM: LEFT KNEE - COMPLETE 4+ VIEW  COMPARISON:  None.  FINDINGS: There is no evidence of  fracture, dislocation, or joint effusion. There is no evidence of arthropathy or other focal bone abnormality. Soft tissues are nonsuspicious, mild vascular calcifications.  IMPRESSION: No acute fracture deformity or dislocation.   Electronically Signed   By: Awilda Metro   On: 03/03/2014 01:49   Ct Maxillofacial Wo Cm  03/03/2014   CLINICAL DATA:  Syncopal episode, now with abrasion chronic left eyebrow. Initial encounter.  EXAM: CT HEAD WITHOUT CONTRAST  CT MAXILLOFACIAL WITHOUT CONTRAST  CT CERVICAL SPINE WITHOUT CONTRAST  TECHNIQUE: Multidetector CT imaging of the head, cervical spine, and maxillofacial structures were performed using the standard protocol without intravenous contrast. Multiplanar CT image reconstructions of the cervical spine and maxillofacial structures were also generated.  COMPARISON:  None.  FINDINGS: CT HEAD FINDINGS  There is mild age advanced atrophy with diffuse sulcal prominence centralized volume loss with commensurate ex vacuo dilatation of the ventricular system. Scattered minimal periventricular hypodensities compatible with microvascular ischemic disease. Given extensive background parenchymal abnormalities, there is no CT evidence of acute large territory infarct. No intraparenchymal or extra-axial mass or hemorrhage. Normal size and configuration of the ventricles and basilar cisterns. No midline shift. Intracranial atherosclerosis. Limited visualization the paranasal sinuses and mastoid air cells are normal.  CT MAXILLOFACIAL FINDINGS  Examination is degraded secondary to patient motion artifact.  There is apparent mild asymmetric soft tissue swelling about the left side of the forehead (image 73, series 9). This finding is without associated radiopaque foreign body or displaced facial fracture.  Normal appearance of the bilateral pterygoid plates.  Normal appearance of the mandible. The bilateral  mandibular condyles appear appropriately located.  Normal noncontrast  appearance of the bilateral orbits globes. Note normal appearance of the zygomatic arches.  Minimal mucosal thickening within the bilateral maxillary sinuses, right greater than left. The remaining paranasal sinuses and mastoid air cells are normally aerated. No significant nasal septal deviation.  CT CERVICAL SPINE FINDINGS  C1 to the superior endplate of T3 is imaged.  Normal alignment of the cervical spine. No anterolisthesis or retrolisthesis. The dens is normally positioned between the lateral masses of C1. Moderate degenerative change of the atlantodental articulation. Normal atlantoaxial articulations. The bilateral facets are normally aligned. There is apparent at least partial ankylosis of the bilateral transverse facets at C3- C4 and C4-C5.  No fracture or static subluxation of the cervical spine. Cervical vertebral body heights are preserved. Prevertebral soft tissues are normal.  There is moderate severe multilevel DDD throughout the cervical spine, worse at C3-C4, C4-C5, C5-C6 and C6-C7 with disc space height loss, endplate irregularity and small posteriorly directed disc osteophyte complexes at these locations.  Moderate to large amount of atherosclerotic plaque within the bilateral carotid bulbs. No bulky cervical lymphadenopathy on this noncontrast examination. Normal noncontrast appearance of the thyroid gland. 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  IMPRESSION: 1. 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 2. No fracture or static subluxation of the cervical spine. 3. Mild soft tissue swelling about the left side of the forehead without associated displaced fracture or radiopaque foreign body on this motion degraded maxillofacial CT. 4. 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 5. Moderate to large amount of atherosclerotic plaque within the bilateral carotid bulbs. Further evaluation with nonemergent carotid Doppler  ultrasound could be performed as clinically indicated. 6. Mucosal thickening of the bilateral maxillary sinuses, right greater than left. No definitive air-fluid levels   Electronically Signed   By: Simonne ComeJohn  Watts M.D.   On: 03/03/2014 02:24   [place] Abdomen Limited Ruq  03/03/2014   CLINICAL DATA:  Elevated bilirubin.  EXAM: US ABDOMEN LIMITED - RIGHT UPPER QUADRANT  COMPARISON:  10/03/2012.  FINDINGS: Gallbladder:  Non shadowing echogenic material within the dependent gallbladder is consistent with sludge. There was calcified gallstones noted on CT imaging 10/04/2012, which have likely not resolved. No wall thickening or focal tenderness.  Common bile duct:  Diameter: 5 mm.  Liver:  The liver is diffusely dense and heterogeneous in echotexture, consistent with steatosis (also seen on previous CT imaging). No focal lesion identified. Antegrade flow in the imaged portal venous system.  IMPRESSION: 1. No evidence of biliary obstruction to explain elevated bilirubin. 2. Gallbladder sludge and possible cholelithiasis. No acute cholecystitis. 3. Hepatic steatosis.   Electronically Signed   By: Tiburcio PeaJonathan  Watts M.D.   On: 03/03/2014 05:01     Today   Subjective:   Dennis Zhang today has no headache,no chest abdominal pain,no new weakness tingling or numbness, feels much better wants to go home today.   Objective:   Blood pressure 149/100, pulse 69, temperature 97.6 F (36.4 C), temperature source Oral, resp. rate 18, height 6\' 2"  (1.88 m), weight 61.871 kg (136 lb 6.4 oz), SpO2 100.00%.  Intake/Output Summary (Last 24 hours) at 03/04/14 1405 Last data filed at 03/04/14 0935  Gross per 24 hour  Intake 2178.75 ml  Output   3575 ml  Net -1396.25 ml    Exam Awake Alert, Oriented *3, No new F.N deficits, Normal affect Short.AT,PERRAL Supple Neck,No JVD, No cervical lymphadenopathy appriciated.  Symmetrical  Chest wall movement, Good air movement bilaterally, CTAB RRR,No Gallops,Rubs or new Murmurs, No  Parasternal Heave +ve B.Sounds, Abd Soft, Non tender, No organomegaly appriciated, No rebound -guarding or rigidity. No Cyanosis, Clubbing or edema, No new Rash or bruise  Data Review   Cultures -   CBC w Diff: Lab Results  Component Value Date   WBC 3.0* 03/04/2014   HGB 13.4 03/04/2014   HCT 38.0* 03/04/2014   PLT 59* 03/04/2014   LYMPHOPCT 50* 03/02/2014   MONOPCT 13* 03/02/2014   EOSPCT 0 03/02/2014   BASOPCT 0 03/02/2014   CMP: Lab Results  Component Value Date   NA 138 03/04/2014   K 3.8 03/04/2014   CL 92* 03/04/2014   CO2 31 03/04/2014   BUN 13 03/04/2014   CREATININE 1.07 03/04/2014   PROT 8.5* 03/04/2014   ALBUMIN 3.6 03/04/2014   BILITOT 2.5* 03/04/2014   ALKPHOS 149* 03/04/2014   AST 195* 03/04/2014   ALT 43 03/04/2014  .  Micro Results No results found for this or any previous visit (from the past 240 hour(s)).   Discharge Instructions      Follow-up Information   Follow up with ARMOUR, ROSS B, MD In 5 days.   Specialty:  Internal Medicine   Contact information:   [address] [place] [address] [phone]       Discharge Medications     Medication List         acetaminophen 500 MG tablet  Commonly known as:  TYLENOL  Take 500 mg by mouth every 6 (six) hours as needed for pain.     cholecalciferol 1000 UNITS tablet  Commonly known as:  VITAMIN D  Take 1,000 Units by mouth daily.     folic acid 1 MG tablet  Commonly known as:  FOLVITE  Take 1 tablet (1 mg total) by mouth daily.     gabapentin 300 MG capsule  Commonly known as:  NEURONTIN  Take 1 capsule (300 mg total) by mouth 3 (three) times daily.     HYDROcodone-acetaminophen 10-325 MG per tablet  Commonly known as:  NORCO  Take 1 tablet by mouth every 6 (six) hours as needed for severe pain.     LORazepam 1 MG tablet  Commonly known as:  ATIVAN  Take 1 tablet (1 mg total) by mouth every 6 (six) hours as needed (withdrawal symptoms:anxiety, agitation, insomnia,  diaphoresis, nausea, vomiting, tremors, tachycardia, or hypertension.).     magnesium oxide 400 MG tablet  Commonly known as:  MAG-OX  Take 400 mg by mouth daily.     metoprolol tartrate 12.5 mg Tabs tablet  Commonly known as:  LOPRESSOR  Take 0.5 tablets (12.5 mg total) by mouth 2 (two) times daily.     omeprazole 20 MG capsule  Commonly known as:  PRILOSEC  Take 20 mg by mouth daily.     phenytoin 100 MG ER capsule  Commonly known as:  DILANTIN  Take 100 mg by mouth 3 (three) times daily. Note that Rx at Pharmacy directions are TID but patient states he only takes 1 cap once daily     thiamine 100 MG tablet  Take 1 tablet (100 mg total) by mouth daily.         Total Time in preparing paper work, data evaluation and todays exam - 35 minutes  Skye Rodarte M.D on 03/04/2014 at 2:05 PM  [facility] Office  [phone]

## 2014-03-04 NOTE — ED Notes (Signed)
Per [facility] pt was found [place] the [place] following a seizure and unwitnessed fall. Pt was responsive, GCS 15. [facility] states pt vomited at scene and smelled of ETOH.

## 2014-03-04 NOTE — Discharge Instructions (Signed)
Follow with Primary MD ARMOUR, ROSS B, MD in 5 days   Get CBC, CMP, 2 view Chest X ray checked  by Primary MD next visit.    Activity: As tolerated with Full fall precautions use walker/cane & assistance as needed   Disposition Home   Diet: Heart Healthy , with feeding assistance and aspiration precautions as needed.  For Heart failure patients - Check your Weight same time everyday, if you gain over 2 pounds, or you develop in leg swelling, experience more shortness of breath or chest pain, call your Primary MD immediately. Follow Cardiac Low Salt Diet and 1.8 lit/day fluid restriction.   On your next visit with her primary care physician please Get Medicines reviewed and adjusted.  Please request your Prim.MD to go over all Hospital Tests and Procedure/Radiological results at the follow up, please get all Hospital records sent to your Prim MD by signing hospital release before you go home.   If you experience worsening of your admission symptoms, develop shortness of breath, life threatening emergency, suicidal or homicidal thoughts you must seek medical attention immediately by calling 911 or calling your MD immediately  if symptoms less severe.  You Must read complete instructions/literature along with all the possible adverse reactions/side effects for all the Medicines you take and that have been prescribed to you. Take any new Medicines after you have completely understood and accpet all the possible adverse reactions/side effects.   Do not drive, operating heavy machinery, perform activities at heights, swimming or participation in water activities or provide baby sitting services if your were admitted for syncope or siezures until you have seen by Primary MD or a Neurologist and advised to do so again.  Do not drive when taking Pain medications.    Do not take more than prescribed Pain, Sleep and Anxiety Medications  Special Instructions: If you have smoked or chewed Tobacco   in the last 2 yrs please stop smoking, stop any regular Alcohol  and or any Recreational drug use.  Wear Seat belts while driving.   Please note  You were cared for by a hospitalist during your hospital stay. If you have any questions about your discharge medications or the care you received while you were in the hospital after you are discharged, you can call the unit and asked to speak with the hospitalist on call if the hospitalist that took care of you is not available. Once you are discharged, your primary care physician will handle any further medical issues. Please note that NO REFILLS for any discharge medications will be authorized once you are discharged, as it is imperative that you return to your primary care physician (or establish a relationship with a primary care physician if you do not have one) for your aftercare needs so that they can reassess your need for medications and monitor your lab values.

## 2014-03-04 NOTE — Discharge Instructions (Signed)
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
## 2014-03-04 NOTE — Progress Notes (Signed)
Patient had 5 beat run of V tach. Patient sleeping. NP made aware. No new orders given.

## 2014-05-20 ENCOUNTER — Emergency Department (HOSPITAL_COMMUNITY): Payer: Medicaid Other

## 2014-05-20 ENCOUNTER — Encounter (HOSPITAL_COMMUNITY): Payer: Self-pay | Admitting: *Deleted

## 2014-05-20 ENCOUNTER — Inpatient Hospital Stay (HOSPITAL_COMMUNITY): Payer: Medicaid Other

## 2014-05-20 ENCOUNTER — Inpatient Hospital Stay (HOSPITAL_COMMUNITY)
Admission: EM | Admit: 2014-05-20 | Discharge: 2014-05-22 | DRG: 682 | Disposition: A | Discharge status: Home / Self Care | Payer: Medicaid Other | Attending: Internal Medicine | Admitting: Internal Medicine

## 2014-05-20 DIAGNOSIS — I951 Orthostatic hypotension: Secondary | ICD-10-CM | POA: Diagnosis present

## 2014-05-20 DIAGNOSIS — G8929 Other chronic pain: Secondary | ICD-10-CM | POA: Diagnosis present

## 2014-05-20 DIAGNOSIS — D539 Nutritional anemia, unspecified: Secondary | ICD-10-CM | POA: Diagnosis present

## 2014-05-20 DIAGNOSIS — D696 Thrombocytopenia, unspecified: Secondary | ICD-10-CM | POA: Diagnosis present

## 2014-05-20 DIAGNOSIS — I10 Essential (primary) hypertension: Secondary | ICD-10-CM | POA: Diagnosis present

## 2014-05-20 DIAGNOSIS — R740 Nonspecific elevation of levels of transaminase and lactic acid dehydrogenase [LDH]: Secondary | ICD-10-CM

## 2014-05-20 DIAGNOSIS — E860 Dehydration: Secondary | ICD-10-CM | POA: Diagnosis present

## 2014-05-20 DIAGNOSIS — N179 Acute kidney failure, unspecified: Principal | ICD-10-CM | POA: Diagnosis present

## 2014-05-20 DIAGNOSIS — E119 Type 2 diabetes mellitus without complications: Secondary | ICD-10-CM | POA: Diagnosis not present

## 2014-05-20 DIAGNOSIS — R531 Weakness: Secondary | ICD-10-CM

## 2014-05-20 DIAGNOSIS — K7010 Alcoholic hepatitis without ascites: Secondary | ICD-10-CM | POA: Diagnosis present

## 2014-05-20 DIAGNOSIS — R110 Nausea: Secondary | ICD-10-CM

## 2014-05-20 DIAGNOSIS — M549 Dorsalgia, unspecified: Secondary | ICD-10-CM | POA: Diagnosis present

## 2014-05-20 DIAGNOSIS — E43 Unspecified severe protein-calorie malnutrition: Secondary | ICD-10-CM | POA: Insufficient documentation

## 2014-05-20 DIAGNOSIS — M1990 Unspecified osteoarthritis, unspecified site: Secondary | ICD-10-CM | POA: Diagnosis present

## 2014-05-20 DIAGNOSIS — E785 Hyperlipidemia, unspecified: Secondary | ICD-10-CM | POA: Diagnosis present

## 2014-05-20 DIAGNOSIS — F17210 Nicotine dependence, cigarettes, uncomplicated: Secondary | ICD-10-CM | POA: Diagnosis present

## 2014-05-20 DIAGNOSIS — G40909 Epilepsy, unspecified, not intractable, without status epilepticus: Secondary | ICD-10-CM | POA: Diagnosis present

## 2014-05-20 DIAGNOSIS — Z681 Body mass index (BMI) 19 or less, adult: Secondary | ICD-10-CM | POA: Diagnosis not present

## 2014-05-20 DIAGNOSIS — R7401 Elevation of levels of liver transaminase levels: Secondary | ICD-10-CM | POA: Diagnosis present

## 2014-05-20 DIAGNOSIS — K7460 Unspecified cirrhosis of liver: Secondary | ICD-10-CM | POA: Diagnosis present

## 2014-05-20 DIAGNOSIS — F1010 Alcohol abuse, uncomplicated: Secondary | ICD-10-CM | POA: Diagnosis present

## 2014-05-20 DIAGNOSIS — R4182 Altered mental status, unspecified: Secondary | ICD-10-CM | POA: Diagnosis present

## 2014-05-20 DIAGNOSIS — Z720 Tobacco use: Secondary | ICD-10-CM | POA: Diagnosis not present

## 2014-05-20 DIAGNOSIS — R55 Syncope and collapse: Secondary | ICD-10-CM

## 2014-05-20 HISTORY — DX: Unspecified convulsions: R56.9

## 2014-05-20 LAB — COMPREHENSIVE METABOLIC PANEL
ALK PHOS: 115 U/L (ref 39–117)
ALT: 20 U/L (ref 0–53)
AST: 74 U/L — ABNORMAL HIGH (ref 0–37)
Albumin: 3.8 g/dL (ref 3.5–5.2)
Anion gap: 9 (ref 5–15)
BUN: 32 mg/dL — ABNORMAL HIGH (ref 6–23)
CHLORIDE: 98 meq/L (ref 96–112)
CO2: 30 mmol/L (ref 19–32)
Calcium: 8.1 mg/dL — ABNORMAL LOW (ref 8.4–10.5)
Creatinine, Ser: 3.3 mg/dL — ABNORMAL HIGH (ref 0.50–1.35)
GFR calc Af Amer: 22 mL/min — ABNORMAL LOW (ref >90)
GFR calc non Af Amer: 19 mL/min — ABNORMAL LOW (ref >90)
Glucose, Bld: 103 mg/dL — ABNORMAL HIGH (ref 70–99)
Potassium: 3.4 mmol/L — ABNORMAL LOW (ref 3.5–5.1)
Sodium: 137 mmol/L (ref 135–145)
Total Bilirubin: 2.4 mg/dL — ABNORMAL HIGH (ref 0.3–1.2)
Total Protein: 7.9 g/dL (ref 6.0–8.3)

## 2014-05-20 LAB — CBC WITH DIFFERENTIAL/PLATELET
BASOS PCT: 0 % (ref 0–1)
Basophils Absolute: 0 10*3/uL (ref 0.0–0.1)
EOS ABS: 0 10*3/uL (ref 0.0–0.7)
Eosinophils Relative: 0 % (ref 0–5)
HEMATOCRIT: 39.5 % (ref 39.0–52.0)
HEMOGLOBIN: 13.6 g/dL (ref 13.0–17.0)
Lymphocytes Relative: 38 % (ref 12–46)
Lymphs Abs: 2 10*3/uL (ref 0.7–4.0)
MCH: 35.4 pg — ABNORMAL HIGH (ref 26.0–34.0)
MCHC: 34.4 g/dL (ref 30.0–36.0)
MCV: 102.9 fL — ABNORMAL HIGH (ref 78.0–100.0)
MONO ABS: 0.6 10*3/uL (ref 0.1–1.0)
Monocytes Relative: 11 % (ref 3–12)
NEUTROS ABS: 2.7 10*3/uL (ref 1.7–7.7)
Neutrophils Relative %: 51 % (ref 43–77)
Platelets: 38 10*3/uL — ABNORMAL LOW (ref 150–400)
RBC: 3.84 MIL/uL — ABNORMAL LOW (ref 4.22–5.81)
RDW: 13.4 % (ref 11.5–15.5)
WBC: 5.3 10*3/uL (ref 4.0–10.5)

## 2014-05-20 LAB — CBG MONITORING, ED: Glucose-Capillary: 95 mg/dL (ref 70–99)

## 2014-05-20 LAB — ETHANOL: Alcohol, Ethyl (B): <5 mg/dL (ref 0–9)

## 2014-05-20 LAB — RAPID URINE DRUG SCREEN, HOSP PERFORMED
Amphetamines: NOT DETECTED
Barbiturates: NOT DETECTED
Benzodiazepines: NOT DETECTED
Cocaine: NOT DETECTED
Opiates: POSITIVE — AB
TETRAHYDROCANNABINOL: NOT DETECTED

## 2014-05-20 LAB — TROPONIN I: TROPONIN I: 0.03 ng/mL (ref <0.031)

## 2014-05-20 LAB — MAGNESIUM: Magnesium: 1.2 mg/dL — ABNORMAL LOW (ref 1.5–2.5)

## 2014-05-20 LAB — LIPASE, BLOOD: Lipase: 19 U/L (ref 11–59)

## 2014-05-20 MED ORDER — VITAMIN B-1 100 MG PO TABS
100.0000 mg | ORAL_TABLET | Freq: Every day | ORAL | Status: DC
  Administered 2014-05-20 – 2014-05-22 (×3): 100 mg via ORAL
  Filled 2014-05-20 (×3): qty 1

## 2014-05-20 MED ORDER — FOLIC ACID 1 MG PO TABS
1.0000 mg | ORAL_TABLET | Freq: Every day | ORAL | Status: DC
  Administered 2014-05-20 – 2014-05-22 (×3): 1 mg via ORAL
  Filled 2014-05-20 (×3): qty 1

## 2014-05-20 MED ORDER — LORAZEPAM 2 MG/ML IJ SOLN
1.0000 mg | Freq: Four times a day (QID) | INTRAMUSCULAR | Status: DC | PRN
  Administered 2014-05-21: 1 mg via INTRAVENOUS
  Filled 2014-05-20: qty 1

## 2014-05-20 MED ORDER — SODIUM CHLORIDE 0.9 % IV SOLN
INTRAVENOUS | Status: DC
  Administered 2014-05-20 – 2014-05-21 (×2): via INTRAVENOUS

## 2014-05-20 MED ORDER — PHENYTOIN SODIUM EXTENDED 100 MG PO CAPS
100.0000 mg | ORAL_CAPSULE | Freq: Two times a day (BID) | ORAL | Status: DC
  Administered 2014-05-21: 100 mg via ORAL
  Filled 2014-05-20 (×3): qty 1

## 2014-05-20 MED ORDER — SODIUM CHLORIDE 0.9 % IV BOLUS (SEPSIS)
1000.0000 mL | Freq: Once | INTRAVENOUS | Status: AC
  Administered 2014-05-20: 1000 mL via INTRAVENOUS

## 2014-05-20 MED ORDER — ONDANSETRON HCL 4 MG PO TABS: 4.0000 mg | ORAL_TABLET | Freq: Four times a day (QID) | ORAL | Status: DC | PRN

## 2014-05-20 MED ORDER — SODIUM CHLORIDE 0.9 % IV BOLUS (SEPSIS): 1000.0000 mL | Freq: Once | INTRAVENOUS | Status: DC

## 2014-05-20 MED ORDER — HEPARIN SODIUM (PORCINE) 5000 UNIT/ML IJ SOLN: 5000.0000 [IU] | Freq: Three times a day (TID) | INTRAMUSCULAR | Status: DC

## 2014-05-20 MED ORDER — ONDANSETRON HCL 4 MG/2ML IJ SOLN: 4.0000 mg | Freq: Four times a day (QID) | INTRAMUSCULAR | Status: DC | PRN

## 2014-05-20 MED ORDER — THIAMINE HCL 100 MG/ML IJ SOLN
100.0000 mg | Freq: Every day | INTRAMUSCULAR | Status: DC
  Filled 2014-05-20 (×3): qty 1

## 2014-05-20 MED ORDER — ADULT MULTIVITAMIN W/MINERALS CH
1.0000 | ORAL_TABLET | Freq: Every day | ORAL | Status: DC
  Administered 2014-05-20 – 2014-05-22 (×3): 1 via ORAL
  Filled 2014-05-20 (×3): qty 1

## 2014-05-20 MED ORDER — LORAZEPAM 1 MG PO TABS
1.0000 mg | ORAL_TABLET | Freq: Four times a day (QID) | ORAL | Status: DC | PRN
  Administered 2014-05-22: 1 mg via ORAL
  Filled 2014-05-20: qty 1

## 2014-05-20 MED ORDER — HYDROCODONE-ACETAMINOPHEN 5-325 MG PO TABS
1.0000 | ORAL_TABLET | Freq: Four times a day (QID) | ORAL | Status: DC | PRN
Start: 2014-05-20 — End: 2014-05-22
  Administered 2014-05-20 – 2014-05-21 (×3): 1 via ORAL
  Filled 2014-05-20 (×3): qty 1

## 2014-05-20 MED ORDER — SODIUM CHLORIDE 0.9 % IJ SOLN
3.0000 mL | Freq: Two times a day (BID) | INTRAMUSCULAR | Status: DC
  Administered 2014-05-21: 3 mL via INTRAVENOUS

## 2014-05-20 MED ORDER — ENSURE COMPLETE PO LIQD
237.0000 mL | Freq: Two times a day (BID) | ORAL | Status: DC
  Administered 2014-05-21 – 2014-05-22 (×2): 237 mL via ORAL

## 2014-05-20 MED ORDER — ALBUTEROL SULFATE (2.5 MG/3ML) 0.083% IN NEBU: 2.5000 mg | INHALATION_SOLUTION | RESPIRATORY_TRACT | Status: DC | PRN

## 2014-05-20 MED ORDER — NICOTINE 14 MG/24HR TD PT24
14.0000 mg | MEDICATED_PATCH | Freq: Every day | TRANSDERMAL | Status: DC
  Administered 2014-05-20 – 2014-05-22 (×3): 14 mg via TRANSDERMAL
  Filled 2014-05-20 (×3): qty 1

## 2014-05-20 MED ORDER — GABAPENTIN 300 MG PO CAPS
300.0000 mg | ORAL_CAPSULE | Freq: Two times a day (BID) | ORAL | Status: DC
  Administered 2014-05-20 – 2014-05-22 (×4): 300 mg via ORAL
  Filled 2014-05-20 (×5): qty 1

## 2014-05-20 MED ORDER — HYDROCODONE-ACETAMINOPHEN 5-325 MG PO TABS
2.0000 | ORAL_TABLET | Freq: Once | ORAL | Status: AC
  Administered 2014-05-20: 2 via ORAL
  Filled 2014-05-20: qty 2

## 2014-05-20 NOTE — Progress Notes (Signed)
Utilization Review completed.  Cornelious Diven RN CM  

## 2014-05-20 NOTE — H&P (Addendum)
[facility] History and Physical  Laszlo Ellerby KZL:[id number] DOB: 1955-07-24 DOA: 05/20/2014   PCP: Garnett Farm, MD  Specialists: None  Chief Complaint: Dizziness, weakness  HPI: Dennis Zhang is a 58 y.o. male with a past medical history of alcohol abuse, arthritis causing chronic pain, hypertension, who has been hospitalized previously for similar issues with alcohol related dehydration. He tells me that he quit drinking alcohol about 5 days ago. And then about 2 days ago he started having shaking episodes, nausea, vomiting, episodes of diarrhea. And then he started feeling dizzy, especially when he would get up from a sitting or lying position. Denies any fever. Has had some chills. Denies any passing out spells. He thinks he may have had a seizure, though he is not sure. He subsequently decided to come in to the hospital for further evaluation. Not very good historian. He also has upper abdominal pain. And has chronic left-sided hip, right knee and back pain.  Home Medications: Prior to Admission medications   Medication Sig Start Date End Date Taking? Authorizing Provider  acetaminophen (TYLENOL) 500 MG tablet Take 1,000 mg by mouth every 6 (six) hours as needed (pain).    Yes Historical Provider, MD  chlordiazePOXIDE (LIBRIUM) 25 MG capsule Take 1 capsule (25 mg total) by mouth 3 (three) times daily as needed for anxiety or withdrawal. 03/04/14  Yes Dorie Rank, MD  cholecalciferol (VITAMIN D) 1000 UNITS tablet Take 1,000 Units by mouth daily.   Yes Historical Provider, MD  folic acid (FOLVITE) 1 MG tablet Take 1 tablet (1 mg total) by mouth daily. 10/08/12  Yes Velvet Bathe, MD  gabapentin (NEURONTIN) 300 MG capsule Take 1 capsule (300 mg total) by mouth 3 (three) times daily. Patient taking differently: Take 300 mg by mouth 2 (two) times daily.  07/31/12  Yes [name], PA-C  HYDROcodone-acetaminophen (NORCO/VICODIN) 5-325 MG per tablet Take 1 tablet by mouth  every 6 (six) hours as needed for moderate pain (pain).   Yes Historical Provider, MD  LORazepam (ATIVAN) 1 MG tablet Take 2 mg by mouth every 4 (four) hours as needed for anxiety (anxiety).   Yes Historical Provider, MD  magnesium oxide (MAG-OX) 400 MG tablet Take 400 mg by mouth daily.   Yes Historical Provider, MD  metoprolol tartrate (LOPRESSOR) 25 MG tablet Take 12.5 mg by mouth 2 (two) times daily.   Yes Historical Provider, MD  omeprazole (PRILOSEC) 20 MG capsule Take 20 mg by mouth daily.   Yes Historical Provider, MD  thiamine 100 MG tablet Take 1 tablet (100 mg total) by mouth daily. 03/04/14  Yes Dawood Elgergawy, MD  phenytoin (DILANTIN) 100 MG ER capsule Take 100 mg by mouth 2 (two) times daily. Note that Rx at Pharmacy directions are TID but patient states he only takes 1 cap once daily 11/15/13   Elyn Peers, MD    Allergies:  Allergies  Allergen Reactions  . Tylenol [Acetaminophen] Nausea Only    States can take Tylenol if has other pain med w/it - like Hydrocodone    Past Medical History: Past Medical History  Diagnosis Date  . Chronic pain   . Arthritis   . Hypertension   . Chronic pain   . Diabetes mellitus without complication     borderline  . Hyperlipidemia     Past Surgical History  Procedure Laterality Date  . Abdominal surgery    . Hemorroidectomy    . Appendectomy      Social History: He lives  in [place]. Smokes 10 cigarettes on a daily basis. Used to drink beer and walker regularly, but he quit 5 days ago. Unable to quantify. Usually independent with daily activities.  Family History:  Family History  Problem Relation Age of Onset  . Hypertension Mother   . Migraines Sister   . Heart failure Brother   . Migraines Brother      Review of Systems - History obtained from the patient General ROS: positive for  - fatigue Psychological ROS: negative Ophthalmic ROS: negative ENT ROS: negative Allergy and Immunology ROS: negative Hematological  and Lymphatic ROS: negative Endocrine ROS: negative Respiratory ROS: no cough, shortness of breath, or wheezing Cardiovascular ROS: no chest pain or dyspnea on exertion Gastrointestinal ROS: as in hpi Genito-Urinary ROS: decrease quantity of urine Musculoskeletal ROS: positive for - joint pain Neurological ROS: no TIA or stroke symptoms Dermatological ROS: negative  Physical Examination  Filed Vitals:   05/20/14 1432 05/20/14 1500 05/20/14 1530 05/20/14 1600  BP: 107/41 103/74 93/59 107/72  Pulse: 84 77 71 44  Temp: 98.3 F (36.8 C)     TempSrc: Oral     Resp: 20     SpO2: 100% 98% 100% 85%    BP 107/72 mmHg  Pulse 44  Temp(Src) 98.3 F (36.8 C) (Oral)  Resp 20  SpO2 85%  General appearance: alert, cooperative, appears stated age and no distress Head: Normocephalic, without obvious abnormality, atraumatic Eyes: conjunctivae/corneas clear. PERRL, EOM's intact. Throat: lips, mucosa, and tongue normal; teeth and gums normal Neck: no adenopathy, no carotid bruit, no JVD, supple, symmetrical, trachea midline and thyroid not enlarged, symmetric, no tenderness/mass/nodules Resp: clear to auscultation bilaterally Cardio: regular rate and rhythm, S1, S2 normal, no murmur, click, rub or gallop GI: Soft. Tender in the epigastric area without any rebound, rigidity or guarding. No masses or organomegaly. BS present. Extremities: extremities normal, atraumatic, no cyanosis or edema Pulses: 2+ and symmetric Skin: Skin color, texture, turgor normal. No rashes or lesions Lymph nodes: Cervical, supraclavicular, and axillary nodes normal. Neurologic: Alert and oriented 3. Cranial nerve II-12 intact. Motor strength equal bilateral upper and lower extremities. Gait not assessed.  Laboratory Data: Results for orders placed or performed during the hospital encounter of 05/20/14 (from the past 48 hour(s))  CBG monitoring, ED     Status: None   Collection Time: 05/20/14 10:33 AM  Result Value  Ref Range   Glucose-Capillary 95 70 - 99 mg/dL  CBC with Differential     Status: Abnormal (Preliminary result)   Collection Time: 05/20/14 12:24 PM  Result Value Ref Range   WBC 5.3 4.0 - 10.5 K/uL   RBC 3.84 (L) 4.22 - 5.81 MIL/uL   Hemoglobin 13.6 13.0 - 17.0 g/dL   HCT 39.5 39.0 - 52.0 %   MCV 102.9 (H) 78.0 - 100.0 fL   MCH 35.4 (H) 26.0 - 34.0 pg   MCHC 34.4 30.0 - 36.0 g/dL   RDW 13.4 11.5 - 15.5 %   Platelets PENDING 150 - 400 K/uL   Neutrophils Relative % PENDING 43 - 77 %   Neutro Abs PENDING 1.7 - 7.7 K/uL   Band Neutrophils PENDING 0 - 10 %   Lymphocytes Relative PENDING 12 - 46 %   Lymphs Abs PENDING 0.7 - 4.0 K/uL   Monocytes Relative PENDING 3 - 12 %   Monocytes Absolute PENDING 0.1 - 1.0 K/uL   Eosinophils Relative PENDING 0 - 5 %   Eosinophils Absolute PENDING 0.0 - 0.7  K/uL   Basophils Relative PENDING 0 - 1 %   Basophils Absolute PENDING 0.0 - 0.1 K/uL   WBC Morphology PENDING    RBC Morphology PENDING    Smear Review PENDING    nRBC PENDING 0 /100 WBC   Metamyelocytes Relative PENDING %   Myelocytes PENDING %   Promyelocytes Absolute PENDING %   Blasts PENDING %  Ethanol     Status: None   Collection Time: 05/20/14  1:59 PM  Result Value Ref Range   Alcohol, Ethyl (B) <5 0 - 9 mg/dL    Comment:        LOWEST DETECTABLE LIMIT FOR SERUM ALCOHOL IS 11 mg/dL FOR MEDICAL PURPOSES ONLY   Comprehensive metabolic panel     Status: Abnormal   Collection Time: 05/20/14  1:59 PM  Result Value Ref Range   Sodium 137 135 - 145 mmol/L    Comment: Please note change in reference range.   Potassium 3.4 (L) 3.5 - 5.1 mmol/L    Comment: Please note change in reference range.   Chloride 98 96 - 112 mEq/L   CO2 30 19 - 32 mmol/L   Glucose, Bld 103 (H) 70 - 99 mg/dL   BUN 32 (H) 6 - 23 mg/dL   Creatinine, Ser 3.30 (H) 0.50 - 1.35 mg/dL   Calcium 8.1 (L) 8.4 - 10.5 mg/dL   Total Protein 7.9 6.0 - 8.3 g/dL   Albumin 3.8 3.5 - 5.2 g/dL   AST 74 (H) 0 - 37 U/L    ALT 20 0 - 53 U/L   Alkaline Phosphatase 115 39 - 117 U/L   Total Bilirubin 2.4 (H) 0.3 - 1.2 mg/dL   GFR calc non Af Amer 19 (L) >90 mL/min   GFR calc Af Amer 22 (L) >90 mL/min    Comment: (NOTE) The eGFR has been calculated using the CKD EPI equation. This calculation has not been validated in all clinical situations. eGFR's persistently <90 mL/min signify possible Chronic Kidney Disease.    Anion gap 9 5 - 15  Lipase, blood     Status: None   Collection Time: 05/20/14  1:59 PM  Result Value Ref Range   Lipase 19 11 - 59 U/L  Troponin I     Status: None   Collection Time: 05/20/14  1:59 PM  Result Value Ref Range   Troponin I 0.03 <0.031 ng/mL    Comment:        NO INDICATION OF MYOCARDIAL INJURY. Please note change in reference range.     Radiology Reports: Dg Chest Port 1 View  05/20/2014   CLINICAL DATA:  Dizziness since this morning.  EXAM: PORTABLE CHEST - 1 VIEW  COMPARISON:  03/03/2014  FINDINGS: There is hyperinflation of the lungs compatible with COPD. Scarring in the left base and apices. Heart is normal size. No confluent opacities or effusions.  IMPRESSION: COPD/chronic changes.  No active disease.   Electronically Signed   By: Rolm Baptise M.D.   On: 05/20/2014 13:24   Dg Abd 2 Views  05/20/2014   CLINICAL DATA:  Nausea.  EXAM: ABDOMEN - 2 VIEW  COMPARISON:  None.  FINDINGS: The bowel gas pattern is normal. There is no evidence of free air. No radio-opaque calculi or other significant radiographic abnormality is seen. Degenerative change of both hip joints is noted.  IMPRESSION: No evidence of bowel obstruction or ileus.   Electronically Signed   By: Sabino Dick M.D.   On: 05/20/2014  14:14    Electrocardiogram: Sinus rhythm at 96 bpm. Normal axis. QT interval was noted to be prolonged. Nonspecific T wave changes. No concerning ST changes. Similar to previous EKG  Problem List  Principal Problem:   Acute kidney injury Active Problems:   Orthostatic  hypotension   Alcohol abuse   Transaminitis   Assessment: This is a 58 year old African-American male with a past medical history as stated earlier, who presents with near syncopal episodes. He is noted to be profoundly dehydrated and has acute renal failure. He has upper abdominal pain which is most likely due to his episodes of nausea, vomiting and possibly gastritis. His lipase is normal. He was noted to be orthostatic in the emergency department.  Plan: #1 acute renal failure with orthostatic hypotension: He'll be aggressively hydrated. Check renal ultrasound. Check UA. Monitor urine output closely. This should improve with hydration.  #2 History of alcohol abuse/Possible alcoholic Liver disease/thrombocytopenia: Consult Education officer, museum. He will placed on the CIWA protocol. Check magnesium level. AST is slightly elevated. Repeat in the morning. Place him on thiamine, folic acid. Platelet levels have been low in the past as well. No bleeding.  #3 Chronic pain due to arthritis: Continue with his home medications.  #4 history of essential hypertension: Hold his metoprolol due to low blood pressure.  #5 history of seizure disorder: He is noted to be on Dilantin. We'll check a Dilantin level. Continue with his home dosage.   DVT Prophylaxis: SCD's Code Status: Full code.  Family Communication: Discussed with the patient Disposition Plan: Admit to telemetry   Further management decisions will depend on results of further testing and patient's response to treatment.   [facility]  [facility] Pager [phone]  If 7PM-7AM, please contact night-coverage [email or web address] Password [email or web address]  05/20/2014, 4:53 PM

## 2014-05-20 NOTE — ED Provider Notes (Signed)
CSN: [id number]     Arrival date & time 05/20/14  1022 History   First MD Initiated Contact with Patient 05/20/14 1110     Chief Complaint  Patient presents with  . Dizziness     (Consider location/radiation/quality/duration/timing/severity/associated sxs/prior Treatment) HPI Dennis Zhang is a 58 year old male with past medical history chronic pain, arthritis, hypertension, diabetes, hyperlipidemia who presents the ER complaining of dizziness, weakness. Patient reports having approximately one week of generalized malaise, with yesterday morning waking up with onset of dizziness with position change, with associated diaphoresis. Patient reports an episode of near syncope yesterday afternoon, and has since had dizziness with sitting up or standing up. Patient states his dizziness improves with lying flat. Patient reports associated nausea and vomiting 4 with his dizziness. Patient states he has had similar signs and symptoms of the past after stopping alcohol. He states he stopped drinking alcohol approximately 7 days ago. Patient states he has had poor by mouth intake for the past several days as well. Patient also complaining of chronic back and side pain.  Past Medical History  Diagnosis Date  . Chronic pain   . Arthritis   . Hypertension   . Chronic pain   . Diabetes mellitus without complication     borderline  . Hyperlipidemia    Past Surgical History  Procedure Laterality Date  . Abdominal surgery    . Hemorroidectomy    . Appendectomy     Family History  Problem Relation Age of Onset  . Hypertension Mother   . Migraines Sister   . Heart failure Brother   . Migraines Brother    History  Substance Use Topics  . Smoking status: Current Every Day Smoker -- 0.50 packs/day for 30 years    Types: Cigarettes  . Smokeless tobacco: Never Used  . Alcohol Use: Yes     Comment: occ    Review of Systems  Constitutional: Negative for fever.  HENT: Negative for trouble  swallowing.   Eyes: Negative for visual disturbance.  Respiratory: Negative for shortness of breath.   Cardiovascular: Negative for chest pain.  Gastrointestinal: Positive for nausea, vomiting and diarrhea. Negative for abdominal pain.  Genitourinary: Negative for dysuria.  Musculoskeletal: Negative for neck pain.  Skin: Negative for rash.  Neurological: Positive for dizziness and weakness. Negative for numbness and headaches.  Psychiatric/Behavioral: Negative.       Allergies  Tylenol  Home Medications   Prior to Admission medications   Medication Sig Start Date End Date Taking? Authorizing Provider  acetaminophen (TYLENOL) 500 MG tablet Take 1,000 mg by mouth every 6 (six) hours as needed (pain).    Yes Historical Provider, MD  chlordiazePOXIDE (LIBRIUM) 25 MG capsule Take 1 capsule (25 mg total) by mouth 3 (three) times daily as needed for anxiety or withdrawal. 03/04/14  Yes Linwood Dibbles, MD  cholecalciferol (VITAMIN D) 1000 UNITS tablet Take 1,000 Units by mouth daily.   Yes Historical Provider, MD  folic acid (FOLVITE) 1 MG tablet Take 1 tablet (1 mg total) by mouth daily. 10/08/12  Yes Penny Pia, MD  gabapentin (NEURONTIN) 300 MG capsule Take 1 capsule (300 mg total) by mouth 3 (three) times daily. Patient taking differently: Take 300 mg by mouth 2 (two) times daily.  07/31/12  Yes [name], PA-C  HYDROcodone-acetaminophen (NORCO/VICODIN) 5-325 MG per tablet Take 1 tablet by mouth every 6 (six) hours as needed for moderate pain (pain).   Yes Historical Provider, MD  LORazepam (ATIVAN) 1 MG tablet  Take 2 mg by mouth every 4 (four) hours as needed for anxiety (anxiety).   Yes Historical Provider, MD  magnesium oxide (MAG-OX) 400 MG tablet Take 400 mg by mouth daily.   Yes Historical Provider, MD  metoprolol tartrate (LOPRESSOR) 25 MG tablet Take 12.5 mg by mouth 2 (two) times daily.   Yes Historical Provider, MD  omeprazole (PRILOSEC) 20 MG capsule Take 20 mg by mouth  daily.   Yes Historical Provider, MD  thiamine 100 MG tablet Take 1 tablet (100 mg total) by mouth daily. 03/04/14  Yes Dawood Elgergawy, MD  phenytoin (DILANTIN) 100 MG ER capsule Take 100 mg by mouth 2 (two) times daily. Note that Rx at Pharmacy directions are TID but patient states he only takes 1 cap once daily 11/15/13   Brandt LoosenJulie Manly, MD   BP 107/72 mmHg  Pulse 44  Temp(Src) 98.3 F (36.8 C) (Oral)  Resp 20  SpO2 85% Physical Exam  Constitutional: He is oriented to person, place, and time. He appears well-developed and well-nourished. No distress.  HENT:  Head: Normocephalic and atraumatic.  Mouth/Throat: Oropharynx is clear and moist. No oropharyngeal exudate.  Eyes: Right eye exhibits no discharge. Left eye exhibits no discharge. No scleral icterus.  Neck: Normal range of motion.  Cardiovascular: Regular rhythm and normal heart sounds.  Tachycardia present.   No murmur heard. Pulses:      Radial pulses are 2+ on the right side, and 2+ on the left side.  Heart rate 110 on exam.  Pulmonary/Chest: Effort normal and breath sounds normal. No respiratory distress.  Abdominal: Soft. There is no tenderness.  Musculoskeletal: Normal range of motion. He exhibits no edema or tenderness.  Neurological: He is alert and oriented to person, place, and time. No cranial nerve deficit. Coordination normal.  Skin: Skin is warm and dry. No rash noted. He is not diaphoretic.  Psychiatric: He has a normal mood and affect.  Nursing note and vitals reviewed.   ED Course  Procedures (including critical care time) Labs Review Labs Reviewed  CBC WITH DIFFERENTIAL - Abnormal; Notable for the following:    RBC 3.84 (*)    MCV 102.9 (*)    MCH 35.4 (*)    Platelets 38 (*)    All other components within normal limits  COMPREHENSIVE METABOLIC PANEL - Abnormal; Notable for the following:    Potassium 3.4 (*)    Glucose, Bld 103 (*)    BUN 32 (*)    Creatinine, Ser 3.30 (*)    Calcium 8.1 (*)     AST 74 (*)    Total Bilirubin 2.4 (*)    GFR calc non Af Amer 19 (*)    GFR calc Af Amer 22 (*)    All other components within normal limits  ETHANOL  LIPASE, BLOOD  TROPONIN I  URINALYSIS, ROUTINE W REFLEX MICROSCOPIC  URINE RAPID DRUG SCREEN (HOSP PERFORMED)  CBG MONITORING, ED    Imaging Review Dg Chest Port 1 View  05/20/2014   CLINICAL DATA:  Dizziness since this morning.  EXAM: PORTABLE CHEST - 1 VIEW  COMPARISON:  03/03/2014  FINDINGS: There is hyperinflation of the lungs compatible with COPD. Scarring in the left base and apices. Heart is normal size. No confluent opacities or effusions.  IMPRESSION: COPD/chronic changes.  No active disease.   Electronically Signed   By: Charlett NoseKevin  Dover M.D.   On: 05/20/2014 13:24   Dg Abd 2 Views  05/20/2014   CLINICAL DATA:  Nausea.  EXAM: ABDOMEN - 2 VIEW  COMPARISON:  None.  FINDINGS: The bowel gas pattern is normal. There is no evidence of free air. No radio-opaque calculi or other significant radiographic abnormality is seen. Degenerative change of both hip joints is noted.  IMPRESSION: No evidence of bowel obstruction or ileus.   Electronically Signed   By: Roque LiasJames  Green M.D.   On: 05/20/2014 14:14     EKG Interpretation None      MDM   Final diagnoses:  Syncope  Weakness  Nausea  Acute kidney injury   Patient here with generalized weakness, dizziness when standing since yesterday. Patient states he stopped drinking alcohol a week ago, and has felt mild malaise since. Patient states he has had x-rays withdrawals in the past, is having some similar symptoms today. Patient appears dehydrated on exam, we'll treat symptomatically, follow-up with labs. On testing orthostatic hypotension, with patient going from a supine to a sitting position, blood pressure dropped to 49 systolic with pulse to 140. We'll continue IV fluid boluses to maintain blood pressure and help patient's symptoms. Patient complaining of mild back and side pain which she  states chronic pain, patient treated with hydrocodone.   Recent labs unremarkable for leukocytosis, anemia, which led abnormality. Patient does have elevated renal function significant for acute kidney injury. BUN is 32 with creatinine 3.30 as compared to creatinine of 1.2 in October of this year. We'll continue fluid management, and admit patient for acute kidney injury. Patient's radiographs unremarkable for acute pathology. On reexam, patient is not tachypneic, non-tachycardic and afebrile, no evidence of SIRS. The patient appears reasonably stabilized for admission considering the current resources, flow, and capabilities available in the ED at this time, and I doubt any other [facility] requiring further screening and/or treatment in the ED prior to admission.  BP 107/72 mmHg  Pulse 44  Temp(Src) 98.3 F (36.8 C) (Oral)  Resp 20  SpO2 85%  Signed,  Ladona MowJoe Xanthe Couillard, PA-C 5:06 PM  Patient seen and discussed with Dr. Azalia BilisKevin Campos, M.D.   Monte FantasiaJoseph W Kacy Conely, PA-C 05/20/14 1707  Lyanne CoKevin M Campos, MD 05/21/14 [phone]

## 2014-05-20 NOTE — ED Notes (Signed)
Bed: WA07 Expected date:  Expected time:  Means of arrival:  Comments: EMS vertigo

## 2014-05-20 NOTE — ED Notes (Signed)
Per EMS pt from home with c/o dizziness/vertigo since this am. +orthostatic changes. Denies pain. CBG 163. Hx of alcohol abuse, reports one week without alcohol and c/o some shaking. Unable to obtain IV access.

## 2014-05-21 DIAGNOSIS — F10231 Alcohol dependence with withdrawal delirium: Secondary | ICD-10-CM

## 2014-05-21 DIAGNOSIS — E43 Unspecified severe protein-calorie malnutrition: Secondary | ICD-10-CM | POA: Insufficient documentation

## 2014-05-21 DIAGNOSIS — D696 Thrombocytopenia, unspecified: Secondary | ICD-10-CM

## 2014-05-21 LAB — URINALYSIS, ROUTINE W REFLEX MICROSCOPIC
Glucose, UA: NEGATIVE mg/dL
HGB URINE DIPSTICK: NEGATIVE
Ketones, ur: NEGATIVE mg/dL
Nitrite: NEGATIVE
PROTEIN: 30 mg/dL — AB
Specific Gravity, Urine: 1.021 (ref 1.005–1.030)
UROBILINOGEN UA: 1 mg/dL (ref 0.0–1.0)
pH: 5 (ref 5.0–8.0)

## 2014-05-21 LAB — COMPREHENSIVE METABOLIC PANEL
ALK PHOS: 106 U/L (ref 39–117)
ALT: 19 U/L (ref 0–53)
ANION GAP: 10 (ref 5–15)
AST: 74 U/L — ABNORMAL HIGH (ref 0–37)
Albumin: 3.2 g/dL — ABNORMAL LOW (ref 3.5–5.2)
BILIRUBIN TOTAL: 1.7 mg/dL — AB (ref 0.3–1.2)
BUN: 32 mg/dL — ABNORMAL HIGH (ref 6–23)
CALCIUM: 7.9 mg/dL — AB (ref 8.4–10.5)
CO2: 24 mmol/L (ref 19–32)
Chloride: 101 mEq/L (ref 96–112)
Creatinine, Ser: 2.29 mg/dL — ABNORMAL HIGH (ref 0.50–1.35)
GFR calc non Af Amer: 30 mL/min — ABNORMAL LOW (ref >90)
GFR, EST AFRICAN AMERICAN: 34 mL/min — AB (ref >90)
Glucose, Bld: 107 mg/dL — ABNORMAL HIGH (ref 70–99)
Potassium: 3.5 mmol/L (ref 3.5–5.1)
SODIUM: 135 mmol/L (ref 135–145)
TOTAL PROTEIN: 6.5 g/dL (ref 6.0–8.3)

## 2014-05-21 LAB — CBC
HCT: 33.4 % — ABNORMAL LOW (ref 39.0–52.0)
Hemoglobin: 11.7 g/dL — ABNORMAL LOW (ref 13.0–17.0)
MCH: 36 pg — ABNORMAL HIGH (ref 26.0–34.0)
MCHC: 35 g/dL (ref 30.0–36.0)
MCV: 102.8 fL — AB (ref 78.0–100.0)
PLATELETS: 35 10*3/uL — AB (ref 150–400)
RBC: 3.25 MIL/uL — ABNORMAL LOW (ref 4.22–5.81)
RDW: 13.3 % (ref 11.5–15.5)
WBC: 4.4 10*3/uL (ref 4.0–10.5)

## 2014-05-21 LAB — URINE MICROSCOPIC-ADD ON

## 2014-05-21 LAB — PROTIME-INR
INR: 1.24 (ref 0.00–1.49)
Prothrombin Time: 15.8 seconds — ABNORMAL HIGH (ref 11.6–15.2)

## 2014-05-21 LAB — VITAMIN B12: Vitamin B-12: 925 pg/mL — ABNORMAL HIGH (ref 211–911)

## 2014-05-21 LAB — PHENYTOIN LEVEL, TOTAL: Phenytoin Lvl: <2.5 ug/mL — ABNORMAL LOW (ref 10.0–20.0)

## 2014-05-21 MED ORDER — LORAZEPAM 2 MG/ML IJ SOLN
1.0000 mg | INTRAMUSCULAR | Status: DC | PRN
  Administered 2014-05-21 – 2014-05-22 (×6): 1 mg via INTRAVENOUS
  Filled 2014-05-21 (×6): qty 1

## 2014-05-21 MED ORDER — LEVETIRACETAM 500 MG PO TABS
500.0000 mg | ORAL_TABLET | Freq: Two times a day (BID) | ORAL | Status: DC
  Administered 2014-05-21 – 2014-05-22 (×3): 500 mg via ORAL
  Filled 2014-05-21 (×4): qty 1

## 2014-05-21 MED ORDER — PNEUMOCOCCAL VAC POLYVALENT 25 MCG/0.5ML IJ INJ
0.5000 mL | INJECTION | INTRAMUSCULAR | Status: AC
  Administered 2014-05-22: 0.5 mL via INTRAMUSCULAR
  Filled 2014-05-21 (×2): qty 0.5

## 2014-05-21 MED ORDER — INFLUENZA VAC SPLIT QUAD 0.5 ML IM SUSY
0.5000 mL | PREFILLED_SYRINGE | INTRAMUSCULAR | Status: AC
  Administered 2014-05-22: 0.5 mL via INTRAMUSCULAR
  Filled 2014-05-21 (×2): qty 0.5

## 2014-05-21 NOTE — Progress Notes (Addendum)
[facility] Progress Note   Dennis Zhang ZOX:[id number] DOB: 07/11/1955 DOA: 05/20/2014 PCP: Jyl HeinzARMOUR, ROSS B, MD  Brief narrative: Dennis MillinLevonda Zhang is a 58 y.o. male with a history of alcohol abuse, arthritis with chronic pain and hypertension. The patient presented with nausea vomiting and diarrhea after stopping alcohol 5 days ago. He was lightheaded as well. He was found to be in acute renal failure and therefore admitted   Subjective: Feels well. He was not urinating yesterday but is urinating well today. No complaints of nausea vomiting or diarrhea. He is eating his breakfast and having no problems with this. No further episodes of lightheadedness.  Assessment/Plan: Principal Problem:   Acute kidney injury -Possibly prerenal in relation to fluid losses from vomiting and diarrhea -Improving with hydration-continue to hydrate with IV fluids and follow  Active Problems:   Orthostatic hypotension -Continue to hydrate-symptoms improving    Alcohol abuse -No further symptoms of alcohol withdrawal -He states that he does not plan on drinking again    Transaminitis -Likely acute alcoholic hepatitis versus cirrhosis    Thrombocytopenia -Likely secondary to alcohol abuse  Macrocytic anemia -Normal vitamin B-12 -Check Folic acid   Code Status: Full code Family Communication:  Disposition Plan: Home when stable DVT prophylaxis: Avoid blood thinners due to severe thrombocytopenia-SCDs ordered  Consultants: None  Procedures: None  Antibiotics: Anti-infectives    None         Objective: Filed Weights   05/20/14 1745 05/21/14 0618  Weight: 62.2 kg (137 lb 2 oz) 56.473 kg (124 lb 8 oz)    Intake/Output Summary (Last 24 hours) at 05/21/14 1032 Last data filed at 05/21/14 0653  Gross per 24 hour  Intake   1690 ml  Output    300 ml  Net   1390 ml     Vitals Filed Vitals:   05/20/14 2056 05/21/14 0041 05/21/14 0605 05/21/14 0618  BP: 99/61 136/86  134/90   Pulse: 86  72   Temp: 98.1 F (36.7 C) 97.8 F (36.6 C) 98.1 F (36.7 C)   TempSrc: Oral Oral Oral   Resp: 20 20 20    Height:      Weight:    56.473 kg (124 lb 8 oz)  SpO2:  96% 100%     Exam: General: Awake alert oriented 3, No acute respiratory distress Lungs: Clear to auscultation bilaterally without wheezes or crackles Cardiovascular: Regular rate and rhythm without murmur gallop or rub normal S1 and S2 Abdomen: Nontender, nondistended, soft, bowel sounds positive, no rebound, no ascites, no appreciable mass Extremities: No significant cyanosis, clubbing, or edema bilateral lower extremities  Data Reviewed: Basic Metabolic Panel:  Recent Labs Lab 05/20/14 1359 05/21/14 0445  NA 137 135  K 3.4* 3.5  CL 98 101  CO2 30 24  GLUCOSE 103* 107*  BUN 32* 32*  CREATININE 3.30* 2.29*  CALCIUM 8.1* 7.9*  MG 1.2*  --    Liver Function Tests:  Recent Labs Lab 05/20/14 1359 05/21/14 0445  AST 74* 74*  ALT 20 19  ALKPHOS 115 106  BILITOT 2.4* 1.7*  PROT 7.9 6.5  ALBUMIN 3.8 3.2*    Recent Labs Lab 05/20/14 1359  LIPASE 19   No results for input(s): AMMONIA in the last 168 hours. CBC:  Recent Labs Lab 05/20/14 1224 05/21/14 0445  WBC 5.3 4.4  NEUTROABS 2.7  --   HGB 13.6 11.7*  HCT 39.5 33.4*  MCV 102.9* 102.8*  PLT 38* 35*   Cardiac Enzymes:  Recent Labs Lab 05/20/14 1359  TROPONINI 0.03   BNP (last 3 results) No results for input(s): PROBNP in the last 8760 hours. CBG:  Recent Labs Lab 05/20/14 1033  GLUCAP 95    No results found for this or any previous visit (from the past 240 hour(s)).   Studies:  Recent x-ray studies have been reviewed in detail by the Attending Physician  Scheduled Meds:  Scheduled Meds: . feeding supplement (ENSURE COMPLETE)  237 mL Oral BID BM  . folic acid  1 mg Oral Daily  . gabapentin  300 mg Oral BID  . [START ON 05/22/2014] Influenza vac split quadrivalent PF  0.5 mL Intramuscular  Tomorrow-1000  . levETIRAcetam  500 mg Oral BID  . multivitamin with minerals  1 tablet Oral Daily  . nicotine  14 mg Transdermal Daily  . [START ON 05/22/2014] pneumococcal 23 valent vaccine  0.5 mL Intramuscular Tomorrow-1000  . sodium chloride  3 mL Intravenous Q12H  . thiamine  100 mg Oral Daily   Or  . thiamine  100 mg Intravenous Daily   Continuous Infusions: . sodium chloride 100 mL/hr at 05/20/14 1847    Time spent on care of this patient: 35 minutes   Harlon Kutner, MD 05/21/2014, 10:32 AM  LOS: 1 day   [facility] Office  [phone] Pager - Text Page per [email or web address]  If 7PM-7AM, please contact night-coverage [email or web address]

## 2014-05-21 NOTE — Progress Notes (Signed)
CARE MANAGEMENT NOTE 05/21/2014  Patient:  Dennis Zhang,Dennis Zhang   Account Number:  [id number]  Date Initiated:  05/21/2014  Documentation initiated by:  Ferdinand CavaSCHETTINO,Samiksha Pellicano  Subjective/Objective Assessment:   58 yo male admitted with AKI from home     Action/Plan:   discharge planning   Anticipated DC Date:  05/22/2014   Anticipated DC Plan:  HOME/SELF CARE      DC Planning Services  CM consult  PCP issues      Choice offered to / List presented to:             Status of service:  Completed, signed off Medicare Important Message given?   (If response is "NO", the following Medicare IM given date fields will be blank) Date Medicare IM given:   Medicare IM given by:   Date Additional Medicare IM given:   Additional Medicare IM given by:    Discharge Disposition:  HOME/SELF CARE  Per UR Regulation:    If discussed at Long Length of Stay Meetings, dates discussed:    Comments:  Patient stated he has Medicaid and has had it for 12 years and has a PCP listed on the Medeicaid card and states "I just don't go". Encouraged pt to make follow up appointment, provided a list of Medicaid providers if new PCP desired and also provided [facility]. Patient is already established with Medicaid transportation.

## 2014-05-21 NOTE — Progress Notes (Addendum)
INITIAL NUTRITION ASSESSMENT  DOCUMENTATION CODES Per approved criteria  -Severe malnutrition in the context of chronic illness -Underweight  Pt meets criteria for severe MALNUTRITION in the context of chronic illness as evidenced by PO intake < 75% for > one month, 14% body weight loss in one month.   INTERVENTION: -Recommend Boost TID, each supplement provides 360 kcal, 14 gram protein -RD to continue to monitor  NUTRITION DIAGNOSIS: Inadequate oral intake related to nausea/abd pain/ETOH abuse as evidenced by 20 lb wt loss in 6 months, PO intake < 75%.   Goal: Pt to meet >/= 90% of their estimated nutrition needs    Monitor:  Total protein/energy intake, labs, weights  Reason for Assessment: MST/Underweight BMI  58 y.o. male  Admitting Dx: Acute kidney injury  ASSESSMENT: Dennis MillinLevonda Zhang is a 58 y.o. male with a past medical history of alcohol abuse, arthritis causing chronic pain, hypertension, who has been hospitalized previously for similar issues with alcohol related dehydration. He tells me that he quit drinking alcohol about 5 days ago  -Pt reported ongoing decreased appetite for past 6 months d/t nausea, abd pain, and early satiety. ETOH abuse likely contributing to these symptoms -Diet recall indicates pt consuming one meal daily -Endorsed 20 lb weight loss in 6 months (14% body weight loss, severe for time frame) -Appetite improving during admit, consuming >75% of meals; denied any current n/v -Pt willing to consume Ensure or Boost  2-3 times daily to assist with weight management -Encouraged general healthy nutrition to assist with sobriety and adequate hydration to prevent dehydration  Height: Ht Readings from Last 1 Encounters:  05/20/14 6\' 2"  (1.88 m)    Weight: Wt Readings from Last 1 Encounters:  05/21/14 124 lb 8 oz (56.473 kg)    Ideal Body Weight: 190 lb  % Ideal Body Weight: 65%  Wt Readings from Last 10 Encounters:  05/21/14 124 lb 8 oz  (56.473 kg)  03/03/14 136 lb 6.4 oz (61.871 kg)  11/07/12 149 lb (67.586 kg)  10/25/12 149 lb (67.586 kg)  10/03/12 146 lb 11.2 oz (66.543 kg)    Usual Body Weight: 145-150 lb  % Usual Body Weight: 86%  BMI:  Body mass index is 15.98 kg/(m^2). Underweight  Estimated Nutritional Needs: Kcal: 1700-1900 Protein: 70-85 gram Fluid: >/=1900 ml daily  Skin: WDL  Diet Order: Diet regular  EDUCATION NEEDS: -Education needs addressed   Intake/Output Summary (Last 24 hours) at 05/21/14 1259 Last data filed at 05/21/14 1038  Gross per 24 hour  Intake   1930 ml  Output    400 ml  Net   1530 ml    Last BM: 12/31   Labs:   Recent Labs Lab 05/20/14 1359 05/21/14 0445  NA 137 135  K 3.4* 3.5  CL 98 101  CO2 30 24  BUN 32* 32*  CREATININE 3.30* 2.29*  CALCIUM 8.1* 7.9*  MG 1.2*  --   GLUCOSE 103* 107*    CBG (last 3)   Recent Labs  05/20/14 1033  GLUCAP 95    Scheduled Meds: . feeding supplement (ENSURE COMPLETE)  237 mL Oral BID BM  . folic acid  1 mg Oral Daily  . gabapentin  300 mg Oral BID  . [START ON 05/22/2014] Influenza vac split quadrivalent PF  0.5 mL Intramuscular Tomorrow-1000  . levETIRAcetam  500 mg Oral BID  . multivitamin with minerals  1 tablet Oral Daily  . nicotine  14 mg Transdermal Daily  . [START ON  05/22/2014] pneumococcal 23 valent vaccine  0.5 mL Intramuscular Tomorrow-1000  . sodium chloride  3 mL Intravenous Q12H  . thiamine  100 mg Oral Daily   Or  . thiamine  100 mg Intravenous Daily    Continuous Infusions: . sodium chloride 100 mL/hr at 05/20/14 1847    Past Medical History  Diagnosis Date  . Chronic pain   . Arthritis   . Hypertension   . Chronic pain   . Diabetes mellitus without complication     borderline  . Hyperlipidemia   . Seizures     Past Surgical History  Procedure Laterality Date  . Abdominal surgery    . Hemorroidectomy    . Appendectomy      Lloyd HugerSarah F Kaliana Albino MS RD LDN Clinical  Dietitian [phone]

## 2014-05-22 ENCOUNTER — Emergency Department (HOSPITAL_COMMUNITY)
Admission: EM | Admit: 2014-05-22 | Discharge: 2014-05-22 | Discharge status: Against Medical Advice | Payer: Medicaid Other | Attending: Emergency Medicine | Admitting: Emergency Medicine

## 2014-05-22 ENCOUNTER — Encounter (HOSPITAL_COMMUNITY): Payer: Self-pay | Admitting: Emergency Medicine

## 2014-05-22 DIAGNOSIS — I10 Essential (primary) hypertension: Secondary | ICD-10-CM | POA: Insufficient documentation

## 2014-05-22 DIAGNOSIS — R4182 Altered mental status, unspecified: Secondary | ICD-10-CM | POA: Insufficient documentation

## 2014-05-22 DIAGNOSIS — G8929 Other chronic pain: Secondary | ICD-10-CM | POA: Insufficient documentation

## 2014-05-22 DIAGNOSIS — E119 Type 2 diabetes mellitus without complications: Secondary | ICD-10-CM | POA: Insufficient documentation

## 2014-05-22 DIAGNOSIS — Z720 Tobacco use: Secondary | ICD-10-CM | POA: Insufficient documentation

## 2014-05-22 LAB — BASIC METABOLIC PANEL
ANION GAP: 8 (ref 5–15)
BUN: 22 mg/dL (ref 6–23)
CHLORIDE: 103 meq/L (ref 96–112)
CO2: 27 mmol/L (ref 19–32)
CREATININE: 1.26 mg/dL (ref 0.50–1.35)
Calcium: 8.5 mg/dL (ref 8.4–10.5)
GFR calc non Af Amer: 61 mL/min — ABNORMAL LOW (ref >90)
GFR, EST AFRICAN AMERICAN: 70 mL/min — AB (ref >90)
Glucose, Bld: 90 mg/dL (ref 70–99)
Potassium: 3.4 mmol/L — ABNORMAL LOW (ref 3.5–5.1)
Sodium: 138 mmol/L (ref 135–145)

## 2014-05-22 MED ORDER — HYDRALAZINE HCL 20 MG/ML IJ SOLN
5.0000 mg | INTRAMUSCULAR | Status: DC | PRN
  Administered 2014-05-22: 5 mg via INTRAVENOUS
  Filled 2014-05-22: qty 1

## 2014-05-22 MED ORDER — LEVETIRACETAM 500 MG PO TABS
500.0000 mg | ORAL_TABLET | Freq: Two times a day (BID) | ORAL | Status: DC
Start: 2014-05-22 — End: 2015-04-30

## 2014-05-22 MED ORDER — POTASSIUM CHLORIDE CRYS ER 20 MEQ PO TBCR
40.0000 meq | EXTENDED_RELEASE_TABLET | Freq: Once | ORAL | Status: AC
  Administered 2014-05-22: 40 meq via ORAL
  Filled 2014-05-22: qty 2

## 2014-05-22 MED ORDER — METOPROLOL TARTRATE 25 MG PO TABS
12.5000 mg | ORAL_TABLET | Freq: Two times a day (BID) | ORAL | Status: DC
  Administered 2014-05-22: 12.5 mg via ORAL
  Filled 2014-05-22: qty 1

## 2014-05-22 NOTE — Progress Notes (Deleted)
[facility] Progress Note   Asiel Chrostowski ZOX:[id number] DOB: Nov 30, 1955 DOA: 05/20/2014 PCP: [name], MD  Brief narrative: Dennis Zhang is a 59 y.o. male with a history of alcohol abuse, arthritis with chronic pain and hypertension. The patient presented with nausea vomiting and diarrhea after stopping alcohol 5 days ago. He was lightheaded as well. He was found to be in acute renal failure and therefore admitted   Subjective:  Assessment/Plan: Principal Problem:   Acute kidney injury -Possibly prerenal in relation to fluid losses from vomiting and diarrhea -Improving with hydration-continue to hydrate with IV fluids and follow  Active Problems:   Orthostatic hypotension -Continue to hydrate-symptoms improving    Alcohol abuse -Continue CIWA scale -He states that he does not plan on drinking again    Transaminitis -Likely acute alcoholic hepatitis versus cirrhosis    Thrombocytopenia -Likely secondary to alcohol abuse  Macrocytic anemia -Normal vitamin B-12 -Check Folic acid   Code Status: Full code Family Communication:  Disposition Plan: Home when stable DVT prophylaxis: Avoid blood thinners due to severe thrombocytopenia-SCDs ordered  Consultants: None  Procedures: None  Antibiotics: Anti-infectives    None         Objective: Filed Weights   05/20/14 1745 05/21/14 0618 05/22/14 0608  Weight: 62.2 kg (137 lb 2 oz) 56.473 kg (124 lb 8 oz) 60.873 kg (134 lb 3.2 oz)    Intake/Output Summary (Last 24 hours) at 05/22/14 1031 Last data filed at 05/22/14 8119  Gross per 24 hour  Intake 2358.34 ml  Output    550 ml  Net 1808.34 ml     Vitals Filed Vitals:   05/22/14 0026 05/22/14 0130 05/22/14 0245 05/22/14 0608  BP: 182/89 174/102 133/65 116/72  Pulse: 80   80  Temp:      TempSrc:      Resp: 20   18  Height:      Weight:    60.873 kg (134 lb 3.2 oz)  SpO2: 100%   97%    Exam: General: Sedated and difficult to arouse, No  acute respiratory distress Lungs: Clear to auscultation bilaterally without wheezes or crackles Cardiovascular: Regular rate and rhythm without murmur gallop or rub normal S1 and S2 Abdomen: Nontender, nondistended, soft, bowel sounds positive, no rebound, no ascites, no appreciable mass Extremities: No significant cyanosis, clubbing, or edema bilateral lower extremities  Data Reviewed: Basic Metabolic Panel:  Recent Labs Lab 05/20/14 1359 05/21/14 0445 05/22/14 0535  NA 137 135 138  K 3.4* 3.5 3.4*  CL 98 101 103  CO2 GLUCOSE 103* 107* 90  BUN 32* 32* 22  CREATININE 3.30* 2.29* 1.26  CALCIUM 8.1* 7.9* 8.5  MG 1.2*  --   --    Liver Function Tests:  Recent Labs Lab 05/20/14 1359 05/21/14 0445  AST 74* 74*  ALT 20 19  ALKPHOS 115 106  BILITOT 2.4* 1.7*  PROT 7.9 6.5  ALBUMIN 3.8 3.2*    Recent Labs Lab 05/20/14 1359  LIPASE 19   No results for input(s): AMMONIA in the last 168 hours. CBC:  Recent Labs Lab 05/20/14 1224 05/21/14 0445  WBC 5.3 4.4  NEUTROABS 2.7  --   HGB 13.6 11.7*  HCT 39.5 33.4*  MCV 102.9* 102.8*  PLT 38* 35*   Cardiac Enzymes:  Recent Labs Lab 05/20/14 1359  TROPONINI 0.03   BNP (last 3 results) No results for input(s): PROBNP in the last 8760 hours. CBG:  Recent Labs Lab  05/20/14 1033  GLUCAP 95    No results found for this or any previous visit (from the past 240 hour(s)).   Studies:  Recent x-ray studies have been reviewed in detail by the Attending Physician  Scheduled Meds:  Scheduled Meds: . feeding supplement (ENSURE COMPLETE)  237 mL Oral BID BM  . folic acid  1 mg Oral Daily  . gabapentin  300 mg Oral BID  . Influenza vac split quadrivalent PF  0.5 mL Intramuscular Tomorrow-1000  . levETIRAcetam  500 mg Oral BID  . metoprolol tartrate  12.5 mg Oral BID  . multivitamin with minerals  1 tablet Oral Daily  . nicotine  14 mg Transdermal Daily  . pneumococcal 23 valent vaccine  0.5 mL  Intramuscular Tomorrow-1000  . potassium chloride  40 mEq Oral Once  . sodium chloride  3 mL Intravenous Q12H  . thiamine  100 mg Oral Daily   Or  . thiamine  100 mg Intravenous Daily   Continuous Infusions: . sodium chloride 100 mL/hr at 05/22/14 0454    Time spent on care of this patient: 35 minutes   Zeke Aker, MD 05/22/2014, 10:31 AM  LOS: 2 days   [facility] Office  [phone] Pager - Text Page per [email or web address]  If 7PM-7AM, please contact night-coverage [email or web address]

## 2014-05-22 NOTE — ED Notes (Signed)
Per EMS: Pt's friend called EMS because he was unresponsive on the couch.  Pt required painful stimuli to arouse.  Pt became argumentative.  CBG 108.  States he drank "2 shots" of vodka.

## 2014-05-22 NOTE — ED Notes (Signed)
Bed: [facility]2 Expected date:  Expected time:  Means of arrival:  Comments: EMS 

## 2014-05-22 NOTE — ED Notes (Signed)
Pt ambulatory with steady gait. Educated that he should stay. Visualized leaving. RR even/unlabored.

## 2014-05-22 NOTE — Progress Notes (Addendum)
CSW received notification from RN, Abigail Butts that pt discharged and needing assistance with transportation home. Per RN, bus pass needed, but [employer] transit closed today and CSW provided cab voucher.  CSW met with pt at bedside. Confirmed address. Pt stated that his roommate and two other people are at his home to assist once he arrives. Cab arranged.   Patient anxious to leave and unwilling to complete SBIRT at this time for current substance abuse referral.   CSW signing off.   Alison Murray, MSW, LCSW Clinical Social Work Coverage for [employer], [place]

## 2014-05-22 NOTE — Discharge Summary (Signed)
Physician Discharge Summary  [name] EAV:[id number] DOB: 1955-07-17 DOA: 05/20/2014  PCP: [name], MD  Admit date: 05/20/2014 Discharge date: 05/22/2014  Time spent: 45 minutes    Discharge Condition: stable Diet recommendation: heart heatlhy  Discharge Diagnoses:  Principal Problem:   Acute kidney injury Active Problems:   Orthostatic hypotension   Alcohol abuse   Transaminitis   Thrombocytopenia   Protein-calorie malnutrition, severe   History of present illness:  Dennis Zhang is a 59 y.o. male with a history of alcohol abuse, arthritis with chronic pain and hypertension. The patient presented with nausea vomiting and diarrhea after stopping alcohol 5 days ago. He was lightheaded as well. He was found to be in acute renal failure and therefore admitted   Hospital Course:  Principal Problem:  Acute kidney injury -Possibly prerenal in relation to fluid losses from vomiting and diarrhea -resolved with hydration-  Active Problems:  Orthostatic hypotension -resolved    Alcohol abuse - resolved- last drink 5 days prior to admission -He states that he does not plan on drinking again   Transaminitis -Likely acute alcoholic hepatitis versus cirrhosis  Seizure disorder - placed on Keppra instead of Dilantin as it is safer in setting of ETOH use/cirrhosis an, overall,  has less side effects - patient in agreement to take it- has received it in the hospital without any noted side effects   Thrombocytopenia -Likely secondary to alcohol abuse  Macrocytic anemia -Normal vitamin B-12 -Folic acid not drawn   Procedures:  none  Consultations:  none  Discharge Exam: Filed Weights   05/20/14 1745 05/21/14 0618 05/22/14 7829  Weight: 62.2 kg (137 lb 2 oz) 56.473 kg (124 lb 8 oz) 60.873 kg (134 lb 3.2 oz)   Filed Vitals:   05/22/14 1348  BP: 150/87  Pulse: 86  Temp: 97.3 F (36.3 C)  Resp:     General: AAO x 3, no  distress Cardiovascular: RRR, no murmurs  Respiratory: clear to auscultation bilaterally GI: soft, non-tender, non-distended, bowel sound positive  Discharge Instructions You were cared for by a hospitalist during your hospital stay. If you have any questions about your discharge medications or the care you received while you were in the hospital after you are discharged, you can call the unit and asked to speak with the hospitalist on call if the hospitalist that took care of you is not available. Once you are discharged, your primary care physician will handle any further medical issues. Please note that NO REFILLS for any discharge medications will be authorized once you are discharged, as it is imperative that you return to your primary care physician (or establish a relationship with a primary care physician if you do not have one) for your aftercare needs so that they can reassess your need for medications and monitor your lab values.  Discharge Instructions    Diet - low sodium heart healthy    Complete by:  As directed      Increase activity slowly    Complete by:  As directed             Medication List    STOP taking these medications        LORazepam 1 MG tablet  Commonly known as:  ATIVAN     omeprazole 20 MG capsule  Commonly known as:  PRILOSEC     phenytoin 100 MG ER capsule  Commonly known as:  DILANTIN      TAKE these medications  acetaminophen 500 MG tablet  Commonly known as:  TYLENOL  Take 1,000 mg by mouth every 6 (six) hours as needed (pain).     chlordiazePOXIDE 25 MG capsule  Commonly known as:  LIBRIUM  Take 1 capsule (25 mg total) by mouth 3 (three) times daily as needed for anxiety or withdrawal.     cholecalciferol 1000 UNITS tablet  Commonly known as:  VITAMIN D  Take 1,000 Units by mouth daily.     folic acid 1 MG tablet  Commonly known as:  FOLVITE  Take 1 tablet (1 mg total) by mouth daily.     gabapentin 300 MG capsule  Commonly  known as:  NEURONTIN  Take 1 capsule (300 mg total) by mouth 3 (three) times daily.     HYDROcodone-acetaminophen 5-325 MG per tablet  Commonly known as:  NORCO/VICODIN  Take 1 tablet by mouth every 6 (six) hours as needed for moderate pain (pain).     levETIRAcetam 500 MG tablet  Commonly known as:  KEPPRA  Take 1 tablet (500 mg total) by mouth 2 (two) times daily.     magnesium oxide 400 MG tablet  Commonly known as:  MAG-OX  Take 400 mg by mouth daily.     metoprolol tartrate 25 MG tablet  Commonly known as:  LOPRESSOR  Take 12.5 mg by mouth 2 (two) times daily.     thiamine 100 MG tablet  Take 1 tablet (100 mg total) by mouth daily.       Allergies  Allergen Reactions  . Tylenol [Acetaminophen] Nausea Only    States can take Tylenol if has other pain med w/it - like Hydrocodone      The results of significant diagnostics from this hospitalization (including imaging, microbiology, ancillary and laboratory) are listed below for reference.    Significant Diagnostic Studies: US Renal  05/20/2014   CLINICAL DATA:  Acute renal failure  EXAM: RENAL/URINARY TRACT ULTRASOUND COMPLETE  COMPARISON:  None.  FINDINGS: Right Kidney:  Length: 10.7 cm. Echogenicity within normal limits. No mass or hydronephrosis visualized.  Left Kidney:  Length: 11.2 cm. Echogenicity within normal limits. No mass or hydronephrosis visualized.  Bladder:  Appears normal for degree of bladder distention.  IMPRESSION: Normal renal ultrasound.   Electronically Signed   By: Elige Ko   On: 05/20/2014 19:05   Dg Chest Port 1 View  05/20/2014   CLINICAL DATA:  Dizziness since this morning.  EXAM: PORTABLE CHEST - 1 VIEW  COMPARISON:  03/03/2014  FINDINGS: There is hyperinflation of the lungs compatible with COPD. Scarring in the left base and apices. Heart is normal size. No confluent opacities or effusions.  IMPRESSION: COPD/chronic changes.  No active disease.   Electronically Signed   By: Charlett Nose  M.D.   On: 05/20/2014 13:24   Dg Abd 2 Views  05/20/2014   CLINICAL DATA:  Nausea.  EXAM: ABDOMEN - 2 VIEW  COMPARISON:  None.  FINDINGS: The bowel gas pattern is normal. There is no evidence of free air. No radio-opaque calculi or other significant radiographic abnormality is seen. Degenerative change of both hip joints is noted.  IMPRESSION: No evidence of bowel obstruction or ileus.   Electronically Signed   By: Roque Lias M.D.   On: 05/20/2014 14:14    Microbiology: No results found for this or any previous visit (from the past 240 hour(s)).   Labs: Basic Metabolic Panel:  Recent Labs Lab 05/20/14 1359 05/21/14 0445 05/22/14 0535  NA  137 135 138  K 3.4* 3.5 3.4*  CL 98 101 103  CO2 GLUCOSE 103* 107* 90  BUN 32* 32* 22  CREATININE 3.30* 2.29* 1.26  CALCIUM 8.1* 7.9* 8.5  MG 1.2*  --   --    Liver Function Tests:  Recent Labs Lab 05/20/14 1359 05/21/14 0445  AST 74* 74*  ALT 20 19  ALKPHOS 115 106  BILITOT 2.4* 1.7*  PROT 7.9 6.5  ALBUMIN 3.8 3.2*    Recent Labs Lab 05/20/14 1359  LIPASE 19   No results for input(s): AMMONIA in the last 168 hours. CBC:  Recent Labs Lab 05/20/14 1224 05/21/14 0445  WBC 5.3 4.4  NEUTROABS 2.7  --   HGB 13.6 11.7*  HCT 39.5 33.4*  MCV 102.9* 102.8*  PLT 38* 35*   Cardiac Enzymes:  Recent Labs Lab 05/20/14 1359  TROPONINI 0.03   BNP: BNP (last 3 results) No results for input(s): PROBNP in the last 8760 hours. CBG:  Recent Labs Lab 05/20/14 1033  GLUCAP 95       Signed:  Calvert Cantor, MD [facility] 05/22/2014, 3:26 PM

## 2014-05-24 LAB — FOLATE RBC: RBC FOLATE: 550 ng/mL (ref >280)

## 2014-06-14 ENCOUNTER — Inpatient Hospital Stay (HOSPITAL_COMMUNITY)
Admission: EM | Admit: 2014-06-14 | Discharge: 2014-06-16 | DRG: 682 | Disposition: A | Discharge status: Home / Self Care | Payer: Medicaid Other | Attending: Family Medicine | Admitting: Family Medicine

## 2014-06-14 ENCOUNTER — Emergency Department (HOSPITAL_COMMUNITY): Payer: Medicaid Other

## 2014-06-14 DIAGNOSIS — G40909 Epilepsy, unspecified, not intractable, without status epilepticus: Secondary | ICD-10-CM | POA: Diagnosis present

## 2014-06-14 DIAGNOSIS — R531 Weakness: Secondary | ICD-10-CM | POA: Insufficient documentation

## 2014-06-14 DIAGNOSIS — F1020 Alcohol dependence, uncomplicated: Secondary | ICD-10-CM | POA: Diagnosis present

## 2014-06-14 DIAGNOSIS — I10 Essential (primary) hypertension: Secondary | ICD-10-CM | POA: Diagnosis present

## 2014-06-14 DIAGNOSIS — E43 Unspecified severe protein-calorie malnutrition: Secondary | ICD-10-CM | POA: Diagnosis present

## 2014-06-14 DIAGNOSIS — D696 Thrombocytopenia, unspecified: Secondary | ICD-10-CM | POA: Diagnosis present

## 2014-06-14 DIAGNOSIS — N179 Acute kidney failure, unspecified: Secondary | ICD-10-CM | POA: Diagnosis present

## 2014-06-14 DIAGNOSIS — Z79899 Other long term (current) drug therapy: Secondary | ICD-10-CM

## 2014-06-14 DIAGNOSIS — F1010 Alcohol abuse, uncomplicated: Secondary | ICD-10-CM | POA: Diagnosis present

## 2014-06-14 DIAGNOSIS — F1290 Cannabis use, unspecified, uncomplicated: Secondary | ICD-10-CM | POA: Diagnosis present

## 2014-06-14 DIAGNOSIS — Y901 Blood alcohol level of 20-39 mg/100 ml: Secondary | ICD-10-CM | POA: Diagnosis present

## 2014-06-14 DIAGNOSIS — E119 Type 2 diabetes mellitus without complications: Secondary | ICD-10-CM | POA: Diagnosis present

## 2014-06-14 DIAGNOSIS — E785 Hyperlipidemia, unspecified: Secondary | ICD-10-CM | POA: Diagnosis present

## 2014-06-14 DIAGNOSIS — E876 Hypokalemia: Secondary | ICD-10-CM | POA: Diagnosis present

## 2014-06-14 DIAGNOSIS — R55 Syncope and collapse: Secondary | ICD-10-CM | POA: Diagnosis present

## 2014-06-14 DIAGNOSIS — F17210 Nicotine dependence, cigarettes, uncomplicated: Secondary | ICD-10-CM | POA: Diagnosis present

## 2014-06-14 DIAGNOSIS — Z681 Body mass index (BMI) 19 or less, adult: Secondary | ICD-10-CM

## 2014-06-14 DIAGNOSIS — R9431 Abnormal electrocardiogram [ECG] [EKG]: Secondary | ICD-10-CM | POA: Diagnosis present

## 2014-06-14 DIAGNOSIS — I959 Hypotension, unspecified: Secondary | ICD-10-CM | POA: Diagnosis present

## 2014-06-14 DIAGNOSIS — W19XXXA Unspecified fall, initial encounter: Secondary | ICD-10-CM | POA: Insufficient documentation

## 2014-06-14 DIAGNOSIS — E860 Dehydration: Secondary | ICD-10-CM | POA: Diagnosis present

## 2014-06-14 LAB — URINALYSIS, ROUTINE W REFLEX MICROSCOPIC
Glucose, UA: NEGATIVE mg/dL
Hgb urine dipstick: NEGATIVE
KETONES UR: NEGATIVE mg/dL
Nitrite: POSITIVE — AB
PH: 5 (ref 5.0–8.0)
PROTEIN: 100 mg/dL — AB
SPECIFIC GRAVITY, URINE: 1.025 (ref 1.005–1.030)
UROBILINOGEN UA: 1 mg/dL (ref 0.0–1.0)

## 2014-06-14 LAB — CBC WITH DIFFERENTIAL/PLATELET
Basophils Absolute: 0 10*3/uL (ref 0.0–0.1)
Basophils Relative: 0 % (ref 0–1)
Eosinophils Absolute: 0 10*3/uL (ref 0.0–0.7)
Eosinophils Relative: 1 % (ref 0–5)
HEMATOCRIT: 40.4 % (ref 39.0–52.0)
HEMOGLOBIN: 14.3 g/dL (ref 13.0–17.0)
LYMPHS PCT: 40 % (ref 12–46)
Lymphs Abs: 1.7 10*3/uL (ref 0.7–4.0)
MCH: 35.8 pg — AB (ref 26.0–34.0)
MCHC: 35.4 g/dL (ref 30.0–36.0)
MCV: 101.3 fL — AB (ref 78.0–100.0)
Monocytes Absolute: 0.5 10*3/uL (ref 0.1–1.0)
Monocytes Relative: 11 % (ref 3–12)
NEUTROS PCT: 48 % (ref 43–77)
Neutro Abs: 2.1 10*3/uL (ref 1.7–7.7)
Platelets: 53 10*3/uL — ABNORMAL LOW (ref 150–400)
RBC: 3.99 MIL/uL — AB (ref 4.22–5.81)
RDW: 12.7 % (ref 11.5–15.5)
WBC: 4.3 10*3/uL (ref 4.0–10.5)

## 2014-06-14 LAB — COMPREHENSIVE METABOLIC PANEL
ALT: 33 U/L (ref 0–53)
AST: 147 U/L — ABNORMAL HIGH (ref 0–37)
Albumin: 3.9 g/dL (ref 3.5–5.2)
Alkaline Phosphatase: 127 U/L — ABNORMAL HIGH (ref 39–117)
Anion gap: 14 (ref 5–15)
BILIRUBIN TOTAL: 2.2 mg/dL — AB (ref 0.3–1.2)
BUN: 17 mg/dL (ref 6–23)
CALCIUM: 8.9 mg/dL (ref 8.4–10.5)
CHLORIDE: 93 mmol/L — AB (ref 96–112)
CO2: 26 mmol/L (ref 19–32)
Creatinine, Ser: 2.36 mg/dL — ABNORMAL HIGH (ref 0.50–1.35)
GFR calc Af Amer: 33 mL/min — ABNORMAL LOW (ref >90)
GFR, EST NON AFRICAN AMERICAN: 28 mL/min — AB (ref >90)
Glucose, Bld: 137 mg/dL — ABNORMAL HIGH (ref 70–99)
POTASSIUM: 2.9 mmol/L — AB (ref 3.5–5.1)
Sodium: 133 mmol/L — ABNORMAL LOW (ref 135–145)
Total Protein: 7.9 g/dL (ref 6.0–8.3)

## 2014-06-14 LAB — RAPID URINE DRUG SCREEN, HOSP PERFORMED
Amphetamines: NOT DETECTED
Barbiturates: NOT DETECTED
Benzodiazepines: NOT DETECTED
COCAINE: NOT DETECTED
Opiates: POSITIVE — AB
TETRAHYDROCANNABINOL: NOT DETECTED

## 2014-06-14 LAB — URINE MICROSCOPIC-ADD ON

## 2014-06-14 LAB — I-STAT CG4 LACTIC ACID, ED: Lactic Acid, Venous: 1.92 mmol/L (ref 0.5–2.0)

## 2014-06-14 LAB — AMMONIA: Ammonia: 60 umol/L — ABNORMAL HIGH (ref 11–32)

## 2014-06-14 LAB — LIPASE, BLOOD: Lipase: 21 U/L (ref 11–59)

## 2014-06-14 LAB — PHENYTOIN LEVEL, TOTAL: Phenytoin Lvl: <2.5 ug/mL — ABNORMAL LOW (ref 10.0–20.0)

## 2014-06-14 LAB — PROTIME-INR
INR: 1.1 (ref 0.00–1.49)
PROTHROMBIN TIME: 14.3 s (ref 11.6–15.2)

## 2014-06-14 LAB — ETHANOL: Alcohol, Ethyl (B): 22 mg/dL — ABNORMAL HIGH (ref 0–9)

## 2014-06-14 LAB — TROPONIN I: Troponin I: <0.03 ng/mL (ref <0.031)

## 2014-06-14 LAB — LACTIC ACID, PLASMA: LACTIC ACID, VENOUS: 4.6 mmol/L — AB (ref 0.5–2.0)

## 2014-06-14 MED ORDER — SODIUM CHLORIDE 0.9 % IV SOLN
1000.0000 mL | Freq: Once | INTRAVENOUS | Status: AC
  Administered 2014-06-14: 1000 mL via INTRAVENOUS

## 2014-06-14 MED ORDER — SODIUM CHLORIDE 0.9 % IV SOLN
1000.0000 mL | INTRAVENOUS | Status: DC
  Administered 2014-06-14: 1000 mL via INTRAVENOUS

## 2014-06-14 MED ORDER — MORPHINE SULFATE 4 MG/ML IJ SOLN
4.0000 mg | Freq: Once | INTRAMUSCULAR | Status: AC
Start: 2014-06-14 — End: 2014-06-14
  Administered 2014-06-14: 4 mg via INTRAVENOUS
  Filled 2014-06-14: qty 1

## 2014-06-14 MED ORDER — POTASSIUM CHLORIDE CRYS ER 20 MEQ PO TBCR
40.0000 meq | EXTENDED_RELEASE_TABLET | Freq: Once | ORAL | Status: AC
  Administered 2014-06-14: 40 meq via ORAL
  Filled 2014-06-14: qty 2

## 2014-06-14 MED ORDER — POTASSIUM CHLORIDE 10 MEQ/100ML IV SOLN
10.0000 meq | INTRAVENOUS | Status: AC
  Administered 2014-06-14 (×2): 10 meq via INTRAVENOUS
  Filled 2014-06-14 (×2): qty 100

## 2014-06-14 NOTE — ED Notes (Signed)
Per EMS: Syncopal episodes (several x 2 days).  Roommate states they last 2-3 mins, possibly up to 5.  Increased weakness.  Hypotensive upon arrival. Decreased oral intake d/t weakness.

## 2014-06-14 NOTE — ED Provider Notes (Signed)
CSN: [id number]     Arrival date & time 06/14/14  1737 History   First MD Initiated Contact with Patient 06/14/14 1757     Chief Complaint  Patient presents with  . Loss of Consciousness     The history is provided by the patient.   Patient presents complaining of syncope several times 2 days.  He states this occurs every time he stands up.  He reports increased weakness as well.  He denies fevers and chills.  Denies nausea vomiting and diarrhea.  Denies abdominal pain.  Reports no new rash.  He feels somewhat weak and drowsy at this time.  He reports decreased oral intake but denies nausea or anorexia.  EMS reports that the patient has been drinking tea and alcohol.    Past Medical History  Diagnosis Date  . Chronic pain   . Arthritis   . Hypertension   . Chronic pain   . Diabetes mellitus without complication     borderline  . Hyperlipidemia   . Seizures    Past Surgical History  Procedure Laterality Date  . Abdominal surgery    . Hemorroidectomy    . Appendectomy     Family History  Problem Relation Age of Onset  . Hypertension Mother   . Migraines Sister   . Heart failure Brother   . Migraines Brother    History  Substance Use Topics  . Smoking status: Current Every Day Smoker -- 0.50 packs/day for 30 years    Types: Cigarettes  . Smokeless tobacco: Never Used  . Alcohol Use: Yes     Comment: occ    Review of Systems  All other systems reviewed and are negative.     Allergies  Tylenol  Home Medications   Prior to Admission medications   Medication Sig Start Date End Date Taking? Authorizing Provider  acetaminophen (TYLENOL) 500 MG tablet Take 1,000 mg by mouth every 6 (six) hours as needed (pain).     Historical Provider, MD  chlordiazePOXIDE (LIBRIUM) 25 MG capsule Take 1 capsule (25 mg total) by mouth 3 (three) times daily as needed for anxiety or withdrawal. 03/04/14   Linwood DibblesJon Knapp, MD  cholecalciferol (VITAMIN D) 1000 UNITS tablet Take 1,000 Units  by mouth daily.    Historical Provider, MD  folic acid (FOLVITE) 1 MG tablet Take 1 tablet (1 mg total) by mouth daily. 10/08/12   Penny Piarlando Vega, MD  gabapentin (NEURONTIN) 300 MG capsule Take 1 capsule (300 mg total) by mouth 3 (three) times daily. Patient taking differently: Take 300 mg by mouth 2 (two) times daily.  07/31/12   Arie Sabinaatherine E Schinlever, PA-C  HYDROcodone-acetaminophen (NORCO/VICODIN) 5-325 MG per tablet Take 1 tablet by mouth every 6 (six) hours as needed for moderate pain (pain).    Historical Provider, MD  levETIRAcetam (KEPPRA) 500 MG tablet Take 1 tablet (500 mg total) by mouth 2 (two) times daily. 05/22/14   Calvert CantorSaima Rizwan, MD  magnesium oxide (MAG-OX) 400 MG tablet Take 400 mg by mouth daily.    Historical Provider, MD  metoprolol tartrate (LOPRESSOR) 25 MG tablet Take 12.5 mg by mouth 2 (two) times daily.    Historical Provider, MD  thiamine 100 MG tablet Take 1 tablet (100 mg total) by mouth daily. 03/04/14   Dawood Elgergawy, MD   BP 83/57 mmHg  Pulse 71  Temp(Src) 98.8 F (37.1 C) (Oral)  Resp 21  SpO2 100% Physical Exam  Constitutional: He is oriented to person, place, and time. He  appears well-developed and well-nourished.  HENT:  Head: Normocephalic and atraumatic.  Dry mucous membranes  Eyes: EOM are normal.  Neck: Normal range of motion.  Cardiovascular: Normal rate, regular rhythm, normal heart sounds and intact distal pulses.   Pulmonary/Chest: Effort normal and breath sounds normal. No respiratory distress.  Abdominal: Soft. He exhibits no distension. There is no tenderness.  Musculoskeletal: Normal range of motion.  Neurological: He is alert and oriented to person, place, and time.  Skin: Skin is warm and dry.  Psychiatric: He has a normal mood and affect. Judgment normal.  Nursing note and vitals reviewed.   ED Course  Procedures (including critical care time) Labs Review Labs Reviewed  CBC WITH DIFFERENTIAL/PLATELET - Abnormal; Notable for the  following:    RBC 3.99 (*)    MCV 101.3 (*)    MCH 35.8 (*)    Platelets 53 (*)    All other components within normal limits  COMPREHENSIVE METABOLIC PANEL - Abnormal; Notable for the following:    Sodium 133 (*)    Potassium 2.9 (*)    Chloride 93 (*)    Glucose, Bld 137 (*)    Creatinine, Ser 2.36 (*)    AST 147 (*)    Alkaline Phosphatase 127 (*)    Total Bilirubin 2.2 (*)    GFR calc non Af Amer 28 (*)    GFR calc Af Amer 33 (*)    All other components within normal limits  ETHANOL - Abnormal; Notable for the following:    Alcohol, Ethyl (B) 22 (*)    All other components within normal limits  LACTIC ACID, PLASMA - Abnormal; Notable for the following:    Lactic Acid, Venous 4.6 (*)    All other components within normal limits  AMMONIA - Abnormal; Notable for the following:    Ammonia 60 (*)    All other components within normal limits  URINE RAPID DRUG SCREEN (HOSP PERFORMED) - Abnormal; Notable for the following:    Opiates POSITIVE (*)    All other components within normal limits  URINALYSIS, ROUTINE W REFLEX MICROSCOPIC - Abnormal; Notable for the following:    Color, Urine ORANGE (*)    APPearance TURBID (*)    Bilirubin Urine MODERATE (*)    Protein, ur 100 (*)    Nitrite POSITIVE (*)    Leukocytes, UA SMALL (*)    All other components within normal limits  PHENYTOIN LEVEL, TOTAL - Abnormal; Notable for the following:    Phenytoin Lvl <2.5 (*)    All other components within normal limits  URINE MICROSCOPIC-ADD ON - Abnormal; Notable for the following:    Casts HYALINE CASTS (*)    All other components within normal limits  CULTURE, BLOOD (ROUTINE X 2)  CULTURE, BLOOD (ROUTINE X 2)  URINE CULTURE  PROTIME-INR  TROPONIN I  LIPASE, BLOOD  I-STAT CG4 LACTIC ACID, ED    Imaging Review Dg Chest Portable 1 View  06/14/2014   CLINICAL DATA:  Syncopal episodes for several days. Increased weakness and hypotension. History of hypertension and seizures. Initial  encounter.  EXAM: PORTABLE CHEST - 1 VIEW  COMPARISON:  Radiographs 05/20/2014 and 03/03/2014.  FINDINGS: 1818 hr. Two views were obtained. Stable heart size and mediastinal contours. The lungs are clear. There is no pleural effusion or pneumothorax. The osseous structures appear unchanged.  IMPRESSION: Stable chest.  No acute cardiopulmonary process.   Electronically Signed   By: Roxy Horseman M.D.   On: 06/14/2014 18:35  I personally reviewed the imaging tests through PACS system I reviewed available ER/hospitalization records through the EMR    EKG Interpretation None      MDM   Final diagnoses:  Weakness  Hypotension   10:34 PM Patient is feeling much better at this time.  His lactate is normalized after IV fluids.  His blood pressures normalized as well.  Will get the patient up to walk him around to make sure he feels okay.  He's keeping fluids and solids down the emergency department.  Mild hypokalemia.  This is being replaced IV at this time but will finish his repletion orally as an outpatient.  Doubt sepsis.  Patient became lightheaded and nearly passed out while ambulating in the hall.  Patient be admitted to the hospital.    Lyanne Co, MD 06/17/14 [phone]

## 2014-06-14 NOTE — ED Notes (Signed)
I have laid pt. Flat and currently have two liters of nss each infusing at 94399ml/hour/per pump.  He remains responsive and answers questions regarding his situation appropriately.

## 2014-06-14 NOTE — ED Notes (Signed)
Patient belongings are in 2 patient belonging bags. Patient has boots in 1 bag. Black jacket, grey shirt, orange shirt, black hat, 2 yellow braclets, silver watch and wallet with buss pass (no money or cell phone present). Patient still has green socks and pants on. Bags are in patient room at bedside.

## 2014-06-14 NOTE — ED Notes (Signed)
All of our efforts thus far have been to establish IV's/obtain lab work.  He was drowsy and in no distress upon arrival, and continues to be so.  Pt. Is not a good historian, stating mainly that he simply had "not been feeling well".  His skin is pale and cool with tenting.  He also states his appetite has been poor.  My colleague started an IV in his right arm using u/s to guide it.  He is in nsr per monitor.

## 2014-06-14 NOTE — ED Notes (Signed)
He is now awake, alert and remains in no distress.  His skin is now normal, warm and dry and he is breathing normally.

## 2014-06-15 ENCOUNTER — Encounter (HOSPITAL_COMMUNITY): Payer: Self-pay

## 2014-06-15 ENCOUNTER — Inpatient Hospital Stay (HOSPITAL_COMMUNITY): Payer: Medicaid Other

## 2014-06-15 DIAGNOSIS — E876 Hypokalemia: Secondary | ICD-10-CM

## 2014-06-15 DIAGNOSIS — I959 Hypotension, unspecified: Secondary | ICD-10-CM

## 2014-06-15 DIAGNOSIS — W19XXXA Unspecified fall, initial encounter: Secondary | ICD-10-CM

## 2014-06-15 DIAGNOSIS — E860 Dehydration: Secondary | ICD-10-CM

## 2014-06-15 DIAGNOSIS — D696 Thrombocytopenia, unspecified: Secondary | ICD-10-CM

## 2014-06-15 DIAGNOSIS — N179 Acute kidney failure, unspecified: Principal | ICD-10-CM

## 2014-06-15 DIAGNOSIS — I952 Hypotension due to drugs: Secondary | ICD-10-CM

## 2014-06-15 DIAGNOSIS — F1010 Alcohol abuse, uncomplicated: Secondary | ICD-10-CM

## 2014-06-15 DIAGNOSIS — R55 Syncope and collapse: Secondary | ICD-10-CM

## 2014-06-15 DIAGNOSIS — R531 Weakness: Secondary | ICD-10-CM

## 2014-06-15 LAB — AMMONIA: AMMONIA: 68 umol/L — AB (ref 11–32)

## 2014-06-15 LAB — CBC WITH DIFFERENTIAL/PLATELET
BASOS ABS: 0 10*3/uL (ref 0.0–0.1)
Basophils Relative: 0 % (ref 0–1)
EOS PCT: 1 % (ref 0–5)
Eosinophils Absolute: 0 10*3/uL (ref 0.0–0.7)
HCT: 30.3 % — ABNORMAL LOW (ref 39.0–52.0)
HEMOGLOBIN: 10.5 g/dL — AB (ref 13.0–17.0)
LYMPHS PCT: 40 % (ref 12–46)
Lymphs Abs: 1.4 10*3/uL (ref 0.7–4.0)
MCH: 35.2 pg — ABNORMAL HIGH (ref 26.0–34.0)
MCHC: 34.7 g/dL (ref 30.0–36.0)
MCV: 101.7 fL — AB (ref 78.0–100.0)
MONO ABS: 0.3 10*3/uL (ref 0.1–1.0)
Monocytes Relative: 7 % (ref 3–12)
Neutro Abs: 1.8 10*3/uL (ref 1.7–7.7)
Neutrophils Relative %: 52 % (ref 43–77)
Platelets: 35 10*3/uL — ABNORMAL LOW (ref 150–400)
RBC: 2.98 MIL/uL — ABNORMAL LOW (ref 4.22–5.81)
RDW: 12.9 % (ref 11.5–15.5)
WBC: 3.6 10*3/uL — ABNORMAL LOW (ref 4.0–10.5)

## 2014-06-15 LAB — COMPREHENSIVE METABOLIC PANEL
ALBUMIN: 3 g/dL — AB (ref 3.5–5.2)
ALT: 24 U/L (ref 0–53)
AST: 90 U/L — AB (ref 0–37)
Alkaline Phosphatase: 102 U/L (ref 39–117)
Anion gap: 7 (ref 5–15)
BUN: 16 mg/dL (ref 6–23)
CALCIUM: 7.8 mg/dL — AB (ref 8.4–10.5)
CO2: 26 mmol/L (ref 19–32)
Chloride: 105 mmol/L (ref 96–112)
Creatinine, Ser: 1.28 mg/dL (ref 0.50–1.35)
GFR calc Af Amer: 69 mL/min — ABNORMAL LOW (ref >90)
GFR calc non Af Amer: 60 mL/min — ABNORMAL LOW (ref >90)
GLUCOSE: 118 mg/dL — AB (ref 70–99)
Potassium: 3.7 mmol/L (ref 3.5–5.1)
SODIUM: 138 mmol/L (ref 135–145)
TOTAL PROTEIN: 5.7 g/dL — AB (ref 6.0–8.3)
Total Bilirubin: 1.8 mg/dL — ABNORMAL HIGH (ref 0.3–1.2)

## 2014-06-15 LAB — HEPATITIS PANEL, ACUTE
HCV AB: NEGATIVE
HEP A IGM: NONREACTIVE
HEP B C IGM: NONREACTIVE
Hepatitis B Surface Ag: NEGATIVE

## 2014-06-15 LAB — URINE CULTURE
CULTURE: NO GROWTH
Colony Count: NO GROWTH

## 2014-06-15 LAB — TROPONIN I: Troponin I: <0.03 ng/mL (ref <0.031)

## 2014-06-15 LAB — CK: CK TOTAL: 226 U/L (ref 7–232)

## 2014-06-15 LAB — MAGNESIUM: MAGNESIUM: 1.1 mg/dL — AB (ref 1.5–2.5)

## 2014-06-15 MED ORDER — SODIUM CHLORIDE 0.9 % IJ SOLN
3.0000 mL | Freq: Two times a day (BID) | INTRAMUSCULAR | Status: DC
  Administered 2014-06-15: 3 mL via INTRAVENOUS

## 2014-06-15 MED ORDER — CETYLPYRIDINIUM CHLORIDE 0.05 % MT LIQD
7.0000 mL | Freq: Two times a day (BID) | OROMUCOSAL | Status: DC
  Administered 2014-06-15 (×3): 7 mL via OROMUCOSAL

## 2014-06-15 MED ORDER — HYDRALAZINE HCL 20 MG/ML IJ SOLN
10.0000 mg | INTRAMUSCULAR | Status: DC | PRN
  Administered 2014-06-16: 10 mg via INTRAVENOUS
  Filled 2014-06-15: qty 1

## 2014-06-15 MED ORDER — ENSURE COMPLETE PO LIQD
237.0000 mL | Freq: Two times a day (BID) | ORAL | Status: DC
  Administered 2014-06-15: 237 mL via ORAL

## 2014-06-15 MED ORDER — VITAMIN B-1 100 MG PO TABS
100.0000 mg | ORAL_TABLET | Freq: Every day | ORAL | Status: DC
  Administered 2014-06-15: 100 mg via ORAL
  Filled 2014-06-15 (×2): qty 1

## 2014-06-15 MED ORDER — FOLIC ACID 1 MG PO TABS
1.0000 mg | ORAL_TABLET | Freq: Every day | ORAL | Status: DC
  Administered 2014-06-15: 1 mg via ORAL
  Filled 2014-06-15 (×2): qty 1

## 2014-06-15 MED ORDER — LORAZEPAM 1 MG PO TABS: 0.0000 mg | ORAL_TABLET | Freq: Two times a day (BID) | ORAL | Status: DC

## 2014-06-15 MED ORDER — LEVETIRACETAM IN NACL 1000 MG/100ML IV SOLN
1000.0000 mg | Freq: Once | INTRAVENOUS | Status: AC
  Administered 2014-06-15: 1000 mg via INTRAVENOUS
  Filled 2014-06-15: qty 100

## 2014-06-15 MED ORDER — MAGNESIUM OXIDE 400 (241.3 MG) MG PO TABS
400.0000 mg | ORAL_TABLET | Freq: Every day | ORAL | Status: DC
  Administered 2014-06-15: 400 mg via ORAL
  Filled 2014-06-15 (×2): qty 1

## 2014-06-15 MED ORDER — LORAZEPAM 1 MG PO TABS
0.0000 mg | ORAL_TABLET | Freq: Four times a day (QID) | ORAL | Status: DC
  Administered 2014-06-16: 1 mg via ORAL

## 2014-06-15 MED ORDER — METOPROLOL TARTRATE 25 MG PO TABS
12.5000 mg | ORAL_TABLET | Freq: Two times a day (BID) | ORAL | Status: DC
  Administered 2014-06-15 (×3): 12.5 mg via ORAL
  Filled 2014-06-15 (×3): qty 1

## 2014-06-15 MED ORDER — HYDROCODONE-ACETAMINOPHEN 5-325 MG PO TABS
1.0000 | ORAL_TABLET | Freq: Four times a day (QID) | ORAL | Status: DC | PRN
  Administered 2014-06-15 – 2014-06-16 (×3): 2 via ORAL
  Filled 2014-06-15 (×3): qty 2

## 2014-06-15 MED ORDER — ENSURE COMPLETE PO LIQD
237.0000 mL | Freq: Three times a day (TID) | ORAL | Status: DC
  Administered 2014-06-15: 237 mL via ORAL

## 2014-06-15 MED ORDER — LEVETIRACETAM 500 MG PO TABS
500.0000 mg | ORAL_TABLET | Freq: Two times a day (BID) | ORAL | Status: DC
  Administered 2014-06-15 (×2): 500 mg via ORAL
  Filled 2014-06-15 (×4): qty 1

## 2014-06-15 MED ORDER — GABAPENTIN 300 MG PO CAPS
300.0000 mg | ORAL_CAPSULE | Freq: Two times a day (BID) | ORAL | Status: DC
  Administered 2014-06-15 (×2): 300 mg via ORAL
  Filled 2014-06-15 (×4): qty 1

## 2014-06-15 MED ORDER — POTASSIUM CHLORIDE IN NACL 20-0.9 MEQ/L-% IV SOLN
INTRAVENOUS | Status: AC
  Administered 2014-06-15 (×3): via INTRAVENOUS
  Filled 2014-06-15 (×3): qty 1000

## 2014-06-15 MED ORDER — ONDANSETRON HCL 4 MG PO TABS: 4.0000 mg | ORAL_TABLET | Freq: Four times a day (QID) | ORAL | Status: DC | PRN

## 2014-06-15 MED ORDER — ADULT MULTIVITAMIN W/MINERALS CH
1.0000 | ORAL_TABLET | Freq: Every day | ORAL | Status: DC
  Administered 2014-06-15: 1 via ORAL
  Filled 2014-06-15 (×2): qty 1

## 2014-06-15 MED ORDER — ONDANSETRON HCL 4 MG/2ML IJ SOLN: 4.0000 mg | Freq: Four times a day (QID) | INTRAMUSCULAR | Status: DC | PRN

## 2014-06-15 MED ORDER — LORAZEPAM 2 MG/ML IJ SOLN: 1.0000 mg | Freq: Four times a day (QID) | INTRAMUSCULAR | Status: DC | PRN

## 2014-06-15 MED ORDER — THIAMINE HCL 100 MG/ML IJ SOLN
100.0000 mg | Freq: Every day | INTRAMUSCULAR | Status: DC
  Filled 2014-06-15 (×2): qty 1

## 2014-06-15 MED ORDER — HYDROCODONE-ACETAMINOPHEN 5-325 MG PO TABS
1.0000 | ORAL_TABLET | Freq: Four times a day (QID) | ORAL | Status: DC | PRN
  Administered 2014-06-15 (×2): 1 via ORAL
  Filled 2014-06-15 (×2): qty 1

## 2014-06-15 MED ORDER — LORAZEPAM 1 MG PO TABS
1.0000 mg | ORAL_TABLET | Freq: Four times a day (QID) | ORAL | Status: DC | PRN
  Filled 2014-06-15: qty 1

## 2014-06-15 MED ORDER — FOLIC ACID 1 MG PO TABS: 1.0000 mg | ORAL_TABLET | Freq: Every day | ORAL | Status: DC

## 2014-06-15 NOTE — Progress Notes (Signed)
8:45 AM I agree with HPI/GPe and A/P per Dr. Toniann FailKakrakandy  59 y/o ? known ETOH with resultant TCP/Alcoholic liver disease, Chronic pancreatitis on MR abd 10/03/12 + Fatty liver, Multiple admission 05/22/14 & 02/2014 for syncopal episodes 2/2 to orthostasis, prior episodes AKI with new baseline 02/2014 Ck-Glt Equation CKD stg 4, admitted early 06/15/14 c orthostasis, dizzyness, poor po intake and New AKI    Doing fair.  No co/n/v/sob Able to stand right now without feeling dizzy  States he drinks "becasue i have pain" Wants help[ quitting [place] Lives @ home c friend who noted he was dizzy and had to pick him up off the floor Currently feels much better    Patient Active Problem List   Diagnosis Date Noted  . Syncope 06/15/2014  . Hypotension 06/15/2014  . Dehydration 06/14/2014  . Protein-calorie malnutrition, severe 05/21/2014  . Acute kidney injury 05/20/2014  . Orthostatic hypotension 05/20/2014  . Alcohol abuse 05/20/2014  . Transaminitis 05/20/2014  . Thrombocytopenia 05/20/2014  . Acute renal failure 03/03/2014  . Alcohol intoxication 03/03/2014  . Increased anion gap metabolic acidosis 03/03/2014  . Malnutrition of moderate degree 03/03/2014  . Hypokalemia 10/06/2012  . Gram-negative bacteremia 10/06/2012  . Leukocytosis 10/03/2012  . Chronic pancreatitis 10/03/2012  . AKI (acute kidney injury) 10/03/2012  . Alcoholic hepatitis 10/03/2012  . Hypoglycemia secondary to sulfonylurea 10/03/2012   Replete IVF aggressively-D/c ACe from his MAR forever-written in allergy section K normalized S/w consult for ETOH Needs close coordination [facility] Orthostatics @ 0900 are resolving Pt to work with him today-could possibly d/c am?

## 2014-06-15 NOTE — Progress Notes (Signed)
INITIAL NUTRITION ASSESSMENT  DOCUMENTATION CODES Per approved criteria  -Not Applicable   INTERVENTION: Provide Ensure Complete po TID, each supplement provides 350 kcal and 13 grams of protein  Provided Ensure/Boost coupons for discharge Encourage PO intake  NUTRITION DIAGNOSIS: Increased nutrient (protein) needs related to ETOH abuse as evidenced by estimated nutritional needs.   Goal: Pt to meet >/= 90% of their estimated nutrition needs   Monitor:  PO and supplemental intake, weight, labs, I/O's  Reason for Assessment: Pt identified as at nutrition risk on the Malnutrition Screen Tool  Admitting Dx: Syncope  ASSESSMENT: 59 y.o. male with a history of hypertension, alcoholism, seizure disorder was brought to the ER after patient had a brief episode of loss of consciousness.  Pt recently nutritionally assessed on 12/31. Was diagnosed with severe malnutrition. Pt was underweight at that time (wt:124 lb).  Suspect poor diet quality PTA as pt with Hx of ETOH.  Pt states he has been trying to gain weight and is drinking Ensure/Boost supplements at home. States he tries to drink TID. RD to increase Ensure order to TID. Provided pt with Ensure/Boost coupons for after discharge.  PO intake: 100%  Labs reviewed: Low Mg  Height: Ht Readings from Last 1 Encounters:  06/15/14  (1.88 m)    Weight: Wt Readings from Last 1 Encounters:  06/15/14 152 lb 5.4 oz (69.1 kg)    Ideal Body Weight: 190 lb  % Ideal Body Weight: 80%  Wt Readings from Last 10 Encounters:  06/15/14 152 lb 5.4 oz (69.1 kg)  11/07/12 149 lb (67.586 kg)  10/25/12 149 lb (67.586 kg)  10/03/12 146 lb 11.2 oz (66.543 kg)    Usual Body Weight: 145-150 lb  % Usual Body Weight: 101%  BMI:  Body mass index is 19.55 kg/(m^2).  Estimated Nutritional Needs: Kcal: 2100-2300 Protein: 95-105g Fluid: 2.1L/day  Skin: intact  Diet Order: Diet regular  EDUCATION NEEDS:  -No education needs  identified at this time   Intake/Output Summary (Last 24 hours) at 06/15/14 1418 Last data filed at 06/15/14 1350  Gross per 24 hour  Intake    840 ml  Output    525 ml  Net    315 ml    Last BM: PTA  Labs:   Recent Labs Lab 06/14/14 1756 06/15/14 0450 06/15/14 0455  NA 133*  --  138  K 2.9*  --  3.7  CL 93*  --  105  CO2 26  --  26  BUN 17  --  16  CREATININE 2.36*  --  1.28  CALCIUM 8.9  --  7.8*  MG  --  1.1*  --   GLUCOSE 137*  --  118*    CBG (last 3)  No results for input(s): GLUCAP in the last 72 hours.  Scheduled Meds: . antiseptic oral rinse  7 mL Mouth Rinse BID  . feeding supplement (ENSURE COMPLETE)  237 mL Oral BID BM  . folic acid  1 mg Oral Daily  . gabapentin  300 mg Oral BID  . levETIRAcetam  500 mg Oral BID  . LORazepam  0-4 mg Oral 4 times per day   Followed by  . [START ON 06/17/2014] LORazepam  0-4 mg Oral Q12H  . magnesium oxide  400 mg Oral Daily  . metoprolol tartrate  12.5 mg Oral BID  . multivitamin with minerals  1 tablet Oral Daily  . sodium chloride  3 mL Intravenous Q12H  . thiamine  100 mg Oral Daily   Or  . thiamine  100 mg Intravenous Daily    Continuous Infusions: . 0.9 % NaCl with KCl 20 mEq / L 100 mL/hr at 06/15/14 1328    Past Medical History  Diagnosis Date  . Chronic pain   . Arthritis   . Hypertension   . Chronic pain   . Diabetes mellitus without complication     borderline  . Hyperlipidemia   . Seizures     Past Surgical History  Procedure Laterality Date  . Abdominal surgery    . Hemorroidectomy    . Appendectomy      Tilda FrancoLindsey Lavender Stanke, MS, RD, LDN Pager: [phone] After Hours Pager: [phone]

## 2014-06-15 NOTE — Progress Notes (Signed)
[facility]/14 [place] [facility] [place] [phone] Pt's PCP there is Dr Bennie Pierinioss Armour. Left message with receptionist that [facility]. [facility] given [facility]  [phone] (fee bases) to call.

## 2014-06-15 NOTE — H&P (Signed)
[facility] History and Physical  Dennis MillinLevonda Zhang ZOX:[id number] DOB: 01/22/1956 DOA: 06/14/2014  Referring physician: ER physician. PCP: Jyl HeinzARMOUR, ROSS B, MD   Chief Complaint: Loss of consciousness.  HPI: Dennis MillinLevonda Munday is a 59 y.o. male with a history of hypertension, alcoholism, seizure disorder was brought to the ER after patient had a brief episode of loss of consciousness. Patient states he was trying to walk to his kitchen when he suddenly lost consciousness. His friend witnessed the fall and as per the nurse with whom the friend discussed patient had a brief convulsive episode after the fall. Patient did not have any incontinence of urine or tongue bite. The episode lasted for less than 2 minutes and patient regained consciousness spontaneously. EMS was called. On arrival patient was found to be hypotensive with blood pressure in the 70 systolic. Patient was brought to the ER and patient's lactic acid is found to be elevated and patient received 2 L normal saline bolus. Following the patient's lactic acid level improved. EKG shows normal sinus rhythm with prolonged QT and patient also has hypokalemia. Patient was recently admitted for nausea vomiting. Patient's creatinine is elevated. Patient will be admitted for further management of syncope and hypokalemia and acute renal failure. Patient states that he usually takes Vicodin which he has run out recently.   Review of Systems: As presented in the history of presenting illness, rest negative.  Past Medical History  Diagnosis Date  . Chronic pain   . Arthritis   . Hypertension   . Chronic pain   . Diabetes mellitus without complication     borderline  . Hyperlipidemia   . Seizures    Past Surgical History  Procedure Laterality Date  . Abdominal surgery    . Hemorroidectomy    . Appendectomy     Social History:  reports that he has been smoking Cigarettes.  He has a 15 pack-year smoking history. He has never used smokeless  tobacco. He reports that he drinks alcohol. He reports that he uses illicit drugs (Marijuana). Where does patient live at home. Can patient participate in ADLs? Yes.  Allergies  Allergen Reactions  . Tylenol [Acetaminophen] Nausea Only    States can take Tylenol if has other pain med w/it - like Hydrocodone    Family History:  Family History  Problem Relation Age of Onset  . Hypertension Mother   . Migraines Sister   . Heart failure Brother   . Migraines Brother       Prior to Admission medications   Medication Sig Start Date End Date Taking? Authorizing Provider  acetaminophen (TYLENOL) 500 MG tablet Take 1,000 mg by mouth every 6 (six) hours as needed (pain).    Yes Historical Provider, MD  chlordiazePOXIDE (LIBRIUM) 25 MG capsule Take 1 capsule (25 mg total) by mouth 3 (three) times daily as needed for anxiety or withdrawal. 03/04/14  Yes Linwood DibblesJon Knapp, MD  cholecalciferol (VITAMIN D) 1000 UNITS tablet Take 1,000 Units by mouth daily.   Yes Historical Provider, MD  folic acid (FOLVITE) 1 MG tablet Take 1 tablet (1 mg total) by mouth daily. 10/08/12  Yes Penny Piarlando Vega, MD  gabapentin (NEURONTIN) 300 MG capsule Take 1 capsule (300 mg total) by mouth 3 (three) times daily. Patient taking differently: Take 300 mg by mouth 2 (two) times daily.  07/31/12  Yes [name], PA-C  levETIRAcetam (KEPPRA) 500 MG tablet Take 1 tablet (500 mg total) by mouth 2 (two) times daily. 05/22/14  Yes Saima  Rizwan, MD  lisinopril (PRINIVIL,ZESTRIL) 5 MG tablet Take 5 mg by mouth daily.   Yes Historical Provider, MD  magnesium oxide (MAG-OX) 400 MG tablet Take 400 mg by mouth daily.   Yes Historical Provider, MD  metoprolol tartrate (LOPRESSOR) 25 MG tablet Take 12.5 mg by mouth 2 (two) times daily.   Yes Historical Provider, MD  thiamine 100 MG tablet Take 1 tablet (100 mg total) by mouth daily. 03/04/14  Yes Huey Bienenstock, MD  HYDROcodone-acetaminophen (NORCO/VICODIN) 5-325 MG per tablet Take 1  tablet by mouth every 6 (six) hours as needed for moderate pain (pain).    Historical Provider, MD    Physical Exam: Filed Vitals:   06/14/14 2315 06/14/14 2330 06/14/14 2341 06/15/14 0007  BP: 141/99 164/97  156/98  Pulse:    70  Temp:   97.7 F (36.5 C) 98.6 F (37 C)  TempSrc:   Oral Oral  Resp:    19  Height:     (1.88 m)  Weight:    69.1 kg (152 lb 5.4 oz)  SpO2:    100%     General:  Moderately built and nourished.  Eyes: Anicteric no pallor.  ENT: No discharge from the ears eyes nose or mouth.  Neck: No mass felt. No neck rigidity.  Cardiovascular: S1 and S2 heard.  Respiratory: No rhonchi or crepitations.  Abdomen: Soft nontender bowel sounds present.  Skin: No rash.  Musculoskeletal: No edema.  Psychiatric: Appears normal.  Neurologic: Alert awake oriented to time place and person. Moves all extremities.  Labs on Admission:  Basic Metabolic Panel:  Recent Labs Lab 06/14/14 1756  NA 133*  K 2.9*  CL 93*  CO2 26  GLUCOSE 137*  BUN 17  CREATININE 2.36*  CALCIUM 8.9   Liver Function Tests:  Recent Labs Lab 06/14/14 1756  AST 147*  ALT 33  ALKPHOS 127*  BILITOT 2.2*  PROT 7.9  ALBUMIN 3.9    Recent Labs Lab 06/14/14 1756  LIPASE 21    Recent Labs Lab 06/14/14 1757  AMMONIA 60*   CBC:  Recent Labs Lab 06/14/14 1756  WBC 4.3  NEUTROABS 2.1  HGB 14.3  HCT 40.4  MCV 101.3*  PLT 53*   Cardiac Enzymes:  Recent Labs Lab 06/14/14 1756  TROPONINI <0.03    BNP (last 3 results) No results for input(s): PROBNP in the last 8760 hours. CBG: No results for input(s): GLUCAP in the last 168 hours.  Radiological Exams on Admission: Dg Chest Portable 1 View  06/14/2014   CLINICAL DATA:  Syncopal episodes for several days. Increased weakness and hypotension. History of hypertension and seizures. Initial encounter.  EXAM: PORTABLE CHEST - 1 VIEW  COMPARISON:  Radiographs 05/20/2014 and 03/03/2014.  FINDINGS: 1818 hr. Two  views were obtained. Stable heart size and mediastinal contours. The lungs are clear. There is no pleural effusion or pneumothorax. The osseous structures appear unchanged.  IMPRESSION: Stable chest.  No acute cardiopulmonary process.   Electronically Signed   By: Roxy Horseman M.D.   On: 06/14/2014 18:35    EKG: Independently reviewed. Normal sinus rhythm with QTC of 516 ms.  Assessment/Plan Principal Problem:   Syncope Active Problems:   AKI (acute kidney injury)   Hypokalemia   Alcohol abuse   Thrombocytopenia   Dehydration   Hypotension   1. Syncope - cause not clear but patient was hypotensive at the site. Patient's EKG also shows prolonged QTC. Also patient has hypokalemia. Patient has received  potassium orally and IV and I have also place patient on normal saline infusion with potassium. Check magnesium levels and closely follow metabolic panel. Check orthostatics. Since patient also had a convulsive episode after the loss of consciousness. Did discuss with on-call neurologist Dr. Cyril Mourning who at this time advised to give 1 g of Keppra loading dose and continue his home dose. Check 2-D echo. 2. Acute renal failure - at this time we will hold off lisinopril and gently hydrate. May be due to poor oral intake. Continue hydration and closely follow metabolic panel. 3. Alcoholism - patient has been placed on Ativan withdrawal protocol with thiamine. Patient advised to quit alcohol abuse. 4. Hypokalemia - see #1. 5. Hypertension - since patient was initially hypotensive and also patient had acute renal failure holding off lisinopril now. Continue beta blocker. Closely follow blood pressure trends. 6. Chronic thrombocytopenia - probably related to alcoholism. Not sure patient has liver cirrhosis. 7. Elevated LFTs - probably secondary to alcoholism. Follow LFTs. Check acute hepatitis panel.   DVT Prophylaxis SCDs due to chronic thrombocytopenia.  Code Status: Full code.  Family  Communication: None.  Disposition Plan: Admit to inpatient.    Samarie Pinder N. [facility] Pager [phone].  If 7PM-7AM, please contact night-coverage [email or web address] Password [facility] 06/15/2014, 12:53 AM

## 2014-06-15 NOTE — Progress Notes (Signed)
CARE MANAGEMENT NOTE 06/15/2014  Patient:  Dennis Zhang,Dennis Zhang   Account Number:  [id number]  Date Initiated:  06/15/2014  Documentation initiated by:  Trinna BalloonMcGIBBONEY,COOKIE Saben Donigan  Subjective/Objective Assessment:   pt admitted with cco loss of consciousness, syncope, ETOH     Action/Plan:   from home   Anticipated DC Date:  06/17/2014   Anticipated DC Plan:  HOME/SELF CARE      DC Planning Services  CM consult      Choice offered to / List presented to:             Status of service:  In process, will continue to follow Medicare Important Message given?   (If response is "NO", the following Medicare IM given date fields will be blank) Date Medicare IM given:   Medicare IM given by:   Date Additional Medicare IM given:   Additional Medicare IM given by:    Discharge Disposition:    Per UR Regulation:  Reviewed for med. necessity/level of care/duration of stay  If discussed at Long Length of Stay Meetings, dates discussed:    Comments:  06/15/14 MMcGibboney, RN, BSN Chart reviewed. [facility] [place] called [phone] spoke with CM April, RN.

## 2014-06-15 NOTE — Evaluation (Signed)
Physical Therapy Evaluation and Discharge Patient Details Name: Kiel Cockerell MRN: [id number] DOB: [birth year] Today's Date: 06/15/2014   History of Present Illness  Tina Temme is a 59 y.o. male with a history of hypertension, alcoholism, seizure disorder admitted with syncope and dehydration.  Clinical Impression  Pt ambulated 600' with cane with S progressing to MOD I.  No LOB and no dizziness.  No skilled PT needs identified and will d/c from PT services.    Follow Up Recommendations No PT follow up    Equipment Recommendations  None recommended by PT    Recommendations for Other Services       Precautions / Restrictions        Mobility  Bed Mobility Overal bed mobility: Modified Independent                Transfers Overall transfer level: Modified independent                  Ambulation/Gait Ambulation/Gait assistance: Modified independent (Device/Increase time);Supervision Ambulation Distance (Feet): 600 Feet Assistive device: Straight cane       General Gait Details: Pt able to take side steps and reach out of BOS during functional activities in room with no LOB. No LOB with gait and no reports of dizziness.  Stairs            Wheelchair Mobility    Modified Rankin (Stroke Patients Only)       Balance Overall balance assessment: No apparent balance deficits (not formally assessed)                                           Pertinent Vitals/Pain Pain Assessment: 0-10 Pain Score: 5  Pain Location: back, head, and neck Pain Intervention(s): Premedicated before session    Home Living Family/patient expects to be discharged to:: Private residence Living Arrangements: Non-relatives/Friends (roommate is in w/c)   Type of Home: Apartment Home Access: Level entry     Home Layout: One level Home Equipment: Cane - single point;Walker - 2 wheels      Prior Function Level of Independence: Independent with  assistive device(s)         Comments: Amb with cane.  Occasional use of RW.     Hand Dominance   Dominant Hand: Right    Extremity/Trunk Assessment   Upper Extremity Assessment: Overall WFL for tasks assessed           Lower Extremity Assessment: Overall WFL for tasks assessed      Cervical / Trunk Assessment: Normal  Communication   Communication: No difficulties  Cognition Arousal/Alertness: Awake/alert Behavior During Therapy: WFL for tasks assessed/performed Overall Cognitive Status: Within Functional Limits for tasks assessed                      General Comments      Exercises        Assessment/Plan    PT Assessment Patent does not need any further PT services  PT Diagnosis Difficulty walking   PT Problem List    PT Treatment Interventions     PT Goals (Current goals can be found in the Care Plan section) Acute Rehab PT Goals PT Goal Formulation: All assessment and education complete, DC therapy    Frequency     Barriers to discharge        Co-evaluation  End of Session Equipment Utilized During Treatment: Gait belt Activity Tolerance: Patient tolerated treatment well Patient left: in chair Nurse Communication: Mobility status         Time: [id number] PT Time Calculation (min) (ACUTE ONLY): 18 min   Charges:   PT Evaluation $Initial PT Evaluation Tier I: 1 Procedure     PT G Codes:        Madisynn Plair LUBECK 06/15/2014, 11:34 AM

## 2014-06-16 MED ORDER — ENSURE COMPLETE PO LIQD: 237.0000 mL | Freq: Three times a day (TID) | ORAL | Status: DC

## 2014-06-16 MED ORDER — HYDROCODONE-ACETAMINOPHEN 5-325 MG PO TABS: 1.0000 | ORAL_TABLET | Freq: Four times a day (QID) | ORAL | Status: DC | PRN

## 2014-06-16 NOTE — Clinical Documentation Improvement (Signed)
The impression of the medical states pt with thrombocytopenia.   Clarification Needed  Please clarify if the thrombocytopenia ins setting of abnormal WBCs, RBCs, & PLTS can be further specified as one of the diagnoses listed below and document in pn or d/c summary.  Clinical Indicators   Possible Clinical Conditions?  Acute Blood Loss Anemia Pancytopenia Aplastic anemia Precipitous drop in Hematocrit Acute on chronic blood loss anemia  Other Condition  Cannot Clinically Determine   Supporting Information: Signs and Symptoms: Diagnostics: Component      WBC RBC  Latest Ref Rng      4.0 - 10.5 K/uL 4.22 - 5.81 MIL/uL  06/15/2014      3.6 (L) 2.98 (L)   Component      Platelets  Latest Ref Rng      150 - 400 K/uL  06/15/2014      35 (L)   Treatments: Monitoring   folic acid (FOLVITE) tablet 1 mg  multivitamin with minerals tablet 1 tablet  levETIRAcetam (KEPPRA) tablet 500 mg albuterol (PROVENTIL) (2.5 MG/3ML) 0.083% nebulizer solution 2.5 mg   Thank You, Enis SlipperLolita J Veto Macqueen ,RN Clinical Documentation Specialist:  [phone]  [facility]- Health Information Management

## 2014-06-16 NOTE — Progress Notes (Signed)
Patient's blood pressure appears to be trending up.  Will continue to monitor pt and treat with prn Hydralazine as needed.  Manson PasseyBrown, Wilda Wetherell Cherie

## 2014-06-16 NOTE — Discharge Summary (Signed)
Physician Discharge Summary  Dennis MillinLevonda Zhang EAV:[id number] DOB: 12/21/1955 DOA: 06/14/2014  PCP: Jyl HeinzARMOUR, ROSS B, MD  Admit date: 06/14/2014 Discharge date: 06/16/2014  Time spent: 35 minutes  Recommendations for Outpatient Follow-up:  1. Cmet in 1 week + CBC 2. Stop drinking 3. NO ACE  Inhibitor-BP trends monitor [facility]   Discharge Diagnoses:  Principal Problem:   Syncope Active Problems:   AKI (acute kidney injury)   Hypokalemia   Alcohol abuse   Thrombocytopenia   Dehydration   Hypotension   Fall   Arterial hypotension   Weakness   Discharge Condition: good  Diet recommendation: regular-HH if compliant   Filed Weights   06/15/14 0007  Weight: 69.1 kg (152 lb 5.4 oz)    History of present illness:   59 y/o ? known ETOH with resultant TCP/Alcoholic liver disease and resultant Aplastic anemia from ETOH, Chronic pancreatitis on MR abd 10/03/12 + Fatty liver, Multiple admission 05/22/14 & 02/2014 for syncopal episodes 2/2 to orthostasis, prior episodes AKI with new baseline 02/2014 Ck-Glt Equation CKD stg 4, admitted early 06/15/14 c orthostasis, dizzyness, poor po intake and New AKI His AKI resolved with IV saline as did the hypokalemia by 06/16/14 He was orthostatic on admit from the ED, but this resolved and he ambulated around the unit a couple of x's I have storngly encouraged ETOH cessation which he states he drinks "becasue i have back pain" I have refilled a small amount of Norco and explained the [facility] will need to manage his other Chronic issues   Discharge Exam: Filed Vitals:   06/16/14 0513  BP: 147/75  Pulse: 77  Temp: 97.8 F (36.6 C)  Resp: 18    General: eomi, ncat Cardiovascular:  s1 s2 no m/r/g-sinus on monitors Respiratory: clear no added sound  Discharge Instructions   Discharge Instructions    Diet - low sodium heart healthy    Complete by:  As directed      Discharge instructions    Complete by:  As directed   Stop  drinking [facility]Am[facility] and adjust blood pressure meds-DO NOT TAKE ACE/LISINOPRIL anymore.  It can cause some kidney problems Continue norco-[place] [facility] [place] [facility] [place] [facility]     Increase activity slowly    Complete by:  As directed           Current Discharge Medication List    START taking these medications   Details  feeding supplement, ENSURE COMPLETE, (ENSURE COMPLETE) LIQD Take 237 mLs by mouth 3 (three) times daily between meals.      CONTINUE these medications which have CHANGED   Details  HYDROcodone-acetaminophen (NORCO/VICODIN) 5-325 MG per tablet Take 1-2 tablets by mouth every 6 (six) hours as needed for severe pain. Qty: 30 tablet, Refills: 0      CONTINUE these medications which have NOT CHANGED   Details  acetaminophen (TYLENOL) 500 MG tablet Take 1,000 mg by mouth every 6 (six) hours as needed (pain).     cholecalciferol (VITAMIN D) 1000 UNITS tablet Take 1,000 Units by mouth daily.    folic acid (FOLVITE) 1 MG tablet Take 1 tablet (1 mg total) by mouth daily. Qty: 30 tablet, Refills: 0    gabapentin (NEURONTIN) 300 MG capsule Take 1 capsule (300 mg total) by mouth 3 (three) times daily. Qty: 45 capsule, Refills: 0    levETIRAcetam (KEPPRA) 500 MG tablet Take 1 tablet (500 mg total) by mouth 2 (  two) times daily. Qty: 60 tablet, Refills: 0    metoprolol tartrate (LOPRESSOR) 25 MG tablet Take 12.5 mg by mouth 2 (two) times daily.    thiamine 100 MG tablet Take 1 tablet (100 mg total) by mouth daily. Qty: 30 tablet, Refills: 0      STOP taking these medications     chlordiazePOXIDE (LIBRIUM) 25 MG capsule      lisinopril (PRINIVIL,ZESTRIL) 5 MG tablet      magnesium oxide (MAG-OX) 400 MG tablet        Allergies  Allergen Reactions  . Ace Inhibitors     Kidney injury 05/2014-do not Rx  . Tylenol [Acetaminophen] Nausea Only    States can take Tylenol if has other pain med w/it - like Hydrocodone      The results of  significant diagnostics from this hospitalization (including imaging, microbiology, ancillary and laboratory) are listed below for reference.    Significant Diagnostic Studies: Ct Head Wo Contrast  06/15/2014   CLINICAL DATA:  Seizure and fall this morning, striking left side of forehead. History of previous falls and syncope.  EXAM: CT HEAD WITHOUT CONTRAST  TECHNIQUE: Contiguous axial images were obtained from the base of the skull through the vertex without intravenous contrast.  COMPARISON:  03/03/2014  FINDINGS: Mild diffuse cerebral atrophy. No ventricular dilatation. No mass effect or midline shift. No abnormal extra-axial fluid collections. Gray-white matter junctions are distinct. Basal cisterns are not effaced. No evidence of acute intracranial hemorrhage. No depressed skull fractures. Mucosal thickening in the paranasal sinuses with small air-fluid level in the right maxillary antrum. This is likely inflammatory. Old nasal bone fractures. Old deformity of the right medial orbital wall. Vascular calcifications.  IMPRESSION: No acute intracranial abnormalities.  Mild chronic atrophy.   Electronically Signed   By: Burman Nieves M.D.   On: 06/15/2014 01:33   US Renal  05/20/2014   CLINICAL DATA:  Acute renal failure  EXAM: RENAL/URINARY TRACT ULTRASOUND COMPLETE  COMPARISON:  None.  FINDINGS: Right Kidney:  Length: 10.7 cm. Echogenicity within normal limits. No mass or hydronephrosis visualized.  Left Kidney:  Length: 11.2 cm. Echogenicity within normal limits. No mass or hydronephrosis visualized.  Bladder:  Appears normal for degree of bladder distention.  IMPRESSION: Normal renal ultrasound.   Electronically Signed   By: Elige Ko   On: 05/20/2014 19:05   Dg Chest Portable 1 View  06/14/2014   CLINICAL DATA:  Syncopal episodes for several days. Increased weakness and hypotension. History of hypertension and seizures. Initial encounter.  EXAM: PORTABLE CHEST - 1 VIEW  COMPARISON:   Radiographs 05/20/2014 and 03/03/2014.  FINDINGS: 1818 hr. Two views were obtained. Stable heart size and mediastinal contours. The lungs are clear. There is no pleural effusion or pneumothorax. The osseous structures appear unchanged.  IMPRESSION: Stable chest.  No acute cardiopulmonary process.   Electronically Signed   By: Roxy Horseman M.D.   On: 06/14/2014 18:35   Dg Chest Port 1 View  05/20/2014   CLINICAL DATA:  Dizziness since this morning.  EXAM: PORTABLE CHEST - 1 VIEW  COMPARISON:  03/03/2014  FINDINGS: There is hyperinflation of the lungs compatible with COPD. Scarring in the left base and apices. Heart is normal size. No confluent opacities or effusions.  IMPRESSION: COPD/chronic changes.  No active disease.   Electronically Signed   By: Charlett Nose M.D.   On: 05/20/2014 13:24   Dg Abd 2 Views  05/20/2014   CLINICAL DATA:  Nausea.  EXAM: ABDOMEN - 2 VIEW  COMPARISON:  None.  FINDINGS: The bowel gas pattern is normal. There is no evidence of free air. No radio-opaque calculi or other significant radiographic abnormality is seen. Degenerative change of both hip joints is noted.  IMPRESSION: No evidence of bowel obstruction or ileus.   Electronically Signed   By: Roque Lias M.D.   On: 05/20/2014 14:14    Microbiology: Recent Results (from the past 240 hour(s))  Blood culture (routine x 2)     Status: None (Preliminary result)   Collection Time: 06/14/14  5:57 PM  Result Value Ref Range Status   Specimen Description BLOOD RIGHT ANTECUBITAL  Final   Special Requests BOTTLES DRAWN AEROBIC AND ANAEROBIC 3 CC EACH  Final   Culture   Final           BLOOD CULTURE RECEIVED NO GROWTH TO DATE CULTURE WILL BE HELD FOR 5 DAYS BEFORE ISSUING A FINAL NEGATIVE REPORT Performed at [facility]    Report Status PENDING  Incomplete  Blood culture (routine x 2)     Status: None (Preliminary result)   Collection Time: 06/14/14  5:57 PM  Result Value Ref Range Status   Specimen Description  BLOOD LEFT HAND  Final   Special Requests BOTTLES DRAWN AEROBIC ONLY 3 CC  Final   Culture   Final           BLOOD CULTURE RECEIVED NO GROWTH TO DATE CULTURE WILL BE HELD FOR 5 DAYS BEFORE ISSUING A FINAL NEGATIVE REPORT Performed at [facility]    Report Status PENDING  Incomplete  Urine culture     Status: None   Collection Time: 06/14/14  6:47 PM  Result Value Ref Range Status   Specimen Description URINE, CATHETERIZED  Final   Special Requests NONE  Final   Colony Count NO GROWTH Performed at [facility]   Final   Culture NO GROWTH Performed at [facility]   Final   Report Status 06/15/2014 FINAL  Final     Labs: Basic Metabolic Panel:  Recent Labs Lab 06/14/14 1756 06/15/14 0450 06/15/14 0455  NA 133*  --  138  K 2.9*  --  3.7  CL 93*  --  105  CO2 26  --  26  GLUCOSE 137*  --  118*  BUN 17  --  16  CREATININE 2.36*  --  1.28  CALCIUM 8.9  --  7.8*  MG  --  1.1*  --    Liver Function Tests:  Recent Labs Lab 06/14/14 1756 06/15/14 0455  AST 147* 90*  ALT 33 24  ALKPHOS 127* 102  BILITOT 2.2* 1.8*  PROT 7.9 5.7*  ALBUMIN 3.9 3.0*    Recent Labs Lab 06/14/14 1756  LIPASE 21    Recent Labs Lab 06/14/14 1757 06/15/14 0450  AMMONIA 60* 68*   CBC:  Recent Labs Lab 06/14/14 1756 06/15/14 0455  WBC 4.3 3.6*  NEUTROABS 2.1 1.8  HGB 14.3 10.5*  HCT 40.4 30.3*  MCV 101.3* 101.7*  PLT 53* 35*   Cardiac Enzymes:  Recent Labs Lab 06/14/14 1756 06/15/14 0450  CKTOTAL  --  226  TROPONINI <0.03 <0.03   BNP: BNP (last 3 results) No results for input(s): PROBNP in the last 8760 hours. CBG: No results for input(s): GLUCAP in the last 168 hours.     SignedRhetta Mura  [facility] 06/16/2014, 8:04 AM

## 2014-06-20 LAB — CULTURE, BLOOD (ROUTINE X 2)
Culture: NO GROWTH
Culture: NO GROWTH

## 2015-01-15 IMAGING — CR DG HIP (WITH OR WITHOUT PELVIS) 2-3V*L*
4 series · 4 of 4 positions shown · non-contrast
Comparison: None

CLINICAL DATA: Worsening hip pain over years, remote fall from a
truck in [REDACTED] injuring hip

LEFT HIP - COMPLETE 2+ VIEW

[t pelvis a.p.]
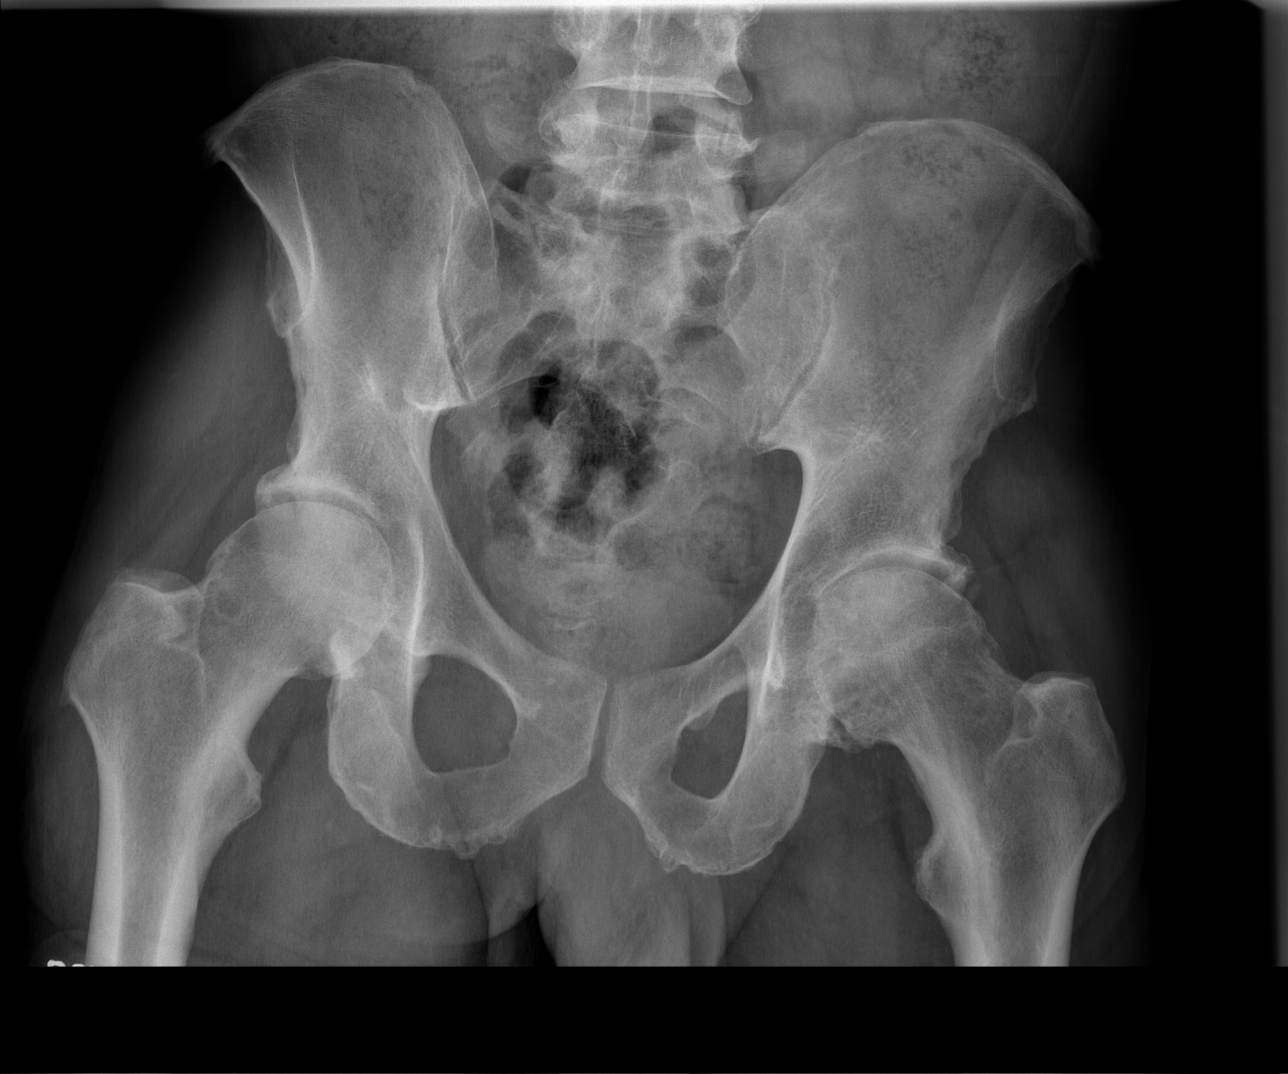

[t hip ap left]
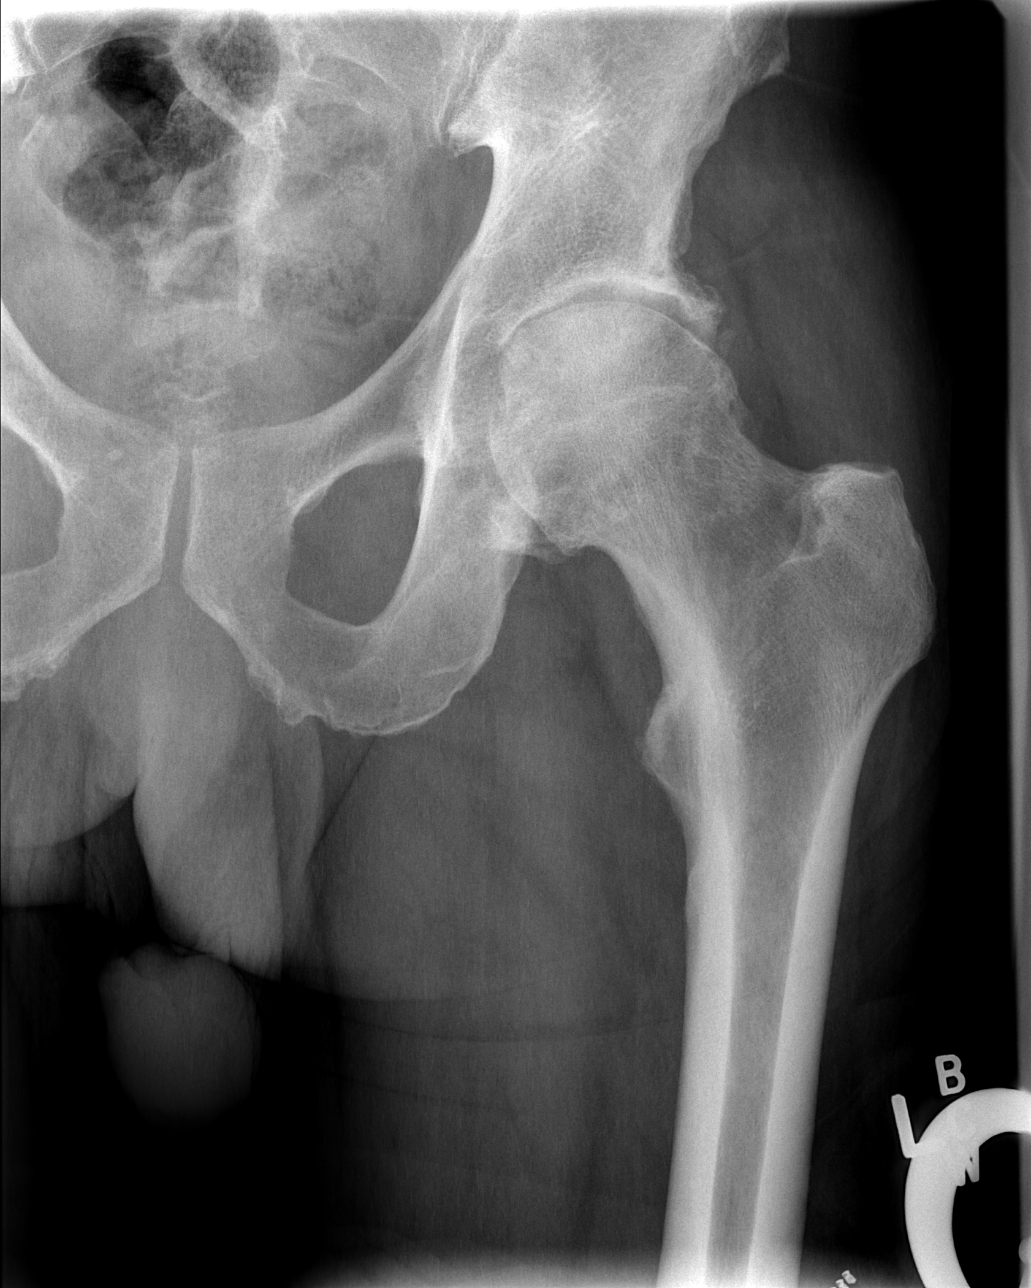

[t hip frog leg left (1 of 2)]
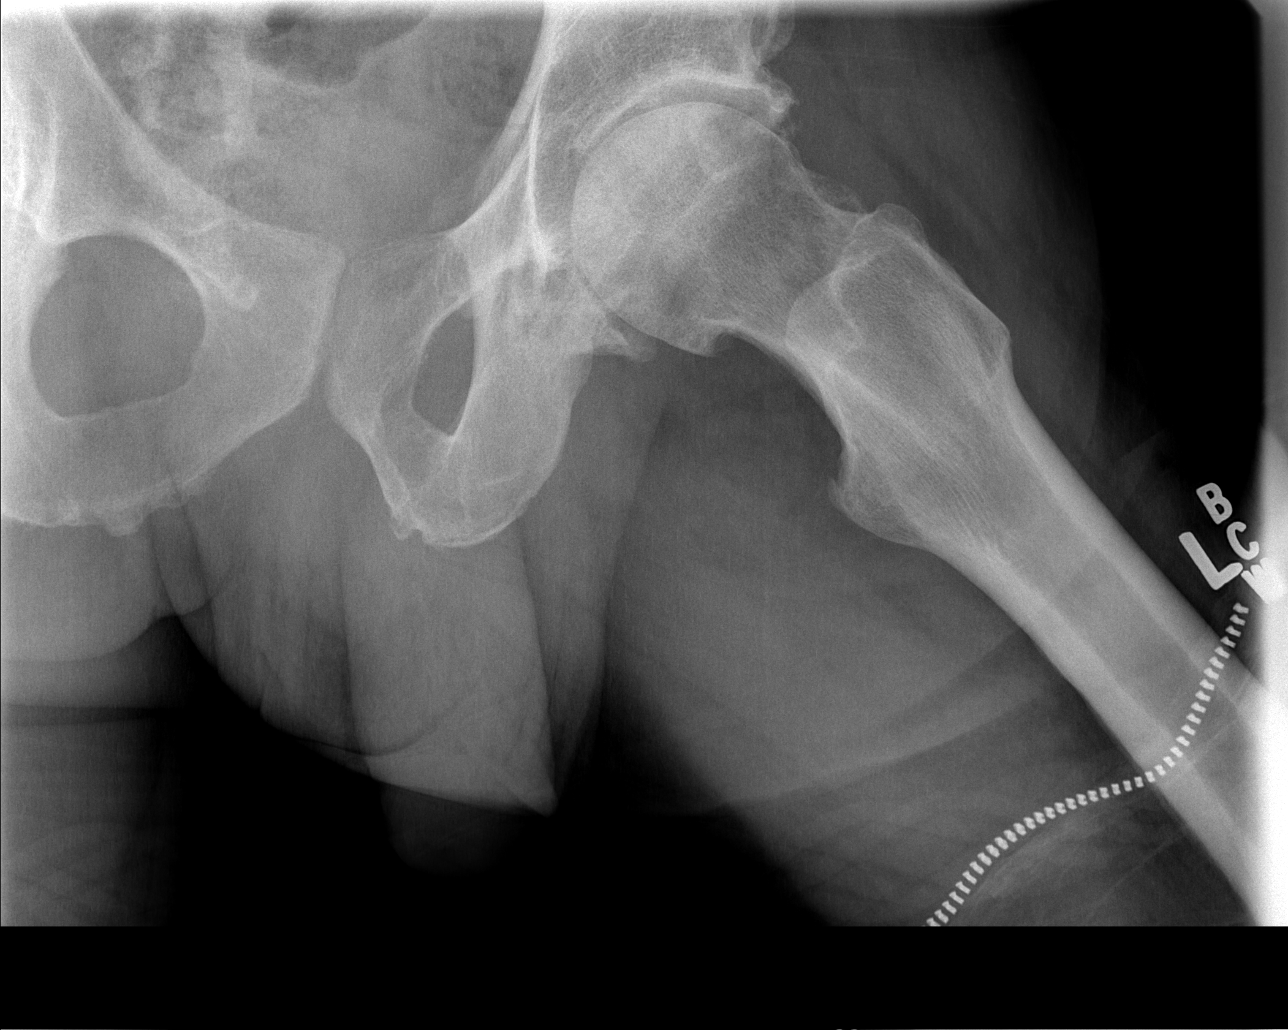

[t hip frog leg left (2 of 2)]
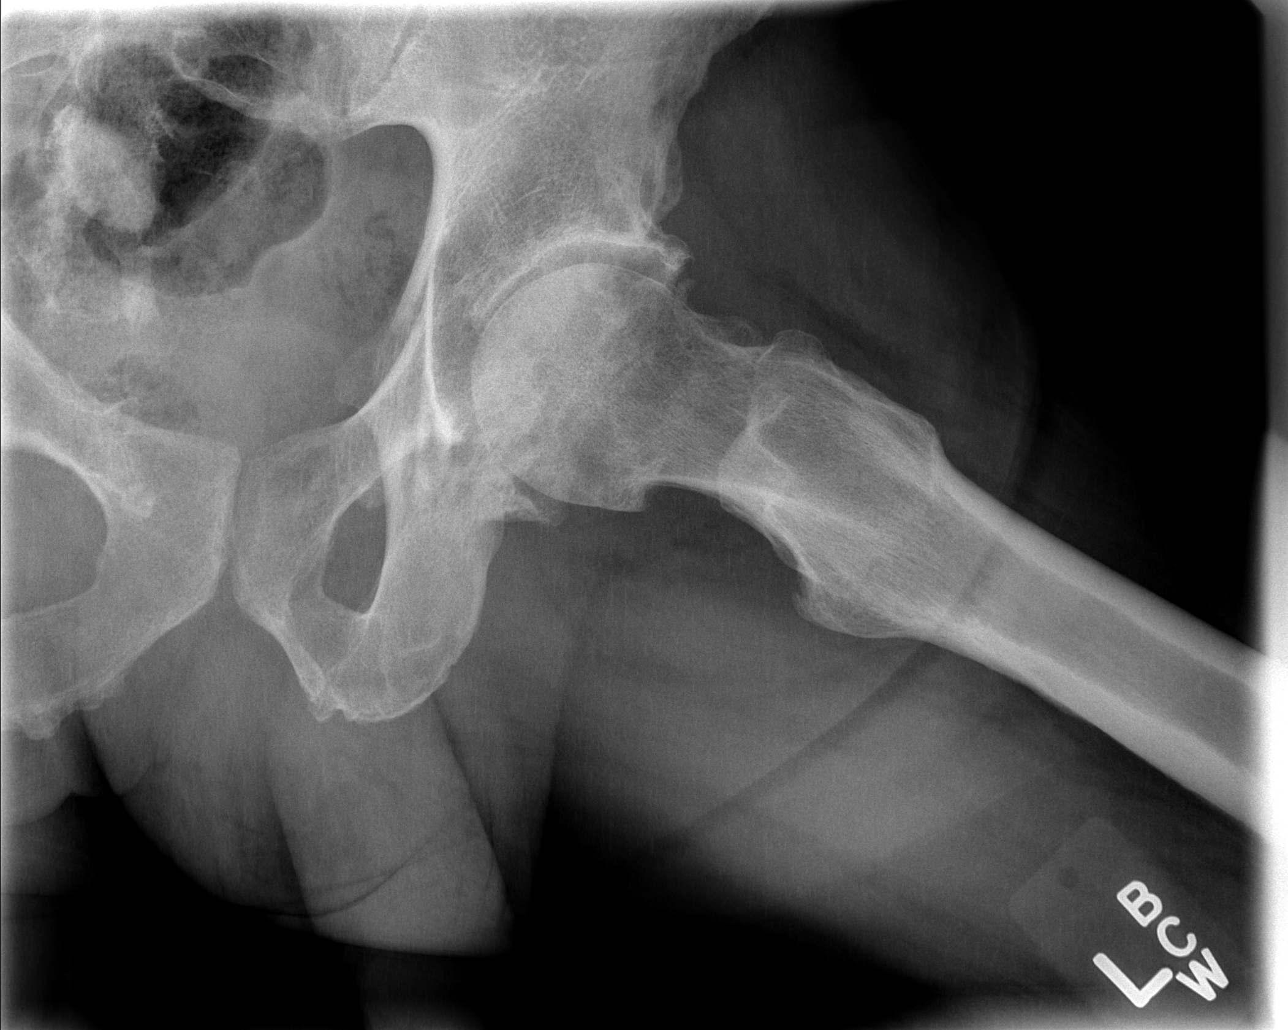

[4 of 4 positions shown; findings below may reference images not displayed]

FINDINGS: Osseous mineralization grossly normal for technique.
Symmetric preserved SI joints.
Bilateral hip joint space narrowing and femoral head spur formation
compatible with osteoarthritic changes, greater on left.
No acute fracture, dislocation, or bone destruction.
Soft tissues unremarkable.
IMPRESSION: Osteoarthritic changes of the hip joints bilaterally, greater on
the left.

## 2015-04-30 ENCOUNTER — Encounter (HOSPITAL_COMMUNITY): Payer: Self-pay | Admitting: Emergency Medicine

## 2015-04-30 ENCOUNTER — Emergency Department (HOSPITAL_COMMUNITY)
Admission: EM | Admit: 2015-04-30 | Discharge: 2015-04-30 | Disposition: A | Discharge status: Home / Self Care | Payer: Medicaid Other | Attending: Emergency Medicine | Admitting: Emergency Medicine

## 2015-04-30 ENCOUNTER — Emergency Department (HOSPITAL_COMMUNITY): Payer: Medicaid Other

## 2015-04-30 DIAGNOSIS — R0981 Nasal congestion: Secondary | ICD-10-CM | POA: Insufficient documentation

## 2015-04-30 DIAGNOSIS — Z79899 Other long term (current) drug therapy: Secondary | ICD-10-CM | POA: Diagnosis not present

## 2015-04-30 DIAGNOSIS — R569 Unspecified convulsions: Secondary | ICD-10-CM | POA: Insufficient documentation

## 2015-04-30 DIAGNOSIS — I10 Essential (primary) hypertension: Secondary | ICD-10-CM | POA: Diagnosis not present

## 2015-04-30 DIAGNOSIS — R509 Fever, unspecified: Secondary | ICD-10-CM | POA: Diagnosis not present

## 2015-04-30 DIAGNOSIS — F17210 Nicotine dependence, cigarettes, uncomplicated: Secondary | ICD-10-CM | POA: Diagnosis not present

## 2015-04-30 DIAGNOSIS — E876 Hypokalemia: Secondary | ICD-10-CM | POA: Diagnosis not present

## 2015-04-30 DIAGNOSIS — M1990 Unspecified osteoarthritis, unspecified site: Secondary | ICD-10-CM | POA: Diagnosis not present

## 2015-04-30 DIAGNOSIS — E119 Type 2 diabetes mellitus without complications: Secondary | ICD-10-CM | POA: Insufficient documentation

## 2015-04-30 DIAGNOSIS — G8929 Other chronic pain: Secondary | ICD-10-CM | POA: Diagnosis not present

## 2015-04-30 DIAGNOSIS — G40909 Epilepsy, unspecified, not intractable, without status epilepticus: Secondary | ICD-10-CM

## 2015-04-30 DIAGNOSIS — R05 Cough: Secondary | ICD-10-CM | POA: Insufficient documentation

## 2015-04-30 LAB — CBC
HCT: 33.4 % — ABNORMAL LOW (ref 39.0–52.0)
HEMOGLOBIN: 11.5 g/dL — AB (ref 13.0–17.0)
MCH: 34.8 pg — ABNORMAL HIGH (ref 26.0–34.0)
MCHC: 34.4 g/dL (ref 30.0–36.0)
MCV: 101.2 fL — AB (ref 78.0–100.0)
Platelets: 78 10*3/uL — ABNORMAL LOW (ref 150–400)
RBC: 3.3 MIL/uL — AB (ref 4.22–5.81)
RDW: 14.4 % (ref 11.5–15.5)
WBC: 3.3 10*3/uL — ABNORMAL LOW (ref 4.0–10.5)

## 2015-04-30 LAB — BASIC METABOLIC PANEL
Anion gap: 8 (ref 5–15)
BUN: 6 mg/dL (ref 6–20)
CHLORIDE: 104 mmol/L (ref 101–111)
CO2: 28 mmol/L (ref 22–32)
CREATININE: 0.86 mg/dL (ref 0.61–1.24)
Calcium: 8 mg/dL — ABNORMAL LOW (ref 8.9–10.3)
GFR calc Af Amer: >60 mL/min (ref >60)
GFR calc non Af Amer: >60 mL/min (ref >60)
GLUCOSE: 117 mg/dL — AB (ref 65–99)
POTASSIUM: 2.9 mmol/L — AB (ref 3.5–5.1)
Sodium: 140 mmol/L (ref 135–145)

## 2015-04-30 MED ORDER — LEVETIRACETAM 750 MG PO TABS
1500.0000 mg | ORAL_TABLET | Freq: Once | ORAL | Status: AC
  Administered 2015-04-30: 1500 mg via ORAL
  Filled 2015-04-30: qty 2

## 2015-04-30 MED ORDER — ACETAMINOPHEN 500 MG PO TABS
1000.0000 mg | ORAL_TABLET | Freq: Once | ORAL | Status: DC
  Filled 2015-04-30: qty 2

## 2015-04-30 MED ORDER — MAGNESIUM OXIDE 400 (241.3 MG) MG PO TABS
800.0000 mg | ORAL_TABLET | Freq: Once | ORAL | Status: AC
  Administered 2015-04-30: 800 mg via ORAL
  Filled 2015-04-30: qty 2

## 2015-04-30 MED ORDER — POTASSIUM CHLORIDE CRYS ER 20 MEQ PO TBCR
80.0000 meq | EXTENDED_RELEASE_TABLET | Freq: Once | ORAL | Status: AC
  Administered 2015-04-30: 80 meq via ORAL
  Filled 2015-04-30: qty 4

## 2015-04-30 MED ORDER — KETOROLAC TROMETHAMINE 30 MG/ML IJ SOLN
30.0000 mg | Freq: Once | INTRAMUSCULAR | Status: AC
  Administered 2015-04-30: 30 mg via INTRAMUSCULAR
  Filled 2015-04-30: qty 1

## 2015-04-30 MED ORDER — LEVETIRACETAM 500 MG PO TABS: 500.0000 mg | ORAL_TABLET | Freq: Two times a day (BID) | ORAL | Status: DC

## 2015-04-30 MED ORDER — ACETAMINOPHEN 500 MG PO TABS: 1000.0000 mg | ORAL_TABLET | Freq: Once | ORAL | Status: DC

## 2015-04-30 NOTE — ED Notes (Signed)
[facility], 3 days of URI s/s, weakness, A/O on arrival and in NAD

## 2015-04-30 NOTE — Discharge Instructions (Signed)

## 2015-04-30 NOTE — ED Provider Notes (Signed)
CSN: [id number]     Arrival date & time 04/30/15  1609 History   First MD Initiated Contact with Patient 04/30/15 1611     Chief Complaint  Patient presents with  . URI     (Consider location/radiation/quality/duration/timing/severity/associated sxs/prior Treatment) Patient is a 59 y.o. male presenting with URI. The history is provided by the patient.  URI Presenting symptoms: congestion and cough   Presenting symptoms: no fever   Severity:  Moderate Onset quality:  Gradual Duration:  2 days Timing:  Constant Progression:  Worsening Chronicity:  New Relieved by:  Nothing Worsened by:  Nothing tried Ineffective treatments:  None tried Associated symptoms: myalgias   Associated symptoms: no arthralgias and no headaches     59 yo M with a chief complaint of cough, congestion, myalgias.  This started a couple days ago, worsening.  Subjective fevers and chills.  Denies sob, chest pain.  Also feeling like he might be having seizures.  Patients normal seizure he does not lose consciousness but just shakes.  Off his meds for last three months as he has been in [facility] taking care of his mother who was sick.  Patient feels that he needs his norcos as he has been without them for his chronic back pain as well.   Past Medical History  Diagnosis Date  . Chronic pain   . Arthritis   . Hypertension   . Chronic pain   . Diabetes mellitus without complication (HCC)     borderline  . Hyperlipidemia   . Seizures [facility])    Past Surgical History  Procedure Laterality Date  . Abdominal surgery    . Hemorroidectomy    . Appendectomy     Family History  Problem Relation Age of Onset  . Hypertension Mother   . Migraines Sister   . Heart failure Brother   . Migraines Brother    Social History  Substance Use Topics  . Smoking status: Current Every Day Smoker -- 0.50 packs/day for 30 years    Types: Cigarettes  . Smokeless tobacco: Never Used  . Alcohol Use: Yes     Comment: occ     Review of Systems  Constitutional: Negative for fever and chills.  HENT: Positive for congestion. Negative for facial swelling.   Eyes: Negative for discharge and visual disturbance.  Respiratory: Positive for cough. Negative for shortness of breath.   Cardiovascular: Negative for chest pain and palpitations.  Gastrointestinal: Negative for vomiting, abdominal pain and diarrhea.  Musculoskeletal: Positive for myalgias. Negative for arthralgias.  Skin: Negative for color change and rash.  Neurological: Negative for tremors, syncope and headaches.  Psychiatric/Behavioral: Negative for confusion and dysphoric mood.      Allergies  Ace inhibitors and Tylenol  Home Medications   Prior to Admission medications   Medication Sig Start Date End Date Taking? Authorizing Provider  acetaminophen (TYLENOL) 500 MG tablet Take 1,000 mg by mouth every 6 (six) hours as needed (pain).     Historical Provider, MD  cholecalciferol (VITAMIN D) 1000 UNITS tablet Take 1,000 Units by mouth daily.    Historical Provider, MD  feeding supplement, ENSURE COMPLETE, (ENSURE COMPLETE) LIQD Take 237 mLs by mouth 3 (three) times daily between meals. 06/16/14   Rhetta Mura, MD  folic acid (FOLVITE) 1 MG tablet Take 1 tablet (1 mg total) by mouth daily. 10/08/12   Penny Pia, MD  gabapentin (NEURONTIN) 300 MG capsule Take 1 capsule (300 mg total) by mouth 3 (three) times daily. Patient taking  differently: Take 300 mg by mouth 2 (two) times daily.  07/31/12   Ruby Cola, PA-C  HYDROcodone-acetaminophen (NORCO/VICODIN) 5-325 MG per tablet Take 1-2 tablets by mouth every 6 (six) hours as needed for severe pain. 06/16/14   Rhetta Mura, MD  levETIRAcetam (KEPPRA) 500 MG tablet Take 1 tablet (500 mg total) by mouth 2 (two) times daily. 04/30/15   Melene Plan, DO  metoprolol tartrate (LOPRESSOR) 25 MG tablet Take 12.5 mg by mouth 2 (two) times daily.    Historical Provider, MD  thiamine 100 MG tablet  Take 1 tablet (100 mg total) by mouth daily. 03/04/14   Leana Roe Elgergawy, MD   BP 133/77 mmHg  Pulse 77  Temp(Src) 97.6 F (36.4 C) (Oral)  Resp 16  Wt 152 lb (68.947 kg)  SpO2 98% Physical Exam  Constitutional: He is oriented to person, place, and time. He appears well-developed and well-nourished.  HENT:  Head: Normocephalic and atraumatic.  Swollen turbinates, posterior nasal drip  Eyes: EOM are normal. Pupils are equal, round, and reactive to light.  Neck: Normal range of motion. Neck supple. No JVD present.  Cardiovascular: Normal rate and regular rhythm.  Exam reveals no gallop and no friction rub.   No murmur heard. Pulmonary/Chest: No respiratory distress. He has no wheezes.  Abdominal: He exhibits no distension. There is no tenderness. There is no rebound and no guarding.  Musculoskeletal: Normal range of motion.  Neurological: He is alert and oriented to person, place, and time.  Skin: No rash noted. No pallor.  Psychiatric: He has a normal mood and affect. His behavior is normal.  Nursing note and vitals reviewed.   ED Course  Procedures (including critical care time) Labs Review Labs Reviewed  CBC - Abnormal; Notable for the following:    WBC 3.3 (*)    RBC 3.30 (*)    Hemoglobin 11.5 (*)    HCT 33.4 (*)    MCV 101.2 (*)    MCH 34.8 (*)    Platelets 78 (*)    All other components within normal limits  BASIC METABOLIC PANEL - Abnormal; Notable for the following:    Potassium 2.9 (*)    Glucose, Bld 117 (*)    Calcium 8.0 (*)    All other components within normal limits    Imaging Review Dg Chest 2 View  04/30/2015  CLINICAL DATA:  Syncope.  Nausea and vomiting. EXAM: CHEST  2 VIEW COMPARISON:  06/14/2014 FINDINGS: Normal heart size. Small bilateral pleural effusions, noted left greater than right. No interstitial edema. No airspace consolidation. Spondylosis identified within the thoracic spine. IMPRESSION: 1. Small bilateral pleural effusions noted left  greater than right. Electronically Signed   By: Signa Kell M.D.   On: 04/30/2015 17:09   I have personally reviewed and evaluated these images and lab results as part of my medical decision-making.   EKG Interpretation   Date/Time:  Friday April 30 2015 17:05:35 EST Ventricular Rate:  70 PR Interval:  174 QRS Duration: 100 QT Interval:  464 QTC Calculation: 501 R Axis:   -177 Text Interpretation:  Sinus rhythm Baseline wander No significant change  since last tracing Confirmed by Erianna Jolly MD, Reuel Boom ([id number]) on 04/30/2015  5:23:33 PM      MDM   Final diagnoses:  Hypokalemia  Seizure disorder (HCC)    59 yo M with a cc of URI-like symptoms and a feeling like he could have a seizure. Patient is been off his seizure medicines for the  last 3 months. We would him with 1500 mg of Keppra orally. Her prescription for his normal dose to follow-up with his family doctor. Lab results otherwise unremarkable chest x-ray negative for infection. Patient states that he needs narcotics for his myalgias discussed utility of treating a viral illness with narcotic pain medicine and suggested that he follow with his family doctor. Patient also stated that he had an allergy to Tylenol however take it once he was not offered narcotics. He had no adverse reaction from such.   I have discussed the diagnosis/risks/treatment options with the patient and believe the pt to be eligible for discharge home to follow-up with PCP. We also discussed returning to the ED immediately if new or worsening sx occur. We discussed the sx which are most concerning (e.g., sudden worsening pain, fever, inability to tolerate by mouth) that necessitate immediate return. Medications administered to the patient during their visit and any new prescriptions provided to the patient are listed below.  Medications given during this visit Medications  ketorolac (TORADOL) 30 MG/ML injection 30 mg (30 mg Intramuscular Given 04/30/15 1706)   levETIRAcetam (KEPPRA) tablet 1,500 mg (1,500 mg Oral Given 04/30/15 1736)  potassium chloride SA (K-DUR,KLOR-CON) CR tablet 80 mEq (80 mEq Oral Given 04/30/15 1736)  magnesium oxide (MAG-OX) tablet 800 mg (800 mg Oral Given 04/30/15 1736)    Discharge Medication List as of 04/30/2015  5:32 PM      The patient appears reasonably screen and/or stabilized for discharge and I doubt any other medical condition or other [facility] requiring further screening, evaluation, or treatment in the ED at this time prior to discharge.    Melene Planan Charlotte Brafford, DO 04/30/15 2311

## 2015-10-29 ENCOUNTER — Encounter (HOSPITAL_COMMUNITY): Payer: Self-pay | Admitting: Emergency Medicine

## 2015-10-29 ENCOUNTER — Emergency Department (HOSPITAL_COMMUNITY)
Admission: EM | Admit: 2015-10-29 | Discharge: 2015-10-30 | Disposition: A | Discharge status: Home / Self Care | Payer: Medicaid Other | Attending: Emergency Medicine | Admitting: Emergency Medicine

## 2015-10-29 DIAGNOSIS — E785 Hyperlipidemia, unspecified: Secondary | ICD-10-CM | POA: Diagnosis not present

## 2015-10-29 DIAGNOSIS — F1010 Alcohol abuse, uncomplicated: Secondary | ICD-10-CM | POA: Insufficient documentation

## 2015-10-29 DIAGNOSIS — Z79899 Other long term (current) drug therapy: Secondary | ICD-10-CM | POA: Diagnosis not present

## 2015-10-29 DIAGNOSIS — R531 Weakness: Secondary | ICD-10-CM

## 2015-10-29 DIAGNOSIS — F1110 Opioid abuse, uncomplicated: Secondary | ICD-10-CM | POA: Diagnosis not present

## 2015-10-29 DIAGNOSIS — F17210 Nicotine dependence, cigarettes, uncomplicated: Secondary | ICD-10-CM | POA: Insufficient documentation

## 2015-10-29 DIAGNOSIS — I10 Essential (primary) hypertension: Secondary | ICD-10-CM | POA: Insufficient documentation

## 2015-10-29 DIAGNOSIS — F10129 Alcohol abuse with intoxication, unspecified: Secondary | ICD-10-CM | POA: Diagnosis present

## 2015-10-29 DIAGNOSIS — F1910 Other psychoactive substance abuse, uncomplicated: Secondary | ICD-10-CM

## 2015-10-29 LAB — COMPREHENSIVE METABOLIC PANEL
ALT: 22 U/L (ref 17–63)
ANION GAP: 9 (ref 5–15)
AST: 86 U/L — ABNORMAL HIGH (ref 15–41)
Albumin: 3.4 g/dL — ABNORMAL LOW (ref 3.5–5.0)
Alkaline Phosphatase: 127 U/L — ABNORMAL HIGH (ref 38–126)
BUN: 11 mg/dL (ref 6–20)
CALCIUM: 8.2 mg/dL — AB (ref 8.9–10.3)
CHLORIDE: 99 mmol/L — AB (ref 101–111)
CO2: 30 mmol/L (ref 22–32)
CREATININE: 0.96 mg/dL (ref 0.61–1.24)
Glucose, Bld: 94 mg/dL (ref 65–99)
Potassium: 2.9 mmol/L — ABNORMAL LOW (ref 3.5–5.1)
SODIUM: 138 mmol/L (ref 135–145)
Total Bilirubin: 0.9 mg/dL (ref 0.3–1.2)
Total Protein: 8.3 g/dL — ABNORMAL HIGH (ref 6.5–8.1)

## 2015-10-29 LAB — CBC WITH DIFFERENTIAL/PLATELET
Basophils Absolute: 0 10*3/uL (ref 0.0–0.1)
Basophils Relative: 0 %
EOS ABS: 0 10*3/uL (ref 0.0–0.7)
EOS PCT: 0 %
HCT: 36.5 % — ABNORMAL LOW (ref 39.0–52.0)
Hemoglobin: 13 g/dL (ref 13.0–17.0)
LYMPHS ABS: 1.8 10*3/uL (ref 0.7–4.0)
LYMPHS PCT: 33 %
MCH: 36.2 pg — AB (ref 26.0–34.0)
MCHC: 35.6 g/dL (ref 30.0–36.0)
MCV: 101.7 fL — AB (ref 78.0–100.0)
MONO ABS: 0.4 10*3/uL (ref 0.1–1.0)
MONOS PCT: 8 %
Neutro Abs: 3.2 10*3/uL (ref 1.7–7.7)
Neutrophils Relative %: 59 %
PLATELETS: 99 10*3/uL — AB (ref 150–400)
RBC: 3.59 MIL/uL — AB (ref 4.22–5.81)
RDW: 13.4 % (ref 11.5–15.5)
WBC: 5.5 10*3/uL (ref 4.0–10.5)

## 2015-10-29 LAB — LIPASE, BLOOD: LIPASE: 25 U/L (ref 11–51)

## 2015-10-29 LAB — CK: CK TOTAL: 265 U/L (ref 49–397)

## 2015-10-29 MED ORDER — SODIUM CHLORIDE 0.9 % IV BOLUS (SEPSIS): 1000.0000 mL | Freq: Once | INTRAVENOUS | Status: DC

## 2015-10-29 MED ORDER — ONDANSETRON HCL 4 MG/2ML IJ SOLN
4.0000 mg | Freq: Once | INTRAMUSCULAR | Status: AC
  Administered 2015-10-29: 4 mg via INTRAVENOUS
  Filled 2015-10-29: qty 2

## 2015-10-29 MED ORDER — THIAMINE HCL 100 MG/ML IJ SOLN
Freq: Once | INTRAVENOUS | Status: AC
  Administered 2015-10-29: 22:00:00 via INTRAVENOUS
  Filled 2015-10-29: qty 1000

## 2015-10-29 MED ORDER — POTASSIUM CHLORIDE CRYS ER 20 MEQ PO TBCR
40.0000 meq | EXTENDED_RELEASE_TABLET | Freq: Once | ORAL | Status: AC
  Administered 2015-10-30: 40 meq via ORAL
  Filled 2015-10-29: qty 2

## 2015-10-29 NOTE — ED Provider Notes (Signed)
CSN: [id number]     Arrival date & time 10/29/15  2053 History   First MD Initiated Contact with Patient 10/29/15 2115     Chief Complaint  Patient presents with  . Alcohol Intoxication     (Consider location/radiation/quality/duration/timing/severity/associated sxs/prior Treatment) HPI Comments: Patient with a history of seizure disorder, polysubstance abuse presents via EMS after call for "unresponsive". Per EMS report, patient awake, alert and oriented on their arrival. Patient admitted to a beer and a vicodin today, however, reports 6 week history of what feels like to him that his blood sugar rises and falls. He reports intermittent weakness, lightheadedness and falls. Cannot confirm syncope. No known seizure activity - no tongue biting. He reports compliance with medications. He admits to alcohol and drug use but feels this is not the cause of his symptoms. He reports anorexia with an 8-9 pound weight loss. No vomiting but reports x3 daily bowel movements that are non-bloody. He has chronic pain in his back and side from a previous injury but also complains of additional generalized pain. No cough, SOB, specific chest or abdominal pain. He has not seen his doctor in the last 6 weeks (Dr. Lerry Liner).   Patient is a 60 y.o. male presenting with intoxication. The history is provided by the patient and the EMS personnel. No language interpreter was used.  Alcohol Intoxication Associated symptoms include myalgias, nausea and weakness. Pertinent negatives include no abdominal pain, chest pain, chills, fever, rash or vomiting.    Past Medical History  Diagnosis Date  . Chronic pain   . Arthritis   . Hypertension   . Chronic pain   . Diabetes mellitus without complication (HCC)     borderline  . Hyperlipidemia   . Seizures [facility])    Past Surgical History  Procedure Laterality Date  . Abdominal surgery    . Hemorroidectomy    . Appendectomy     Family History  Problem Relation Age  of Onset  . Hypertension Mother   . Migraines Sister   . Heart failure Brother   . Migraines Brother    Social History  Substance Use Topics  . Smoking status: Current Every Day Smoker -- 0.50 packs/day for 30 years    Types: Cigarettes  . Smokeless tobacco: Never Used  . Alcohol Use: Yes     Comment: occ    Review of Systems  Constitutional: Positive for appetite change and unexpected weight change. Negative for fever and chills.  HENT: Negative.   Respiratory: Negative.  Negative for shortness of breath.   Cardiovascular: Negative.  Negative for chest pain.  Gastrointestinal: Positive for nausea and diarrhea. Negative for vomiting, abdominal pain and blood in stool.  Genitourinary: Negative.   Musculoskeletal: Positive for myalgias and back pain. Negative for neck stiffness.  Skin: Negative.  Negative for rash and wound.  Neurological: Positive for weakness and light-headedness. Negative for seizures.      Allergies  Ace inhibitors and Tylenol  Home Medications   Prior to Admission medications   Medication Sig Start Date End Date Taking? Authorizing Provider  acetaminophen (TYLENOL) 500 MG tablet Take 1,000 mg by mouth every 6 (six) hours as needed (pain).     Historical Provider, MD  cholecalciferol (VITAMIN D) 1000 UNITS tablet Take 1,000 Units by mouth daily.    Historical Provider, MD  feeding supplement, ENSURE COMPLETE, (ENSURE COMPLETE) LIQD Take 237 mLs by mouth 3 (three) times daily between meals. 06/16/14   Rhetta Mura, MD  folic acid (  FOLVITE) 1 MG tablet Take 1 tablet (1 mg total) by mouth daily. 10/08/12   Penny Pia, MD  gabapentin (NEURONTIN) 300 MG capsule Take 1 capsule (300 mg total) by mouth 3 (three) times daily. Patient taking differently: Take 300 mg by mouth 2 (two) times daily.  07/31/12   Ruby Cola, PA-C  HYDROcodone-acetaminophen (NORCO/VICODIN) 5-325 MG per tablet Take 1-2 tablets by mouth every 6 (six) hours as needed for  severe pain. 06/16/14   Rhetta Mura, MD  levETIRAcetam (KEPPRA) 500 MG tablet Take 1 tablet (500 mg total) by mouth 2 (two) times daily. 04/30/15   Melene Plan, DO  metoprolol tartrate (LOPRESSOR) 25 MG tablet Take 12.5 mg by mouth 2 (two) times daily.    Historical Provider, MD  thiamine 100 MG tablet Take 1 tablet (100 mg total) by mouth daily. 03/04/14   Leana Roe Elgergawy, MD   BP 155/97 mmHg  Pulse 78  Temp(Src) 98.5 F (36.9 C) (Oral)  Resp 14  SpO2 100% Physical Exam  Constitutional: He is oriented to person, place, and time. He appears well-developed and well-nourished. No distress.  Does not appear acutely intoxicated.  HENT:  Head: Normocephalic and atraumatic.  Mouth/Throat: Mucous membranes are dry.  Eyes: Conjunctivae are normal.  Neck: Normal range of motion. Neck supple.  Cardiovascular: Normal rate and regular rhythm.   No murmur heard. Pulmonary/Chest: Effort normal and breath sounds normal. He has no wheezes. He has no rales.  Abdominal: Soft. Bowel sounds are normal. There is tenderness (Diffuse abdominal tenderness. ). There is no rebound and no guarding.  Musculoskeletal: Normal range of motion.  Neurological: He is alert and oriented to person, place, and time.  CN's 3-12 grossly intact. No deficits of coordination. No facial asymmetry. Speech clear and focused.   Skin: Skin is warm and dry. No rash noted.  Psychiatric: He has a normal mood and affect.    ED Course  Procedures (including critical care time) Labs Review Labs Reviewed  CBC WITH DIFFERENTIAL/PLATELET  COMPREHENSIVE METABOLIC PANEL  LIPASE, BLOOD   Results for orders placed or performed during the hospital encounter of 10/29/15  CBC with Differential  Result Value Ref Range   WBC 5.5 4.0 - 10.5 K/uL   RBC 3.59 (L) 4.22 - 5.81 MIL/uL   Hemoglobin 13.0 13.0 - 17.0 g/dL   HCT 16.1 (L) 09.6 - 04.5 %   MCV 101.7 (H) 78.0 - 100.0 fL   MCH 36.2 (H) 26.0 - 34.0 pg   MCHC 35.6 30.0 - 36.0  g/dL   RDW 40.9 81.1 - 91.4 %   Platelets 99 (L) 150 - 400 K/uL   Neutrophils Relative % 59 %   Neutro Abs 3.2 1.7 - 7.7 K/uL   Lymphocytes Relative 33 %   Lymphs Abs 1.8 0.7 - 4.0 K/uL   Monocytes Relative 8 %   Monocytes Absolute 0.4 0.1 - 1.0 K/uL   Eosinophils Relative 0 %   Eosinophils Absolute 0.0 0.0 - 0.7 K/uL   Basophils Relative 0 %   Basophils Absolute 0.0 0.0 - 0.1 K/uL  Comprehensive metabolic panel  Result Value Ref Range   Sodium 138 135 - 145 mmol/L   Potassium 2.9 (L) 3.5 - 5.1 mmol/L   Chloride 99 (L) 101 - 111 mmol/L   CO2 30 22 - 32 mmol/L   Glucose, Bld 94 65 - 99 mg/dL   BUN 11 6 - 20 mg/dL   Creatinine, Ser 7.82 0.61 - 1.24 mg/dL   Calcium  8.2 (L) 8.9 - 10.3 mg/dL   Total Protein 8.3 (H) 6.5 - 8.1 g/dL   Albumin 3.4 (L) 3.5 - 5.0 g/dL   AST 86 (H) 15 - 41 U/L   ALT 22 17 - 63 U/L   Alkaline Phosphatase 127 (H) 38 - 126 U/L   Total Bilirubin 0.9 0.3 - 1.2 mg/dL   GFR calc non Af Amer >60 >60 mL/min   GFR calc Af Amer >60 >60 mL/min   Anion gap 9 5 - 15  Lipase, blood  Result Value Ref Range   Lipase 25 11 - 51 U/L  CK  Result Value Ref Range   Total CK 265 49 - 397 U/L     Imaging Review No results found. I have personally reviewed and evaluated these images and lab results as part of my medical decision-making.   EKG Interpretation None      MDM   Final diagnoses:  None    1. Weakness 2. Polysubstance abuse  Patient presents with concerns for intermittent episodes of weakness and lightheadedness causing falls. Symptoms x 6 weeks. EMS called tonight due to "unresponsive' but awake and alert when EMS arrived. No seizure activity. He is eating and drinking in the ED, rehydrated including banana bag. On re-evaluation the patient remains awake, alert, focused. Labs reassuring. He can be discharged home with PCP follow up for any persistent symptoms.     Elpidio AnisShari Rhandi Despain, PA-C 10/30/15 0000  Jacalyn LefevreJulie Haviland, MD 10/30/15 [phone]

## 2015-10-29 NOTE — ED Notes (Signed)
Pt BIB EMS. Pt is from a ETOH/Substance abuse center. EMS was initially called for unresponsive ETOH and drug overdose. EMS states pt was talking and alert upon their arrival. Pt admitted to drinking a 40 oz. Colt 45 and snorting some Vicodin. He was ambulatory to gurney. EMS states he is alert to person and place. He has no acute distress. Skin warm, dry.

## 2015-10-29 NOTE — ED Notes (Signed)
Bed: WA21 Expected date:  Expected time:  Means of arrival:  Comments: EMS 60 yo M gen weakness

## 2015-10-30 NOTE — ED Notes (Signed)
Pt is angry and verbally abusive to this writer d/t being d/c'd.  Informed pt that this writer has no control over when d/c occurs.

## 2015-10-30 NOTE — Discharge Instructions (Signed)

## 2015-11-22 ENCOUNTER — Emergency Department (HOSPITAL_COMMUNITY)
Admission: EM | Admit: 2015-11-22 | Discharge: 2015-11-23 | Disposition: A | Discharge status: Home / Self Care | Payer: Medicaid Other | Attending: Emergency Medicine | Admitting: Emergency Medicine

## 2015-11-22 ENCOUNTER — Encounter (HOSPITAL_COMMUNITY): Payer: Self-pay | Admitting: *Deleted

## 2015-11-22 DIAGNOSIS — Z79899 Other long term (current) drug therapy: Secondary | ICD-10-CM | POA: Insufficient documentation

## 2015-11-22 DIAGNOSIS — I10 Essential (primary) hypertension: Secondary | ICD-10-CM | POA: Diagnosis not present

## 2015-11-22 DIAGNOSIS — F1010 Alcohol abuse, uncomplicated: Secondary | ICD-10-CM | POA: Insufficient documentation

## 2015-11-22 DIAGNOSIS — M1990 Unspecified osteoarthritis, unspecified site: Secondary | ICD-10-CM | POA: Insufficient documentation

## 2015-11-22 DIAGNOSIS — M5442 Lumbago with sciatica, left side: Secondary | ICD-10-CM | POA: Insufficient documentation

## 2015-11-22 DIAGNOSIS — E119 Type 2 diabetes mellitus without complications: Secondary | ICD-10-CM | POA: Diagnosis not present

## 2015-11-22 DIAGNOSIS — E785 Hyperlipidemia, unspecified: Secondary | ICD-10-CM | POA: Diagnosis not present

## 2015-11-22 DIAGNOSIS — M545 Low back pain: Secondary | ICD-10-CM | POA: Diagnosis present

## 2015-11-22 DIAGNOSIS — F17210 Nicotine dependence, cigarettes, uncomplicated: Secondary | ICD-10-CM | POA: Diagnosis not present

## 2015-11-22 DIAGNOSIS — M5432 Sciatica, left side: Secondary | ICD-10-CM

## 2015-11-22 LAB — CBG MONITORING, ED: GLUCOSE-CAPILLARY: 124 mg/dL — AB (ref 65–99)

## 2015-11-22 NOTE — ED Notes (Addendum)
Per EMS, pt here for detox. Pt denies wanting detox, states he is here for pain in his left hip and lower. Pt states he last had 2 beers today at [facility]PM.

## 2015-11-22 NOTE — ED Notes (Signed)
Pt ambulated to restroom and back to stretcher with his cane. Pt tolerated it well and had a steady gait with cane use.

## 2015-11-22 NOTE — ED Provider Notes (Signed)
CSN: [id number]     Arrival date & time 11/22/15  2117 History  By signing my name below, I, Tanda RockersMargaux Venter, attest that this documentation has been prepared under the direction and in the presence of Gilda Creasehristopher J Angle Dirusso, MD. Electronically Signed: Tanda RockersMargaux Venter, ED Scribe. 11/22/2015. 11:19 PM.   Chief Complaint  Patient presents with  . Alcohol Problem  . Back Pain  . Hip Pain   The history is provided by the patient. No language interpreter was used.     HPI Comments: Joelene MillinLevonda Poncedeleon is a 60 y.o. male brought in by ambulance, who presents to the Emergency Department complaining of gradual onset, constant, left lower back pain radiating down to left knee x "awhile." Pt mentions previously fracturing his left hip and has been having issues ever since. Per triage report, pt is here for detox from [facility]. He last had 2 beers earlier today at 2 PM. Denies any other associated symptoms.   Past Medical History  Diagnosis Date  . Chronic pain   . Arthritis   . Hypertension   . Chronic pain   . Diabetes mellitus without complication (HCC)     borderline  . Hyperlipidemia   . Seizures [facility](HCC)    Past Surgical History  Procedure Laterality Date  . Abdominal surgery    . Hemorroidectomy    . Appendectomy     Family History  Problem Relation Age of Onset  . Hypertension Mother   . Migraines Sister   . Heart failure Brother   . Migraines Brother    Social History  Substance Use Topics  . Smoking status: Current Every Day Smoker -- 0.50 packs/day for 30 years    Types: Cigarettes  . Smokeless tobacco: Never Used  . Alcohol Use: Yes     Comment: occ    Review of Systems  Constitutional: Negative for fever.  Musculoskeletal: Positive for back pain and arthralgias.  All other systems reviewed and are negative.  Allergies  Ace inhibitors and Tylenol  Home Medications   Prior to Admission medications   Medication Sig Start Date End Date Taking? Authorizing Provider   acetaminophen (TYLENOL) 500 MG tablet Take 1,000 mg by mouth every 6 (six) hours as needed (for pain.).   Yes Historical Provider, MD  feeding supplement, ENSURE COMPLETE, (ENSURE COMPLETE) LIQD Take 237 mLs by mouth 3 (three) times daily between meals. 06/16/14  Yes Rhetta MuraJai-Gurmukh Samtani, MD  folic acid (FOLVITE) 1 MG tablet Take 1 mg by mouth daily.   Yes Historical Provider, MD  gabapentin (NEURONTIN) 300 MG capsule Take 1 capsule (300 mg total) by mouth 3 (three) times daily. Patient taking differently: Take 300 mg by mouth 3 (three) times daily as needed (for nerve pain.).  07/31/12  Yes Ruby Colaatherine Schinlever, PA-C  HYDROcodone-acetaminophen (NORCO) 10-325 MG tablet Take 1 tablet by mouth every 6 (six) hours as needed for moderate pain.   Yes Historical Provider, MD  levETIRAcetam (KEPPRA) 500 MG tablet Take 1 tablet (500 mg total) by mouth 2 (two) times daily. 04/30/15  Yes Melene Planan Floyd, DO  lisinopril (PRINIVIL,ZESTRIL) 5 MG tablet Take 2.5 mg by mouth daily.    Yes Historical Provider, MD  Magnesium Oxide 420 MG TABS Take 840 mg by mouth daily.   Yes Historical Provider, MD  Multiple Vitamin (MULTIVITAMIN WITH MINERALS) TABS tablet Take 1 tablet by mouth daily.   Yes Historical Provider, MD  sildenafil (VIAGRA) 100 MG tablet Take 100 mg by mouth daily as needed for erectile dysfunction.  Yes Historical Provider, MD   BP 112/76 mmHg  Pulse 99  Temp(Src) 98.4 F (36.9 C) (Oral)  Resp 18  SpO2 98%   Physical Exam  Constitutional: He is oriented to person, place, and time. He appears well-developed and well-nourished. No distress.  HENT:  Head: Normocephalic and atraumatic.  Right Ear: Hearing normal.  Left Ear: Hearing normal.  Nose: Nose normal.  Mouth/Throat: Oropharynx is clear and moist and mucous membranes are normal.  Eyes: Conjunctivae and EOM are normal. Pupils are equal, round, and reactive to light.  Neck: Normal range of motion. Neck supple.  Cardiovascular: Regular rhythm, S1  normal and S2 normal.  Exam reveals no gallop and no friction rub.   No murmur heard. Pulmonary/Chest: Effort normal and breath sounds normal. No respiratory distress. He exhibits no tenderness.  Abdominal: Soft. Normal appearance and bowel sounds are normal. There is no hepatosplenomegaly. There is no tenderness. There is no rebound, no guarding, no tenderness at McBurney's point and negative Murphy's sign. No hernia.  Musculoskeletal: Normal range of motion. He exhibits tenderness.  Tenderness to left lower back  Neurological: He is alert and oriented to person, place, and time. He has normal strength. No cranial nerve deficit or sensory deficit. Coordination normal. GCS eye subscore is 4. GCS verbal subscore is 5. GCS motor subscore is 6.  Skin: Skin is warm, dry and intact. No rash noted. No cyanosis.  Psychiatric: He has a normal mood and affect. His speech is normal and behavior is normal. Thought content normal.  Nursing note and vitals reviewed.   ED Course  Procedures (including critical care time)  DIAGNOSTIC STUDIES: Oxygen Saturation is 98% on RA, normal by my interpretation.    COORDINATION OF CARE: 11:19 PM-Discussed treatment plan with pt at bedside and pt agreed to plan.   Labs Review Labs Reviewed - No data to display  Imaging Review No results found. I have personally reviewed and evaluated these images and lab results as part of my medical decision-making.   EKG Interpretation None      MDM   Final diagnoses:  None  Sciatica  Patient presents to the emergency department with complaints of back pain. Patient reports previous history of sciatica. Patient experiencing pain in the left side of his back radiating down his leg. He has normal strength and sensation, however. No saddle anesthesia. No foot drop. Patient does not have any signs of acute neurosurgical emergency. He reports a history of hypertension. Blood pressure is reasonably well-controlled today. He  also reports a history of diabetes, blood sugar was 124. Patient was intoxicated at arrival. He was allowed to sleep here in the ER and upon wakening was feeling improved. He reports that he is going to detox and [place] in 8 days.   I personally performed the services described in this documentation, which was scribed in my presence. The recorded information has been reviewed and is accurate.      Gilda Creasehristopher J Keary Hanak, MD 11/23/15 [phone]

## 2015-11-23 MED ORDER — NAPROXEN 375 MG PO TABS: 375.0000 mg | ORAL_TABLET | Freq: Two times a day (BID) | ORAL | Status: DC

## 2015-11-23 NOTE — Discharge Instructions (Signed)
Alcohol Use Disorder °Alcohol use disorder is a mental disorder. It is not a one-time incident of heavy drinking. Alcohol use disorder is the excessive and uncontrollable use of alcohol over time that leads to problems with functioning in one or more areas of daily living. People with this disorder risk harming themselves and others when they drink to excess. Alcohol use disorder also can cause other mental disorders, such as mood and anxiety disorders, and serious physical problems. People with alcohol use disorder often misuse other drugs.  °Alcohol use disorder is common and widespread. Some people with this disorder drink alcohol to cope with or escape from negative life events. Others drink to relieve chronic pain or symptoms of mental illness. People with a family history of alcohol use disorder are at higher risk of losing control and using alcohol to excess.  °Drinking too much alcohol can cause injury, accidents, and health problems. One drink can be too much when you are: °· Working. °· Pregnant or breastfeeding. °· Taking medicines. Ask your doctor. °· Driving or planning to drive. °SYMPTOMS  °Signs and symptoms of alcohol use disorder may include the following:  °· Consumption of alcohol in larger amounts or over a longer period of time than intended. °· Multiple unsuccessful attempts to cut down or control alcohol use.   °· A great deal of time spent obtaining alcohol, using alcohol, or recovering from the effects of alcohol (hangover). °· A strong desire or urge to use alcohol (cravings).   °· Continued use of alcohol despite problems at work, school, or home because of alcohol use.   °· Continued use of alcohol despite problems in relationships because of alcohol use. °· Continued use of alcohol in situations when it is physically hazardous, such as driving a car. °· Continued use of alcohol despite awareness of a physical or psychological problem that is likely related to alcohol use. Physical  problems related to alcohol use can involve the brain, heart, liver, stomach, and intestines. Psychological problems related to alcohol use include intoxication, depression, anxiety, psychosis, delirium, and dementia.   °· The need for increased amounts of alcohol to achieve the same desired effect, or a decreased effect from the consumption of the same amount of alcohol (tolerance). °· Withdrawal symptoms upon reducing or stopping alcohol use, or alcohol use to reduce or avoid withdrawal symptoms. Withdrawal symptoms include: °· Racing heart. °· Hand tremor. °· Difficulty sleeping. °· Nausea. °· Vomiting. °· Hallucinations. °· Restlessness. °· Seizures. °DIAGNOSIS °Alcohol use disorder is diagnosed through an assessment by your health care provider. Your health care provider may start by asking three or four questions to screen for excessive or problematic alcohol use. To confirm a diagnosis of alcohol use disorder, at least two symptoms must be present within a 12-month period. The severity of alcohol use disorder depends on the number of symptoms: °· Mild--two or three. °· Moderate--four or five. °· Severe--six or more. °Your health care provider may perform a physical exam or use results from lab tests to see if you have physical problems resulting from alcohol use. Your health care provider may refer you to a mental health professional for evaluation. °TREATMENT  °Some people with alcohol use disorder are able to reduce their alcohol use to low-risk levels. Some people with alcohol use disorder need to quit drinking alcohol. When necessary, mental health professionals with specialized training in substance use treatment can help. Your health care provider can help you decide how severe your alcohol use disorder is and what type of treatment you need.   The following forms of treatment are available:   Detoxification. Detoxification involves the use of prescription medicines to prevent alcohol withdrawal  symptoms in the first week after quitting. This is important for people with a history of symptoms of withdrawal and for heavy drinkers who are likely to have withdrawal symptoms. Alcohol withdrawal can be dangerous and, in severe cases, cause death. Detoxification is usually provided in a hospital or in-patient substance use treatment facility.  Counseling or talk therapy. Talk therapy is provided by substance use treatment counselors. It addresses the reasons people use alcohol and ways to keep them from drinking again. The goals of talk therapy are to help people with alcohol use disorder find healthy activities and ways to cope with life stress, to identify and avoid triggers for alcohol use, and to handle cravings, which can cause relapse.  Medicines.Different medicines can help treat alcohol use disorder through the following actions:  Decrease alcohol cravings.  Decrease the positive reward response felt from alcohol use.  Produce an uncomfortable physical reaction when alcohol is used (aversion therapy).  Support groups. Support groups are run by people who have quit drinking. They provide emotional support, advice, and guidance. These forms of treatment are often combined. Some people with alcohol use disorder benefit from intensive combination treatment provided by specialized substance use treatment centers. Both inpatient and outpatient treatment programs are available.   This information is not intended to replace advice given to you by your health care provider. Make sure you discuss any questions you have with your health care provider.   Document Released: 06/15/2004 Document Revised: 05/29/2014 Document Reviewed: 08/15/2012 Elsevier Interactive Patient Education 2016 Elsevier Inc.  Sciatica Sciatica is pain, weakness, numbness, or tingling along your sciatic nerve. The nerve starts in the lower back and runs down the back of each leg. Nerve damage or certain conditions pinch  or put pressure on the sciatic nerve. This causes the pain, weakness, and other discomforts of sciatica. HOME CARE   Only take medicine as told by your doctor.  Apply ice to the affected area for 20 minutes. Do this 3-4 times a day for the first 48-72 hours. Then try heat in the same way.  Exercise, stretch, or do your usual activities if these do not make your pain worse.  Go to physical therapy as told by your doctor.  Keep all doctor visits as told.  Do not wear high heels or shoes that are not supportive.  Get a firm mattress if your mattress is too soft to lessen pain and discomfort. GET HELP RIGHT AWAY IF:   You cannot control when you poop (bowel movement) or pee (urinate).  You have more weakness in your lower back, lower belly (pelvis), butt (buttocks), or legs.  You have redness or puffiness (swelling) of your back.  You have a burning feeling when you pee.  You have pain that gets worse when you lie down.  You have pain that wakes you from your sleep.  Your pain is worse than past pain.  Your pain lasts longer than 4 weeks.  You are suddenly losing weight without reason. MAKE SURE YOU:   Understand these instructions.  Will watch this condition.  Will get help right away if you are not doing well or get worse.   This information is not intended to replace advice given to you by your health care provider. Make sure you discuss any questions you have with your health care provider.   Document Released: 02/15/2008  Document Revised: 01/27/2015 Document Reviewed: 09/17/2011 Elsevier Interactive Patient Education 2016 [employer] Substance Abuse Treatment Programs  Intensive Outpatient Programs [facility]     [address]      [place], [place]                   [phone]       [facility] [address] [place], [place] [phone]  [facility]     (Inpatient and  outpatient)     [address]           [phone]    [facility] [phone] (Suboxone and Methadone)  [address]      [place], [place] [address]      [phone]       [address] [place], [place] [phone]  [facility] (Outpatient/Inpatient, Chemical)    (insurance only) [phone]             [facility] (Groups & Residential) [place], [place] [phone]     [facility]     [address]     [place], [place]      [phone]       [facility] (for caregivers and family) [phone] [address] [place], [place] [phone]      Residential Treatment Programs [facility]      [address], [place], [place] [address]  [phone]       [facility] [address] [place], [place] [address] [phone]  [facility]        [phone]       [facility] [phone]  [facility] ([facility]             [address]                                         [place], [place]                                                [phone] or [phone]                               [facility] [place] [address] [place], [id number] [phone]  [facility]    [address]      [place], [place]     [phone]       [facility] [address] [place], [place] [phone]  [facility]   [address]     [place], [place] [address]     [phone]      Admissions: 8am-3pm M-F  [facility] (RTS) [address] [place], [place] [phone]  [facility] Program: Residential Program [phone](90 Days)   [place], [place]      [phone] or [phone]     [facility]ATC: [facility] [place], [place] (Walk in Hours over the weekend or by referral)  [facility] [address] [place] [place], [place], [place] [address] [phone]  Crisis Mobile:  [facility]:  [phone] (for crisis response 24 hours a day) [facility] Hotline:  [phone] Outpatient Psychiatry and Counseling  [facility]: Mobile Crisis Management 24 hours:  [phone]  [facility] sliding scale fee and walk in schedule: M-F 8am-12pm/1pm-3pm [address]  [place], [place] [address] [phone]  [facility] [address] [place], [place] [address] [phone]  [facility] ([facility])- new patient walk-in appointments available Monday - Friday 8am -3pm.          [address] [place], [place] [address] [phone] or crisis line- [phone]  [facility] Services/ Intensive Outpatient Therapy Program [address] [place], [place] [phone]  [facility]                  Crisis Services      [phone]      [address]     [place], [place] [address]                 [facility]   [facility] [phone] [address] [place], [place] [address]   [facility]          [address] [place],  [place], [place] [address]       [phone]  [facility] [address] [place], [place] [address] [phone]  [facility]    [address]    [place], [place] [address]     [phone]       Andee PolesParish McKinney, MD     [address]     [place] [address]     [phone]       [facility] [address] [place] [address]  [facility]     [address]     [place], [place]      [phone]       [facility] Eulogio DitchShamsher Ahluwalia, MD [address] [place], [place] [address] [phone]  [facility]     [address]     [place], [place]  [address]     [phone]       [facility] [address] [place], [place] [address] [phone] Resources for Temporary Residential Assistance/Crisis Centers  [facility](IRC) M-F 8am-3pm   [address] [place], [place] [address]   [phone] Services include: laundry, barbering, support groups, case management, phone  & computer access, showers, AA/NA mtgs, mental health/substance abuse nurse, job skills class, disability information, VA assistance, spiritual classes, etc.   HOMELESS SHELTERS  [facility]     [facility]   [address], [place]     [phone]              [facility] (women and children)       [address] [place], [place] [address] [phone] [email or web address] for application and process Application Required  [facility]   [address]    [place] [address]     [phone]                    [facility] [address] [place], [place] [address] [phone](schedule application appt.) Application Required  [facility] (women only)    [address]     [place], [place]  [address]     [phone]      Intake starts 6pm daily Need valid ID, SSC, & Police report [employer] [address] [place], [place] [phone] Application Required  [facility] (men only)     [address]      [place], [place]     [phone]       [facility] (Pregnant women only) [address] [place], [place] [phone]  [facility]      [address]      [place], [place] [address]     [phone]             [facility] [address] [place], [place] [phone] 90 day commitment/SA/Application process  [facility](men only)     [address]     [place],  [place]     [phone]       Check-in at [facility] [address] [place], [place] [phone] Men/Women/Women and Children must be there by 7 pm  [facility] [place], [place] [phone]

## 2015-11-23 NOTE — ED Notes (Signed)
Pt is awake and has ambulated to the br with no problems

## 2016-03-20 ENCOUNTER — Emergency Department (HOSPITAL_COMMUNITY)
Admission: EM | Admit: 2016-03-20 | Discharge: 2016-03-20 | Disposition: A | Discharge status: Home / Self Care | Payer: Medicaid Other | Attending: Emergency Medicine | Admitting: Emergency Medicine

## 2016-03-20 ENCOUNTER — Encounter (HOSPITAL_COMMUNITY): Payer: Self-pay | Admitting: Emergency Medicine

## 2016-03-20 DIAGNOSIS — Z79899 Other long term (current) drug therapy: Secondary | ICD-10-CM | POA: Insufficient documentation

## 2016-03-20 DIAGNOSIS — F10239 Alcohol dependence with withdrawal, unspecified: Secondary | ICD-10-CM | POA: Insufficient documentation

## 2016-03-20 DIAGNOSIS — F1020 Alcohol dependence, uncomplicated: Secondary | ICD-10-CM

## 2016-03-20 DIAGNOSIS — I10 Essential (primary) hypertension: Secondary | ICD-10-CM | POA: Insufficient documentation

## 2016-03-20 DIAGNOSIS — E119 Type 2 diabetes mellitus without complications: Secondary | ICD-10-CM | POA: Insufficient documentation

## 2016-03-20 DIAGNOSIS — F17210 Nicotine dependence, cigarettes, uncomplicated: Secondary | ICD-10-CM | POA: Insufficient documentation

## 2016-03-20 LAB — CBG MONITORING, ED: Glucose-Capillary: 93 mg/dL (ref 65–99)

## 2016-03-20 MED ORDER — HYDROCODONE-ACETAMINOPHEN 5-325 MG PO TABS
1.0000 | ORAL_TABLET | Freq: Once | ORAL | Status: AC
  Administered 2016-03-20: 1 via ORAL
  Filled 2016-03-20: qty 1

## 2016-03-20 NOTE — ED Triage Notes (Signed)
Pt requesting alcohol detox, began drinking again yesterday morning after 2 months of sobriety because his pain clinic closed down and he could not get pain medicine. Drank six 24-ounce beers, last drink today at 0100.

## 2016-03-20 NOTE — ED Notes (Addendum)
This Consulting civil engineerCharge RN was asked to speak to EastviewDavid from [employer].  Onalee Huaavid asked to speak to someone "above" the Primary RN.  He was demanding that we place him in a detox facility.  I reiterated what the Primary RN had already explained, which was that we only admit if the Pt is showing signs of DTs, we do not place Pt's in detox facilities, and we provided resources for Outpatient Therapy and Residential Treatment Programs.  Onalee HuaDavid reported that he felt the Pt was in a medical crisis and that we could not safely discharge him.  It was explained that the Pt had been seen and medically cleared by our ED physician.  Onalee HuaDavid continued to escalate and belittle this Clinical research associatewriter regarding our care and management of the Pt.  He continued to demand that we place him in another emergency room that would provide detox.  This Clinical research associatewriter informed him that [facility] may provide that service, but it was not guaranteed.  Onalee HuaDavid continued escalate and belittle this Clinical research associatewriter, so I decided to end the phone call.       Pt reported "I'm really sorry about him.  You just can't talk to him."

## 2016-03-20 NOTE — Discharge Instructions (Signed)
Patient does not need medical detoxification at this time and is medically cleared and is appropriate [facility].

## 2016-03-20 NOTE — ED Notes (Signed)
Bed: [facility] Expected date:  Expected time:  Means of arrival:  Comments: 

## 2016-03-20 NOTE — ED Provider Notes (Addendum)
[facility]-EMERGENCY DEPT Provider Note   CSN: [id number] Arrival date & time: 03/20/16  1218     History   Chief Complaint Chief Complaint  Patient presents with  . Alcohol Detox    HPI Dennis Zhang is a 60 y.o. male.  Patient confused and not sure why he is here. He thinks [facility] wants him to get detox. He last drank last night. He is not having anxiety, nervousness, tremors, hallucinations or seizures. No h/o DT's that he knows of. No recent fevers, nausea, vomiting or other symptoms.     Illness  Pertinent negatives include no chest pain, no abdominal pain and no shortness of breath.    Past Medical History:  Diagnosis Date  . Arthritis   . Chronic pain   . Chronic pain   . 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 Hyperlipidemia   . Hypertension   . Seizures [facility](HCC)     Patient Active Problem List   Diagnosis Date Noted  . Syncope 06/15/2014  . Hypotension 06/15/2014  . Fall   . Arterial hypotension   . Weakness   . Dehydration 06/14/2014  . Protein-calorie malnutrition, severe (HCC) 05/21/2014  . Acute kidney injury (HCC) 05/20/2014  . Orthostatic hypotension 05/20/2014  . Alcohol abuse 05/20/2014  . Transaminitis 05/20/2014  . Thrombocytopenia (HCC) 05/20/2014  . Acute renal failure (HCC) 03/03/2014  . Alcohol intoxication (HCC) 03/03/2014  . Increased anion gap metabolic acidosis 03/03/2014  . Malnutrition of moderate degree (HCC) 03/03/2014  . Hypokalemia 10/06/2012  . Gram-negative bacteremia 10/06/2012  . Leukocytosis 10/03/2012  . Chronic pancreatitis (HCC) 10/03/2012  . AKI (acute kidney injury) (HCC) 10/03/2012  . Alcoholic hepatitis 10/03/2012  . Hypoglycemia secondary to sulfonylurea 10/03/2012    Past Surgical History:  Procedure Laterality Date  . ABDOMINAL SURGERY    . APPENDECTOMY    . HEMORROIDECTOMY         Home Medications    Prior to Admission medications   Medication Sig Start Date End Date Taking?  Authorizing Provider  acetaminophen (TYLENOL) 500 MG tablet Take 1,000 mg by mouth every 6 (six) hours as needed (for pain.).    Historical Provider, MD  feeding supplement, ENSURE COMPLETE, (ENSURE COMPLETE) LIQD Take 237 mLs by mouth 3 (three) times daily between meals. 06/16/14   Rhetta MuraJai-Gurmukh Samtani, MD  folic acid (FOLVITE) 1 MG tablet Take 1 mg by mouth daily.    Historical Provider, MD  gabapentin (NEURONTIN) 300 MG capsule Take 1 capsule (300 mg total) by mouth 3 (three) times daily. Patient taking differently: Take 300 mg by mouth 3 (three) times daily as needed (for nerve pain.).  07/31/12   Ruby Colaatherine Schinlever, PA-C  HYDROcodone-acetaminophen (NORCO) 10-325 MG tablet Take 1 tablet by mouth every 6 (six) hours as needed for moderate pain.    Historical Provider, MD  levETIRAcetam (KEPPRA) 500 MG tablet Take 1 tablet (500 mg total) by mouth 2 (two) times daily. 04/30/15   Melene Planan Floyd, DO  lisinopril (PRINIVIL,ZESTRIL) 5 MG tablet Take 2.5 mg by mouth daily.     Historical Provider, MD  Magnesium Oxide 420 MG TABS Take 840 mg by mouth daily.    Historical Provider, MD  Multiple Vitamin (MULTIVITAMIN WITH MINERALS) TABS tablet Take 1 tablet by mouth daily.    Historical Provider, MD  naproxen (NAPROSYN) 375 MG tablet Take 1 tablet (375 mg total) by mouth 2 (two) times daily. 11/23/15   Gilda Creasehristopher J Pollina, MD  sildenafil (VIAGRA) 100 MG tablet  Take 100 mg by mouth daily as needed for erectile dysfunction.    Historical Provider, MD    Family History Family History  Problem Relation Age of Onset  . Hypertension Mother   . Migraines Sister   . Heart failure Brother   . Migraines Brother     Social History Social History  Substance Use Topics  . Smoking status: Current Every Day Smoker    Packs/day: 0.50    Years: 30.00    Types: Cigarettes  . Smokeless tobacco: Never Used  . Alcohol use Yes     Comment: occ     Allergies   Ace inhibitors and Tylenol [acetaminophen]   Review  of Systems Review of Systems  Constitutional: Negative for chills and fever.  HENT: Negative for congestion and dental problem.   Respiratory: Negative for cough and shortness of breath.   Cardiovascular: Negative for chest pain.  Gastrointestinal: Negative for abdominal pain.  Endocrine: Negative for polyuria.  Musculoskeletal: Negative for arthralgias.  All other systems reviewed and are negative.    Physical Exam Updated Vital Signs BP 147/89 (BP Location: Right Arm)   Pulse 97   Temp 98.4 F (36.9 C) (Oral)   Resp 18   SpO2 99%   Physical Exam  Constitutional: He is oriented to person, place, and time. He appears well-developed and well-nourished.  HENT:  Head: Normocephalic and atraumatic.  Eyes: Conjunctivae and EOM are normal.  Neck: Normal range of motion.  Cardiovascular: Normal rate.   Pulmonary/Chest: Effort normal. No respiratory distress.  Abdominal: Soft. He exhibits no distension. There is no tenderness.  Musculoskeletal: Normal range of motion. He exhibits no edema or deformity.  Neurological: He is alert and oriented to person, place, and time.  No tremors or anxiety  Skin: Skin is warm and dry.  Nursing note and vitals reviewed.    ED Treatments / Results  Labs (all labs ordered are listed, but only abnormal results are displayed) Labs Reviewed  CBG MONITORING, ED    EKG  EKG Interpretation None       Radiology No results found.  Procedures Procedures (including critical care time)  Medications Ordered in ED Medications  HYDROcodone-acetaminophen (NORCO/VICODIN) 5-325 MG per tablet 1 tablet (1 tablet Oral Given 03/20/16 1323)     Initial Impression / Assessment and Plan / ED Course  I have reviewed the triage vital signs and the nursing notes.  Pertinent labs & imaging results that were available during my care of the patient were reviewed by me and considered in my medical decision making (see chart for details).  Clinical  Course    Here from [facility] for medical clearance as he drank alcohol last night. On exam, no e/o active withdrawal (and it is too early for that). No other complaints aside from chronic pain. Social work consulted, and it seems like [facility] just wanted medical clearance, so will send back for treatment.   [facility] [place] [facility] to be medically detoxed from alcohol. Patient understands this can not be done here as he is not in active withdrawal or DT's. Outpatient resources provided.   Final Clinical Impressions(s) / ED Diagnoses   Final diagnoses:  Alcoholism [facility](HCC)     Marily MemosJason Adrea Sherpa, MD 03/21/16 [phone]

## 2016-03-20 NOTE — Progress Notes (Signed)
CSW spoke with patient at bedside and provided resources for outpatient and residential treatment services. CSW explained to patient places highlighted would accept his Medicaid insurance and to call the places of his choice of treatment.  Elenore PaddyLaVonia Sharlotte Baka, LCSWA [phone] ED CSW 03/20/2016 2:18 PM

## 2016-05-22 ENCOUNTER — Observation Stay (HOSPITAL_COMMUNITY)
Admission: EM | Admit: 2016-05-22 | Discharge: 2016-05-23 | Disposition: A | Discharge status: Home / Self Care | Payer: Medicaid Other | Attending: Internal Medicine | Admitting: Internal Medicine

## 2016-05-22 ENCOUNTER — Encounter (HOSPITAL_COMMUNITY): Payer: Self-pay

## 2016-05-22 DIAGNOSIS — E119 Type 2 diabetes mellitus without complications: Secondary | ICD-10-CM | POA: Insufficient documentation

## 2016-05-22 DIAGNOSIS — E860 Dehydration: Secondary | ICD-10-CM | POA: Diagnosis present

## 2016-05-22 DIAGNOSIS — I959 Hypotension, unspecified: Secondary | ICD-10-CM | POA: Diagnosis present

## 2016-05-22 DIAGNOSIS — R55 Syncope and collapse: Secondary | ICD-10-CM | POA: Diagnosis present

## 2016-05-22 DIAGNOSIS — D72819 Decreased white blood cell count, unspecified: Secondary | ICD-10-CM | POA: Diagnosis present

## 2016-05-22 DIAGNOSIS — E869 Volume depletion, unspecified: Secondary | ICD-10-CM

## 2016-05-22 DIAGNOSIS — F1010 Alcohol abuse, uncomplicated: Secondary | ICD-10-CM | POA: Diagnosis present

## 2016-05-22 DIAGNOSIS — N179 Acute kidney failure, unspecified: Secondary | ICD-10-CM | POA: Diagnosis not present

## 2016-05-22 DIAGNOSIS — E872 Acidosis, unspecified: Secondary | ICD-10-CM | POA: Diagnosis present

## 2016-05-22 DIAGNOSIS — Z79899 Other long term (current) drug therapy: Secondary | ICD-10-CM | POA: Insufficient documentation

## 2016-05-22 DIAGNOSIS — F1020 Alcohol dependence, uncomplicated: Secondary | ICD-10-CM | POA: Insufficient documentation

## 2016-05-22 DIAGNOSIS — D709 Neutropenia, unspecified: Secondary | ICD-10-CM

## 2016-05-22 DIAGNOSIS — F17210 Nicotine dependence, cigarettes, uncomplicated: Secondary | ICD-10-CM | POA: Diagnosis not present

## 2016-05-22 LAB — CBC
HEMATOCRIT: 38.4 % — AB (ref 39.0–52.0)
Hemoglobin: 13.5 g/dL (ref 13.0–17.0)
MCH: 35.4 pg — ABNORMAL HIGH (ref 26.0–34.0)
MCHC: 35.2 g/dL (ref 30.0–36.0)
MCV: 100.8 fL — ABNORMAL HIGH (ref 78.0–100.0)
PLATELETS: 127 10*3/uL — AB (ref 150–400)
RBC: 3.81 MIL/uL — ABNORMAL LOW (ref 4.22–5.81)
RDW: 13.9 % (ref 11.5–15.5)
WBC: 3.3 10*3/uL — AB (ref 4.0–10.5)

## 2016-05-22 LAB — BASIC METABOLIC PANEL
Anion gap: 9 (ref 5–15)
BUN: 8 mg/dL (ref 6–20)
CO2: 25 mmol/L (ref 22–32)
CREATININE: 1.51 mg/dL — AB (ref 0.61–1.24)
Calcium: 8.5 mg/dL — ABNORMAL LOW (ref 8.9–10.3)
Chloride: 101 mmol/L (ref 101–111)
GFR calc Af Amer: 56 mL/min — ABNORMAL LOW (ref >60)
GFR, EST NON AFRICAN AMERICAN: 48 mL/min — AB (ref >60)
GLUCOSE: 97 mg/dL (ref 65–99)
POTASSIUM: 3.8 mmol/L (ref 3.5–5.1)
SODIUM: 135 mmol/L (ref 135–145)

## 2016-05-22 LAB — URINALYSIS, ROUTINE W REFLEX MICROSCOPIC
Bilirubin Urine: NEGATIVE
GLUCOSE, UA: NEGATIVE mg/dL
Hgb urine dipstick: NEGATIVE
Ketones, ur: NEGATIVE mg/dL
NITRITE: NEGATIVE
PROTEIN: NEGATIVE mg/dL
SPECIFIC GRAVITY, URINE: 1.008 (ref 1.005–1.030)
pH: 5 (ref 5.0–8.0)

## 2016-05-22 LAB — CBG MONITORING, ED: GLUCOSE-CAPILLARY: 122 mg/dL — AB (ref 65–99)

## 2016-05-22 LAB — I-STAT TROPONIN, ED: Troponin i, poc: 0.06 ng/mL (ref 0.00–0.08)

## 2016-05-22 LAB — RAPID URINE DRUG SCREEN, HOSP PERFORMED
Amphetamines: NOT DETECTED
BARBITURATES: POSITIVE — AB
Benzodiazepines: NOT DETECTED
Cocaine: NOT DETECTED
Opiates: NOT DETECTED
TETRAHYDROCANNABINOL: NOT DETECTED

## 2016-05-22 LAB — ETHANOL: Alcohol, Ethyl (B): 100 mg/dL — ABNORMAL HIGH (ref <5)

## 2016-05-22 LAB — I-STAT CG4 LACTIC ACID, ED
LACTIC ACID, VENOUS: 3.07 mmol/L — AB (ref 0.5–1.9)
LACTIC ACID, VENOUS: 2.7 mmol/L — AB (ref 0.5–1.9)

## 2016-05-22 MED ORDER — GABAPENTIN 300 MG PO CAPS
300.0000 mg | ORAL_CAPSULE | Freq: Three times a day (TID) | ORAL | Status: DC
  Administered 2016-05-23 (×2): 300 mg via ORAL
  Filled 2016-05-22 (×2): qty 1

## 2016-05-22 MED ORDER — DEXTROSE 5 % IV SOLN
1.0000 g | INTRAVENOUS | Status: DC
  Filled 2016-05-22: qty 10

## 2016-05-22 MED ORDER — ONDANSETRON HCL 4 MG PO TABS: 4.0000 mg | ORAL_TABLET | Freq: Four times a day (QID) | ORAL | Status: DC | PRN

## 2016-05-22 MED ORDER — ONDANSETRON HCL 4 MG/2ML IJ SOLN: 4.0000 mg | Freq: Four times a day (QID) | INTRAMUSCULAR | Status: DC | PRN

## 2016-05-22 MED ORDER — DEXTROSE 5 % IV SOLN
1.0000 g | Freq: Once | INTRAVENOUS | Status: AC
  Administered 2016-05-22: 1 g via INTRAVENOUS
  Filled 2016-05-22: qty 10

## 2016-05-22 MED ORDER — OXYCODONE-ACETAMINOPHEN 5-325 MG PO TABS
1.0000 | ORAL_TABLET | Freq: Once | ORAL | Status: AC
  Administered 2016-05-22: 1 via ORAL
  Filled 2016-05-22: qty 1

## 2016-05-22 MED ORDER — SODIUM CHLORIDE 0.9 % IV BOLUS (SEPSIS)
1000.0000 mL | Freq: Once | INTRAVENOUS | Status: AC
  Administered 2016-05-22: 1000 mL via INTRAVENOUS

## 2016-05-22 MED ORDER — ENOXAPARIN SODIUM 40 MG/0.4ML ~~LOC~~ SOLN: 40.0000 mg | SUBCUTANEOUS | Status: DC

## 2016-05-22 MED ORDER — ALBUTEROL SULFATE (2.5 MG/3ML) 0.083% IN NEBU: 2.5000 mg | INHALATION_SOLUTION | RESPIRATORY_TRACT | Status: DC | PRN

## 2016-05-22 MED ORDER — LORAZEPAM 1 MG PO TABS: 1.0000 mg | ORAL_TABLET | Freq: Four times a day (QID) | ORAL | Status: DC | PRN

## 2016-05-22 MED ORDER — FOLIC ACID 1 MG PO TABS
1.0000 mg | ORAL_TABLET | Freq: Every day | ORAL | Status: DC
  Administered 2016-05-23: 1 mg via ORAL
  Filled 2016-05-22: qty 1

## 2016-05-22 MED ORDER — SODIUM CHLORIDE 0.9 % IV SOLN
INTRAVENOUS | Status: AC
  Administered 2016-05-22: via INTRAVENOUS

## 2016-05-22 MED ORDER — ADULT MULTIVITAMIN W/MINERALS CH
1.0000 | ORAL_TABLET | Freq: Every day | ORAL | Status: DC
  Administered 2016-05-23: 1 via ORAL
  Filled 2016-05-22: qty 1

## 2016-05-22 MED ORDER — LORAZEPAM 2 MG/ML IJ SOLN: 1.0000 mg | Freq: Four times a day (QID) | INTRAMUSCULAR | Status: DC | PRN

## 2016-05-22 MED ORDER — VITAMIN B-1 100 MG PO TABS
100.0000 mg | ORAL_TABLET | Freq: Every day | ORAL | Status: DC
  Administered 2016-05-23: 100 mg via ORAL
  Filled 2016-05-22: qty 1

## 2016-05-22 MED ORDER — THIAMINE HCL 100 MG/ML IJ SOLN: 100.0000 mg | Freq: Every day | INTRAMUSCULAR | Status: DC

## 2016-05-22 NOTE — ED Notes (Signed)
Phlebotomy at bedside.

## 2016-05-22 NOTE — ED Triage Notes (Signed)
Pt arrives vis [facility] c/o hypotension and a syncopal episode today. EMS found pt on the floor upon arrival, pt stats he was getting out of a chair when he felt dizzy and passed out "for about 10 seconds" Pt reports he is out of his lisinopril but took one from a friend, 5 mg about an hour ago. Pt denies and Cp, SHOB. NAD at this time

## 2016-05-22 NOTE — H&P (Signed)
History and Physical    Dennis Zhang ZOX:[id number] DOB: 1955-12-27 DOA: 05/22/2016  Referring MD/NP/PA:  Dr. Cathren Laine PCP: [name], MD  Patient coming from: Shelter  Chief Complaint:  Dizziness and feeling lightheaded  HPI: Dennis Zhang is a 61 y.o. male with medical history significant of HTN, DM type 2, alcohol abuse, chronic pain; who presents after reportedly passing out. Patient reports the last 2 days or so that he's been feeling very dizzy as though he were going to pass out. Patient normally ambulates with the assistance of a cane. States that he had gone to go get something to eat and in coming back passed out. Patient reports sitting down in a chair near the shelter which he currently is staying and notes about dizzy again and passed out for about 10 seconds or so before regaining consciousness. Associated symptoms include this one episode of nausea vomiting, urinary frequency, and shortness of breath with exertion. He reports a history of chronic low back pain. Denies any dysuria, discharge, fever, chills, or chest pain. Patient notes that he ran out of his lisinopril and taken another person's Lisinopril 5mg . he reports that he continues to drink and last drank two 40oz beers on New Year's Eve night.  ED Course: Upon admission into the emergency department patient was seen to  be afebrile, pulse up to 101, respirations of 24, blood pressure as low as 71/41. Blood pressure improved with IV fluids. Lab work revealed WBC 3.3, platelets 127, creatinine 1.51, lactic acid 3.07, UDS positive for barbiturates,  and alcohol level 100. Urinalysis was positive for rare bacteria, trace leukocyte Estrace, and 6-30 WBC cells. Patient was started on Rocephin IV and given IV fluids of normal saline.  Review of Systems: As per HPI otherwise 10 point review of systems negative.   Past Medical History:  Diagnosis Date  . Arthritis   . Chronic pain   . Chronic pain   . Diabetes mellitus  without complication (HCC)    borderline  . Hyperlipidemia   . Hypertension   . Seizures (HCC)     Past Surgical History:  Procedure Laterality Date  . ABDOMINAL SURGERY    . APPENDECTOMY    . HEMORROIDECTOMY       reports that he has been smoking Cigarettes.  He has a 15.00 pack-year smoking history. He has never used smokeless tobacco. He reports that he drinks alcohol. He reports that he uses drugs, including Marijuana.  Allergies  Allergen Reactions  . Ace Inhibitors     Kidney injury 05/2014-do not Rx  . Aspirin Nausea Only and Other (See Comments)    Reaction to Bayer aspirin - causes acid reflux and nausea  . Tylenol [Acetaminophen] Nausea Only    States can take Tylenol if has other pain med w/it - like Hydrocodone     Family History  Problem Relation Age of Onset  . Hypertension Mother   . Migraines Sister   . Heart failure Brother   . Migraines Brother     Prior to Admission medications   Medication Sig Start Date End Date Taking? Authorizing Provider  acetaminophen (TYLENOL) 500 MG tablet Take 500 mg by mouth 3 (three) times daily as needed (for pain.).    Yes Historical Provider, MD  gabapentin (NEURONTIN) 300 MG capsule Take 1 capsule (300 mg total) by mouth 3 (three) times daily. Patient taking differently: Take 300 mg by mouth 4 (four) times daily as needed (for nerve pain.).  07/31/12  Yes  Catherine Schinlever, PA-C  lisinopril (PRINIVIL,ZESTRIL) 5 MG tablet Take 5 mg by mouth daily.    Yes Historical Provider, MD  Magnesium Oxide 420 MG TABS Take 420 mg by mouth daily.    Yes Historical Provider, MD  Multiple Vitamin (MULTIVITAMIN WITH MINERALS) TABS tablet Take 1 tablet by mouth daily.   Yes Historical Provider, MD  Naphazoline HCl (CLEAR EYES OP) Place 1 drop into both eyes daily.   Yes Historical Provider, MD  sildenafil (VIAGRA) 100 MG tablet Take 100 mg by mouth daily as needed for erectile dysfunction.   Yes Historical Provider, MD  feeding  supplement, ENSURE COMPLETE, (ENSURE COMPLETE) LIQD Take 237 mLs by mouth 3 (three) times daily between meals. Patient not taking: Reported on 05/22/2016 06/16/14   Rhetta MuraJai-Gurmukh Samtani, MD  levETIRAcetam (KEPPRA) 500 MG tablet Take 1 tablet (500 mg total) by mouth 2 (two) times daily. Patient not taking: Reported on 05/22/2016 04/30/15   Melene Planan Floyd, DO  naproxen (NAPROSYN) 375 MG tablet Take 1 tablet (375 mg total) by mouth 2 (two) times daily. Patient not taking: Reported on 05/22/2016 11/23/15   Gilda Creasehristopher J Pollina, MD    Physical Exam:  Constitutional: Thin male who appears older than stated age in no acute distress at this time. Vitals:   05/22/16 2100 05/22/16 2130 05/22/16 2200 05/22/16 2230  BP: 137/81 134/71 121/74 116/70  Pulse:  99 84 87  Resp: 21 15 19 19   Temp:      TempSrc:      SpO2: 99% 99% 99% 100%  Weight:      Height:       Eyes: PERRL, lids and conjunctivae normal ENMT: Mucous membranes are dry. Posterior pharynx clear of any exudate or lesions. Poor dentition.  Neck: normal, supple, no masses, no thyromegaly Respiratory: clear to auscultation bilaterally, no wheezing, no crackles. Normal respiratory effort. No accessory muscle use.  Cardiovascular: Regular rate and rhythm, no murmurs / rubs / gallops. No extremity edema. 2+ pedal pulses. No carotid bruits.  Abdomen: no tenderness, no masses palpated. No hepatosplenomegaly. Bowel sounds positive.  Musculoskeletal: no clubbing / cyanosis. No joint deformity upper and lower extremities. Good ROM, no contractures. Normal muscle tone.  Skin: no rashes, lesions, ulcers. No induration Neurologic: CN 2-12 grossly intact. Sensation intact, DTR normal. Strength 5/5 in all 4.  Psychiatric:  Poor judgment and normal mood.  Labs on Admission: I have personally reviewed following labs and imaging studies  CBC:  Recent Labs Lab 05/22/16 1730  WBC 3.3*  HGB 13.5  HCT 38.4*  MCV 100.8*  PLT 127*   Basic Metabolic  Panel:  Recent Labs Lab 05/22/16 1730  NA 135  K 3.8  CL 101  CO2 25  GLUCOSE 97  BUN 8  CREATININE 1.51*  CALCIUM 8.5*   GFR: Estimated Creatinine Clearance: 52.1 mL/min (by C-G formula based on SCr of 1.51 mg/dL (H)). Liver Function Tests: No results for input(s): AST, ALT, ALKPHOS, BILITOT, PROT, ALBUMIN in the last 168 hours. No results for input(s): LIPASE, AMYLASE in the last 168 hours. No results for input(s): AMMONIA in the last 168 hours. Coagulation Profile: No results for input(s): INR, PROTIME in the last 168 hours. Cardiac Enzymes: No results for input(s): CKTOTAL, CKMB, CKMBINDEX, TROPONINI in the last 168 hours. BNP (last 3 results) No results for input(s): PROBNP in the last 8760 hours. HbA1C: No results for input(s): HGBA1C in the last 72 hours. CBG:  Recent Labs Lab 05/22/16 1619  GLUCAP 122*  Lipid Profile: No results for input(s): CHOL, HDL, LDLCALC, TRIG, CHOLHDL, LDLDIRECT in the last 72 hours. Thyroid Function Tests: No results for input(s): TSH, T4TOTAL, FREET4, T3FREE, THYROIDAB in the last 72 hours. Anemia Panel: No results for input(s): VITAMINB12, FOLATE, FERRITIN, TIBC, IRON, RETICCTPCT in the last 72 hours. Urine analysis:    Component Value Date/Time   COLORURINE YELLOW 05/22/2016 1658   APPEARANCEUR CLEAR 05/22/2016 1658   LABSPEC 1.008 05/22/2016 1658   PHURINE 5.0 05/22/2016 1658   GLUCOSEU NEGATIVE 05/22/2016 1658   HGBUR NEGATIVE 05/22/2016 1658   BILIRUBINUR NEGATIVE 05/22/2016 1658   KETONESUR NEGATIVE 05/22/2016 1658   PROTEINUR NEGATIVE 05/22/2016 1658   UROBILINOGEN 1.0 06/14/2014 1847   NITRITE NEGATIVE 05/22/2016 1658   LEUKOCYTESUR TRACE (A) 05/22/2016 1658   Sepsis Labs: No results found for this or any previous visit (from the past 240 hour(s)).   Radiological Exams on Admission: No results found.  EKG: Independently reviewed. Sinus rhythm  Assessment/Plan Syncope and collapse: Acute. Patient is after  having syncope and collapse. Similar presentation noted per review of records. - Admit to a telemetry bed - Check orthostatics and TSH - Trend cardiac enzymes  Possible urinary tract infection: Acute. Patient complains of urinary frequency. UA questionably positive with 6-30 WBCs, rare bacteria, and trace leukocyte esterase. - Follow-up urine culture - continue rocephin, de-escalate when able  Lactic acidosis: Acute. Lactic acid level 3.07 on admission. Suspected secondary to dehydration. - Trend lactic evels  Acute kidney injury 2/2 suspected dehydration: Baseline creatinine noted to be 0.96 previously. Patient presents with elevated creatinine of 1.51 and a BUN of 8 on admission. Patient endorses complaints of nausea or vomiting. Other causes on differential include possibility of urinary retention/BPH. - IVF NS at 164ml/hr - bladder scan question possibility urinary retention  - recheck BMP in am - avoiding nephrotoxic agents at this time  Transient Hypotension/ hx of Essential hypertension: Suspect patient is dehydrated. - held lisinopril  Alcohol abuse with acute intoxication: Initial alcohol level noted to be 100. - [facility] protocols  Positive UDS for barbiturates: Acute. Patient denies drug use. - may want to question further in a.m.  Chronic pain: Patient notes a history of chronic lower back - Continue gabapentin - Tramadol added prn pain  Leukopenia: Acute on chronic. WBC 3.3 - Continue to monitor  Thrombocytopenia: Chronic. Platelet count noted to be 127 on admission previously seen as low as 35 in the past. Suspect secondary to patient's history of alcohol abuse. - Continue to monitor no acute signs of bleeding at this time  History of seizure disorder: Patient previously noted to be on Keppra, but patient has not taken this medicine in over a year.   DVT prophylaxis:  lovenox  Code Status:  full Family Communication: No family present at bedside  Disposition Plan:  TBD Consults called: none Admission status: observation  Clydie Braun MD [facility] Pager [phone]  If 7PM-7AM, please contact night-coverage [email or web address] Password [facility]  05/22/2016, 10:43 PM

## 2016-05-22 NOTE — ED Notes (Signed)
Pt requesting pain medication, Dr Denton LankSteinl notified.

## 2016-05-22 NOTE — ED Provider Notes (Signed)
[facility]-EMERGENCY DEPT Provider Note   CSN: [id number] Arrival date & time: 05/22/16  1537     History   Chief Complaint Chief Complaint  Patient presents with  . Hypotension  . Near Syncope    HPI Rodrigues Urbanek is a 61 y.o. male.  Patient c/o feeling lightheaded/dizzy today, and states passed out for a short while.  Hx same.  States relatively poor po intake in past 1-2 days. Denies chest pain or discomfort. No palpitations. Denies sob. No abd pain. Denies vomiting or diarrhea. Denies any recent blood loss, no melena or hematochezia. Denies headache. No fever or chills. No uri c/o. No dysuria or gu c/o. States out of his bp meds, so he took a friends.    The history is provided by the patient.  Near Syncope  Pertinent negatives include no chest pain, no abdominal pain, no headaches and no shortness of breath.    Past Medical History:  Diagnosis Date  . Arthritis   . Chronic pain   . Chronic pain   . 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 Hyperlipidemia   . Hypertension   . Seizures [facility])     Patient Active Problem List   Diagnosis Date Noted  . Syncope 06/15/2014  . Hypotension 06/15/2014  . Fall   . Arterial hypotension   . Weakness   . Dehydration 06/14/2014  . Protein-calorie malnutrition, severe (HCC) 05/21/2014  . Acute kidney injury (HCC) 05/20/2014  . Orthostatic hypotension 05/20/2014  . Alcohol abuse 05/20/2014  . Transaminitis 05/20/2014  . Thrombocytopenia (HCC) 05/20/2014  . Acute renal failure (HCC) 03/03/2014  . Alcohol intoxication (HCC) 03/03/2014  . Increased anion gap metabolic acidosis 03/03/2014  . Malnutrition of moderate degree (HCC) 03/03/2014  . Hypokalemia 10/06/2012  . Gram-negative bacteremia 10/06/2012  . Leukocytosis 10/03/2012  . Chronic pancreatitis (HCC) 10/03/2012  . AKI (acute kidney injury) (HCC) 10/03/2012  . Alcoholic hepatitis 10/03/2012  . Hypoglycemia secondary to sulfonylurea 10/03/2012     Past Surgical History:  Procedure Laterality Date  . ABDOMINAL SURGERY    . APPENDECTOMY    . HEMORROIDECTOMY         Home Medications    Prior to Admission medications   Medication Sig Start Date End Date Taking? Authorizing Provider  acetaminophen (TYLENOL) 500 MG tablet Take 1,000 mg by mouth every 6 (six) hours as needed (for pain.).    Historical Provider, MD  feeding supplement, ENSURE COMPLETE, (ENSURE COMPLETE) LIQD Take 237 mLs by mouth 3 (three) times daily between meals. 06/16/14   Rhetta Mura, MD  folic acid (FOLVITE) 1 MG tablet Take 1 mg by mouth daily.    Historical Provider, MD  gabapentin (NEURONTIN) 300 MG capsule Take 1 capsule (300 mg total) by mouth 3 (three) times daily. Patient taking differently: Take 300 mg by mouth 3 (three) times daily as needed (for nerve pain.).  07/31/12   Ruby Cola, PA-C  HYDROcodone-acetaminophen (NORCO) 10-325 MG tablet Take 1 tablet by mouth every 6 (six) hours as needed for moderate pain.    Historical Provider, MD  levETIRAcetam (KEPPRA) 500 MG tablet Take 1 tablet (500 mg total) by mouth 2 (two) times daily. 04/30/15   Melene Plan, DO  lisinopril (PRINIVIL,ZESTRIL) 5 MG tablet Take 2.5 mg by mouth daily.     Historical Provider, MD  Magnesium Oxide 420 MG TABS Take 840 mg by mouth daily.    Historical Provider, MD  Multiple Vitamin (MULTIVITAMIN WITH MINERALS) TABS tablet Take  1 tablet by mouth daily.    Historical Provider, MD  naproxen (NAPROSYN) 375 MG tablet Take 1 tablet (375 mg total) by mouth 2 (two) times daily. 11/23/15   Gilda Crease, MD  sildenafil (VIAGRA) 100 MG tablet Take 100 mg by mouth daily as needed for erectile dysfunction.    Historical Provider, MD    Family History Family History  Problem Relation Age of Onset  . Hypertension Mother   . Migraines Sister   . Heart failure Brother   . Migraines Brother     Social History Social History  Substance Use Topics  . Smoking status:  Current Every Day Smoker    Packs/day: 0.50    Years: 30.00    Types: Cigarettes  . Smokeless tobacco: Never Used  . Alcohol use Yes     Comment: occ     Allergies   Ace inhibitors and Tylenol [acetaminophen]   Review of Systems Review of Systems  Constitutional: Negative for chills and fever.  HENT: Negative for sore throat.   Eyes: Negative for visual disturbance.  Respiratory: Negative for cough and shortness of breath.   Cardiovascular: Positive for near-syncope. Negative for chest pain.  Gastrointestinal: Negative for abdominal pain, blood in stool, diarrhea and vomiting.  Genitourinary: Negative for flank pain.  Musculoskeletal: Negative for back pain and neck pain.  Skin: Negative for rash.  Neurological: Positive for syncope and light-headedness. Negative for numbness and headaches.  Hematological: Does not bruise/bleed easily.  Psychiatric/Behavioral: Negative for confusion.     Physical Exam Updated Vital Signs BP (!) 82/56   Pulse 86   Temp 98 F (36.7 C) (Oral)   Resp 22   Ht 6\' 2"  (1.88 m)   Wt 71.7 kg   SpO2 98%   BMI 20.29 kg/m   Physical Exam  Constitutional: He is oriented to person, place, and time. He appears well-developed and well-nourished. No distress.  HENT:  Head: Atraumatic.  Mouth/Throat: Oropharynx is clear and moist.  Eyes: Conjunctivae are normal. Pupils are equal, round, and reactive to light. No scleral icterus.  Neck: Neck supple. No tracheal deviation present.  No bruits.   Cardiovascular: Normal rate, regular rhythm, normal heart sounds and intact distal pulses.  Exam reveals no gallop and no friction rub.   No murmur heard. Pulmonary/Chest: Effort normal and breath sounds normal. No accessory muscle usage. No respiratory distress.  Abdominal: Soft. Bowel sounds are normal. He exhibits no distension and no mass. There is no tenderness. There is no rebound and no guarding. No hernia.  Genitourinary:  Genitourinary Comments: No  cva tenderness  Musculoskeletal: He exhibits no edema.  CTLS spine, non tender, aligned, no step off. Good rom bil ext without pain or focal bony tenderness.   Neurological: He is alert and oriented to person, place, and time.  Steady gait.   Skin: Skin is warm and dry. He is not diaphoretic.  Psychiatric: He has a normal mood and affect.  Nursing note and vitals reviewed.    ED Treatments / Results  Labs (all labs ordered are listed, but only abnormal results are displayed) Results for orders placed or performed during the hospital encounter of 05/22/16  Basic metabolic panel  Result Value Ref Range   Sodium 135 135 - 145 mmol/L   Potassium 3.8 3.5 - 5.1 mmol/L   Chloride 101 101 - 111 mmol/L   CO2 25 22 - 32 mmol/L   Glucose, Bld 97 65 - 99 mg/dL   BUN 8  6 - 20 mg/dL   Creatinine, Ser 1.611.51 (H) 0.61 - 1.24 mg/dL   Calcium 8.5 (L) 8.9 - 10.3 mg/dL   GFR calc non Af Amer 48 (L) >60 mL/min   GFR calc Af Amer 56 (L) >60 mL/min   Anion gap 9 5 - 15  CBC  Result Value Ref Range   WBC 3.3 (L) 4.0 - 10.5 K/uL   RBC 3.81 (L) 4.22 - 5.81 MIL/uL   Hemoglobin 13.5 13.0 - 17.0 g/dL   HCT 09.638.4 (L) 04.539.0 - 40.952.0 %   MCV 100.8 (H) 78.0 - 100.0 fL   MCH 35.4 (H) 26.0 - 34.0 pg   MCHC 35.2 30.0 - 36.0 g/dL   RDW 81.113.9 91.411.5 - 78.215.5 %   Platelets 127 (L) 150 - 400 K/uL  Urinalysis, Routine w reflex microscopic  Result Value Ref Range   Color, Urine YELLOW YELLOW   APPearance CLEAR CLEAR   Specific Gravity, Urine 1.008 1.005 - 1.030   pH 5.0 5.0 - 8.0   Glucose, UA NEGATIVE NEGATIVE mg/dL   Hgb urine dipstick NEGATIVE NEGATIVE   Bilirubin Urine NEGATIVE NEGATIVE   Ketones, ur NEGATIVE NEGATIVE mg/dL   Protein, ur NEGATIVE NEGATIVE mg/dL   Nitrite NEGATIVE NEGATIVE   Leukocytes, UA TRACE (A) NEGATIVE   RBC / HPF 0-5 0 - 5 RBC/hpf   WBC, UA 6-30 0 - 5 WBC/hpf   Bacteria, UA RARE (A) NONE SEEN   Squamous Epithelial / LPF 0-5 (A) NONE SEEN   Mucous PRESENT    Hyaline Casts, UA PRESENT     Ethanol  Result Value Ref Range   Alcohol, Ethyl (B) 100 (H) <5 mg/dL  Rapid urine drug screen (hospital performed)  Result Value Ref Range   Opiates NONE DETECTED NONE DETECTED   Cocaine NONE DETECTED NONE DETECTED   Benzodiazepines NONE DETECTED NONE DETECTED   Amphetamines NONE DETECTED NONE DETECTED   Tetrahydrocannabinol NONE DETECTED NONE DETECTED   Barbiturates POSITIVE (A) NONE DETECTED  CBG monitoring, ED  Result Value Ref Range   Glucose-Capillary 122 (H) 65 - 99 mg/dL   Comment 1 Notify RN    Comment 2 Document in Chart   I-Stat CG4 Lactic Acid, ED  (not at  [facility])  Result Value Ref Range   Lactic Acid, Venous 3.07 (HH) 0.5 - 1.9 mmol/L   Comment NOTIFIED PHYSICIAN   I-stat troponin, ED  Result Value Ref Range   Troponin i, poc 0.06 0.00 - 0.08 ng/mL   Comment 3          I-Stat CG4 Lactic Acid, ED  ([facility])  Result Value Ref Range   Lactic Acid, Venous 2.70 (HH) 0.5 - 1.9 mmol/L   Comment NOTIFIED PHYSICIAN     EKG  EKG Interpretation  Date/Time:  Monday May 22 2016 15:47:09 EST Ventricular Rate:  94 PR Interval:    QRS Duration: 77 QT Interval:  393 QTC Calculation: 492 R Axis:   -88 Text Interpretation:  Sinus rhythm No significant change since last tracing Confirmed by Denton LankSTEINL  MD, Caryn BeeKEVIN ([id number]) on 05/22/2016 4:17:55 PM       Radiology No results found.  Procedures Procedures (including critical care time)  Medications Ordered in ED Medications - No data to display   Initial Impression / Assessment and Plan / ED Course  I have reviewed the triage vital signs and the nursing notes.  Pertinent labs & imaging results that were available during my care of the patient  were reviewed by me and considered in my medical decision making (see chart for details).  Clinical Course     Iv ns bolus. Labs sent.  Reviewed nursing notes and prior charts for additional history.   Additional iv ns bolus.  Recheck abd soft nt.  Given syncope,  hypotension, aki, feel patient requires admission for ivf, serial assessment, repeat labs, monitoring.   On review prior charts, admit in 2016 for similar symptoms, related to poor po intake, etoh abuse, dehydration.    Patient afeb, no source of infection - feel low bp, elev lactate due to volume depletion/dehydration and not sepsis.   Hospitalists consulted for admission.  ua with 6-30 wbc, will cx and rx. No dysuria or gu symptoms.     Final Clinical Impressions(s) / ED Diagnoses   Final diagnoses:  None    New Prescriptions New Prescriptions   No medications on file     Cathren Laine, MD 05/22/16 2222

## 2016-05-23 ENCOUNTER — Encounter (HOSPITAL_COMMUNITY): Payer: Self-pay

## 2016-05-23 DIAGNOSIS — D72819 Decreased white blood cell count, unspecified: Secondary | ICD-10-CM | POA: Diagnosis present

## 2016-05-23 DIAGNOSIS — R55 Syncope and collapse: Secondary | ICD-10-CM

## 2016-05-23 DIAGNOSIS — E872 Acidosis, unspecified: Secondary | ICD-10-CM | POA: Diagnosis present

## 2016-05-23 LAB — CBC
HEMATOCRIT: 33.9 % — AB (ref 39.0–52.0)
HEMOGLOBIN: 11.7 g/dL — AB (ref 13.0–17.0)
MCH: 35 pg — ABNORMAL HIGH (ref 26.0–34.0)
MCHC: 34.5 g/dL (ref 30.0–36.0)
MCV: 101.5 fL — AB (ref 78.0–100.0)
Platelets: 129 10*3/uL — ABNORMAL LOW (ref 150–400)
RBC: 3.34 MIL/uL — ABNORMAL LOW (ref 4.22–5.81)
RDW: 14.1 % (ref 11.5–15.5)
WBC: 3.3 10*3/uL — ABNORMAL LOW (ref 4.0–10.5)

## 2016-05-23 LAB — MAGNESIUM: Magnesium: 1.4 mg/dL — ABNORMAL LOW (ref 1.7–2.4)

## 2016-05-23 LAB — LACTIC ACID, PLASMA
Lactic Acid, Venous: 1.2 mmol/L (ref 0.5–1.9)
Lactic Acid, Venous: 1.2 mmol/L (ref 0.5–1.9)

## 2016-05-23 LAB — COMPREHENSIVE METABOLIC PANEL
ALBUMIN: 2.6 g/dL — AB (ref 3.5–5.0)
ALT: 14 U/L — ABNORMAL LOW (ref 17–63)
ANION GAP: 5 (ref 5–15)
AST: 49 U/L — ABNORMAL HIGH (ref 15–41)
Alkaline Phosphatase: 97 U/L (ref 38–126)
BILIRUBIN TOTAL: 1.2 mg/dL (ref 0.3–1.2)
BUN: 7 mg/dL (ref 6–20)
CHLORIDE: 105 mmol/L (ref 101–111)
CO2: 27 mmol/L (ref 22–32)
Calcium: 7.8 mg/dL — ABNORMAL LOW (ref 8.9–10.3)
Creatinine, Ser: 1.1 mg/dL (ref 0.61–1.24)
GFR calc Af Amer: >60 mL/min (ref >60)
GFR calc non Af Amer: >60 mL/min (ref >60)
GLUCOSE: 83 mg/dL (ref 65–99)
POTASSIUM: 3.9 mmol/L (ref 3.5–5.1)
SODIUM: 137 mmol/L (ref 135–145)
TOTAL PROTEIN: 6.8 g/dL (ref 6.5–8.1)

## 2016-05-23 LAB — CG4 I-STAT (LACTIC ACID): Lactic Acid, Venous: 2.98 mmol/L (ref 0.5–1.9)

## 2016-05-23 LAB — TSH: TSH: 0.645 u[IU]/mL (ref 0.350–4.500)

## 2016-05-23 LAB — TROPONIN I: Troponin I: <0.03 ng/mL

## 2016-05-23 MED ORDER — THIAMINE HCL 100 MG PO TABS: 100.0000 mg | ORAL_TABLET | Freq: Every day | ORAL | 0 refills | Status: DC

## 2016-05-23 MED ORDER — PANTOPRAZOLE SODIUM 40 MG PO TBEC: 40.0000 mg | DELAYED_RELEASE_TABLET | Freq: Every day | ORAL | 0 refills | Status: DC

## 2016-05-23 MED ORDER — PANTOPRAZOLE SODIUM 40 MG PO TBEC
40.0000 mg | DELAYED_RELEASE_TABLET | Freq: Every day | ORAL | Status: DC
  Administered 2016-05-23: 40 mg via ORAL
  Filled 2016-05-23: qty 1

## 2016-05-23 MED ORDER — CEPHALEXIN 500 MG PO CAPS
500.0000 mg | ORAL_CAPSULE | Freq: Two times a day (BID) | ORAL | 0 refills | Status: AC
Start: 2016-05-23 — End: 2016-05-27

## 2016-05-23 MED ORDER — TRAMADOL HCL 50 MG PO TABS
50.0000 mg | ORAL_TABLET | Freq: Four times a day (QID) | ORAL | Status: DC | PRN
  Administered 2016-05-23 (×2): 50 mg via ORAL
  Filled 2016-05-23 (×2): qty 1

## 2016-05-23 MED ORDER — TAMSULOSIN HCL 0.4 MG PO CAPS: 0.4000 mg | ORAL_CAPSULE | Freq: Every day | ORAL | 0 refills | Status: DC

## 2016-05-23 MED ORDER — LEVETIRACETAM 500 MG PO TABS: 500.0000 mg | ORAL_TABLET | Freq: Two times a day (BID) | ORAL | 0 refills | Status: DC

## 2016-05-23 MED ORDER — FOLIC ACID 1 MG PO TABS: 1.0000 mg | ORAL_TABLET | Freq: Every day | ORAL | 0 refills | Status: DC

## 2016-05-23 MED ORDER — TRAMADOL HCL 50 MG PO TABS: 50.0000 mg | ORAL_TABLET | Freq: Four times a day (QID) | ORAL | 0 refills | Status: DC | PRN

## 2016-05-23 MED ORDER — MAGNESIUM SULFATE 2 GM/50ML IV SOLN
2.0000 g | Freq: Once | INTRAVENOUS | Status: AC
  Administered 2016-05-23: 2 g via INTRAVENOUS
  Filled 2016-05-23: qty 50

## 2016-05-23 NOTE — Discharge Summary (Signed)
Discharge Summary  Dennis Zhang WUJ:[id number] DOB: [birth year]  PCP: [name], MD  Admit date: 05/22/2016 Discharge date: 05/23/2016  Time spent: <50mins  Recommendations for Outpatient Follow-up:  1. F/u with PMD at [facility] within a week  for hospital discharge follow up, repeat cbc/bmp at follow up  Discharge Diagnoses:  Active Hospital Problems   Diagnosis Date Noted  . Syncope and collapse 05/22/2016  . Leukopenia 05/23/2016  . Lactic acidosis 05/23/2016  . Dehydration 06/14/2014  . Alcohol abuse 05/20/2014  . AKI (acute kidney injury) (HCC) 10/03/2012    Resolved Hospital Problems   Diagnosis Date Noted Date Resolved  No resolved problems to display.    Discharge Condition: stable  Diet recommendation: heart healthy Stop drinking alcohol  Filed Weights   05/22/16 1545 05/23/16 0005  Weight: 71.7 kg (158 lb) 69.4 kg (153 lb 1.6 oz)    History of present illness:  Chief Complaint:  Dizziness and feeling lightheaded  HPI: Dennis Zhang is a 61 y.o. male with medical history significant of HTN, DM type 2, alcohol abuse, chronic pain; who presents after reportedly passing out. Patient reports the last 2 days or so that he's been feeling very dizzy as though he were going to pass out. Patient normally ambulates with the assistance of a cane. States that he had gone to go get something to eat and in coming back passed out. Patient reports sitting down in a chair near the shelter which he currently is staying and notes about dizzy again and passed out for about 10 seconds or so before regaining consciousness. Associated symptoms include this one episode of nausea vomiting, urinary frequency, and shortness of breath with exertion. He reports a history of chronic low back pain. Denies any dysuria, discharge, fever, chills, or chest pain. Patient notes that he ran out of his lisinopril and taken another person's Lisinopril 5mg . he reports that he continues to drink and  last drank two 40oz beers on New Year's Eve night.  ED Course: Upon admission into the emergency department patient was seen to  be afebrile, pulse up to 101, respirations of 24, blood pressure as low as 71/41. Blood pressure improved with IV fluids. Lab work revealed WBC 3.3, platelets 127, creatinine 1.51, lactic acid 3.07, UDS positive for barbiturates,  and alcohol level 100. Urinalysis was positive for rare bacteria, trace leukocyte Estrace, and 6-30 WBC cells. Patient was started on Rocephin IV and given IV fluids of normal saline.   Hospital Course:  Principal Problem:   Syncope and collapse Active Problems:   AKI (acute kidney injury) (HCC)   Alcohol abuse   Dehydration   Leukopenia   Lactic acidosis   Syncope and collapse: Acute. Patient is after having syncope and collapse. Similar presentation noted per review of records. - EKG and  telemetry unrevealing, tsh wnl, cardiac enzymes negative - he is dehydrated and orthostatics on presentation - symptom resolved after hydration. Repeat orthostatic vital sign wnl at discharge.  Possible urinary tract infection: Acute. Patient complains of urinary frequency. UA questionably positive with 6-30 WBCs, rare bacteria, and trace leukocyte esterase. - he received rocephin in the hospital, no fever, no leukocytosis - urine culture result pending at discharge, cr normalized, he is asymptomatic at discharge, he is discharged on oral keflex to finish abx treatment course.  Lactic acidosis: Acute. Lactic acid level 3.07 on admission. Suspected secondary to dehydration. - normalized with hydration  Acute kidney injury 2/2 suspected dehydration: Baseline creatinine noted to be 0.96 previously.  Patient presents with elevated creatinine of 1.51 and a BUN of 8 on admission. Patient endorses complaints of nausea or vomiting. Other causes on differential include possibility of urinary retention/BPH. -  bladder scan question possibility urinary  retention ,  - cr normalized,  -he is prescribed with flomax, f/u with pmd.  Transient Hypotension/ hx of Essential hypertension: Suspect patient is dehydrated. - lisinopril held in the hospital , discontinued at discharge bp wnl at discharge.   Alcohol abuse with acute intoxication: Initial alcohol level noted to be 100. - [facility]IA protocols -no overt withdrawal symptom   Positive UDS for barbiturates: Acute. Patient denies drug use.   Chronic pain: Patient notes a history of chronic lower back - Continue gabapentin - Tramadol added prn pain -total of 10tabs of tramadol prescribed at discharge, he is to follow up with pmd for chronic pain.  Leukopenia: Acute on chronic. WBC 3.3 - stable, follow up with pmd and repeat cbc at follow up.  Thrombocytopenia: Chronic. Platelet count noted to be 127 on admission previously seen as low as 35 in the past. Suspect secondary to patient's history of alcohol abuse. - no bleeding at this time -plt 129 at discharge  History of seizure disorder: Patient previously noted to be on Keppra, but patient has not taken this medicine in over a year.  keppra prescribed at discharge  DVT prophylaxis:  lovenox  Code Status:  full Family Communication: No family present at bedside  Disposition Plan: home Consults called: none   Discharge Exam: BP 123/70 (BP Location: Left Arm)   Pulse 88   Temp 97.8 F (36.6 C) (Oral)   Resp 18   Ht 6\' 2"  (1.88 m)   Wt 69.4 kg (153 lb 1.6 oz)   SpO2 100%   BMI 19.66 kg/m   General: NAD, AAOX3 Cardiovascular: RRR  Respiratory: CTABL  Discharge Instructions You were cared for by a hospitalist during your hospital stay. If you have any questions about your discharge medications or the care you received while you were in the hospital after you are discharged, you can call the unit and asked to speak with the hospitalist on call if the hospitalist that took care of you is not available. Once you are  discharged, your primary care physician will handle any further medical issues. Please note that NO REFILLS for any discharge medications will be authorized once you are discharged, as it is imperative that you return to your primary care physician (or establish a relationship with a primary care physician if you do not have one) for your aftercare needs so that they can reassess your need for medications and monitor your lab values.  Discharge Instructions    Diet - low sodium heart healthy    Complete by:  As directed    Please stop drinking alcohol   Increase activity slowly    Complete by:  As directed      Allergies as of 05/23/2016      Reactions   Ace Inhibitors    Kidney injury 05/2014-do not Rx   Aspirin Nausea Only, Other (See Comments)   Reaction to Bayer aspirin - causes acid reflux and nausea   Tylenol [acetaminophen] Nausea Only   States can take Tylenol if has other pain med w/it - like Hydrocodone      Medication List    STOP taking these medications   lisinopril 5 MG tablet Commonly known as:  PRINIVIL,ZESTRIL     TAKE these medications   acetaminophen  500 MG tablet Commonly known as:  TYLENOL Take 500 mg by mouth 3 (three) times daily as needed (for pain.).   cephALEXin 500 MG capsule Commonly known as:  KEFLEX Take 1 capsule (500 mg total) by mouth 2 (two) times daily.   CLEAR EYES OP Place 1 drop into both eyes daily.   feeding supplement (ENSURE COMPLETE) Liqd Take 237 mLs by mouth 3 (three) times daily between meals.   folic acid 1 MG tablet Commonly known as:  FOLVITE Take 1 tablet (1 mg total) by mouth daily. Start taking on:  05/24/2016   gabapentin 300 MG capsule Commonly known as:  NEURONTIN Take 1 capsule (300 mg total) by mouth 3 (three) times daily. What changed:  when to take this  reasons to take this   levETIRAcetam 500 MG tablet Commonly known as:  KEPPRA Take 1 tablet (500 mg total) by mouth 2 (two) times daily.   Magnesium  Oxide 420 MG Tabs Take 420 mg by mouth daily.   multivitamin with minerals Tabs tablet Take 1 tablet by mouth daily.   naproxen 375 MG tablet Commonly known as:  NAPROSYN Take 1 tablet (375 mg total) by mouth 2 (two) times daily.   pantoprazole 40 MG tablet Commonly known as:  PROTONIX Take 1 tablet (40 mg total) by mouth daily. Start taking on:  05/24/2016   sildenafil 100 MG tablet Commonly known as:  VIAGRA Take 100 mg by mouth daily as needed for erectile dysfunction.   tamsulosin 0.4 MG Caps capsule Commonly known as:  FLOMAX Take 1 capsule (0.4 mg total) by mouth daily after breakfast.   thiamine 100 MG tablet Take 1 tablet (100 mg total) by mouth daily. Start taking on:  05/24/2016   traMADol 50 MG tablet Commonly known as:  ULTRAM Take 1 tablet (50 mg total) by mouth every 6 (six) hours as needed for moderate pain.      Allergies  Allergen Reactions  . Ace Inhibitors     Kidney injury 05/2014-do not Rx  . Aspirin Nausea Only and Other (See Comments)    Reaction to Bayer aspirin - causes acid reflux and nausea  . Tylenol [Acetaminophen] Nausea Only    States can take Tylenol if has other pain med w/it - like Hydrocodone    Follow-up Information    ARMOUR, ROSS B, MD Follow up in 1 week(s).   Specialty:  Internal Medicine Why:  hospital discharge follow up, repeat cbc/bmp at follow up. Contact information: [address] [place] [address] [phone]            The results of significant diagnostics from this hospitalization (including imaging, microbiology, ancillary and laboratory) are listed below for reference.    Significant Diagnostic Studies: No results found.  Microbiology: No results found for this or any previous visit (from the past 240 hour(s)).   Labs: Basic Metabolic Panel:  Recent Labs Lab 05/22/16 1730 05/23/16 0528  NA 135 137  K 3.8 3.9  CL 101 105  CO2 25 27  GLUCOSE 97 83  BUN 8 7  CREATININE 1.51* 1.10    CALCIUM 8.5* 7.8*  MG  --  1.4*   Liver Function Tests:  Recent Labs Lab 05/23/16 0528  AST 49*  ALT 14*  ALKPHOS 97  BILITOT 1.2  PROT 6.8  ALBUMIN 2.6*   No results for input(s): LIPASE, AMYLASE in the last 168 hours. No results for input(s): AMMONIA in the last 168 hours. CBC:  Recent Labs  Lab 05/22/16 1730 05/23/16 0528  WBC 3.3* 3.3*  HGB 13.5 11.7*  HCT 38.4* 33.9*  MCV 100.8* 101.5*  PLT 127* 129*   Cardiac Enzymes:  Recent Labs Lab 05/23/16 0217 05/23/16 0528  TROPONINI <0.03 <0.03   BNP: BNP (last 3 results) No results for input(s): BNP in the last 8760 hours.  ProBNP (last 3 results) No results for input(s): PROBNP in the last 8760 hours.  CBG:  Recent Labs Lab 05/22/16 1619  GLUCAP 122*       Signed:  Tinsley Lomas MD, PhD  [facility] 05/23/2016, 11:29 AM

## 2016-05-23 NOTE — Progress Notes (Signed)
Patient walked to the bathroom and back by himself. He stated he did not feel any dizziness.

## 2016-05-23 NOTE — Progress Notes (Signed)
Joelene MillinLevonda Landin to be D/C'd Home per MD order.  Discussed prescriptions and follow up appointments with the patient. Prescriptions given to patient, medication list explained in detail. Pt verbalized understanding.  Allergies as of 05/23/2016      Reactions   Ace Inhibitors    Kidney injury 05/2014-do not Rx   Aspirin Nausea Only, Other (See Comments)   Reaction to Bayer aspirin - causes acid reflux and nausea   Tylenol [acetaminophen] Nausea Only   States can take Tylenol if has other pain med w/it - like Hydrocodone      Medication List    STOP taking these medications   lisinopril 5 MG tablet Commonly known as:  PRINIVIL,ZESTRIL     TAKE these medications   acetaminophen 500 MG tablet Commonly known as:  TYLENOL Take 500 mg by mouth 3 (three) times daily as needed (for pain.).   cephALEXin 500 MG capsule Commonly known as:  KEFLEX Take 1 capsule (500 mg total) by mouth 2 (two) times daily.   CLEAR EYES OP Place 1 drop into both eyes daily.   feeding supplement (ENSURE COMPLETE) Liqd Take 237 mLs by mouth 3 (three) times daily between meals.   folic acid 1 MG tablet Commonly known as:  FOLVITE Take 1 tablet (1 mg total) by mouth daily. Start taking on:  05/24/2016   gabapentin 300 MG capsule Commonly known as:  NEURONTIN Take 1 capsule (300 mg total) by mouth 3 (three) times daily. What changed:  when to take this  reasons to take this   levETIRAcetam 500 MG tablet Commonly known as:  KEPPRA Take 1 tablet (500 mg total) by mouth 2 (two) times daily.   Magnesium Oxide 420 MG Tabs Take 420 mg by mouth daily.   multivitamin with minerals Tabs tablet Take 1 tablet by mouth daily.   naproxen 375 MG tablet Commonly known as:  NAPROSYN Take 1 tablet (375 mg total) by mouth 2 (two) times daily.   pantoprazole 40 MG tablet Commonly known as:  PROTONIX Take 1 tablet (40 mg total) by mouth daily. Start taking on:  05/24/2016   sildenafil 100 MG tablet Commonly  known as:  VIAGRA Take 100 mg by mouth daily as needed for erectile dysfunction.   tamsulosin 0.4 MG Caps capsule Commonly known as:  FLOMAX Take 1 capsule (0.4 mg total) by mouth daily after breakfast.   thiamine 100 MG tablet Take 1 tablet (100 mg total) by mouth daily. Start taking on:  05/24/2016   traMADol 50 MG tablet Commonly known as:  ULTRAM Take 1 tablet (50 mg total) by mouth every 6 (six) hours as needed for moderate pain.       Vitals:   05/23/16 0620 05/23/16 0940  BP: 140/87 123/70  Pulse: 69 88  Resp: 18 18  Temp: 98.2 F (36.8 C) 97.8 F (36.6 C)    Skin clean, dry and intact without evidence of skin break down, no evidence of skin tears noted. IV catheter discontinued intact. Site without signs and symptoms of complications. Dressing and pressure applied. Pt denies pain at this time. No complaints noted. Bus Ticket give to Patient upon leaving. An After Visit Summary was printed and given to the patient. Patient escorted via WC, and D/C home via [facility]  Janeann ForehandLuke Jantzen Pilger BSN, RN

## 2016-05-24 LAB — URINE CULTURE: CULTURE: NO GROWTH

## 2016-05-27 LAB — CULTURE, BLOOD (ROUTINE X 2)
CULTURE: NO GROWTH
Culture: NO GROWTH

## 2016-05-31 ENCOUNTER — Telehealth: Payer: Self-pay | Admitting: *Deleted

## 2016-05-31 NOTE — Telephone Encounter (Signed)
Pt called from pharmacy because he did not have physical Rx in hand.  EDCM reviewed chart to find that Rx was E-scribed.  EDCM spoke with Pharm D on duty and he "found" Rx in system; will fill Rx promptly.

## 2016-06-13 ENCOUNTER — Emergency Department (HOSPITAL_COMMUNITY)
Admission: EM | Admit: 2016-06-13 | Discharge: 2016-06-13 | Disposition: A | Discharge status: Home / Self Care | Payer: Non-veteran care | Attending: Emergency Medicine | Admitting: Emergency Medicine

## 2016-06-13 ENCOUNTER — Encounter (HOSPITAL_COMMUNITY): Payer: Self-pay | Admitting: Emergency Medicine

## 2016-06-13 DIAGNOSIS — F1010 Alcohol abuse, uncomplicated: Secondary | ICD-10-CM | POA: Diagnosis not present

## 2016-06-13 DIAGNOSIS — F17210 Nicotine dependence, cigarettes, uncomplicated: Secondary | ICD-10-CM | POA: Insufficient documentation

## 2016-06-13 DIAGNOSIS — R112 Nausea with vomiting, unspecified: Secondary | ICD-10-CM | POA: Diagnosis not present

## 2016-06-13 DIAGNOSIS — I10 Essential (primary) hypertension: Secondary | ICD-10-CM | POA: Insufficient documentation

## 2016-06-13 DIAGNOSIS — Z79899 Other long term (current) drug therapy: Secondary | ICD-10-CM | POA: Insufficient documentation

## 2016-06-13 LAB — COMPREHENSIVE METABOLIC PANEL
ALT: 19 U/L (ref 17–63)
ANION GAP: 8 (ref 5–15)
AST: 77 U/L — AB (ref 15–41)
Albumin: 3.4 g/dL — ABNORMAL LOW (ref 3.5–5.0)
Alkaline Phosphatase: 118 U/L (ref 38–126)
BILIRUBIN TOTAL: 0.7 mg/dL (ref 0.3–1.2)
BUN: 11 mg/dL (ref 6–20)
CHLORIDE: 107 mmol/L (ref 101–111)
CO2: 26 mmol/L (ref 22–32)
Calcium: 8.5 mg/dL — ABNORMAL LOW (ref 8.9–10.3)
Creatinine, Ser: 1.18 mg/dL (ref 0.61–1.24)
GFR calc Af Amer: >60 mL/min (ref >60)
Glucose, Bld: 122 mg/dL — ABNORMAL HIGH (ref 65–99)
POTASSIUM: 3.8 mmol/L (ref 3.5–5.1)
Sodium: 141 mmol/L (ref 135–145)
TOTAL PROTEIN: 8 g/dL (ref 6.5–8.1)

## 2016-06-13 LAB — CBC
HCT: 37.1 % — ABNORMAL LOW (ref 39.0–52.0)
HEMOGLOBIN: 13 g/dL (ref 13.0–17.0)
MCH: 35.1 pg — ABNORMAL HIGH (ref 26.0–34.0)
MCHC: 35 g/dL (ref 30.0–36.0)
MCV: 100.3 fL — AB (ref 78.0–100.0)
Platelets: 122 10*3/uL — ABNORMAL LOW (ref 150–400)
RBC: 3.7 MIL/uL — AB (ref 4.22–5.81)
RDW: 14 % (ref 11.5–15.5)
WBC: 3.2 10*3/uL — AB (ref 4.0–10.5)

## 2016-06-13 LAB — RAPID URINE DRUG SCREEN, HOSP PERFORMED
AMPHETAMINES: NOT DETECTED
BARBITURATES: NOT DETECTED
BENZODIAZEPINES: NOT DETECTED
COCAINE: NOT DETECTED
OPIATES: NOT DETECTED
TETRAHYDROCANNABINOL: NOT DETECTED

## 2016-06-13 LAB — URINALYSIS, ROUTINE W REFLEX MICROSCOPIC
Bilirubin Urine: NEGATIVE
Glucose, UA: NEGATIVE mg/dL
Hgb urine dipstick: NEGATIVE
KETONES UR: NEGATIVE mg/dL
Leukocytes, UA: NEGATIVE
NITRITE: NEGATIVE
PH: 5 (ref 5.0–8.0)
Protein, ur: NEGATIVE mg/dL
SPECIFIC GRAVITY, URINE: 1.016 (ref 1.005–1.030)

## 2016-06-13 LAB — LIPASE, BLOOD: LIPASE: 22 U/L (ref 11–51)

## 2016-06-13 LAB — ETHANOL: Alcohol, Ethyl (B): 203 mg/dL — ABNORMAL HIGH (ref <5)

## 2016-06-13 MED ORDER — LEVETIRACETAM 500 MG PO TABS: 500.0000 mg | ORAL_TABLET | Freq: Two times a day (BID) | ORAL | 0 refills | Status: DC

## 2016-06-13 MED ORDER — LEVETIRACETAM 500 MG PO TABS
500.0000 mg | ORAL_TABLET | Freq: Once | ORAL | Status: AC
  Administered 2016-06-13: 500 mg via ORAL
  Filled 2016-06-13: qty 1

## 2016-06-13 MED ORDER — ONDANSETRON 4 MG PO TBDP
4.0000 mg | ORAL_TABLET | Freq: Once | ORAL | Status: AC
  Administered 2016-06-13: 4 mg via ORAL
  Filled 2016-06-13: qty 1

## 2016-06-13 NOTE — ED Notes (Addendum)
Reviewed discharge papers and prescription with patient. Pt given opportunity to ask questions. Patient in no apparent distress. ambulatory with cane. Alert and oriented x4, offered bus pass but declined.

## 2016-06-13 NOTE — Discharge Instructions (Signed)
[facility]

## 2016-06-13 NOTE — ED Notes (Signed)
Ham sandwich and Sprite given to patient.

## 2016-06-13 NOTE — ED Provider Notes (Signed)
[facility]-EMERGENCY DEPT Provider Note   CSN: [id number] Arrival date & time: 06/13/16  1522     History   Chief Complaint Chief Complaint  Patient presents with  . Nausea  . Emesis  . Diarrhea    HPI Dennis Zhang is a 61 y.o. male.  The history is provided by the patient. No language interpreter was used.  Emesis   This is a new problem. The current episode started yesterday. The problem occurs 2 to 4 times per day. The problem has been gradually worsening. There has been no fever. Pertinent negatives include no diarrhea. Risk factors include ill contacts.  Diarrhea   Associated symptoms include vomiting.  Pt complains of chronic back pain.  Pt reports he was nauseated earlier today.  Pt reports he had beer earlier today.    Past Medical History:  Diagnosis Date  . Arthritis   . Chronic pain   . Chronic pain   . 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 Hyperlipidemia   . Hypertension   . Seizures [facility])     Patient Active Problem List   Diagnosis Date Noted  . Leukopenia 05/23/2016  . Lactic acidosis 05/23/2016  . Syncope and collapse 05/22/2016  . Syncope 06/15/2014  . Hypotension 06/15/2014  . Fall   . Arterial hypotension   . Weakness   . Dehydration 06/14/2014  . Protein-calorie malnutrition, severe (HCC) 05/21/2014  . Acute kidney injury (HCC) 05/20/2014  . Orthostatic hypotension 05/20/2014  . Alcohol abuse 05/20/2014  . Transaminitis 05/20/2014  . Thrombocytopenia (HCC) 05/20/2014  . Acute renal failure (HCC) 03/03/2014  . Alcohol intoxication (HCC) 03/03/2014  . Increased anion gap metabolic acidosis 03/03/2014  . Malnutrition of moderate degree (HCC) 03/03/2014  . Hypokalemia 10/06/2012  . Gram-negative bacteremia 10/06/2012  . Leukocytosis 10/03/2012  . Chronic pancreatitis (HCC) 10/03/2012  . AKI (acute kidney injury) (HCC) 10/03/2012  . Alcoholic hepatitis 10/03/2012  . Hypoglycemia secondary to sulfonylurea 10/03/2012      Past Surgical History:  Procedure Laterality Date  . ABDOMINAL SURGERY    . APPENDECTOMY    . HEMORROIDECTOMY         Home Medications    Prior to Admission medications   Medication Sig Start Date End Date Taking? Authorizing Provider  acetaminophen (TYLENOL) 500 MG tablet Take 500 mg by mouth 3 (three) times daily as needed (for pain.).     Historical Provider, MD  feeding supplement, ENSURE COMPLETE, (ENSURE COMPLETE) LIQD Take 237 mLs by mouth 3 (three) times daily between meals. Patient not taking: Reported on 05/22/2016 06/16/14   Rhetta Mura, MD  folic acid (FOLVITE) 1 MG tablet Take 1 tablet (1 mg total) by mouth daily. 05/24/16   Albertine Grates, MD  gabapentin (NEURONTIN) 300 MG capsule Take 1 capsule (300 mg total) by mouth 3 (three) times daily. Patient taking differently: Take 300 mg by mouth 4 (four) times daily as needed (for nerve pain.).  07/31/12   Ruby Cola, PA-C  levETIRAcetam (KEPPRA) 500 MG tablet Take 1 tablet (500 mg total) by mouth 2 (two) times daily. 05/23/16   Albertine Grates, MD  Magnesium Oxide 420 MG TABS Take 420 mg by mouth daily.     Historical Provider, MD  Multiple Vitamin (MULTIVITAMIN WITH MINERALS) TABS tablet Take 1 tablet by mouth daily.    Historical Provider, MD  Naphazoline HCl (CLEAR EYES OP) Place 1 drop into both eyes daily.    Historical Provider, MD  naproxen (NAPROSYN) 375 MG  tablet Take 1 tablet (375 mg total) by mouth 2 (two) times daily. Patient not taking: Reported on 05/22/2016 11/23/15   Gilda Creasehristopher J Pollina, MD  pantoprazole (PROTONIX) 40 MG tablet Take 1 tablet (40 mg total) by mouth daily. 05/24/16   Albertine GratesFang Xu, MD  sildenafil (VIAGRA) 100 MG tablet Take 100 mg by mouth daily as needed for erectile dysfunction.    Historical Provider, MD  tamsulosin (FLOMAX) 0.4 MG CAPS capsule Take 1 capsule (0.4 mg total) by mouth daily after breakfast. 05/23/16   Albertine GratesFang Xu, MD  thiamine 100 MG tablet Take 1 tablet (100 mg total) by mouth daily. 05/24/16    Albertine GratesFang Xu, MD  traMADol (ULTRAM) 50 MG tablet Take 1 tablet (50 mg total) by mouth every 6 (six) hours as needed for moderate pain. 05/23/16   Albertine GratesFang Xu, MD    Family History Family History  Problem Relation Age of Onset  . Hypertension Mother   . Migraines Sister   . Heart failure Brother   . Migraines Brother     Social History Social History  Substance Use Topics  . Smoking status: Current Every Day Smoker    Packs/day: 0.50    Years: 30.00    Types: Cigarettes  . Smokeless tobacco: Never Used  . Alcohol use Yes     Comment: occ     Allergies   Ace inhibitors; Aspirin; and Tylenol [acetaminophen]   Review of Systems Review of Systems  Gastrointestinal: Positive for vomiting. Negative for diarrhea.  All other systems reviewed and are negative.    Physical Exam Updated Vital Signs BP 151/94 (BP Location: Left Arm)   Pulse 75   Temp 97.9 F (36.6 C) (Oral)   Resp 20   SpO2 100%   Physical Exam  Constitutional: He appears well-developed and well-nourished.  HENT:  Head: Normocephalic and atraumatic.  Right Ear: External ear normal.  Left Ear: External ear normal.  Eyes: Conjunctivae are normal. Pupils are equal, round, and reactive to light.  Neck: Neck supple.  Cardiovascular: Normal rate, regular rhythm and normal heart sounds.   No murmur heard. Pulmonary/Chest: Effort normal and breath sounds normal. No respiratory distress.  Abdominal: Soft. There is no tenderness.  Musculoskeletal: He exhibits no edema.  Neurological: He is alert.  Skin: Skin is warm and dry.  Psychiatric: He has a normal mood and affect.  Nursing note and vitals reviewed.    ED Treatments / Results  Labs (all labs ordered are listed, but only abnormal results are displayed) Labs Reviewed  COMPREHENSIVE METABOLIC PANEL - Abnormal; Notable for the following:       Result Value   Glucose, Bld 122 (*)    Calcium 8.5 (*)    Albumin 3.4 (*)    AST 77 (*)    All other components  within normal limits  CBC - Abnormal; Notable for the following:    WBC 3.2 (*)    RBC 3.70 (*)    HCT 37.1 (*)    MCV 100.3 (*)    MCH 35.1 (*)    Platelets 122 (*)    All other components within normal limits  ETHANOL - Abnormal; Notable for the following:    Alcohol, Ethyl (B) 203 (*)    All other components within normal limits  LIPASE, BLOOD  URINALYSIS, ROUTINE W REFLEX MICROSCOPIC  RAPID URINE DRUG SCREEN, HOSP PERFORMED    EKG  EKG Interpretation None       Radiology No results found.  Procedures  Procedures (including critical care time)  Medications Ordered in ED Medications  ondansetron (ZOFRAN-ODT) disintegrating tablet 4 mg (4 mg Oral Given 06/13/16 1851)     Initial Impression / Assessment and Plan / ED Course  I have reviewed the triage vital signs and the nursing notes.  Pertinent labs & imaging results that were available during my care of the patient were reviewed by me and considered in my medical decision making (see chart for details).     Pt's alcohol is 203.  Pt is able to drink fluids without vomiting. Pt ate a sandwich without vomiting.  Pt's labs are normal.  Pt advised to follow up with his MD.  Avoid alcohol  Final Clinical Impressions(s) / ED Diagnoses   Final diagnoses:  Nausea and vomiting, intractability of vomiting not specified, unspecified vomiting type  Alcohol abuse    New Prescriptions New Prescriptions   No medications on file  An After Visit Summary was printed and given to the patient.   Lonia Skinner Max[place]wny, PA-C 06/13/16 1945    Shaune Pollack, MD 06/15/16 [phone]

## 2016-06-13 NOTE — ED Triage Notes (Signed)
Per EMS, patient from the store, c/o N/V/D x3 days. Denies abdominal pain, chest pain and SOB. Ambulatory with EMS.

## 2016-06-13 NOTE — ED Notes (Signed)
Bed: WA01 Expected date:  Expected time:  Means of arrival:  Comments: Pt getting dressed 

## 2016-06-27 ENCOUNTER — Encounter (HOSPITAL_COMMUNITY): Payer: Self-pay | Admitting: Emergency Medicine

## 2016-06-27 DIAGNOSIS — G8929 Other chronic pain: Secondary | ICD-10-CM | POA: Insufficient documentation

## 2016-06-27 DIAGNOSIS — E119 Type 2 diabetes mellitus without complications: Secondary | ICD-10-CM | POA: Diagnosis not present

## 2016-06-27 DIAGNOSIS — F17210 Nicotine dependence, cigarettes, uncomplicated: Secondary | ICD-10-CM | POA: Insufficient documentation

## 2016-06-27 DIAGNOSIS — I10 Essential (primary) hypertension: Secondary | ICD-10-CM | POA: Diagnosis not present

## 2016-06-27 DIAGNOSIS — R531 Weakness: Secondary | ICD-10-CM | POA: Insufficient documentation

## 2016-06-27 LAB — COMPREHENSIVE METABOLIC PANEL
ALK PHOS: 141 U/L — AB (ref 38–126)
ALT: 38 U/L (ref 17–63)
AST: 137 U/L — ABNORMAL HIGH (ref 15–41)
Albumin: 3.8 g/dL (ref 3.5–5.0)
Anion gap: 10 (ref 5–15)
BUN: 11 mg/dL (ref 6–20)
CALCIUM: 9 mg/dL (ref 8.9–10.3)
CO2: 31 mmol/L (ref 22–32)
CREATININE: 1.29 mg/dL — AB (ref 0.61–1.24)
Chloride: 97 mmol/L — ABNORMAL LOW (ref 101–111)
GFR calc Af Amer: >60 mL/min (ref >60)
GFR, EST NON AFRICAN AMERICAN: 58 mL/min — AB (ref >60)
Glucose, Bld: 114 mg/dL — ABNORMAL HIGH (ref 65–99)
Potassium: 3.4 mmol/L — ABNORMAL LOW (ref 3.5–5.1)
Sodium: 138 mmol/L (ref 135–145)
TOTAL PROTEIN: 9.2 g/dL — AB (ref 6.5–8.1)
Total Bilirubin: 1.5 mg/dL — ABNORMAL HIGH (ref 0.3–1.2)

## 2016-06-27 LAB — CBC
HCT: 41.7 % (ref 39.0–52.0)
Hemoglobin: 14.4 g/dL (ref 13.0–17.0)
MCH: 34.4 pg — ABNORMAL HIGH (ref 26.0–34.0)
MCHC: 34.5 g/dL (ref 30.0–36.0)
MCV: 99.8 fL (ref 78.0–100.0)
PLATELETS: 70 10*3/uL — AB (ref 150–400)
RBC: 4.18 MIL/uL — AB (ref 4.22–5.81)
RDW: 13.8 % (ref 11.5–15.5)
WBC: 3 10*3/uL — AB (ref 4.0–10.5)

## 2016-06-27 LAB — LIPASE, BLOOD: Lipase: 23 U/L (ref 11–51)

## 2016-06-27 NOTE — ED Triage Notes (Signed)
Pt comes from [facility] by EMS with complaints of flu like symptoms and being "jittery" per patient. 126/84 BP, 100 HR, 99% O2, 16 RR.

## 2016-06-27 NOTE — ED Triage Notes (Addendum)
Pt states he has been dehydrated, emesis, shivering, cough, and states he "keeps going down".  When asking patient what that means he states it over again.

## 2016-06-28 ENCOUNTER — Emergency Department (HOSPITAL_COMMUNITY): Payer: Non-veteran care

## 2016-06-28 ENCOUNTER — Emergency Department (HOSPITAL_COMMUNITY)
Admission: EM | Admit: 2016-06-28 | Discharge: 2016-06-28 | Disposition: A | Discharge status: Home / Self Care | Payer: Non-veteran care | Attending: Emergency Medicine | Admitting: Emergency Medicine

## 2016-06-28 DIAGNOSIS — G8929 Other chronic pain: Secondary | ICD-10-CM

## 2016-06-28 DIAGNOSIS — R531 Weakness: Secondary | ICD-10-CM

## 2016-06-28 LAB — URINALYSIS, ROUTINE W REFLEX MICROSCOPIC
Bilirubin Urine: NEGATIVE
GLUCOSE, UA: NEGATIVE mg/dL
Hgb urine dipstick: NEGATIVE
Ketones, ur: NEGATIVE mg/dL
LEUKOCYTES UA: NEGATIVE
NITRITE: NEGATIVE
Protein, ur: NEGATIVE mg/dL
Specific Gravity, Urine: 1.009 (ref 1.005–1.030)
pH: 6 (ref 5.0–8.0)

## 2016-06-28 LAB — INFLUENZA PANEL BY PCR (TYPE A & B)
INFLAPCR: NEGATIVE
INFLBPCR: NEGATIVE

## 2016-06-28 MED ORDER — SODIUM CHLORIDE 0.9 % IV BOLUS (SEPSIS)
1000.0000 mL | Freq: Once | INTRAVENOUS | Status: AC
  Administered 2016-06-28: 1000 mL via INTRAVENOUS

## 2016-06-28 MED ORDER — LEVETIRACETAM 500 MG PO TABS
500.0000 mg | ORAL_TABLET | Freq: Once | ORAL | Status: AC
  Administered 2016-06-28: 500 mg via ORAL
  Filled 2016-06-28: qty 1

## 2016-06-28 MED ORDER — OXYCODONE-ACETAMINOPHEN 5-325 MG PO TABS
1.0000 | ORAL_TABLET | Freq: Once | ORAL | Status: AC
Start: 2016-06-28 — End: 2016-06-28
  Administered 2016-06-28: 1 via ORAL
  Filled 2016-06-28: qty 1

## 2016-06-28 NOTE — ED Notes (Signed)
Bed: WA04 Expected date:  Expected time:  Means of arrival:  Comments: 

## 2016-06-28 NOTE — ED Provider Notes (Signed)
[facility]-EMERGENCY DEPT Provider Note   CSN: [id number] Arrival date & time: 06/27/16  2020     History   Chief Complaint Chief Complaint  Patient presents with  . Multiple Complaints    HPI Dennis Zhang is a 61 y.o. male.  HPI   Pt with hx chronic pain, DM, HTN, HLD, seizures p/w multiple complaints.  States that he has chronic pain and has been out of his pain medication for 3 weeks since [place] closed his practice.  Was on Percocet 10s.  States he walks with a cane and has been "fighting to stand up."  States that he has "seizures" in which he falls to the ground shaking - states he remembers falling to the ground each time.  Has hit his head multiple times but denies any headache.  Has had 3 days of sore throat, cough at night.  Chronic pain is mostly in his back but also his abdomen and chest, this is unchanged.   States he is having a lot of pain and has had to use friends' pain medication to treat it.    Of note, pt is a poor historian.     Past Medical History:  Diagnosis Date  . Arthritis   . Chronic pain   . Chronic pain   . 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 Hyperlipidemia   . Hypertension   . Seizures [facility])     Patient Active Problem List   Diagnosis Date Noted  . Leukopenia 05/23/2016  . Lactic acidosis 05/23/2016  . Syncope and collapse 05/22/2016  . Syncope 06/15/2014  . Hypotension 06/15/2014  . Fall   . Arterial hypotension   . Weakness   . Dehydration 06/14/2014  . Protein-calorie malnutrition, severe (HCC) 05/21/2014  . Acute kidney injury (HCC) 05/20/2014  . Orthostatic hypotension 05/20/2014  . Alcohol abuse 05/20/2014  . Transaminitis 05/20/2014  . Thrombocytopenia (HCC) 05/20/2014  . Acute renal failure (HCC) 03/03/2014  . Alcohol intoxication (HCC) 03/03/2014  . Increased anion gap metabolic acidosis 03/03/2014  . Malnutrition of moderate degree (HCC) 03/03/2014  . Hypokalemia 10/06/2012  .  Gram-negative bacteremia 10/06/2012  . Leukocytosis 10/03/2012  . Chronic pancreatitis (HCC) 10/03/2012  . AKI (acute kidney injury) (HCC) 10/03/2012  . Alcoholic hepatitis 10/03/2012  . Hypoglycemia secondary to sulfonylurea 10/03/2012    Past Surgical History:  Procedure Laterality Date  . ABDOMINAL SURGERY    . APPENDECTOMY    . HEMORROIDECTOMY         Home Medications    Prior to Admission medications   Medication Sig Start Date End Date Taking? Authorizing Provider  acetaminophen (TYLENOL) 500 MG tablet Take 500 mg by mouth 3 (three) times daily as needed (for pain.).     Historical Provider, MD  feeding supplement, ENSURE COMPLETE, (ENSURE COMPLETE) LIQD Take 237 mLs by mouth 3 (three) times daily between meals. Patient not taking: Reported on 05/22/2016 06/16/14   Rhetta Mura, MD  folic acid (FOLVITE) 1 MG tablet Take 1 tablet (1 mg total) by mouth daily. 05/24/16   Albertine Grates, MD  gabapentin (NEURONTIN) 300 MG capsule Take 1 capsule (300 mg total) by mouth 3 (three) times daily. Patient taking differently: Take 300 mg by mouth 4 (four) times daily as needed (for nerve pain.).  07/31/12   Ruby Cola, PA-C  levETIRAcetam (KEPPRA) 500 MG tablet Take 1 tablet (500 mg total) by mouth 2 (two) times daily. 06/13/16   Elson Areas, PA-C  Magnesium  Oxide 420 MG TABS Take 420 mg by mouth daily.     Historical Provider, MD  Multiple Vitamin (MULTIVITAMIN WITH MINERALS) TABS tablet Take 1 tablet by mouth daily.    Historical Provider, MD  Naphazoline HCl (CLEAR EYES OP) Place 1 drop into both eyes daily.    Historical Provider, MD  naproxen (NAPROSYN) 375 MG tablet Take 1 tablet (375 mg total) by mouth 2 (two) times daily. Patient not taking: Reported on 05/22/2016 11/23/15   Gilda Creasehristopher J Pollina, MD  pantoprazole (PROTONIX) 40 MG tablet Take 1 tablet (40 mg total) by mouth daily. 05/24/16   Albertine GratesFang Xu, MD  sildenafil (VIAGRA) 100 MG tablet Take 100 mg by mouth daily as needed for  erectile dysfunction.    Historical Provider, MD  tamsulosin (FLOMAX) 0.4 MG CAPS capsule Take 1 capsule (0.4 mg total) by mouth daily after breakfast. 05/23/16   Albertine GratesFang Xu, MD  thiamine 100 MG tablet Take 1 tablet (100 mg total) by mouth daily. 05/24/16   Albertine GratesFang Xu, MD  traMADol (ULTRAM) 50 MG tablet Take 1 tablet (50 mg total) by mouth every 6 (six) hours as needed for moderate pain. 05/23/16   Albertine GratesFang Xu, MD    Family History Family History  Problem Relation Age of Onset  . Hypertension Mother   . Migraines Sister   . Heart failure Brother   . Migraines Brother     Social History Social History  Substance Use Topics  . Smoking status: Current Every Day Smoker    Packs/day: 0.50    Years: 30.00    Types: Cigarettes  . Smokeless tobacco: Never Used  . Alcohol use Yes     Comment: occ     Allergies   Ace inhibitors; Aspirin; and Tylenol [acetaminophen]   Review of Systems Review of Systems  All other systems reviewed and are negative.    Physical Exam Updated Vital Signs BP 134/80   Pulse 93   Temp 98.5 F (36.9 C) (Oral)   Resp 20   Ht 6' 2.5" (1.892 m)   Wt 71.2 kg   SpO2 97%   BMI 19.89 kg/m   Physical Exam  Constitutional: He appears well-nourished. No distress.  Thin, frail  HENT:  Head: Normocephalic and atraumatic.  Neck: Neck supple.  Cardiovascular: Normal rate and regular rhythm.   Pulmonary/Chest: Effort normal and breath sounds normal. No respiratory distress. He has no wheezes. He has no rales.  Abdominal: Soft. He exhibits no distension and no mass. There is tenderness (mild, diffuse). There is no rebound and no guarding.  Neurological: He is alert. He exhibits normal muscle tone.  Skin: He is not diaphoretic.  Nursing note and vitals reviewed.    ED Treatments / Results  Labs (all labs ordered are listed, but only abnormal results are displayed) Labs Reviewed  COMPREHENSIVE METABOLIC PANEL - Abnormal; Notable for the following:       Result  Value   Potassium 3.4 (*)    Chloride 97 (*)    Glucose, Bld 114 (*)    Creatinine, Ser 1.29 (*)    Total Protein 9.2 (*)    AST 137 (*)    Alkaline Phosphatase 141 (*)    Total Bilirubin 1.5 (*)    GFR calc non Af Amer 58 (*)    All other components within normal limits  CBC - Abnormal; Notable for the following:    WBC 3.0 (*)    RBC 4.18 (*)    MCH 34.4 (*)  Platelets 70 (*)    All other components within normal limits  LIPASE, BLOOD  URINALYSIS, ROUTINE W REFLEX MICROSCOPIC  INFLUENZA PANEL BY PCR (TYPE A & B)    EKG  EKG Interpretation  Date/Time:  Wednesday June 28 2016 03:28:50 EST Ventricular Rate:  86 PR Interval:    QRS Duration: 96 QT Interval:  416 QTC Calculation: 498 R Axis:   -92 Text Interpretation:  Sinus rhythm Biatrial enlargement Low voltage, extremity leads Consider inferior infarct Probable anteroseptal infarct, old No significant change since last tracing Confirmed by Bebe Shaggy  MD, DONALD ([id number]) on 06/28/2016 3:32:48 AM       Radiology Dg Chest 2 View  Result Date: 06/28/2016 CLINICAL DATA:  61 y/o  M; cough and body aches. EXAM: CHEST  2 VIEW COMPARISON:  04/30/2015 chest radiograph FINDINGS: Stable heart size and mediastinal contours are within normal limits. Both lungs are clear. No pleural effusion. Mild degenerative changes of the thoracic spine. IMPRESSION: No active cardiopulmonary disease. Electronically Signed   By: Mitzi Hansen M.D.   On: 06/28/2016 04:57    Procedures Procedures (including critical care time)  Medications Ordered in ED Medications  sodium chloride 0.9 % bolus 1,000 mL (0 mLs Intravenous Stopped 06/28/16 0530)  levETIRAcetam (KEPPRA) tablet 500 mg (500 mg Oral Given 06/28/16 0514)  oxyCODONE-acetaminophen (PERCOCET/ROXICET) 5-325 MG per tablet 1 tablet (1 tablet Oral Given 06/28/16 0617)     Initial Impression / Assessment and Plan / ED Course  I have reviewed the triage vital signs and the nursing  notes.  Pertinent labs & imaging results that were available during my care of the patient were reviewed by me and considered in my medical decision making (see chart for details).  Clinical Course as of Jun 29 723  Wed Jun 28, 2016  0556 Pt ambulated in department without difficulty.  Pt tells me he is hungry and thinks all of his problems are coming from needing his pain medication.  [facility] Dr Mayford Knife has closed his office here.    [EW]    Clinical Course User Index [EW] Trixie Dredge, PA-C    Afebrile, nontoxic patient with hx seizures, chronic pain p/w uncontrolled chronic pain, recent URI symptoms, generalized weakness.  He reports falling several times and describes shaking.  He does not lose consciousness during these events.  Last discharge summary notes pt was supposed to be on Keppra but has not taken it for over a year.  Pt was able to ambulate easily in the ED.  Pt reports hitting his head, last was 3 days ago and he denies any pain.  Workup reassuring.  Suspect patient is having uncontrolled chronic pain though per [facility] database he has not been prescribed narcotics since September.  IVF, po pain medication given.  D/C home with close PCP follow up.  Discussed result, findings, treatment, and follow up  with patient.  Pt given return precautions.  Pt verbalizes understanding and agrees with plan.       Final Clinical Impressions(s) / ED Diagnoses   Final diagnoses:  Other chronic pain  Generalized weakness    New Prescriptions Discharge Medication List as of 06/28/2016  6:00 AM       Trixie Dredge, PA-C 06/28/16 0725    Zadie Rhine, MD 06/28/16 2356

## 2016-06-28 NOTE — ED Notes (Signed)
Ambulated pt in hall, BP was 163/126 (139), Pulse was 101, Oxygen was 99, Heart rate was 119 and Respirations were 18. Pt stated while being ambulated that he "felt fine".

## 2016-06-28 NOTE — Discharge Instructions (Signed)
Read the information below.  You may return to the Emergency Department at any time for worsening condition or any new symptoms that concern you. °

## 2016-06-28 NOTE — ED Notes (Signed)
Pt is aware that urine sample is needed. 

## 2016-07-05 ENCOUNTER — Encounter (HOSPITAL_COMMUNITY): Payer: Self-pay | Admitting: Emergency Medicine

## 2016-07-05 ENCOUNTER — Emergency Department (HOSPITAL_COMMUNITY)
Admission: EM | Admit: 2016-07-05 | Discharge: 2016-07-06 | Disposition: A | Discharge status: Home / Self Care | Payer: Non-veteran care | Attending: Emergency Medicine | Admitting: Emergency Medicine

## 2016-07-05 DIAGNOSIS — F10120 Alcohol abuse with intoxication, uncomplicated: Secondary | ICD-10-CM | POA: Diagnosis present

## 2016-07-05 DIAGNOSIS — R251 Tremor, unspecified: Secondary | ICD-10-CM | POA: Insufficient documentation

## 2016-07-05 DIAGNOSIS — M791 Myalgia: Secondary | ICD-10-CM | POA: Diagnosis not present

## 2016-07-05 DIAGNOSIS — W19XXXA Unspecified fall, initial encounter: Secondary | ICD-10-CM | POA: Insufficient documentation

## 2016-07-05 DIAGNOSIS — M7918 Myalgia, other site: Secondary | ICD-10-CM

## 2016-07-05 DIAGNOSIS — E119 Type 2 diabetes mellitus without complications: Secondary | ICD-10-CM | POA: Insufficient documentation

## 2016-07-05 DIAGNOSIS — I10 Essential (primary) hypertension: Secondary | ICD-10-CM | POA: Insufficient documentation

## 2016-07-05 DIAGNOSIS — Y939 Activity, unspecified: Secondary | ICD-10-CM | POA: Insufficient documentation

## 2016-07-05 DIAGNOSIS — Y929 Unspecified place or not applicable: Secondary | ICD-10-CM | POA: Diagnosis not present

## 2016-07-05 DIAGNOSIS — F17210 Nicotine dependence, cigarettes, uncomplicated: Secondary | ICD-10-CM | POA: Diagnosis not present

## 2016-07-05 DIAGNOSIS — Y999 Unspecified external cause status: Secondary | ICD-10-CM | POA: Insufficient documentation

## 2016-07-05 DIAGNOSIS — F10920 Alcohol use, unspecified with intoxication, uncomplicated: Secondary | ICD-10-CM

## 2016-07-05 MED ORDER — THIAMINE HCL 100 MG/ML IJ SOLN
Freq: Once | INTRAVENOUS | Status: AC
  Administered 2016-07-06: via INTRAVENOUS
  Filled 2016-07-05: qty 1000

## 2016-07-05 NOTE — ED Triage Notes (Signed)
Pt brought in by EMS for c/o arm tremors and has not been eating  Pt smells of ETOH and states he has not been on his meds for the past two weeks

## 2016-07-05 NOTE — ED Provider Notes (Signed)
[facility]-EMERGENCY DEPT Provider Note   CSN: [id number] Arrival date & time: 07/05/16  4782   By signing my name below, I, Nelwyn Salisbury, attest that this documentation has been prepared under the direction and in the presence of non-physician practitioner, Melvenia Beam A Tyrhonda Georgiades PA-C. Electronically Signed: Nelwyn Salisbury, Scribe. 07/05/2016. 11:41 PM.   History   Chief Complaint Chief Complaint  Patient presents with  . Tremors  . Alcohol Intoxication   HPI  HPI Comments:  Dennis Zhang is a 61 y.o. male with pmhx of HTN, Seizures and DM who presents to the Emergency Department complaining of sudden-onset, constant left sided back pain s/p fall occurring earlier today. Pt reports associated left shoulder pain, left hip pain, left leg pain and also notes that he has had seizures today. He denies recent alcohol use.      Past Medical History:  Diagnosis Date  . Arthritis   . Chronic pain   . Chronic pain   . 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 Hyperlipidemia   . Hypertension   . Seizures [facility])     Patient Active Problem List   Diagnosis Date Noted  . Leukopenia 05/23/2016  . Lactic acidosis 05/23/2016  . Syncope and collapse 05/22/2016  . Syncope 06/15/2014  . Hypotension 06/15/2014  . Fall   . Arterial hypotension   . Weakness   . Dehydration 06/14/2014  . Protein-calorie malnutrition, severe (HCC) 05/21/2014  . Acute kidney injury (HCC) 05/20/2014  . Orthostatic hypotension 05/20/2014  . Alcohol abuse 05/20/2014  . Transaminitis 05/20/2014  . Thrombocytopenia (HCC) 05/20/2014  . Acute renal failure (HCC) 03/03/2014  . Alcohol intoxication (HCC) 03/03/2014  . Increased anion gap metabolic acidosis 03/03/2014  . Malnutrition of moderate degree (HCC) 03/03/2014  . Hypokalemia 10/06/2012  . Gram-negative bacteremia 10/06/2012  . Leukocytosis 10/03/2012  . Chronic pancreatitis (HCC) 10/03/2012  . AKI (acute kidney injury) (HCC) 10/03/2012  .  Alcoholic hepatitis 10/03/2012  . Hypoglycemia secondary to sulfonylurea 10/03/2012    Past Surgical History:  Procedure Laterality Date  . ABDOMINAL SURGERY    . APPENDECTOMY    . HEMORROIDECTOMY         Home Medications    Prior to Admission medications   Medication Sig Start Date End Date Taking? Authorizing Provider  acetaminophen (TYLENOL) 500 MG tablet Take 500 mg by mouth 3 (three) times daily as needed (for pain.).     Historical Provider, MD  feeding supplement, ENSURE COMPLETE, (ENSURE COMPLETE) LIQD Take 237 mLs by mouth 3 (three) times daily between meals. Patient not taking: Reported on 05/22/2016 06/16/14   Rhetta Mura, MD  folic acid (FOLVITE) 1 MG tablet Take 1 tablet (1 mg total) by mouth daily. 05/24/16   Albertine Grates, MD  gabapentin (NEURONTIN) 300 MG capsule Take 1 capsule (300 mg total) by mouth 3 (three) times daily. Patient taking differently: Take 300 mg by mouth 4 (four) times daily as needed (for nerve pain.).  07/31/12   Ruby Cola, PA-C  levETIRAcetam (KEPPRA) 500 MG tablet Take 1 tablet (500 mg total) by mouth 2 (two) times daily. 06/13/16   Elson Areas, PA-C  Magnesium Oxide 420 MG TABS Take 420 mg by mouth daily.     Historical Provider, MD  Multiple Vitamin (MULTIVITAMIN WITH MINERALS) TABS tablet Take 1 tablet by mouth daily.    Historical Provider, MD  Naphazoline HCl (CLEAR EYES OP) Place 1 drop into both eyes daily.    Historical Provider, MD  naproxen (NAPROSYN) 375 MG tablet Take 1 tablet (375 mg total) by mouth 2 (two) times daily. Patient not taking: Reported on 05/22/2016 11/23/15   Gilda Crease, MD  pantoprazole (PROTONIX) 40 MG tablet Take 1 tablet (40 mg total) by mouth daily. 05/24/16   Albertine Grates, MD  sildenafil (VIAGRA) 100 MG tablet Take 100 mg by mouth daily as needed for erectile dysfunction.    Historical Provider, MD  tamsulosin (FLOMAX) 0.4 MG CAPS capsule Take 1 capsule (0.4 mg total) by mouth daily after breakfast. 05/23/16    Albertine Grates, MD  thiamine 100 MG tablet Take 1 tablet (100 mg total) by mouth daily. 05/24/16   Albertine Grates, MD  traMADol (ULTRAM) 50 MG tablet Take 1 tablet (50 mg total) by mouth every 6 (six) hours as needed for moderate pain. 05/23/16   Albertine Grates, MD    Family History Family History  Problem Relation Age of Onset  . Hypertension Mother   . Migraines Sister   . Heart failure Brother   . Migraines Brother     Social History Social History  Substance Use Topics  . Smoking status: Current Every Day Smoker    Packs/day: 0.50    Years: 30.00    Types: Cigarettes  . Smokeless tobacco: Never Used  . Alcohol use Yes     Comment: occ     Allergies   Ace inhibitors; Aspirin; and Tylenol [acetaminophen]   Review of Systems Review of Systems  Constitutional: Negative for fever.  Respiratory: Negative for shortness of breath.   Cardiovascular: Negative for chest pain.  Gastrointestinal: Negative for abdominal pain, nausea and vomiting.  Musculoskeletal:       See HPI.  Skin: Negative for wound.  Neurological: Positive for tremors and seizures.     Physical Exam Updated Vital Signs BP 108/66 (BP Location: Right Arm)   Pulse 95   Temp 97.9 F (36.6 C) (Oral)   Resp 18   SpO2 99%   Physical Exam  Constitutional: He is oriented to person, place, and time. He appears well-developed and well-nourished. No distress.  HENT:  Head: Atraumatic.  Eyes: Conjunctivae are normal.  Neck: Normal range of motion. Neck supple.  Cardiovascular: Normal rate.   No murmur heard. Pulmonary/Chest: Effort normal. He has no wheezes. He has no rales.  Abdominal: Soft. He exhibits no distension. There is no tenderness.  Musculoskeletal: Normal range of motion.  Neurological: He is alert and oriented to person, place, and time.  Skin: Skin is warm and dry.     ED Treatments / Results  DIAGNOSTIC STUDIES:  Oxygen Saturation is 99% on RA, normal by my interpretation.    COORDINATION OF  CARE:  11:42 PM Discussed treatment plan with pt at bedside and pt agreed to plan.  Labs (all labs ordered are listed, but only abnormal results are displayed) Labs Reviewed - No data to display  EKG  EKG Interpretation None       Radiology No results found.  Procedures Procedures (including critical care time)  Medications Ordered in ED Medications - No data to display   Initial Impression / Assessment and Plan / ED Course  I have reviewed the triage vital signs and the nursing notes.  Pertinent labs & imaging results that were available during my care of the patient were reviewed by me and considered in my medical decision making (see chart for details).     Patient with history of alcohol dependence, seizures on Keppra presents with  concern for tremors today and fall causing musculoskeletal pain. No fever, vomiting, bleeding, SOB, cough.  The patient has full function of all joints/extremities without restriction and is able to sit up, stand and change position. Doubt fracture from fall yesterday. He is eating and drinking. He has asked repeatedly for pain medication and was offered Toradol which he declined.   He is having a spastic tremor to upper and lower extremities, at rest and when awake and talking. Patient's alcohol is high at 303. Doubt this is related to alcohol withdrawal. When asked if these tremors are what he meant by "seizures" today, he said yes. He is neurologically intact, alert and oriented. He has been given 1 liter banana bag for rehydration and supplementation. He is felt stable for discharge home with PCP follow up for recheck later today or tomorrow.   Final Clinical Impressions(s) / ED Diagnoses   Final diagnoses:  None   1. Alcohol intoxication 2. Musculoskeletal pain 3. Tremor  New Prescriptions New Prescriptions   No medications on file  I personally performed the services described in this documentation, which was scribed in my  presence. The recorded information has been reviewed and is accurate.      Elpidio AnisShari Mauriana Dann, PA-C 07/06/16 0228    Elpidio AnisShari Carron Mcmurry, PA-C 07/06/16 0230    Shon Batonourtney F Horton, MD 07/06/16 [phone]

## 2016-07-06 LAB — I-STAT CHEM 8, ED
BUN: 7 mg/dL (ref 6–20)
CHLORIDE: 104 mmol/L (ref 101–111)
Calcium, Ion: 0.88 mmol/L — CL (ref 1.15–1.40)
Creatinine, Ser: 1.6 mg/dL — ABNORMAL HIGH (ref 0.61–1.24)
GLUCOSE: 136 mg/dL — AB (ref 65–99)
HCT: 39 % (ref 39.0–52.0)
HEMOGLOBIN: 13.3 g/dL (ref 13.0–17.0)
Potassium: 3.2 mmol/L — ABNORMAL LOW (ref 3.5–5.1)
SODIUM: 144 mmol/L (ref 135–145)
TCO2: 17 mmol/L (ref 0–100)

## 2016-07-06 LAB — ETHANOL: ALCOHOL ETHYL (B): 303 mg/dL — AB (ref <5)

## 2016-07-06 MED ORDER — KETOROLAC TROMETHAMINE 15 MG/ML IJ SOLN
15.0000 mg | Freq: Once | INTRAMUSCULAR | Status: AC
  Administered 2016-07-06: 15 mg via INTRAVENOUS
  Filled 2016-07-06 (×2): qty 1

## 2016-07-26 ENCOUNTER — Encounter (HOSPITAL_COMMUNITY): Payer: Self-pay | Admitting: Emergency Medicine

## 2016-07-26 ENCOUNTER — Emergency Department (HOSPITAL_COMMUNITY)
Admission: EM | Admit: 2016-07-26 | Discharge: 2016-07-26 | Disposition: A | Discharge status: Home / Self Care | Payer: Medicaid Other | Attending: Emergency Medicine | Admitting: Emergency Medicine

## 2016-07-26 DIAGNOSIS — I10 Essential (primary) hypertension: Secondary | ICD-10-CM | POA: Diagnosis not present

## 2016-07-26 DIAGNOSIS — Z5900 Homelessness unspecified: Secondary | ICD-10-CM

## 2016-07-26 DIAGNOSIS — Y929 Unspecified place or not applicable: Secondary | ICD-10-CM | POA: Diagnosis not present

## 2016-07-26 DIAGNOSIS — G8929 Other chronic pain: Secondary | ICD-10-CM | POA: Diagnosis not present

## 2016-07-26 DIAGNOSIS — W1830XA Fall on same level, unspecified, initial encounter: Secondary | ICD-10-CM | POA: Insufficient documentation

## 2016-07-26 DIAGNOSIS — Z79899 Other long term (current) drug therapy: Secondary | ICD-10-CM | POA: Insufficient documentation

## 2016-07-26 DIAGNOSIS — F17210 Nicotine dependence, cigarettes, uncomplicated: Secondary | ICD-10-CM | POA: Diagnosis not present

## 2016-07-26 DIAGNOSIS — Y939 Activity, unspecified: Secondary | ICD-10-CM | POA: Diagnosis not present

## 2016-07-26 DIAGNOSIS — M549 Dorsalgia, unspecified: Secondary | ICD-10-CM | POA: Insufficient documentation

## 2016-07-26 DIAGNOSIS — Y999 Unspecified external cause status: Secondary | ICD-10-CM | POA: Diagnosis not present

## 2016-07-26 DIAGNOSIS — T730XXA Starvation, initial encounter: Secondary | ICD-10-CM | POA: Diagnosis not present

## 2016-07-26 LAB — I-STAT CHEM 8, ED
BUN: 10 mg/dL (ref 6–20)
CALCIUM ION: 1.02 mmol/L — AB (ref 1.15–1.40)
CHLORIDE: 99 mmol/L — AB (ref 101–111)
Creatinine, Ser: 1.4 mg/dL — ABNORMAL HIGH (ref 0.61–1.24)
Glucose, Bld: 98 mg/dL (ref 65–99)
HEMATOCRIT: 42 % (ref 39.0–52.0)
Hemoglobin: 14.3 g/dL (ref 13.0–17.0)
Potassium: 3.7 mmol/L (ref 3.5–5.1)
SODIUM: 144 mmol/L (ref 135–145)
TCO2: 34 mmol/L (ref 0–100)

## 2016-07-26 MED ORDER — IBUPROFEN 200 MG PO TABS
600.0000 mg | ORAL_TABLET | Freq: Once | ORAL | Status: DC
  Filled 2016-07-26: qty 3

## 2016-07-26 MED ORDER — NAPROXEN 375 MG PO TABS: 375.0000 mg | ORAL_TABLET | Freq: Two times a day (BID) | ORAL | 0 refills | Status: DC

## 2016-07-26 NOTE — ED Notes (Signed)
Pt refused vitals and e-signature for discharge papers and stated he received no service today at this hospital.  Pt was offered medication for pain, a sandwich, a drink, and a blanket.

## 2016-07-26 NOTE — ED Triage Notes (Addendum)
Per EMS pt from [facility], pt homeless. Pt ETOH on board. Pt c/o back pain, this is chronic. Pt states he needs pain medication for back , and is experiencing dehydration. NAD noted. Pt verbally agressive to EMS.

## 2016-07-26 NOTE — Discharge Instructions (Signed)
Read the information below.  Use the prescribed medication as directed.  Please discuss all new medications with your pharmacist.  You may return to the Emergency Department at any time for worsening condition or any new symptoms that concern you.    If you develop fevers, loss of control of bowel or bladder, weakness or numbness in your legs, or are unable to walk, return to the ER for a recheck.  °

## 2016-07-26 NOTE — ED Provider Notes (Signed)
[facility]-EMERGENCY DEPT Provider Note   CSN: [id number] Arrival date & time: 07/26/16  1511  By signing my name below, I, Marnette Burgessyan Andrew Long, attest that this documentation has been prepared under the direction and in the presence of Darrold Bezek B. ChadWest, PA-C. Electronically Signed: Marnette Burgessyan Andrew Long, Scribe. 07/26/2016. 3:50 PM.  History   Chief Complaint Chief Complaint  Patient presents with  . Back Pain   The history is provided by the patient. No language interpreter was used.    HPI Comments:  Dennis Zhang is a 61 y.o. male with PMHx of HTN, Seizures, Chronic Pain, Arthritis, HLD, and DM, who presents to the Emergency Department by way of ambulance complaining of constant, left-sided back pain s/p three weeks ago. Per EMS, pt is homeless and presents to the ED inebriated. Pt states his chronic pain has flared up s/p a mechanical fall this morning at ground level and is experiencing dehydration. Denies LOC. He ambulates with a cane. He states he ran out of blood pressure medication this morning and reports he has not eaten since yesterday because he spent his last remaining money on staying in a hotel last night. Drank a 40 oz beer this morning.  Per pt, he has a doctor's appointment upcoming on 07/28/16. Pt has an associated symptom of chronic hip pain. He states ambulation and exertion exacerbates his pain. He has not tried anything for relief of his symptoms PTA. Pt denies CP, abdominal pian, vomiting, diarrhea, urinary symptoms, and any other complaints at this time.  Denies any new injury to his back.    Past Medical History:  Diagnosis Date  . Arthritis   . Chronic pain   . Chronic pain   . 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 Hyperlipidemia   . Hypertension   . Seizures [facility](HCC)     Patient Active Problem List   Diagnosis Date Noted  . Leukopenia 05/23/2016  . Lactic acidosis 05/23/2016  . Syncope and collapse 05/22/2016  . Syncope 06/15/2014  . Hypotension  06/15/2014  . Fall   . Arterial hypotension   . Weakness   . Dehydration 06/14/2014  . Protein-calorie malnutrition, severe (HCC) 05/21/2014  . Acute kidney injury (HCC) 05/20/2014  . Orthostatic hypotension 05/20/2014  . Alcohol abuse 05/20/2014  . Transaminitis 05/20/2014  . Thrombocytopenia (HCC) 05/20/2014  . Acute renal failure (HCC) 03/03/2014  . Alcohol intoxication (HCC) 03/03/2014  . Increased anion gap metabolic acidosis 03/03/2014  . Malnutrition of moderate degree (HCC) 03/03/2014  . Hypokalemia 10/06/2012  . Gram-negative bacteremia 10/06/2012  . Leukocytosis 10/03/2012  . Chronic pancreatitis (HCC) 10/03/2012  . AKI (acute kidney injury) (HCC) 10/03/2012  . Alcoholic hepatitis 10/03/2012  . Hypoglycemia secondary to sulfonylurea 10/03/2012    Past Surgical History:  Procedure Laterality Date  . ABDOMINAL SURGERY    . APPENDECTOMY    . HEMORROIDECTOMY         Home Medications    Prior to Admission medications   Medication Sig Start Date End Date Taking? Authorizing Provider  acetaminophen (TYLENOL) 500 MG tablet Take 500 mg by mouth 3 (three) times daily as needed (for pain.).     Historical Provider, MD  feeding supplement, ENSURE COMPLETE, (ENSURE COMPLETE) LIQD Take 237 mLs by mouth 3 (three) times daily between meals. Patient not taking: Reported on 05/22/2016 06/16/14   Rhetta MuraJai-Gurmukh Samtani, MD  folic acid (FOLVITE) 1 MG tablet Take 1 tablet (1 mg total) by mouth daily. 05/24/16   Albertine GratesFang Xu,  MD  gabapentin (NEURONTIN) 300 MG capsule Take 1 capsule (300 mg total) by mouth 3 (three) times daily. Patient taking differently: Take 300 mg by mouth 4 (four) times daily as needed (for nerve pain.).  07/31/12   Ruby Cola, PA-C  levETIRAcetam (KEPPRA) 500 MG tablet Take 1 tablet (500 mg total) by mouth 2 (two) times daily. 06/13/16   Elson Areas, PA-C  Magnesium Oxide 420 MG TABS Take 420 mg by mouth daily.     Historical Provider, MD  Multiple Vitamin  (MULTIVITAMIN WITH MINERALS) TABS tablet Take 1 tablet by mouth daily.    Historical Provider, MD  Naphazoline HCl (CLEAR EYES OP) Place 1 drop into both eyes daily.    Historical Provider, MD  naproxen (NAPROSYN) 375 MG tablet Take 1 tablet (375 mg total) by mouth 2 (two) times daily. 07/26/16   Trixie Dredge, PA-C  pantoprazole (PROTONIX) 40 MG tablet Take 1 tablet (40 mg total) by mouth daily. 05/24/16   Albertine Grates, MD  sildenafil (VIAGRA) 100 MG tablet Take 100 mg by mouth daily as needed for erectile dysfunction.    Historical Provider, MD  tamsulosin (FLOMAX) 0.4 MG CAPS capsule Take 1 capsule (0.4 mg total) by mouth daily after breakfast. 05/23/16   Albertine Grates, MD  thiamine 100 MG tablet Take 1 tablet (100 mg total) by mouth daily. 05/24/16   Albertine Grates, MD  traMADol (ULTRAM) 50 MG tablet Take 1 tablet (50 mg total) by mouth every 6 (six) hours as needed for moderate pain. 05/23/16   Albertine Grates, MD    Family History Family History  Problem Relation Age of Onset  . Hypertension Mother   . Migraines Sister   . Heart failure Brother   . Migraines Brother     Social History Social History  Substance Use Topics  . Smoking status: Current Every Day Smoker    Packs/day: 0.50    Years: 30.00    Types: Cigarettes  . Smokeless tobacco: Never Used  . Alcohol use Yes     Comment: occ     Allergies   Ace inhibitors; Aspirin; and Tylenol [acetaminophen]   Review of Systems Review of Systems  Constitutional: Negative for fever.       Dehydrated. Has not eaten since yesterday.  Cardiovascular: Negative for chest pain.  Gastrointestinal: Negative for abdominal pain, diarrhea and vomiting.  Genitourinary: Negative for dysuria.  Musculoskeletal: Positive for arthralgias and back pain. Negative for neck pain.  Skin: Negative for color change and wound.  Psychiatric/Behavioral: Negative for self-injury.     Physical Exam Updated Vital Signs BP 108/77 (BP Location: Left Arm)   Pulse 83   Temp 98.1 F  (36.7 C) (Oral)   Resp 18   Ht 6\' 2"  (1.88 m)   Wt 71.2 kg   SpO2 95%   BMI 20.16 kg/m   Physical Exam  Constitutional: He appears well-developed and well-nourished. No distress.  HENT:  Head: Normocephalic and atraumatic.  Neck: Neck supple.  Pulmonary/Chest: Effort normal.  Abdominal: Soft. He exhibits no distension. There is no tenderness. There is no rebound and no guarding.  Musculoskeletal:  Spine nontender, no crepitus, or stepoffs. Lower extremities:  Strength 5/5, sensation intact, distal pulses intact. Ambulates normally with cane.   Neurological: He is alert.  Skin: He is not diaphoretic.  Nursing note and vitals reviewed.    ED Treatments / Results  DIAGNOSTIC STUDIES:  Oxygen Saturation is 99% on RA, normal by my interpretation.  COORDINATION OF CARE:  3:47 PM Discussed treatment plan with pt at bedside including food and pain medication and pt agreed to plan.  Labs (all labs ordered are listed, but only abnormal results are displayed) Labs Reviewed  I-STAT CHEM 8, ED - Abnormal; Notable for the following:       Result Value   Chloride 99 (*)    Creatinine, Ser 1.40 (*)    Calcium, Ion 1.02 (*)    All other components within normal limits    EKG  EKG Interpretation None       Radiology No results found.  Procedures Procedures (including critical care time)  Medications Ordered in ED Medications  ibuprofen (ADVIL,MOTRIN) tablet 600 mg (600 mg Oral Refused 07/26/16 1625)     Initial Impression / Assessment and Plan / ED Course  I have reviewed the triage vital signs and the nursing notes.  Pertinent labs & imaging results that were available during my care of the patient were reviewed by me and considered in my medical decision making (see chart for details).     Pt with chronic back pain uncontrolled now that his PCP has close his practice.  Pt has not been on narcotics for several months.  Has an upcoming PCP appointment in 2 days.   No change in his chronic pain. Pt is able to ambulate normally and there are no red flags for back pain.  Pt reports his blood sugar felt low.  He was given a sandwich in the ED.  Chem 8 remarkable only for continued but improved renal insufficiency.  Does not appear dehydrated.  D/C with resources and PCP follow up.  Given return precautions.    Final Clinical Impressions(s) / ED Diagnoses   Final diagnoses:  Chronic back pain, unspecified back location, unspecified back pain laterality  Hungry, initial encounter  Homelessness    New Prescriptions Discharge Medication List as of 07/26/2016  5:31 PM      I personally performed the services described in this documentation, which was scribed in my presence. The recorded information has been reviewed and is accurate.    Trixie Dredge, PA-C 07/26/16 1801    Gerhard Munch, MD 07/27/16 2136

## 2016-07-27 IMAGING — US US RENAL
1 series · 14 of 22 positions shown · non-contrast
Comparison: None.

CLINICAL DATA: Acute renal failure

EXAM:
RENAL/URINARY TRACT ULTRASOUND COMPLETE

[Series 1: us renal · 0.22mm/px · 14 of 22 slices shown]
[im 1/22]
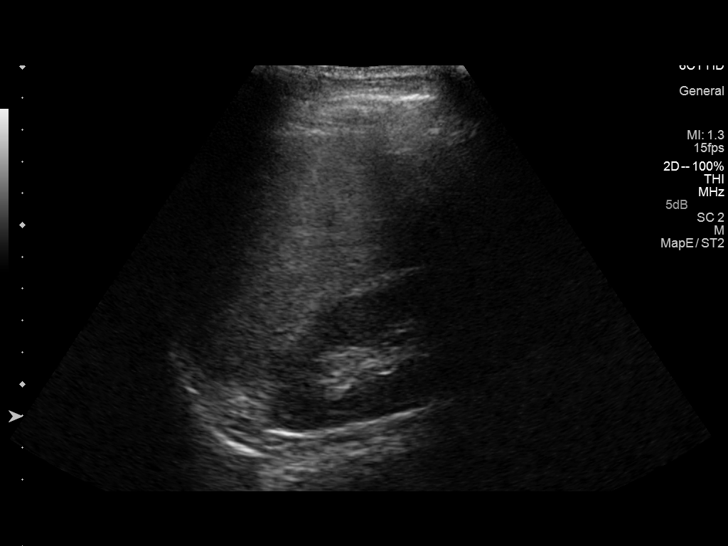
[im 3/22]
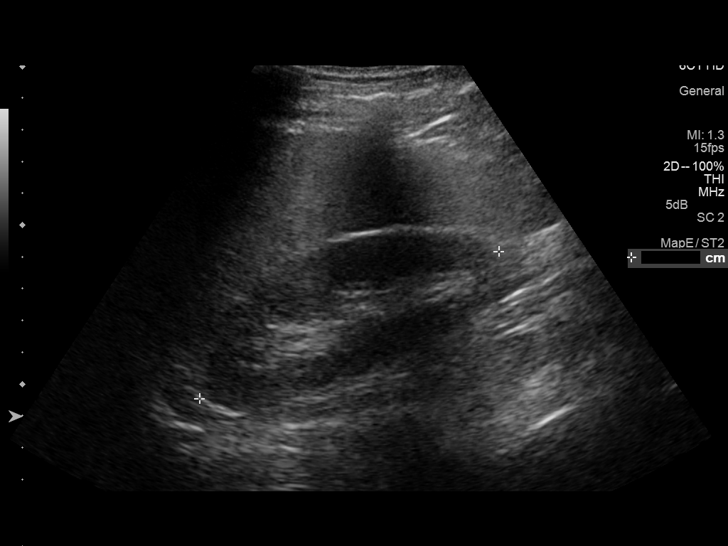
[im 4/22]
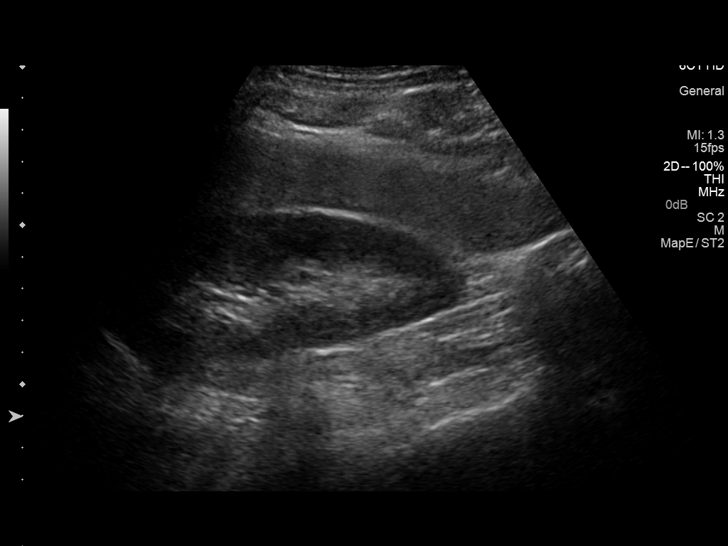
[im 6/22]
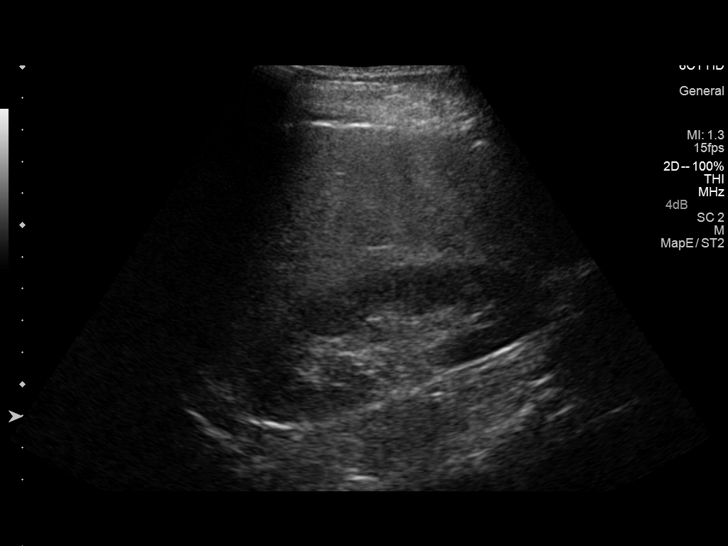
[im 8/22]
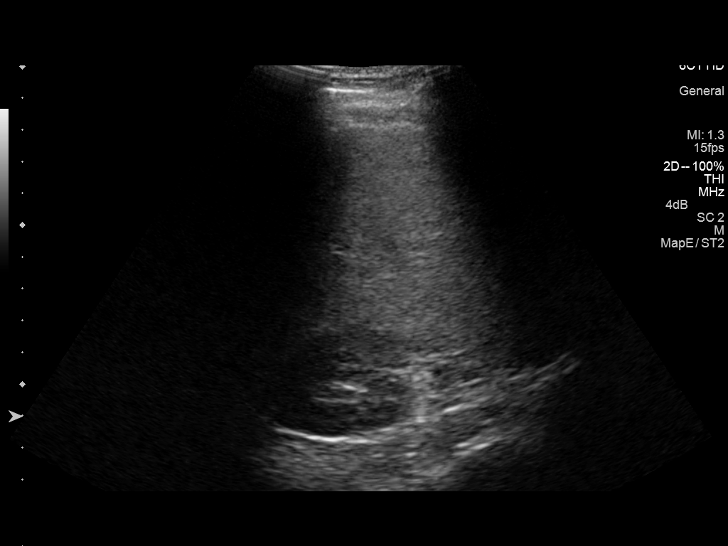
[im 9/22]
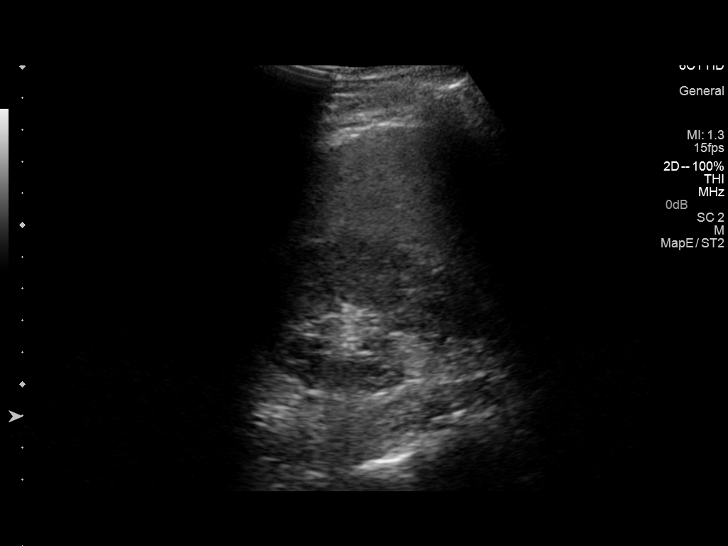
[im 11/22]
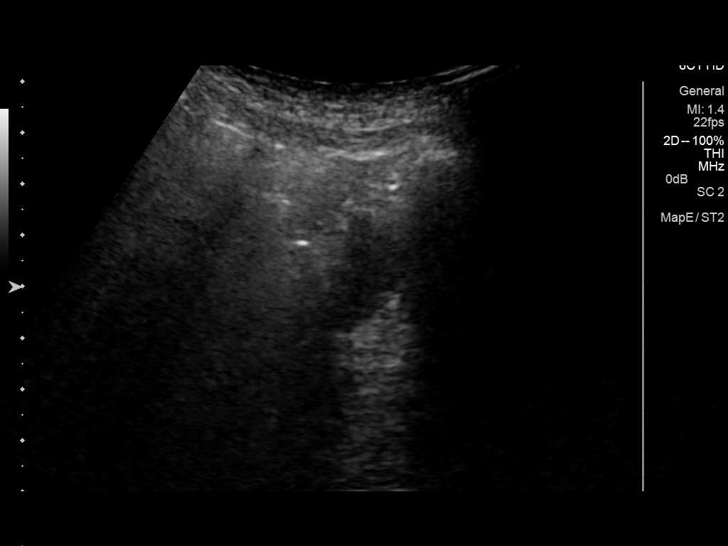
[im 12/22]
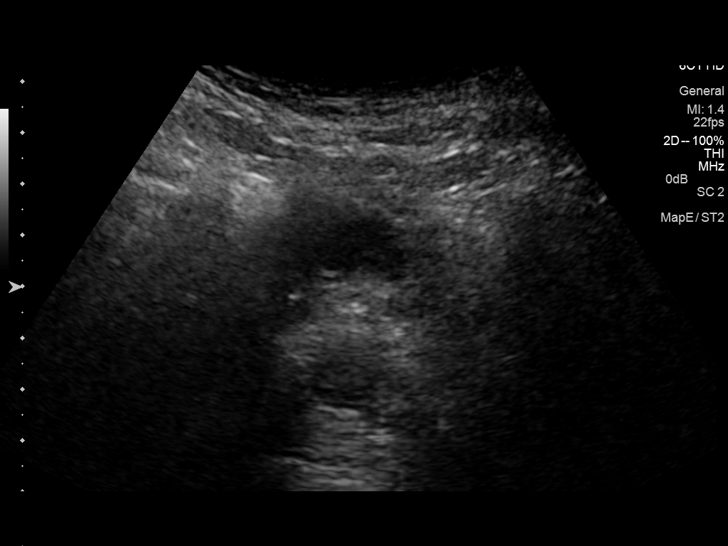
[im 14/22]
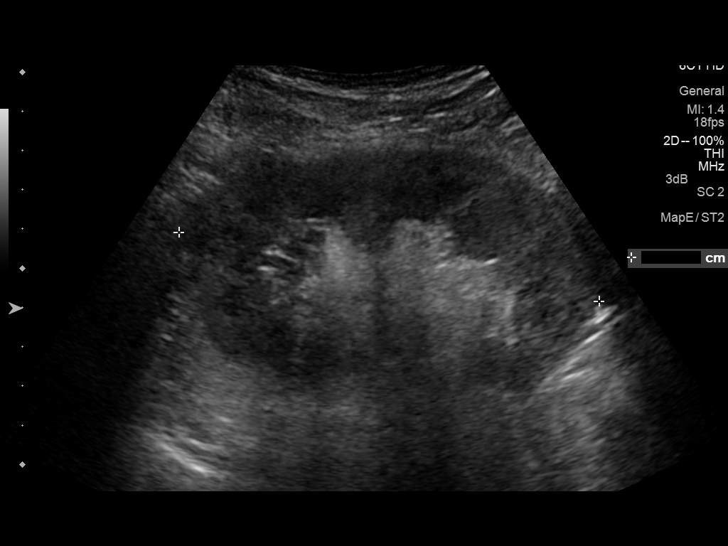
[im 15/22]
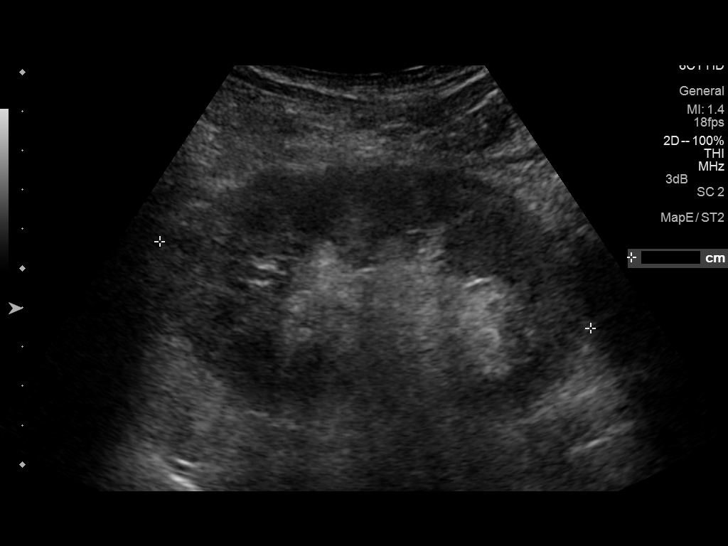
[im 17/22]
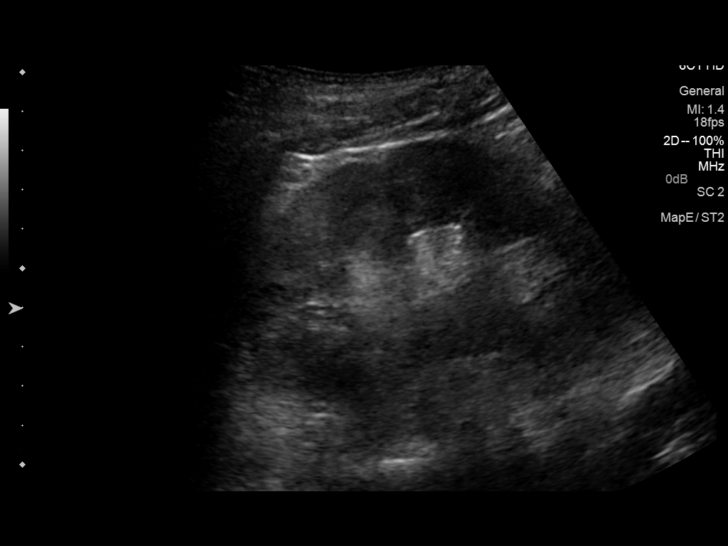
[im 19/22]
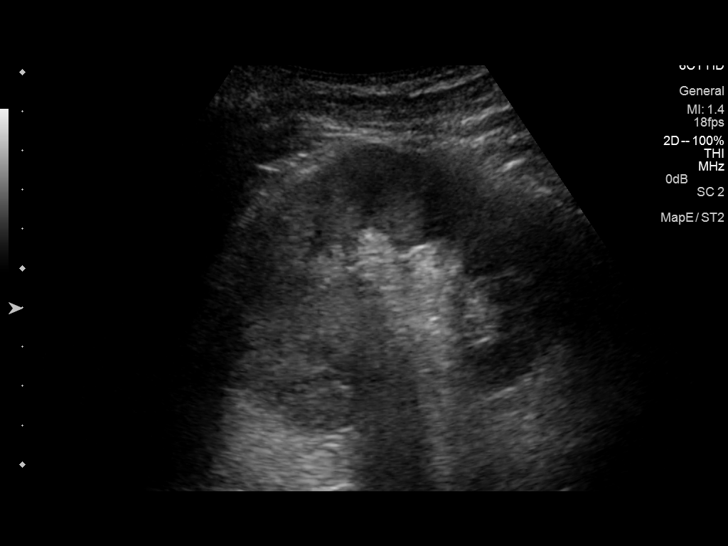
[im 20/22]
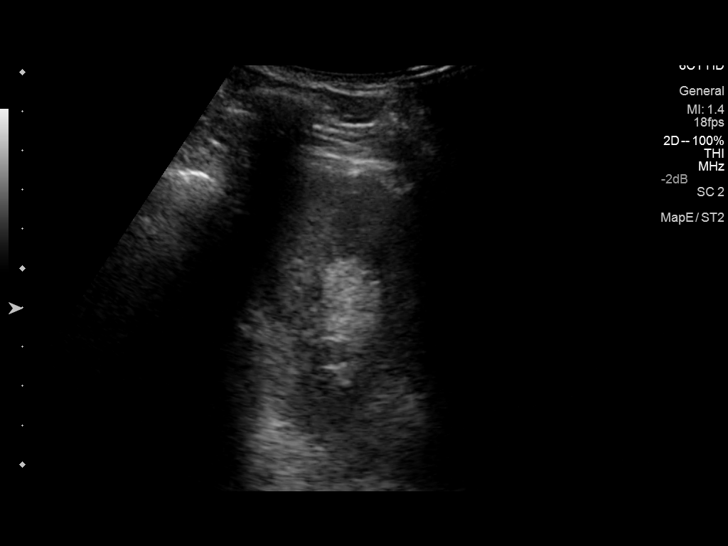
[im 22/22]
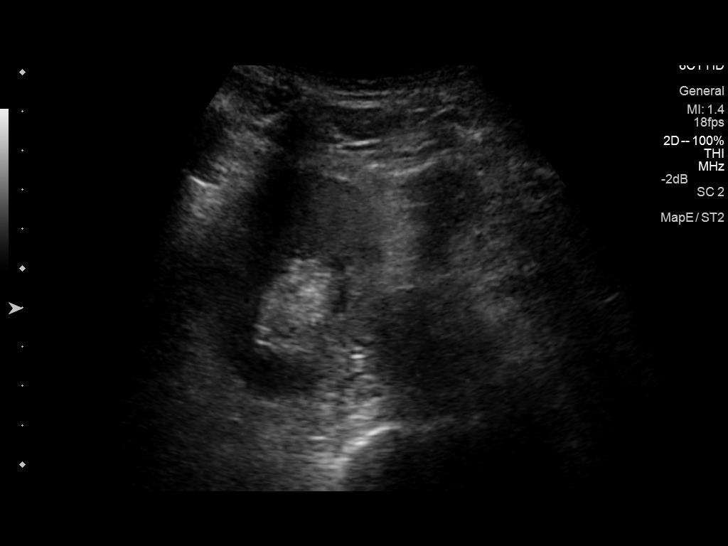

[14 of 22 positions shown; findings below may reference images not displayed]

FINDINGS: Right Kidney:

Length: 10.7 cm. Echogenicity within normal limits. No mass or
hydronephrosis visualized.

Left Kidney:

Length: 11.2 cm. Echogenicity within normal limits. No mass or
hydronephrosis visualized.

Bladder:

Appears normal for degree of bladder distention.
IMPRESSION: Normal renal ultrasound.

## 2016-07-29 ENCOUNTER — Emergency Department (HOSPITAL_COMMUNITY)
Admission: EM | Admit: 2016-07-29 | Discharge: 2016-07-30 | Disposition: A | Discharge status: Home / Self Care | Payer: Medicaid Other | Attending: Emergency Medicine | Admitting: Emergency Medicine

## 2016-07-29 ENCOUNTER — Encounter (HOSPITAL_COMMUNITY): Payer: Self-pay | Admitting: Pharmacy Technician

## 2016-07-29 ENCOUNTER — Emergency Department (HOSPITAL_COMMUNITY): Payer: Medicaid Other

## 2016-07-29 DIAGNOSIS — R0789 Other chest pain: Secondary | ICD-10-CM | POA: Insufficient documentation

## 2016-07-29 DIAGNOSIS — F1010 Alcohol abuse, uncomplicated: Secondary | ICD-10-CM | POA: Diagnosis not present

## 2016-07-29 DIAGNOSIS — E119 Type 2 diabetes mellitus without complications: Secondary | ICD-10-CM | POA: Insufficient documentation

## 2016-07-29 DIAGNOSIS — Z79899 Other long term (current) drug therapy: Secondary | ICD-10-CM | POA: Insufficient documentation

## 2016-07-29 DIAGNOSIS — R079 Chest pain, unspecified: Secondary | ICD-10-CM | POA: Diagnosis present

## 2016-07-29 DIAGNOSIS — I10 Essential (primary) hypertension: Secondary | ICD-10-CM | POA: Diagnosis not present

## 2016-07-29 LAB — CBC WITH DIFFERENTIAL/PLATELET
Basophils Absolute: 0 10*3/uL (ref 0.0–0.1)
Basophils Relative: 0 %
Eosinophils Absolute: 0 10*3/uL (ref 0.0–0.7)
Eosinophils Relative: 1 %
HCT: 34.5 % — ABNORMAL LOW (ref 39.0–52.0)
Hemoglobin: 12.1 g/dL — ABNORMAL LOW (ref 13.0–17.0)
LYMPHS ABS: 1.8 10*3/uL (ref 0.7–4.0)
LYMPHS PCT: 46 %
MCH: 35.3 pg — AB (ref 26.0–34.0)
MCHC: 35.1 g/dL (ref 30.0–36.0)
MCV: 100.6 fL — ABNORMAL HIGH (ref 78.0–100.0)
MONO ABS: 0.5 10*3/uL (ref 0.1–1.0)
MONOS PCT: 13 %
Neutro Abs: 1.6 10*3/uL — ABNORMAL LOW (ref 1.7–7.7)
Neutrophils Relative %: 41 %
Platelets: 61 10*3/uL — ABNORMAL LOW (ref 150–400)
RBC: 3.43 MIL/uL — ABNORMAL LOW (ref 4.22–5.81)
RDW: 14.8 % (ref 11.5–15.5)
WBC: 3.9 10*3/uL — ABNORMAL LOW (ref 4.0–10.5)

## 2016-07-29 LAB — COMPREHENSIVE METABOLIC PANEL
ALBUMIN: 3.3 g/dL — AB (ref 3.5–5.0)
ALT: 35 U/L (ref 17–63)
AST: 142 U/L — AB (ref 15–41)
Alkaline Phosphatase: 140 U/L — ABNORMAL HIGH (ref 38–126)
Anion gap: 14 (ref 5–15)
BUN: 12 mg/dL (ref 6–20)
CHLORIDE: 100 mmol/L — AB (ref 101–111)
CO2: 28 mmol/L (ref 22–32)
CREATININE: 1.13 mg/dL (ref 0.61–1.24)
Calcium: 8.3 mg/dL — ABNORMAL LOW (ref 8.9–10.3)
GFR calc Af Amer: >60 mL/min (ref >60)
GLUCOSE: 96 mg/dL (ref 65–99)
POTASSIUM: 3.6 mmol/L (ref 3.5–5.1)
Sodium: 142 mmol/L (ref 135–145)
Total Bilirubin: 2 mg/dL — ABNORMAL HIGH (ref 0.3–1.2)
Total Protein: 7.5 g/dL (ref 6.5–8.1)

## 2016-07-29 LAB — I-STAT TROPONIN, ED: Troponin i, poc: 0.01 ng/mL (ref 0.00–0.08)

## 2016-07-29 LAB — CBG MONITORING, ED: Glucose-Capillary: 83 mg/dL (ref 65–99)

## 2016-07-29 MED ORDER — ASPIRIN 81 MG PO CHEW
324.0000 mg | CHEWABLE_TABLET | Freq: Once | ORAL | Status: AC
  Administered 2016-07-29: 324 mg via ORAL
  Filled 2016-07-29: qty 4

## 2016-07-29 NOTE — ED Notes (Signed)
Delay in lab draw,  Pt not in room 

## 2016-07-29 NOTE — ED Triage Notes (Signed)
Pt BIB [facility] with reports of CP starting after told at the homeless shelter that he had to sleep outside tonight. Per [facility] pt with R lower ribcage pain tender when palpated. Pt also told [facility] he had 2 seizures this am. Pt with ETOH onboard per [facility].

## 2016-07-30 LAB — RAPID URINE DRUG SCREEN, HOSP PERFORMED
Amphetamines: NOT DETECTED
BARBITURATES: NOT DETECTED
Benzodiazepines: NOT DETECTED
COCAINE: NOT DETECTED
Opiates: NOT DETECTED
Tetrahydrocannabinol: NOT DETECTED

## 2016-07-30 NOTE — ED Provider Notes (Signed)
[facility]-EMERGENCY DEPT Provider Note   CSN: [id number] Arrival date & time: 07/29/16 2048     History    Chief Complaint  Patient presents with  . Chest Pain     HPI Dennis Zhang is a 61 y.o. male.  61yo M w/ extensive PMH including HTN, HLD, seizures, alcohol abuse, chronic pain, homelessness who p/w chest pain. The patient is a poor historian and is not able to characterize his chest pain but states that he began having chest pain yesterday around noon time. He is not able to describe whether it is exertional or whether it has any associated symptoms. The pain is worse with palpation of his central chest. The pain has been intermittent since it began. He also reports that he had a bad anxiety attack yesterday and has had a cough recently. His blood pressure has been going up and down. He states that he had 2 seizures this morning. EMS reports that they picked him up from the homeless shelter after they told him that he would have to sleep outside tonight because they did not have room for him. He denies any cocaine or illicit drug use but does drink alcohol. Patient also reports pain in his back and knees.  Past Medical History:  Diagnosis Date  . Arthritis   . Chronic pain   . Chronic pain   . 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 Hyperlipidemia   . Hypertension   . Seizures [facility](HCC)      Patient Active Problem List   Diagnosis Date Noted  . Leukopenia 05/23/2016  . Lactic acidosis 05/23/2016  . Syncope and collapse 05/22/2016  . Syncope 06/15/2014  . Hypotension 06/15/2014  . Fall   . Arterial hypotension   . Weakness   . Dehydration 06/14/2014  . Protein-calorie malnutrition, severe (HCC) 05/21/2014  . Acute kidney injury (HCC) 05/20/2014  . Orthostatic hypotension 05/20/2014  . Alcohol abuse 05/20/2014  . Transaminitis 05/20/2014  . Thrombocytopenia (HCC) 05/20/2014  . Acute renal failure (HCC) 03/03/2014  . Alcohol intoxication (HCC)  03/03/2014  . Increased anion gap metabolic acidosis 03/03/2014  . Malnutrition of moderate degree (HCC) 03/03/2014  . Hypokalemia 10/06/2012  . Gram-negative bacteremia 10/06/2012  . Leukocytosis 10/03/2012  . Chronic pancreatitis (HCC) 10/03/2012  . AKI (acute kidney injury) (HCC) 10/03/2012  . Alcoholic hepatitis 10/03/2012  . Hypoglycemia secondary to sulfonylurea 10/03/2012    Past Surgical History:  Procedure Laterality Date  . ABDOMINAL SURGERY    . APPENDECTOMY    . HEMORROIDECTOMY          Home Medications    Prior to Admission medications   Medication Sig Start Date End Date Taking? Authorizing Provider  acetaminophen (TYLENOL) 500 MG tablet Take 500 mg by mouth 3 (three) times daily as needed (for pain.).     Historical Provider, MD  feeding supplement, ENSURE COMPLETE, (ENSURE COMPLETE) LIQD Take 237 mLs by mouth 3 (three) times daily between meals. Patient not taking: Reported on 05/22/2016 06/16/14   Rhetta MuraJai-Gurmukh Samtani, MD  folic acid (FOLVITE) 1 MG tablet Take 1 tablet (1 mg total) by mouth daily. 05/24/16   Albertine GratesFang Xu, MD  gabapentin (NEURONTIN) 300 MG capsule Take 1 capsule (300 mg total) by mouth 3 (three) times daily. Patient taking differently: Take 300 mg by mouth 4 (four) times daily as needed (for nerve pain.).  07/31/12   Ruby Colaatherine Schinlever, PA-C  levETIRAcetam (KEPPRA) 500 MG tablet Take 1 tablet (500 mg total) by mouth  2 (two) times daily. 06/13/16   Elson Areas, PA-C  Magnesium Oxide 420 MG TABS Take 420 mg by mouth daily.     Historical Provider, MD  Multiple Vitamin (MULTIVITAMIN WITH MINERALS) TABS tablet Take 1 tablet by mouth daily.    Historical Provider, MD  Naphazoline HCl (CLEAR EYES OP) Place 1 drop into both eyes daily.    Historical Provider, MD  naproxen (NAPROSYN) 375 MG tablet Take 1 tablet (375 mg total) by mouth 2 (two) times daily. 07/26/16   Trixie Dredge, PA-C  pantoprazole (PROTONIX) 40 MG tablet Take 1 tablet (40 mg total) by mouth daily.  05/24/16   Albertine Grates, MD  sildenafil (VIAGRA) 100 MG tablet Take 100 mg by mouth daily as needed for erectile dysfunction.    Historical Provider, MD  tamsulosin (FLOMAX) 0.4 MG CAPS capsule Take 1 capsule (0.4 mg total) by mouth daily after breakfast. 05/23/16   Albertine Grates, MD  thiamine 100 MG tablet Take 1 tablet (100 mg total) by mouth daily. 05/24/16   Albertine Grates, MD  traMADol (ULTRAM) 50 MG tablet Take 1 tablet (50 mg total) by mouth every 6 (six) hours as needed for moderate pain. 05/23/16   Albertine Grates, MD      Family History  Problem Relation Age of Onset  . Hypertension Mother   . Migraines Sister   . Heart failure Brother   . Migraines Brother      Social History  Substance Use Topics  . Smoking status: Current Every Day Smoker    Packs/day: 0.50    Years: 30.00    Types: Cigarettes  . Smokeless tobacco: Never Used  . Alcohol use Yes     Comment: occ     Allergies     Ace inhibitors; Aspirin; and Tylenol [acetaminophen]    Review of Systems  10 Systems reviewed and are negative for acute change except as noted in the HPI.   Physical Exam Updated Vital Signs BP 142/93   Pulse 80   Resp 18   Ht 6\' 2"  (1.88 m)   Wt 155 lb (70.3 kg)   SpO2 96%   BMI 19.90 kg/m   Physical Exam  Constitutional: He is oriented to person, place, and time. No distress.  Thin, chronically ill appearing, awake  HENT:  Head: Normocephalic and atraumatic.  Moist mucous membranes  Eyes: Conjunctivae are normal. Pupils are equal, round, and reactive to light.  Neck: Neck supple.  Cardiovascular: Normal rate, regular rhythm and normal heart sounds.   No murmur heard. Pulmonary/Chest: Effort normal and breath sounds normal. He exhibits tenderness.  L anterior chest near midline exquisitely tender to palpation, no skin changes  Abdominal: Soft. Bowel sounds are normal. He exhibits no distension. There is no tenderness.  Musculoskeletal: He exhibits no edema.  Neurological: He is alert and  oriented to person, place, and time.  Fluent speech  Skin: Skin is warm and dry.  Psychiatric:  Mildly agitated, occasionally abrupt in conversation  Nursing note and vitals reviewed.     ED Treatments / Results  Labs (all labs ordered are listed, but only abnormal results are displayed) Labs Reviewed  COMPREHENSIVE METABOLIC PANEL - Abnormal; Notable for the following:       Result Value   Chloride 100 (*)    Calcium 8.3 (*)    Albumin 3.3 (*)    AST 142 (*)    Alkaline Phosphatase 140 (*)    Total Bilirubin 2.0 (*)  All other components within normal limits  CBC WITH DIFFERENTIAL/PLATELET - Abnormal; Notable for the following:    WBC 3.9 (*)    RBC 3.43 (*)    Hemoglobin 12.1 (*)    HCT 34.5 (*)    MCV 100.6 (*)    MCH 35.3 (*)    Platelets 61 (*)    Neutro Abs 1.6 (*)    All other components within normal limits  RAPID URINE DRUG SCREEN, HOSP PERFORMED  I-STAT TROPOININ, ED  CBG MONITORING, ED     EKG  EKG Interpretation  Date/Time:  Saturday July 29 2016 21:17:52 EST Ventricular Rate:  79 PR Interval:    QRS Duration: 97 QT Interval:  441 QTC Calculation: 506 R Axis:   -96 Text Interpretation:  Sinus rhythm Consider inferior infarct Anterior infarct, old Prolonged QT interval prolonged QT new from previous poor R wave progression through precordial leads Confirmed by LITTLE MD, RACHEL [phone]) on 07/29/2016 10:19:19 PM         Radiology Dg Chest 2 View  Result Date: 07/29/2016 CLINICAL DATA:  Acute chest pain. EXAM: CHEST  2 VIEW COMPARISON:  06/28/2016 and prior exams FINDINGS: The cardiomediastinal silhouette is unremarkable. There is no evidence of focal airspace disease, pulmonary edema, suspicious pulmonary nodule/mass, pleural effusion, or pneumothorax. No acute bony abnormalities are identified. IMPRESSION: No active cardiopulmonary disease. Electronically Signed   By: Harmon Pier M.D.   On: 07/29/2016 22:31    Procedures Procedures  (including critical care time) Procedures  Medications Ordered in ED  Medications  aspirin chewable tablet 324 mg (324 mg Oral Given 07/29/16 2336)     Initial Impression / Assessment and Plan / ED Course  I have reviewed the triage vital signs and the nursing notes.  Pertinent labs & imaging results that were available during my care of the patient were reviewed by me and considered in my medical decision making (see chart for details).     PT p/w intermittent episodes of chest pain. Despite my multiple attempts to obtain more information about exacerbating or alleviating factors and associated symptoms, the patient was tangential in conversation and could not further characterize his chest pain. He was exquisitely tender to left anterior chest near midline which he states reproduces his pain. No associated skin changes. Vital signs here are reassuring. EKG without acute ischemic changes. Gave the patient aspirin. Lab work shows negative troponin, LFTs and bilirubin similar to previous, CBC also similar to previous. CXR negative acute. Pt eating and comfortable on re-examination. Given his tenderness of chest wall on exam that reproduces his pain and his story, I feel that his sx are unlikely to represent ACS or PE. I have asked supportive care. Patient states that he has a follow-up appointment with his PCP this week. Patient discharged in satisfactory condition.  Final Clinical Impressions(s) / ED Diagnoses   Final diagnoses:  Chest wall pain  Alcohol abuse     New Prescriptions   No medications on file       Laurence Spates, MD 07/30/16 [phone]

## 2016-07-31 ENCOUNTER — Encounter (HOSPITAL_COMMUNITY): Payer: Self-pay | Admitting: Emergency Medicine

## 2016-07-31 ENCOUNTER — Observation Stay (HOSPITAL_COMMUNITY)
Admission: EM | Admit: 2016-07-31 | Discharge: 2016-08-02 | Disposition: A | Discharge status: Home / Self Care | Payer: Medicaid Other | Attending: Family Medicine | Admitting: Family Medicine

## 2016-07-31 ENCOUNTER — Emergency Department (HOSPITAL_COMMUNITY): Payer: Medicaid Other

## 2016-07-31 DIAGNOSIS — E119 Type 2 diabetes mellitus without complications: Secondary | ICD-10-CM | POA: Insufficient documentation

## 2016-07-31 DIAGNOSIS — Z87898 Personal history of other specified conditions: Secondary | ICD-10-CM

## 2016-07-31 DIAGNOSIS — R7989 Other specified abnormal findings of blood chemistry: Secondary | ICD-10-CM | POA: Diagnosis not present

## 2016-07-31 DIAGNOSIS — Z79899 Other long term (current) drug therapy: Secondary | ICD-10-CM | POA: Insufficient documentation

## 2016-07-31 DIAGNOSIS — Z791 Long term (current) use of non-steroidal anti-inflammatories (NSAID): Secondary | ICD-10-CM | POA: Insufficient documentation

## 2016-07-31 DIAGNOSIS — F10939 Alcohol use, unspecified with withdrawal, unspecified: Secondary | ICD-10-CM

## 2016-07-31 DIAGNOSIS — D696 Thrombocytopenia, unspecified: Secondary | ICD-10-CM | POA: Insufficient documentation

## 2016-07-31 DIAGNOSIS — R569 Unspecified convulsions: Secondary | ICD-10-CM | POA: Diagnosis not present

## 2016-07-31 DIAGNOSIS — W1830XA Fall on same level, unspecified, initial encounter: Secondary | ICD-10-CM | POA: Insufficient documentation

## 2016-07-31 DIAGNOSIS — I10 Essential (primary) hypertension: Secondary | ICD-10-CM | POA: Diagnosis not present

## 2016-07-31 DIAGNOSIS — Z886 Allergy status to analgesic agent status: Secondary | ICD-10-CM | POA: Insufficient documentation

## 2016-07-31 DIAGNOSIS — R6883 Chills (without fever): Secondary | ICD-10-CM | POA: Diagnosis not present

## 2016-07-31 DIAGNOSIS — T68XXXA Hypothermia, initial encounter: Secondary | ICD-10-CM | POA: Diagnosis not present

## 2016-07-31 DIAGNOSIS — D72819 Decreased white blood cell count, unspecified: Secondary | ICD-10-CM | POA: Insufficient documentation

## 2016-07-31 DIAGNOSIS — Z590 Homelessness: Secondary | ICD-10-CM | POA: Insufficient documentation

## 2016-07-31 DIAGNOSIS — F10239 Alcohol dependence with withdrawal, unspecified: Secondary | ICD-10-CM | POA: Insufficient documentation

## 2016-07-31 DIAGNOSIS — E872 Acidosis, unspecified: Secondary | ICD-10-CM | POA: Diagnosis present

## 2016-07-31 DIAGNOSIS — F17210 Nicotine dependence, cigarettes, uncomplicated: Secondary | ICD-10-CM | POA: Insufficient documentation

## 2016-07-31 LAB — RAPID URINE DRUG SCREEN, HOSP PERFORMED
Amphetamines: NOT DETECTED
BENZODIAZEPINES: NOT DETECTED
Barbiturates: NOT DETECTED
COCAINE: NOT DETECTED
OPIATES: NOT DETECTED
Tetrahydrocannabinol: NOT DETECTED

## 2016-07-31 LAB — CBC WITH DIFFERENTIAL/PLATELET
BASOS ABS: 0 10*3/uL (ref 0.0–0.1)
Basophils Relative: 0 %
Eosinophils Absolute: 0 10*3/uL (ref 0.0–0.7)
Eosinophils Relative: 0 %
HEMATOCRIT: 38.3 % — AB (ref 39.0–52.0)
Hemoglobin: 13.3 g/dL (ref 13.0–17.0)
Lymphocytes Relative: 6 %
Lymphs Abs: 0.8 10*3/uL (ref 0.7–4.0)
MCH: 35 pg — ABNORMAL HIGH (ref 26.0–34.0)
MCHC: 34.7 g/dL (ref 30.0–36.0)
MCV: 100.8 fL — ABNORMAL HIGH (ref 78.0–100.0)
MONO ABS: 0.6 10*3/uL (ref 0.1–1.0)
Monocytes Relative: 5 %
NEUTROS ABS: 11.9 10*3/uL — AB (ref 1.7–7.7)
Neutrophils Relative %: 89 %
Platelets: 59 10*3/uL — ABNORMAL LOW (ref 150–400)
RBC: 3.8 MIL/uL — AB (ref 4.22–5.81)
RDW: 14.4 % (ref 11.5–15.5)
WBC: 13.3 10*3/uL — AB (ref 4.0–10.5)

## 2016-07-31 LAB — URINALYSIS, ROUTINE W REFLEX MICROSCOPIC
BILIRUBIN URINE: NEGATIVE
Glucose, UA: NEGATIVE mg/dL
KETONES UR: NEGATIVE mg/dL
Leukocytes, UA: NEGATIVE
Nitrite: NEGATIVE
PH: 7 (ref 5.0–8.0)
PROTEIN: 30 mg/dL — AB
SQUAMOUS EPITHELIAL / LPF: NONE SEEN
Specific Gravity, Urine: 1.013 (ref 1.005–1.030)

## 2016-07-31 LAB — I-STAT TROPONIN, ED: Troponin i, poc: 0.02 ng/mL (ref 0.00–0.08)

## 2016-07-31 LAB — COMPREHENSIVE METABOLIC PANEL
ALT: 34 U/L (ref 17–63)
AST: 124 U/L — AB (ref 15–41)
Albumin: 3.4 g/dL — ABNORMAL LOW (ref 3.5–5.0)
Alkaline Phosphatase: 139 U/L — ABNORMAL HIGH (ref 38–126)
Anion gap: 18 — ABNORMAL HIGH (ref 5–15)
BUN: 10 mg/dL (ref 6–20)
CHLORIDE: 97 mmol/L — AB (ref 101–111)
CO2: 25 mmol/L (ref 22–32)
Calcium: 8.6 mg/dL — ABNORMAL LOW (ref 8.9–10.3)
Creatinine, Ser: 1.2 mg/dL (ref 0.61–1.24)
GFR calc Af Amer: >60 mL/min (ref >60)
GFR calc non Af Amer: >60 mL/min (ref >60)
GLUCOSE: 129 mg/dL — AB (ref 65–99)
POTASSIUM: 3 mmol/L — AB (ref 3.5–5.1)
Sodium: 140 mmol/L (ref 135–145)
Total Bilirubin: 5.7 mg/dL — ABNORMAL HIGH (ref 0.3–1.2)
Total Protein: 8.3 g/dL — ABNORMAL HIGH (ref 6.5–8.1)

## 2016-07-31 LAB — I-STAT CG4 LACTIC ACID, ED
Lactic Acid, Venous: 6.29 mmol/L (ref 0.5–1.9)
Lactic Acid, Venous: 2.22 mmol/L (ref 0.5–1.9)

## 2016-07-31 LAB — ETHANOL: Alcohol, Ethyl (B): <5 mg/dL (ref <5)

## 2016-07-31 MED ORDER — POTASSIUM CHLORIDE CRYS ER 20 MEQ PO TBCR
40.0000 meq | EXTENDED_RELEASE_TABLET | Freq: Once | ORAL | Status: AC
  Administered 2016-07-31: 40 meq via ORAL
  Filled 2016-07-31: qty 2

## 2016-07-31 MED ORDER — TAMSULOSIN HCL 0.4 MG PO CAPS
0.4000 mg | ORAL_CAPSULE | Freq: Every day | ORAL | Status: DC
  Administered 2016-08-01 – 2016-08-02 (×2): 0.4 mg via ORAL
  Filled 2016-07-31 (×2): qty 1

## 2016-07-31 MED ORDER — MAGNESIUM OXIDE 400 (241.3 MG) MG PO TABS
400.0000 mg | ORAL_TABLET | Freq: Every day | ORAL | Status: DC
  Administered 2016-08-01 – 2016-08-02 (×2): 400 mg via ORAL
  Filled 2016-07-31 (×2): qty 1

## 2016-07-31 MED ORDER — GABAPENTIN 300 MG PO CAPS
300.0000 mg | ORAL_CAPSULE | Freq: Three times a day (TID) | ORAL | Status: DC
  Administered 2016-07-31 – 2016-08-02 (×6): 300 mg via ORAL
  Filled 2016-07-31 (×6): qty 1

## 2016-07-31 MED ORDER — LORAZEPAM 1 MG PO TABS
1.0000 mg | ORAL_TABLET | Freq: Once | ORAL | Status: AC
  Administered 2016-07-31: 1 mg via ORAL
  Filled 2016-07-31: qty 1

## 2016-07-31 MED ORDER — GABAPENTIN 300 MG PO CAPS: 300.0000 mg | ORAL_CAPSULE | Freq: Four times a day (QID) | ORAL | Status: DC | PRN

## 2016-07-31 MED ORDER — NICOTINE 7 MG/24HR TD PT24: 7.0000 mg | MEDICATED_PATCH | Freq: Every day | TRANSDERMAL | Status: DC

## 2016-07-31 MED ORDER — SODIUM CHLORIDE 0.9 % IV BOLUS (SEPSIS)
1000.0000 mL | Freq: Once | INTRAVENOUS | Status: AC
  Administered 2016-07-31: 1000 mL via INTRAVENOUS

## 2016-07-31 MED ORDER — ENSURE ENLIVE PO LIQD
237.0000 mL | Freq: Three times a day (TID) | ORAL | Status: DC
  Administered 2016-07-31 – 2016-08-02 (×5): 237 mL via ORAL

## 2016-07-31 MED ORDER — LEVETIRACETAM 500 MG PO TABS
500.0000 mg | ORAL_TABLET | Freq: Two times a day (BID) | ORAL | Status: DC
  Administered 2016-07-31 – 2016-08-02 (×4): 500 mg via ORAL
  Filled 2016-07-31 (×4): qty 1

## 2016-07-31 MED ORDER — ACETAMINOPHEN 500 MG PO TABS: 500.0000 mg | ORAL_TABLET | Freq: Three times a day (TID) | ORAL | Status: DC | PRN

## 2016-07-31 MED ORDER — PANTOPRAZOLE SODIUM 40 MG PO TBEC
40.0000 mg | DELAYED_RELEASE_TABLET | Freq: Every day | ORAL | Status: DC
  Administered 2016-08-01 – 2016-08-02 (×2): 40 mg via ORAL
  Filled 2016-07-31 (×2): qty 1

## 2016-07-31 MED ORDER — FOLIC ACID 1 MG PO TABS
1.0000 mg | ORAL_TABLET | Freq: Once | ORAL | Status: DC
  Filled 2016-07-31: qty 1

## 2016-07-31 MED ORDER — VITAMIN B-1 100 MG PO TABS
50.0000 mg | ORAL_TABLET | Freq: Once | ORAL | Status: DC
  Filled 2016-07-31: qty 1

## 2016-07-31 MED ORDER — ADULT MULTIVITAMIN W/MINERALS CH
1.0000 | ORAL_TABLET | Freq: Once | ORAL | Status: DC
  Filled 2016-07-31: qty 1

## 2016-07-31 MED ORDER — LORAZEPAM 2 MG/ML IJ SOLN: 1.0000 mg | Freq: Four times a day (QID) | INTRAMUSCULAR | Status: DC | PRN

## 2016-07-31 MED ORDER — LEVETIRACETAM 500 MG PO TABS: 500.0000 mg | ORAL_TABLET | Freq: Two times a day (BID) | ORAL | Status: DC

## 2016-07-31 MED ORDER — GABAPENTIN 600 MG PO TABS
300.0000 mg | ORAL_TABLET | Freq: Three times a day (TID) | ORAL | Status: DC
  Filled 2016-07-31: qty 0.5

## 2016-07-31 MED ORDER — LORAZEPAM 1 MG PO TABS
1.0000 mg | ORAL_TABLET | Freq: Four times a day (QID) | ORAL | Status: DC | PRN
  Administered 2016-08-01 (×3): 1 mg via ORAL
  Filled 2016-07-31 (×3): qty 1

## 2016-07-31 MED ORDER — TRAMADOL HCL 50 MG PO TABS
50.0000 mg | ORAL_TABLET | Freq: Four times a day (QID) | ORAL | Status: DC | PRN
  Administered 2016-07-31 – 2016-08-02 (×6): 50 mg via ORAL
  Filled 2016-07-31 (×6): qty 1

## 2016-07-31 MED ORDER — SODIUM CHLORIDE 0.9 % IV SOLN
Freq: Once | INTRAVENOUS | Status: AC
  Administered 2016-07-31: 16:00:00 via INTRAVENOUS

## 2016-07-31 MED ORDER — SODIUM CHLORIDE 0.9 % IV SOLN
1000.0000 mg | Freq: Once | INTRAVENOUS | Status: AC
  Administered 2016-07-31: 1000 mg via INTRAVENOUS
  Filled 2016-07-31: qty 10

## 2016-07-31 NOTE — ED Notes (Signed)
Pt c/o 10/10 generalized pain.  Angelique Holm. Kirichenko, PA, made aware.

## 2016-07-31 NOTE — ED Notes (Signed)
Gave patient a urinal patient is resting 

## 2016-07-31 NOTE — Consult Note (Signed)
Neurology Consultation Reason for Consult: History of seizures Referring Physician: Dr Clide Deutscher  CC: shivering from being cold in the street  History is obtained from:patient  HPI: Dennis Zhang is a 61 y.o. male who unfortunately is homeless.  He was in the street earlier in the cold wether, trying to get to a shelter and started to feel very debilitated.  He suddenly started shivering excessively and felt extremely weak.  He then collapsed on the ground and onlookers called 911.  When the pt was brought to the ED he reported have a seizure, although he essentially describes his shivering as being the seizure.  He never lost consciousness but he did feel like he was going to "pass out".  He was brought to the ED where primary team gave patient ativan 2/2 suspicion of ETOH wl seizures.  Pt is on keppra at baseline but has not taking it for 2 weeks since he ran out last time.  Pt says he has not been drinking for the last 4 days.   ROS: A 14 point ROS was performed and is negative except as noted in the HPI  Past Medical History:  Diagnosis Date  . Arthritis   . Chronic pain   . Chronic pain   . 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 Hyperlipidemia   . Hypertension   . Seizures (HCC)     Family History  Problem Relation Age of Onset  . Hypertension Mother   . Migraines Sister   . Heart failure Brother   . Migraines Brother     Social History:  reports that he has been smoking Cigarettes.  He has a 15.00 pack-year smoking history. He has never used smokeless tobacco. He reports that he drinks alcohol. He reports that he does not use drugs.  Exam: Current vital signs: BP 130/96   Pulse 93   Temp 98.5 F (36.9 C) (Rectal)   Resp 22   Ht 6\' 2"  (1.88 m)   Wt 68 kg (150 lb)   SpO2 100%   BMI 19.26 kg/m  Vital signs in last 24 hours: Temp:  [97.7 F (36.5 C)-98.5 F (36.9 C)] 98.5 F (36.9 C) (03/12 0921) Pulse Rate:  [93-130] 93 (03/12  1115) Resp:  [18-38] 22 (03/12 1115) BP: (130-172)/(91-116) 130/96 (03/12 1115) SpO2:  [100 %] 100 % (03/12 1115) Weight:  [68 kg (150 lb)] 68 kg (150 lb) (03/12 0828)   Physical Exam  Constitutional: He is thin and disheveled Psych: Affect appropriate to situation Eyes: No scleral injection HENT: No OP obstrucion Head: Normocephalic.  Cardiovascular: Normal rate and regular rhythm.  Respiratory: Effort normal and breath sounds normal to anterior ascultation GI: Soft.  No distension. There is no tenderness.  Skin: WDI  Neuro: Mental Status: Patient is awake, alert, oriented to person, place, month, year, and situation. Patient is able to give a clear and coherent history. No signs of aphasia or neglect Cranial Nerves: II: Visual Fields are full. Pupils are equal, round, and reactive to light.  III,IV, VI: EOMI without ptosis or diploplia.  V: Facial sensation is symmetric to temperature VII: Facial movement is symmetric.  VIII: hearing is intact to voice X: Uvula elevates symmetrically XI: Shoulder shrug is symmetric. XII: tongue is midline without atrophy or fasciculations.  Motor: Tone is normal with slight tremor in left hand. Bulk is very thin. Symmetric generalized weakness was present in all four extremities.  Sensory: Sensation is symmetric to light touch  and temperature in the arms and legs Deep Tendon Reflexes: 2+ and symmetric in the biceps and patellae.  Plantars: Toes are downgoing bilaterally. Cerebellar: FNF and HKS are intact bilaterally    I have reviewed labs in epic and the results pertinent to this consultation are: Lactic acid, elevated white count. Increased AST/ALT ratio  I have reviewed the images obtained: CT head NL  Impression and recs: Shivering.  This can be addressed by warming the patient and with blankets.  Rule out infection.  Regarding the seizure hx, it is recoommended to restart the pt on his home dose keppra regimen, which in the  chart is documented as 500mg  BID.  Also restart gabapentin at doses 300 TID as this too can might have relevant antiepileptic attacks.  Please feel free to reach out with questions.  I will remain available should you need further assistance. For the time being, however, I will sign off

## 2016-07-31 NOTE — ED Notes (Signed)
Placed patient back on the monitor patient is requesting food patient meal is ordered

## 2016-07-31 NOTE — ED Provider Notes (Signed)
[facility] DEPT Provider Note   CSN: [id number] Arrival date & time: 07/31/16  7829     History   Chief Complaint Chief Complaint  Patient presents with  . Fall  . Seizures    HPI Dennis Zhang is a 61 y.o. male.  HPI Dennis Zhang is a 61 y.o. male with history of diabetes, hypertension, seizure disorder, alcohol abuse, chronic pain, presents to emergency department after possible seizure. Patient states he was walking down the railroad tracks from his friend's house, states he remembers feeling very cold and wet in the rain, and reports "falling twice, I think had seizures." He remembers "falling and shaking." He states he did bump his head but denies any headache at this time. He reports pain to the right elbow and right knee but able to move it without difficulty. He states he currently feels weak, cold. Denies nausea or vomiting. He states he hasn't been eating or drinking well recently. He also reports that he hasn't had alcoholic drink in 3 days. He initially told me he hasn't had anything to drink in 2 weeks, which changed to 5 days, which now changed to 3 days. She states he feels shaky. He is currently not taking any medications for his seizures.  Past Medical History:  Diagnosis Date  . Arthritis   . Chronic pain   . Chronic pain   . 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 Hyperlipidemia   . Hypertension   . Seizures [facility])     Patient Active Problem List   Diagnosis Date Noted  . Leukopenia 05/23/2016  . Lactic acidosis 05/23/2016  . Syncope and collapse 05/22/2016  . Syncope 06/15/2014  . Hypotension 06/15/2014  . Fall   . Arterial hypotension   . Weakness   . Dehydration 06/14/2014  . Protein-calorie malnutrition, severe (HCC) 05/21/2014  . Acute kidney injury (HCC) 05/20/2014  . Orthostatic hypotension 05/20/2014  . Alcohol abuse 05/20/2014  . Transaminitis 05/20/2014  . Thrombocytopenia (HCC) 05/20/2014  . Acute renal  failure (HCC) 03/03/2014  . Alcohol intoxication (HCC) 03/03/2014  . Increased anion gap metabolic acidosis 03/03/2014  . Malnutrition of moderate degree (HCC) 03/03/2014  . Hypokalemia 10/06/2012  . Gram-negative bacteremia 10/06/2012  . Leukocytosis 10/03/2012  . Chronic pancreatitis (HCC) 10/03/2012  . AKI (acute kidney injury) (HCC) 10/03/2012  . Alcoholic hepatitis 10/03/2012  . Hypoglycemia secondary to sulfonylurea 10/03/2012    Past Surgical History:  Procedure Laterality Date  . ABDOMINAL SURGERY    . APPENDECTOMY    . HEMORROIDECTOMY         Home Medications    Prior to Admission medications   Medication Sig Start Date End Date Taking? Authorizing Provider  acetaminophen (TYLENOL) 500 MG tablet Take 500 mg by mouth 3 (three) times daily as needed (for pain.).     Historical Provider, MD  feeding supplement, ENSURE COMPLETE, (ENSURE COMPLETE) LIQD Take 237 mLs by mouth 3 (three) times daily between meals. Patient not taking: Reported on 05/22/2016 06/16/14   Rhetta Mura, MD  folic acid (FOLVITE) 1 MG tablet Take 1 tablet (1 mg total) by mouth daily. 05/24/16   Albertine Grates, MD  gabapentin (NEURONTIN) 300 MG capsule Take 1 capsule (300 mg total) by mouth 3 (three) times daily. Patient taking differently: Take 300 mg by mouth 4 (four) times daily as needed (for nerve pain.).  07/31/12   Ruby Cola, PA-C  levETIRAcetam (KEPPRA) 500 MG tablet Take 1 tablet (500 mg total) by mouth  2 (two) times daily. 06/13/16   Elson AreasLeslie K Sofia, PA-C  Magnesium Oxide 420 MG TABS Take 420 mg by mouth daily.     Historical Provider, MD  Multiple Vitamin (MULTIVITAMIN WITH MINERALS) TABS tablet Take 1 tablet by mouth daily.    Historical Provider, MD  Naphazoline HCl (CLEAR EYES OP) Place 1 drop into both eyes daily.    Historical Provider, MD  naproxen (NAPROSYN) 375 MG tablet Take 1 tablet (375 mg total) by mouth 2 (two) times daily. 07/26/16   Trixie DredgeEmily West, PA-C  pantoprazole (PROTONIX) 40  MG tablet Take 1 tablet (40 mg total) by mouth daily. 05/24/16   Albertine GratesFang Xu, MD  sildenafil (VIAGRA) 100 MG tablet Take 100 mg by mouth daily as needed for erectile dysfunction.    Historical Provider, MD  tamsulosin (FLOMAX) 0.4 MG CAPS capsule Take 1 capsule (0.4 mg total) by mouth daily after breakfast. 05/23/16   Albertine GratesFang Xu, MD  thiamine 100 MG tablet Take 1 tablet (100 mg total) by mouth daily. 05/24/16   Albertine GratesFang Xu, MD  traMADol (ULTRAM) 50 MG tablet Take 1 tablet (50 mg total) by mouth every 6 (six) hours as needed for moderate pain. 05/23/16   Albertine GratesFang Xu, MD    Family History Family History  Problem Relation Age of Onset  . Hypertension Mother   . Migraines Sister   . Heart failure Brother   . Migraines Brother     Social History Social History  Substance Use Topics  . Smoking status: Current Every Day Smoker    Packs/day: 0.50    Years: 30.00    Types: Cigarettes  . Smokeless tobacco: Never Used  . Alcohol use Yes     Comment: occ     Allergies   Ace inhibitors; Aspirin; and Tylenol [acetaminophen]   Review of Systems Review of Systems  Constitutional: Negative for chills and fever.  Respiratory: Negative for cough, chest tightness and shortness of breath.   Cardiovascular: Negative for chest pain, palpitations and leg swelling.  Gastrointestinal: Negative for abdominal distention, abdominal pain, diarrhea, nausea and vomiting.  Genitourinary: Negative for dysuria, frequency, hematuria and urgency.  Musculoskeletal: Positive for arthralgias. Negative for myalgias, neck pain and neck stiffness.  Skin: Negative for rash.  Allergic/Immunologic: Negative for immunocompromised state.  Neurological: Positive for tremors and seizures. Negative for dizziness, weakness, light-headedness, numbness and headaches.  All other systems reviewed and are negative.    Physical Exam Updated Vital Signs BP (!) 169/103 (BP Location: Left Arm)   Pulse 116   Temp 97.7 F (36.5 C) (Oral)   Resp 22    Ht 6\' 2"  (1.88 m)   Wt 68 kg   SpO2 100%   BMI 19.26 kg/m   Physical Exam  Constitutional: He is oriented to person, place, and time. He appears well-developed and well-nourished. No distress.  HENT:  Head: Normocephalic and atraumatic.  Eyes: Conjunctivae and EOM are normal. Pupils are equal, round, and reactive to light.  Neck: Normal range of motion. Neck supple.  Cardiovascular: Normal rate, regular rhythm and normal heart sounds.   Pulmonary/Chest: Effort normal. No respiratory distress. He has no wheezes. He has no rales.  Abdominal: Soft. Bowel sounds are normal. He exhibits no distension. There is no tenderness. There is no rebound.  Musculoskeletal: He exhibits no edema.  Normal right elbow, right knee, full ROM of the joints. No TTP. No swelling or bruising.   Neurological: He is alert and oriented to person, place, and time.  Skin: Skin is warm and dry.  Nursing note and vitals reviewed.    ED Treatments / Results  Labs (all labs ordered are listed, but only abnormal results are displayed) Labs Reviewed  CBC WITH DIFFERENTIAL/PLATELET  COMPREHENSIVE METABOLIC PANEL  ETHANOL  RAPID URINE DRUG SCREEN, HOSP PERFORMED  URINALYSIS, ROUTINE W REFLEX MICROSCOPIC  I-STAT CG4 LACTIC ACID, ED  Rosezena Sensor, ED    EKG  EKG Interpretation None       Radiology Dg Chest 2 View  Result Date: 07/29/2016 CLINICAL DATA:  Acute chest pain. EXAM: CHEST  2 VIEW COMPARISON:  06/28/2016 and prior exams FINDINGS: The cardiomediastinal silhouette is unremarkable. There is no evidence of focal airspace disease, pulmonary edema, suspicious pulmonary nodule/mass, pleural effusion, or pneumothorax. No acute bony abnormalities are identified. IMPRESSION: No active cardiopulmonary disease. Electronically Signed   By: Harmon Pier M.D.   On: 07/29/2016 22:31    Procedures Procedures (including critical care time)  Medications Ordered in ED Medications  sodium chloride 0.9 %  bolus 1,000 mL (not administered)  sodium chloride 0.9 % bolus 1,000 mL (not administered)  LORazepam (ATIVAN) tablet 1 mg (not administered)     Initial Impression / Assessment and Plan / ED Course  I have reviewed the triage vital signs and the nursing notes.  Pertinent labs & imaging results that were available during my care of the patient were reviewed by me and considered in my medical decision making (see chart for details).     8:48 AM Pt seen and examined. Pt is homeless, [place] after possibly having two seizures vs falls. He is cold, wet, tachycardic. Will place in dry clothes, warm blankets. Start IV fluids. Check labs. Pt is AAOx3.  9:30 AM Lactic acid 6.29. Pt is afebrile. Still tachycardic. Not hypotensive. Doubt sepsis. Suspect dehydration and alcohol withdrawal. CIWA and ativan ordered. Will reasess. CXR pending.   11:28 AM Spoke with family practice, will admit for persistent tachycardia, possible seizures, elevated lactate. PT is more solmnolent after ativan. CT head ordered. Ordered loading dose of keppra  Vitals:   07/31/16 0945 07/31/16 0950 07/31/16 1015 07/31/16 1045  BP: 148/100  144/91 135/96  Pulse:  106 (!) 130 101  Resp: 24  (!) 29 20  Temp:      TempSrc:      SpO2:   100% 100%  Weight:      Height:         Final Clinical Impressions(s) / ED Diagnoses   Final diagnoses:  Seizure (HCC)  Elevated lactic acid level  Alcohol withdrawal syndrome with complication [facility])    New Prescriptions New Prescriptions   No medications on file     Jaynie Crumble, PA-C 07/31/16 1533    Gerhard Munch, MD 08/01/16 1527

## 2016-07-31 NOTE — ED Notes (Signed)
Neurologist and admitting MD at bedside.

## 2016-07-31 NOTE — ED Triage Notes (Signed)
Pt [place] of [place] via EMS reporting seizures x 2 with fall.  Pt c/o R sided pain to head, shoulder, knee.  Pt denies LOC, states "I remember everything."  Pt reports last ETOH use 3 days ago. Pt wet, shivering, AOx4, using all limbs equally.

## 2016-07-31 NOTE — ED Notes (Signed)
Patient transported to CT 

## 2016-07-31 NOTE — H&P (Signed)
Family Medicine Teaching [facility] Admission History and Physical Service Pager: [phone]  Patient name: Dennis Zhang Medical record number: [id number] Date of birth: [birth year] Age: 61 y.o. Gender: male  Primary Care Provider: [name], MD Consultants: None Code Status: Full  Chief Complaint: Fall  Assessment and Plan: Blayton Huttner is a 61 y.o. male presenting with fall. PMH is significant for seizures, HTN, HLD, DM, and chronic pain.   Fall: Acute. Patient has had multiple episodes in past of syncope and collapse. Patient believes it was due to seizure. Has known seizure disorder and has been noncompliant with medications. Most likely had mechanical fall from weakness. H/o of this in prior notes. Shaking could be tremors from alcohol withdrawal which have also been noted prior.  - Admit to a telemetry bed - neuro consulted - follow-up CT head -neuro checks q4hrs  - PT/OT -CSW   Lactic acidosis: Acute. Lactic acid level 6.29  on admission. Suspected secondary to dehydration. Was given fluid bolus x2 in ED. No signs of infection.  - Trend lactic levels - continue IVF - f/u blood cultures obtained in ED  H/o alcohol abuse: Comes into ED frequently for alcohol intoxication. Alcohol level today negative.  -CIWA protocol -s/p ativan in ED -continue vitamins  Thrombocytopenia: Chronic. Platelet count noted to be 59 on admission previously seen as low as 35 in the past. Suspect secondary to patient's history of alcohol abuse. - Continue to monitor no acute signs of bleeding at this time -avoid anticoagulation  Seizure disorder. Keppra load given in ED.  -continue home Keppra -neuro consulted -may need another EEG  HTN. Elevated on admission to 169/103. Not on any home BP medications. -continue to monitor -pressures stabilizing  ?DM. H/o Dm noted in cart. Only A1c seen was in 2014 and was 5.2. Patient not on medication. -monitor blood sugars  FEN/GI: HH  diet, ensure TID, protonix Prophylaxis: SCDs due to thrombocytopenia  Disposition: Admit for observation overnight.   History of Present Illness:  Dennis Zhang is a 61 y.o. male presenting with fall. PMH significant for alcohol abuse, seizures, thrombocytopenia  History unable to be obtained from patient due to AMS. S/p Ativan. Below history from ED provider.  Patient was brought into the ED by EMS. Patient states that he [place] railroad tracks. He believes that he had 2 seizures. He remembers falling and shaking. He admits to hitting his head. Patient is currently homeless and does not take any medications. He feels weak and cold. He denies nausea, vomiting, headache. Patient reported that he did not drink anything the last 3 days. Patient has a history of seizure disorder. Currently not taking any medications for seizure.  ED provider stated patient did not appear post ictal on arrival or per EMS. Concern was for possible alcohol withdrawal this patient has a history of alcohol abuse. Patient was given Ativan and Keppra load in ED.  Review Of Systems: Per HPI with the following additions: Unable to obtain. ROS  Patient Active Problem List   Diagnosis Date Noted  . Leukopenia 05/23/2016  . Lactic acidosis 05/23/2016  . Syncope and collapse 05/22/2016  . Syncope 06/15/2014  . Hypotension 06/15/2014  . Fall   . Arterial hypotension   . Weakness   . Dehydration 06/14/2014  . Protein-calorie malnutrition, severe (HCC) 05/21/2014  . Acute kidney injury (HCC) 05/20/2014  . Orthostatic hypotension 05/20/2014  . Alcohol abuse 05/20/2014  . Transaminitis 05/20/2014  . Thrombocytopenia (HCC) 05/20/2014  . Acute  renal failure (HCC) 03/03/2014  . Alcohol intoxication (HCC) 03/03/2014  . Increased anion gap metabolic acidosis 03/03/2014  . Malnutrition of moderate degree (HCC) 03/03/2014  . Hypokalemia 10/06/2012  . Gram-negative bacteremia 10/06/2012  . Leukocytosis  10/03/2012  . Chronic pancreatitis (HCC) 10/03/2012  . AKI (acute kidney injury) (HCC) 10/03/2012  . Alcoholic hepatitis 10/03/2012  . Hypoglycemia secondary to sulfonylurea 10/03/2012    Past Medical History: Past Medical History:  Diagnosis Date  . Arthritis   . Chronic pain   . Chronic pain   . 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 Hyperlipidemia   . Hypertension   . Seizures (HCC)     Past Surgical History: Past Surgical History:  Procedure Laterality Date  . ABDOMINAL SURGERY    . APPENDECTOMY    . HEMORROIDECTOMY      Social History: Social History  Substance Use Topics  . Smoking status: Current Every Day Smoker    Packs/day: 0.50    Years: 30.00    Types: Cigarettes  . Smokeless tobacco: Never Used  . Alcohol use Yes     Comment: occ   Additional social history: Homeless Please also refer to relevant sections of EMR.  Family History: Family History  Problem Relation Age of Onset  . Hypertension Mother   . Migraines Sister   . Heart failure Brother   . Migraines Brother     Allergies and Medications: Allergies  Allergen Reactions  . Ace Inhibitors     Kidney injury 05/2014-do not Rx  . Aspirin Nausea Only and Other (See Comments)    Reaction to Bayer aspirin - causes acid reflux and nausea  . Tylenol [Acetaminophen] Nausea Only    States can take Tylenol if has other pain med w/it - like Hydrocodone    No current facility-administered medications on file prior to encounter.    Current Outpatient Prescriptions on File Prior to Encounter  Medication Sig Dispense Refill  . acetaminophen (TYLENOL) 500 MG tablet Take 500 mg by mouth 3 (three) times daily as needed (for pain.).     Marland Kitchen. feeding supplement, ENSURE COMPLETE, (ENSURE COMPLETE) LIQD Take 237 mLs by mouth 3 (three) times daily between meals.    . folic acid (FOLVITE) 1 MG tablet Take 1 tablet (1 mg total) by mouth daily. 30 tablet 0  . gabapentin (NEURONTIN) 300 MG  capsule Take 1 capsule (300 mg total) by mouth 3 (three) times daily. (Patient taking differently: Take 300 mg by mouth 4 (four) times daily as needed (for nerve pain.). ) 45 capsule 0  . levETIRAcetam (KEPPRA) 500 MG tablet Take 1 tablet (500 mg total) by mouth 2 (two) times daily. 20 tablet 0  . Magnesium Oxide 420 MG TABS Take 420 mg by mouth daily.     . Multiple Vitamin (MULTIVITAMIN WITH MINERALS) TABS tablet Take 1 tablet by mouth daily.    . Naphazoline HCl (CLEAR EYES OP) Place 1 drop into both eyes daily.    . naproxen (NAPROSYN) 375 MG tablet Take 1 tablet (375 mg total) by mouth 2 (two) times daily. 20 tablet 0  . pantoprazole (PROTONIX) 40 MG tablet Take 1 tablet (40 mg total) by mouth daily. 30 tablet 0  . tamsulosin (FLOMAX) 0.4 MG CAPS capsule Take 1 capsule (0.4 mg total) by mouth daily after breakfast. 30 capsule 0  . thiamine 100 MG tablet Take 1 tablet (100 mg total) by mouth daily. 30 tablet 0  . traMADol (ULTRAM) 50  MG tablet Take 1 tablet (50 mg total) by mouth every 6 (six) hours as needed for moderate pain. 10 tablet 0  . sildenafil (VIAGRA) 100 MG tablet Take 100 mg by mouth daily as needed for erectile dysfunction.      Objective: BP 148/100   Pulse 106   Temp 98.5 F (36.9 C) (Rectal)   Resp 24   Ht 6\' 2"  (1.88 m)   Wt 150 lb (68 kg)   SpO2 100%   BMI 19.26 kg/m  Exam: General: sleepy but arousable, thin, NAD, lying in bed HEENT: NCAT, vision grossly intact, PERRLA, no injection and anicteric. EOMI. MMM, oral mucosa and oropharynx reveal no lesions or exudates.  Neck: supple, full ROM, no thyromegaly. No deformities, masses, or tenderness noted.  Lungs: CTAB, normal respiratory effort, no accessory muscle use, no crackles, and no wheezes.  Heart: RRR, no M/R/G.  Abdomen: abdomen soft and nontender. No masses, organomegaly or hernias noted. no guarding, no rebound tenderness Pulses: DP/PT are full and equal bilaterally.  Extremities: No cyanosis, clubbing,  edema. Neurologic: No focal deficits, CN grossly intact,+4 strength globally, sensation grossly intact, A&Ox2. GCS 13. Skin: Warm and dry.   Labs and Imaging: Results for orders placed or performed during the hospital encounter of 07/31/16 (from the past 24 hour(s))  Urinalysis, Routine w reflex microscopic     Status: Abnormal   Collection Time: 07/31/16  8:32 AM  Result Value Ref Range   Color, Urine AMBER (A) YELLOW   APPearance CLEAR CLEAR   Specific Gravity, Urine 1.013 1.005 - 1.030   pH 7.0 5.0 - 8.0   Glucose, UA NEGATIVE NEGATIVE mg/dL   Hgb urine dipstick SMALL (A) NEGATIVE   Bilirubin Urine NEGATIVE NEGATIVE   Ketones, ur NEGATIVE NEGATIVE mg/dL   Protein, ur 30 (A) NEGATIVE mg/dL   Nitrite NEGATIVE NEGATIVE   Leukocytes, UA NEGATIVE NEGATIVE   RBC / HPF 0-5 0 - 5 RBC/hpf   WBC, UA 0-5 0 - 5 WBC/hpf   Bacteria, UA RARE (A) NONE SEEN   Squamous Epithelial / LPF NONE SEEN NONE SEEN   Mucous PRESENT    Hyaline Casts, UA PRESENT   CBC with Differential     Status: Abnormal   Collection Time: 07/31/16  8:36 AM  Result Value Ref Range   WBC 13.3 (H) 4.0 - 10.5 K/uL   RBC 3.80 (L) 4.22 - 5.81 MIL/uL   Hemoglobin 13.3 13.0 - 17.0 g/dL   HCT 65.7 (L) 84.6 - 96.2 %   MCV 100.8 (H) 78.0 - 100.0 fL   MCH 35.0 (H) 26.0 - 34.0 pg   MCHC 34.7 30.0 - 36.0 g/dL   RDW 95.2 84.1 - 32.4 %   Platelets 59 (L) 150 - 400 K/uL   Neutrophils Relative % 89 %   Neutro Abs 11.9 (H) 1.7 - 7.7 K/uL   Lymphocytes Relative 6 %   Lymphs Abs 0.8 0.7 - 4.0 K/uL   Monocytes Relative 5 %   Monocytes Absolute 0.6 0.1 - 1.0 K/uL   Eosinophils Relative 0 %   Eosinophils Absolute 0.0 0.0 - 0.7 K/uL   Basophils Relative 0 %   Basophils Absolute 0.0 0.0 - 0.1 K/uL  Comprehensive metabolic panel     Status: Abnormal   Collection Time: 07/31/16  8:36 AM  Result Value Ref Range   Sodium 140 135 - 145 mmol/L   Potassium 3.0 (L) 3.5 - 5.1 mmol/L   Chloride 97 (L) 101 - 111 mmol/L  CO2 25 22 - 32  mmol/L   Glucose, Bld 129 (H) 65 - 99 mg/dL   BUN 10 6 - 20 mg/dL   Creatinine, Ser 1.61 0.61 - 1.24 mg/dL   Calcium 8.6 (L) 8.9 - 10.3 mg/dL   Total Protein 8.3 (H) 6.5 - 8.1 g/dL   Albumin 3.4 (L) 3.5 - 5.0 g/dL   AST 096 (H) 15 - 41 U/L   ALT 34 17 - 63 U/L   Alkaline Phosphatase 139 (H) 38 - 126 U/L   Total Bilirubin 5.7 (H) 0.3 - 1.2 mg/dL   GFR calc non Af Amer >60 >60 mL/min   GFR calc Af Amer >60 >60 mL/min   Anion gap 18 (H) 5 - 15  Ethanol     Status: None   Collection Time: 07/31/16  8:36 AM  Result Value Ref Range   Alcohol, Ethyl (B) <5 <5 mg/dL  I-Stat Troponin, ED (not at [facility])     Status: None   Collection Time: 07/31/16  8:55 AM  Result Value Ref Range   Troponin i, poc 0.02 0.00 - 0.08 ng/mL   Comment 3          I-Stat CG4 Lactic Acid, ED     Status: Abnormal   Collection Time: 07/31/16  8:58 AM  Result Value Ref Range   Lactic Acid, Venous 6.29 (HH) 0.5 - 1.9 mmol/L   Comment NOTIFIED PHYSICIAN   Rapid urine drug screen (hospital performed)     Status: None   Collection Time: 07/31/16  9:21 AM  Result Value Ref Range   Opiates NONE DETECTED NONE DETECTED   Cocaine NONE DETECTED NONE DETECTED   Benzodiazepines NONE DETECTED NONE DETECTED   Amphetamines NONE DETECTED NONE DETECTED   Tetrahydrocannabinol NONE DETECTED NONE DETECTED   Barbiturates NONE DETECTED NONE DETECTED   Ct Head Wo Contrast  Result Date: 07/31/2016 CLINICAL DATA:  Seizures, fall, right-sided head pain. Initial encounter. EXAM: CT HEAD WITHOUT CONTRAST TECHNIQUE: Contiguous axial images were obtained from the base of the skull through the vertex without intravenous contrast. COMPARISON:  06/15/2014. FINDINGS: Brain: No evidence of an acute infarct, acute hemorrhage, mass lesion, mass effect or hydrocephalus. Atrophy. Periventricular low attenuation. Vascular: No hyperdense vessel or unexpected calcification. Skull: Normal. Negative for fracture or focal lesion. Sinuses/Orbits: Mucosal  thickening in the left maxillary sinus. Other: None. IMPRESSION: 1. No acute intracranial abnormality. 2. Atrophy and chronic microvascular white matter ischemic changes. Electronically Signed   By: Leanna Battles M.D.   On: 07/31/2016 11:51   Dg Chest Portable 1 View  Result Date: 07/31/2016 CLINICAL DATA:  61 year old male status post seizure EXAM: PORTABLE CHEST 1 VIEW COMPARISON:  Prior chest x-ray 07/29/2016 FINDINGS: The lungs are clear and negative for focal airspace consolidation, pulmonary edema or suspicious pulmonary nodule. No pleural effusion or pneumothorax. Cardiac and mediastinal contours are within normal limits. No acute fracture or lytic or blastic osseous lesions. The visualized upper abdominal bowel gas pattern is unremarkable. IMPRESSION: No active disease. Electronically Signed   By: Malachy Moan M.D.   On: 07/31/2016 09:36    Pincus Large, DO 07/31/2016, 10:48 AM PGY-3, [facility] FPTS Intern pager: [phone], text pages welcome

## 2016-08-01 DIAGNOSIS — F10239 Alcohol dependence with withdrawal, unspecified: Secondary | ICD-10-CM | POA: Diagnosis not present

## 2016-08-01 DIAGNOSIS — R569 Unspecified convulsions: Secondary | ICD-10-CM | POA: Diagnosis not present

## 2016-08-01 DIAGNOSIS — R7989 Other specified abnormal findings of blood chemistry: Secondary | ICD-10-CM | POA: Diagnosis not present

## 2016-08-01 DIAGNOSIS — E872 Acidosis: Secondary | ICD-10-CM

## 2016-08-01 LAB — COMPREHENSIVE METABOLIC PANEL
ALK PHOS: 115 U/L (ref 38–126)
ALT: 24 U/L (ref 17–63)
ANION GAP: 9 (ref 5–15)
AST: 102 U/L — ABNORMAL HIGH (ref 15–41)
Albumin: 2.5 g/dL — ABNORMAL LOW (ref 3.5–5.0)
BUN: 9 mg/dL (ref 6–20)
CALCIUM: 7.8 mg/dL — AB (ref 8.9–10.3)
CO2: 27 mmol/L (ref 22–32)
Chloride: 101 mmol/L (ref 101–111)
Creatinine, Ser: 1.1 mg/dL (ref 0.61–1.24)
Glucose, Bld: 106 mg/dL — ABNORMAL HIGH (ref 65–99)
Potassium: 3 mmol/L — ABNORMAL LOW (ref 3.5–5.1)
Sodium: 137 mmol/L (ref 135–145)
Total Bilirubin: 4.3 mg/dL — ABNORMAL HIGH (ref 0.3–1.2)
Total Protein: 6.2 g/dL — ABNORMAL LOW (ref 6.5–8.1)

## 2016-08-01 LAB — CBC
HCT: 32.1 % — ABNORMAL LOW (ref 39.0–52.0)
Hemoglobin: 11.2 g/dL — ABNORMAL LOW (ref 13.0–17.0)
MCH: 34.9 pg — AB (ref 26.0–34.0)
MCHC: 34.9 g/dL (ref 30.0–36.0)
MCV: 100 fL (ref 78.0–100.0)
PLATELETS: 52 10*3/uL — AB (ref 150–400)
RBC: 3.21 MIL/uL — ABNORMAL LOW (ref 4.22–5.81)
RDW: 14.3 % (ref 11.5–15.5)
WBC: 8.8 10*3/uL (ref 4.0–10.5)

## 2016-08-01 LAB — HIV ANTIBODY (ROUTINE TESTING W REFLEX): HIV SCREEN 4TH GENERATION: NONREACTIVE

## 2016-08-01 MED ORDER — NICOTINE 7 MG/24HR TD PT24
7.0000 mg | MEDICATED_PATCH | Freq: Every day | TRANSDERMAL | Status: DC
  Administered 2016-08-01 – 2016-08-02 (×3): 7 mg via TRANSDERMAL
  Filled 2016-08-01 (×3): qty 1

## 2016-08-01 MED ORDER — POTASSIUM CHLORIDE CRYS ER 20 MEQ PO TBCR
40.0000 meq | EXTENDED_RELEASE_TABLET | Freq: Two times a day (BID) | ORAL | Status: AC
  Administered 2016-08-01: 40 meq via ORAL
  Filled 2016-08-01: qty 2

## 2016-08-01 NOTE — Progress Notes (Addendum)
3/14: MD concerned patient not able to ride bus. CS[facility]  3:50pm: CSW called [employer] and they directed [facility]. CSW provided homeless resources to patient (and told [facility]). CSW also provided bus pass on patient's chart.  CSW signing off.   CSW received consult regarding patient's housing. Patient requested CSW Pension scheme managercontact [facility] to let them know that patient is in the hospital. CSW left voicemails with [facility] to contact CSW regarding patient's current stay in the hospital.  Cristobal Goldmannadia Blakelee [place] [facility] [phone]

## 2016-08-01 NOTE — Evaluation (Signed)
Physical Therapy Evaluation Patient Details Name: Dennis MillinLevonda Zhang MRN: [id number] DOB: 01/31/1956 Today's Date: 08/01/2016   History of Present Illness  Pt is a 61 y.o. male who presented to the ED following an episode where he was walking in the street and egan shivering excessively and feeling extremely weak resulting in a fall. He was concerned that he was having a seizure. He has a PMH significant for arthritis, chronic pain, diabetes mellitus without complication, hyperlipidemia, hypertension, and seizures.   Clinical Impression  Pt is up to walk with PT and noted [facility]  He is complaining of tremors in LE's and did not see these with use of RW.  He is appropriate for continuation of therapy acutely to make progress with strength and balance of gait and transfers, and will be more successful returning home with these skills and a new walker.    Follow Up Recommendations No PT follow up;Supervision for mobility/OOB    Equipment Recommendations  Rolling walker with 5" wheels    Recommendations for Other Services       Precautions / Restrictions Precautions Precautions: Fall Restrictions Weight Bearing Restrictions: No      Mobility  Bed Mobility Overal bed mobility: Modified Independent Bed Mobility: Supine to Sit     Supine to sit: Min guard     General bed mobility comments: Min guard assist for safety.  Transfers Overall transfer level: Modified independent Equipment used: Rolling walker (2 wheeled) Transfers: Sit to/from Stand Sit to Stand: Modified independent (Device/Increase time)         General transfer comment: Min guard assist for safety. Pt slightly impulsive with this activity.  Ambulation/Gait Ambulation/Gait assistance: Min guard (for safety) Ambulation Distance (Feet): 110 Feet Assistive device: Rolling walker (2 wheeled);1 person hand held assist Gait Pattern/deviations: Step-through pattern;Wide base of support;Trunk  flexed (encouraged upright posture) Gait velocity: reduced Gait velocity interpretation: Below normal speed for age/gender    Stairs Stairs:  (not needed for home)          Wheelchair Mobility    Modified Rankin (Stroke Patients Only)       Balance Overall balance assessment: Needs assistance Sitting-balance support: Feet supported Sitting balance-Leahy Scale: Good     Standing balance support: Bilateral upper extremity supported Standing balance-Leahy Scale: Fair Standing balance comment: pt can control with walker                              Pertinent Vitals/Pain Pain Assessment: Faces Faces Pain Scale: Hurts a little bit Pain Location: back Pain Descriptors / Indicators: Sore Pain Intervention(s): Monitored during session;Repositioned    Home Living Family/patient expects to be discharged to:: Shelter/Homeless                 Additional Comments: [facility]     Prior Function Level of Independence: Independent with assistive device(s)         Comments: Uses cane for ambulation.     Hand Dominance   Dominant Hand: Right    Extremity/Trunk Assessment   Upper Extremity Assessment Upper Extremity Assessment: Overall WFL for tasks assessed RUE Deficits / Details: Tremors present at rest and with activity.Reports pain with shoulder elevation through flexion. Able to achieve ~130 degrees with effort. Biceps 4-/5, triceps 4-/5, grasp 4/5. LUE Deficits / Details: Tremors present at rest and with activity. Reports pain with shoulder elevation through flexion. Able to achieve ~  130 degrees with effort. Biceps 4-/5, triceps 4-/5, grasp 4/5    Lower Extremity Assessment Lower Extremity Assessment: Overall WFL for tasks assessed (demonstrates strength, inconsistent with resistance)    Cervical / Trunk Assessment Cervical / Trunk Assessment: Normal  Communication   Communication: No difficulties  Cognition  Arousal/Alertness: Awake/alert Behavior During Therapy: WFL for tasks assessed/performed Overall Cognitive Status: No family/caregiver present to determine baseline cognitive functioning                 General Comments: slow to follow commands and inconsistent    General Comments      Exercises     Assessment/Plan    PT Assessment Patient needs continued PT services  PT Problem List Decreased range of motion;Decreased activity tolerance;Decreased balance;Decreased mobility;Decreased coordination;Decreased knowledge of use of DME;Decreased safety awareness       PT Treatment Interventions DME instruction;Gait training;Functional mobility training;Therapeutic activities;Therapeutic exercise;Balance training;Neuromuscular re-education;Patient/family education    PT Goals (Current goals can be found in the Care Plan section)  Acute Rehab PT Goals Patient Stated Goal: to stop shaking PT Goal Formulation: With patient Time For Goal Achievement: 08/08/16 Potential to Achieve Goals: Good    Frequency Min 3X/week   Barriers to discharge   no access to PT home health in [facility]    Co-evaluation               End of Session Equipment Utilized During Treatment: Gait belt Activity Tolerance: Patient tolerated treatment well;Patient limited by fatigue Patient left: in bed;with call bell/phone within reach;with bed alarm set Nurse Communication: Mobility status PT Visit Diagnosis: Unsteadiness on feet (R26.81);Repeated falls (R29.6)    Functional Assessment Tool Used: AM-PAC 6 Clicks Basic Mobility;Clinical judgement Functional Limitation: Mobility: Walking and moving around Mobility: Walking and Moving Around Current Status (Z6109): At least 20 percent but less than 40 percent impaired, limited or restricted Mobility: Walking and Moving Around Goal Status [phone]): At least 1 percent but less than 20 percent impaired, limited or restricted    Time:  1029-1047 PT Time Calculation (min) (ACUTE ONLY): 18 min   Charges:   PT Evaluation $PT Eval Moderate Complexity: 1 Procedure PT Treatments $Gait Training: 8-22 mins   PT G Codes:   PT G-Codes **NOT FOR INPATIENT CLASS** Functional Assessment Tool Used: AM-PAC 6 Clicks Basic Mobility;Clinical judgement Functional Limitation: Mobility: Walking and moving around Mobility: Walking and Moving Around Current Status (U9811): At least 20 percent but less than 40 percent impaired, limited or restricted Mobility: Walking and Moving Around Goal Status [phone]): At least 1 percent but less than 20 percent impaired, limited or restricted     Ivar Drape 08/01/2016, 1:21 PM   Samul Dada, PT MS Acute Rehab Dept. Number: [facility] [id number] and [facility] [phone]

## 2016-08-01 NOTE — Evaluation (Signed)
Occupational Therapy Evaluation Patient Details Name: Dennis Zhang MRN: [id number] DOB: 11-Sep-1955 Today's Date: 08/01/2016    History of Present Illness Pt is a 61 y.o. male who presented to the ED following an episode where he was walking in the street and egan shivering excessively and feeling extremely weak resulting in a fall. He was concerned that he was having a seizure. He has a PMH significant for arthritis, chronic pain, diabetes mellitus without complication, hyperlipidemia, hypertension, and seizures.    Clinical Impression   PTA, pt reports independence with [facility] for ADL and functional mobility. He presents with decreased B UE strength and shoulder AROM, tremors, significantly decreased activity tolerance for ADL, and decreased dynamic standing and sitting balance impacting his ability to participate in ADL at PLOF. He would benefit from continued OT services while admitted to improve independence with ADL and functional mobility.     Follow Up Recommendations  No OT follow up          Precautions / Restrictions Precautions Precautions: Fall Restrictions Weight Bearing Restrictions: No      Mobility Bed Mobility Overal bed mobility: Needs Assistance Bed Mobility: Supine to Sit     Supine to sit: Min guard     General bed mobility comments: Min guard assist for safety.  Transfers Overall transfer level: Needs assistance Equipment used: Rolling walker (2 wheeled) Transfers: Sit to/from Stand Sit to Stand: Min guard         General transfer comment: Min guard assist for safety. Pt slightly impulsive with this activity.    Balance Overall balance assessment: Needs assistance Sitting-balance support: No upper extremity supported;Feet supported Sitting balance-Leahy Scale: Fair     Standing balance support: Single extremity supported;During functional activity Standing balance-Leahy Scale: Poor Standing balance comment: Reliant on UE support in standing  position.                            ADL Overall ADL's : Needs assistance/impaired Eating/Feeding: Set up;Supervision/ safety;Sitting   Grooming: Min guard;Standing Grooming Details (indicate cue type and reason): With single UE support. Upper Body Bathing: Set up;Supervision/ safety;Sitting   Lower Body Bathing: Min guard;Sit to/from stand   Upper Body Dressing : Set up;Supervision/safety;Sitting   Lower Body Dressing: Min guard;Sit to/from stand   Toilet Transfer: Minimal assistance;Ambulation (with cane) Toilet Transfer Details (indicate cue type and reason): Simulated with sit<>stand followed by functional ambulation. Toileting- Architect and Hygiene: Min guard;Sit to/from stand       Functional mobility during ADLs: Minimal assistance;Cane General ADL Comments: Pt with fluctuating tremors and "knee knocking." Initially walking well with min guard assist but eventually requiring min assist and reporting concerns for knees buckling.     Vision Baseline Vision/History: Wears glasses Wears Glasses: At all times Patient Visual Report: No change from baseline Vision Assessment?: No apparent visual deficits     Perception     Praxis      Pertinent Vitals/Pain Pain Assessment: Faces Faces Pain Scale: Hurts little more Pain Location: "all over" Pain Descriptors / Indicators: Aching;Discomfort Pain Intervention(s): Monitored during session;Repositioned;RN gave pain meds during session     Hand Dominance Right   Extremity/Trunk Assessment Upper Extremity Assessment Upper Extremity Assessment: RUE deficits/detail;LUE deficits/detail RUE Deficits / Details: Tremors present at rest and with activity.Reports pain with shoulder elevation through flexion. Able to achieve ~130 degrees with effort. Biceps 4-/5, triceps 4-/5, grasp 4/5. LUE Deficits / Details: Tremors present at rest and  with activity. Reports pain with shoulder elevation through flexion.  Able to achieve ~130 degrees with effort. Biceps 4-/5, triceps 4-/5, grasp 4/5   Lower Extremity Assessment Lower Extremity Assessment: Defer to PT evaluation       Communication Communication Communication: No difficulties   Cognition Arousal/Alertness: Awake/alert Behavior During Therapy: WFL for tasks assessed/performed Overall Cognitive Status: No family/caregiver present to determine baseline cognitive functioning                 General Comments: Pt repeating stories frequently and demonstrated some slow processing.   General Comments       Exercises       Shoulder Instructions      Home Living Family/patient expects to be discharged to:: Shelter/Homeless                                 Additional Comments: [facility]       Prior Functioning/Environment Level of Independence: Independent with assistive device(s)        Comments: Uses cane for ambulation.        OT Problem List: Decreased activity tolerance;Decreased strength;Decreased range of motion;Impaired balance (sitting and/or standing);Decreased safety awareness;Decreased knowledge of use of DME or AE;Decreased knowledge of precautions;Pain      OT Treatment/Interventions: Self-care/ADL training;Neuromuscular education;Energy conservation;DME and/or AE instruction;Therapeutic activities;Patient/family education;Balance training    OT Goals(Current goals can be found in the care plan section) Acute Rehab OT Goals Patient Stated Goal: to stop shaking OT Goal Formulation: With patient Time For Goal Achievement: 08/15/16 Potential to Achieve Goals: Good ADL Goals Pt Will Perform Lower Body Bathing: with modified independence;sit to/from stand Pt Will Perform Lower Body Dressing: with modified independence;sit to/from stand Pt Will Transfer to Toilet: with modified independence;ambulating;regular height toilet Pt Will Perform Toileting - Clothing Manipulation  and hygiene: with modified independence;sit to/from stand Pt/caregiver will Perform Home Exercise Program: Increased strength;Right Upper extremity;Left upper extremity;With written HEP provided;Increased ROM  OT Frequency: Min 2X/week   Barriers to D/C:            Co-evaluation              End of Session Equipment Utilized During Treatment: Gait belt [facility] Nurse Communication: Mobility status  Activity Tolerance: Patient tolerated treatment well Patient left:  (Ambulating with PT)  OT Visit Diagnosis: Muscle weakness (generalized) (M62.81);Other abnormalities of gait and mobility (R26.89)                ADL either performed or assessed with clinical judgement  Time: 1020-1038 OT Time Calculation (min): 18 min Charges:  OT General Charges $OT Visit: 1 Procedure OT Evaluation $OT Eval Moderate Complexity: 1 Procedure G-Codes: OT G-codes **NOT FOR INPATIENT CLASS** Functional Assessment Tool Used: AM-PAC 6 Clicks Daily Activity Functional Limitation: Self care Self Care Current Status (I3474(G8987): At least 20 percent but less than 40 percent impaired, limited or restricted Self Care Goal Status (Q5956(G8988): 0 percent impaired, limited or restricted   Doristine Sectionharity A Jaylise Peek, MS OTR/L  Pager: [phone]   Jakai Onofre A Amitai Delaughter 08/01/2016, 11:14 AM

## 2016-08-01 NOTE — Progress Notes (Signed)
Family Medicine Teaching Service Daily Progress Note Intern Pager: [phone]  Patient name: Dennis Zhang Medical record number: [id number] Date of birth: March 09, 1956 Age: 61 y.o. Gender: male  Primary Care Provider: [facility] Consultants: Neurology Code Status: Full  Pt Overview and Major Events to Date:  1. Admit to FMTS  Assessment and Plan: Dennis Zhang is a 61 y.o. male presenting with fall. PMH is significant for seizures, HTN, HLD, DM, and chronic pain.   Fall with h/o seizure disorder: History of seizure disorder and patient attributes fall to multiple seizures. CT head showed no acute abnormalities. Seen by neurology who were not convinced that patient had seizure. Recommended restarting his seizure medication.  -neuro checks q4hrs  -continue home regimen of keppra 500mg  BID and gabapentin 300mg  TID - PT/OT- recommending no f/u -CSW   Lactic acidosis:  LA from 6.29>2.22 and leukocytosis to 8.8 this AM. No signs of infection.  Likely 2/2 volume depletion. Now euvolemic.  - s/p 2L boluses and MIVF @125 .  - f/u blood cultures obtained in ED- NGTD  H/o alcohol abuse: Comes into ED frequently for alcohol intoxication. Alcohol level today negative.  -CIWA protocol- scoring 3 for visual disturbances -s/p ativan in ED -continue vitamins  Thrombocytopenia: Chronic.Platelet count noted to be 59 on admission previously seen as low as 35 in the past.Suspect secondary to patient's history of alcohol abuse. - Continue to monitor no acute signs of bleeding at this time -avoid anticoagulation- on SCDS  Seizure disorder. Keppra load given in ED.  -continue home Keppra and gabapentin -neuro consulted -may need another EEG  HTN. Elevated on admission to 169/103. Not on any home BP medications. Now normotensive -continue to monitor -pressures stabilizing  ?DM. H/o Dm noted in cart. Only A1c seen was in 2014 and was 5.2. Patient not on medication. WNL this AM.   -monitor blood sugars  FEN/GI: HH diet, ensure TID, protonix Prophylaxis: SCDs due to thrombocytopenia  Disposition:  DC pending social work consult for medication and homelessness  Subjective:  Feels well this morning. No complaints.   Objective: Temp:  [97.7 F (36.5 C)-99.1 F (37.3 C)] 98.7 F (37.1 C) (03/13 0654) Pulse Rate:  [84-130] 84 (03/13 0654) Resp:  [16-38] 20 (03/13 0654) BP: (119-172)/(83-116) 127/83 (03/13 0654) SpO2:  [95 %-100 %] 100 % (03/13 0654) Weight:  [136 lb 0.4 oz (61.7 kg)-150 lb (68 kg)] 136 lb 0.4 oz (61.7 kg) (03/12 1523) Physical Exam: General: 61 year old male sitting up in bed appearing comfortable and in no acute distress Cardiovascular: Regular rate and rhythm, no murmurs rubs or gallops Respiratory: Breathing comfortably on room air, but also patient bilaterally, with no wheezing or rhonchi Abdomen: Soft, nontender nondistended, no palpable masses,+BS Neuro: AAOx3, CN2-12 WNL, no changes in sensation, strength 5/5 throughout.   Laboratory:  Recent Labs Lab 07/29/16 2231 07/31/16 0836 08/01/16 0450  WBC 3.9* 13.3* 8.8  HGB 12.1* 13.3 11.2*  HCT 34.5* 38.3* 32.1*  PLT 61* 59* 52*    Recent Labs Lab 07/29/16 2231 07/31/16 0836 08/01/16 0450  NA 142 140 137  K 3.6 3.0* 3.0*  CL 100* 97* 101  CO2 28 25 27   BUN 12 10 9   CREATININE 1.13 1.20 1.10  CALCIUM 8.3* 8.6* 7.8*  PROT 7.5 8.3* 6.2*  BILITOT 2.0* 5.7* 4.3*  ALKPHOS 140* 139* 115  ALT 35 34 24  AST 142* 124* 102*  GLUCOSE 96 129* 106*    Imaging/Diagnostic Tests: Dg Chest 2 View  Result Date: 07/29/2016  CLINICAL DATA:  Acute chest pain. EXAM: CHEST  2 VIEW COMPARISON:  06/28/2016 and prior exams FINDINGS: The cardiomediastinal silhouette is unremarkable. There is no evidence of focal airspace disease, pulmonary edema, suspicious pulmonary nodule/mass, pleural effusion, or pneumothorax. No acute bony abnormalities are identified. IMPRESSION: No active  cardiopulmonary disease. Electronically Signed   By: Harmon PierJeffrey  Hu M.D.   On: 07/29/2016 22:31   Ct Head Wo Contrast  Result Date: 07/31/2016 CLINICAL DATA:  Seizures, fall, right-sided head pain. Initial encounter. EXAM: CT HEAD WITHOUT CONTRAST TECHNIQUE: Contiguous axial images were obtained from the base of the skull through the vertex without intravenous contrast. COMPARISON:  06/15/2014. FINDINGS: Brain: No evidence of an acute infarct, acute hemorrhage, mass lesion, mass effect or hydrocephalus. Atrophy. Periventricular low attenuation. Vascular: No hyperdense vessel or unexpected calcification. Skull: Normal. Negative for fracture or focal lesion. Sinuses/Orbits: Mucosal thickening in the left maxillary sinus. Other: None. IMPRESSION: 1. No acute intracranial abnormality. 2. Atrophy and chronic microvascular white matter ischemic changes. Electronically Signed   By: Leanna BattlesMelinda  Blietz M.D.   On: 07/31/2016 11:51   Dg Chest Portable 1 View  Result Date: 07/31/2016 CLINICAL DATA:  61 year old male status post seizure EXAM: PORTABLE CHEST 1 VIEW COMPARISON:  Prior chest x-ray 07/29/2016 FINDINGS: The lungs are clear and negative for focal airspace consolidation, pulmonary edema or suspicious pulmonary nodule. No pleural effusion or pneumothorax. Cardiac and mediastinal contours are within normal limits. No acute fracture or lytic or blastic osseous lesions. The visualized upper abdominal bowel gas pattern is unremarkable. IMPRESSION: No active disease. Electronically Signed   By: Malachy MoanHeath  McCullough M.D.   On: 07/31/2016 09:36    Renne Muscaaniel L Warden, MD 08/01/2016, 7:14 AM PGY-1, [place] [facility] FPTS Intern pager: [phone], text pages welcome

## 2016-08-02 DIAGNOSIS — R569 Unspecified convulsions: Secondary | ICD-10-CM | POA: Diagnosis not present

## 2016-08-02 DIAGNOSIS — R7989 Other specified abnormal findings of blood chemistry: Secondary | ICD-10-CM | POA: Diagnosis not present

## 2016-08-02 DIAGNOSIS — E872 Acidosis: Secondary | ICD-10-CM | POA: Diagnosis not present

## 2016-08-02 DIAGNOSIS — F10239 Alcohol dependence with withdrawal, unspecified: Secondary | ICD-10-CM | POA: Diagnosis not present

## 2016-08-02 LAB — BASIC METABOLIC PANEL
ANION GAP: 10 (ref 5–15)
BUN: 10 mg/dL (ref 6–20)
CHLORIDE: 99 mmol/L — AB (ref 101–111)
CO2: 28 mmol/L (ref 22–32)
Calcium: 8.3 mg/dL — ABNORMAL LOW (ref 8.9–10.3)
Creatinine, Ser: 1.03 mg/dL (ref 0.61–1.24)
GFR calc Af Amer: >60 mL/min (ref >60)
GFR calc non Af Amer: >60 mL/min (ref >60)
GLUCOSE: 123 mg/dL — AB (ref 65–99)
POTASSIUM: 3.7 mmol/L (ref 3.5–5.1)
SODIUM: 137 mmol/L (ref 135–145)

## 2016-08-02 LAB — CBC
HEMATOCRIT: 31.7 % — AB (ref 39.0–52.0)
HEMOGLOBIN: 11.1 g/dL — AB (ref 13.0–17.0)
MCH: 35.1 pg — ABNORMAL HIGH (ref 26.0–34.0)
MCHC: 35 g/dL (ref 30.0–36.0)
MCV: 100.3 fL — AB (ref 78.0–100.0)
Platelets: 53 10*3/uL — ABNORMAL LOW (ref 150–400)
RBC: 3.16 MIL/uL — ABNORMAL LOW (ref 4.22–5.81)
RDW: 14.1 % (ref 11.5–15.5)
WBC: 7.2 10*3/uL (ref 4.0–10.5)

## 2016-08-02 MED ORDER — LEVETIRACETAM 500 MG PO TABS: 500.0000 mg | ORAL_TABLET | Freq: Two times a day (BID) | ORAL | 0 refills | Status: DC

## 2016-08-02 NOTE — Progress Notes (Addendum)
NURSING PROGRESS NOTE  Dennis MillinLevonda Zhang [id number] Discharge Data: 08/02/2016 7:25 PM Attending Provider: Doreene ElandKehinde T Eniola, MD WUJ:[id number]:[facility]     Dennis MillinLevonda Dils [facility] Taxi cab has been called. per MD order.  Discussed with the patient the After Visit Summary and all questions fully answered. All IV's discontinued with no bleeding noted. All belongings returned to patient for patient to take home. Pt was taken downstairs accompanied by a staff member to meet his taxi cab.   Last Vital Signs:  Blood pressure 139/81, pulse (!) 107, temperature 99.1 F (37.3 C), temperature source Oral, resp. rate 18, height 6\' 2"  (1.88 m), weight 61.7 kg (136 lb 0.4 oz), SpO2 98 %.  Discharge Medication List Allergies as of 08/02/2016      Reactions   Ace Inhibitors    Kidney injury 05/2014-do not Rx   Aspirin Nausea Only, Other (See Comments)   Reaction to Bayer aspirin - causes acid reflux and nausea   Tylenol [acetaminophen] Nausea Only   States can take Tylenol if has other pain med w/it - like Hydrocodone      Medication List    STOP taking these medications   sildenafil 100 MG tablet Commonly known as:  VIAGRA     TAKE these medications   acetaminophen 500 MG tablet Commonly known as:  TYLENOL Take 500 mg by mouth 3 (three) times daily as needed (for pain.).   CLEAR EYES OP Place 1 drop into both eyes daily.   feeding supplement (ENSURE COMPLETE) Liqd Take 237 mLs by mouth 3 (three) times daily between meals.   folic acid 1 MG tablet Commonly known as:  FOLVITE Take 1 tablet (1 mg total) by mouth daily.   gabapentin 300 MG capsule Commonly known as:  NEURONTIN Take 1 capsule (300 mg total) by mouth 3 (three) times daily. What changed:  when to take this  reasons to take this   levETIRAcetam 500 MG tablet Commonly known as:  KEPPRA Take 1 tablet (500 mg total) by mouth 2 (two) times daily. What changed:  Another medication with the same name was  added. Make sure you understand how and when to take each.   levETIRAcetam 500 MG tablet Commonly known as:  KEPPRA Take 1 tablet (500 mg total) by mouth 2 (two) times daily. What changed:  You were already taking a medication with the same name, and this prescription was added. Make sure you understand how and when to take each.   Magnesium Oxide 420 MG Tabs Take 420 mg by mouth daily.   multivitamin with minerals Tabs tablet Take 1 tablet by mouth daily.   naproxen 375 MG tablet Commonly known as:  NAPROSYN Take 1 tablet (375 mg total) by mouth 2 (two) times daily.   pantoprazole 40 MG tablet Commonly known as:  PROTONIX Take 1 tablet (40 mg total) by mouth daily.   tamsulosin 0.4 MG Caps capsule Commonly known as:  FLOMAX Take 1 capsule (0.4 mg total) by mouth daily after breakfast.   thiamine 100 MG tablet Take 1 tablet (100 mg total) by mouth daily.   traMADol 50 MG tablet Commonly known as:  ULTRAM Take 1 tablet (50 mg total) by mouth every 6 (six) hours as needed for moderate pain.

## 2016-08-02 NOTE — Discharge Summary (Signed)
Family Medicine Teaching [facility] Discharge Summary  Patient name: Dennis MillinLevonda Zhang Medical record number: [id number] Date of birth: 02/01/1956 Age: 61 y.o. Gender: male Date of Admission: 07/31/2016  Date of Discharge: 08/02/16 Admitting Physician: Doreene ElandKehinde T Eniola, MD  [facility] Consultants: Neurology  Indication for Hospitalization: fall  Discharge Diagnoses/Problem List:  Fall with h/o seizure disorder Alcohol Abuse Thrombocytopenia Homelessness   Disposition: Discharge to shelter  Discharge Condition: Stable, improved  Discharge Exam:  General: 61 year old male sitting up in bed appearing comfortable and in no acute distress Cardiovascular: Regular rate and rhythm, no murmurs rubs or gallops Respiratory: Breathing comfortably on room air, but also patient bilaterally, with no wheezing or rhonchi Abdomen: Soft, nontender nondistended, no palpable masses,+BS Neuro: AAOx3, CN2-12 WNL, no changes in sensation, strength 5/5 throughout.   Brief Hospital Course:  [facility] [place] train [place] after a possible seizure. Denied LOC and there were no witnesses during this episode.  He had reportedly ran out of keppra 2 weeks prior. He was loaded with keppra and had a CT head that showed no acute changes.  Does have a history of alcohol abuse and last drink was nearly one week prior. Evaluated by Neurology who recommended restarting home keppra dose and did not recommend EEG.  Patient was monitored for withdrawal with CIWA and at the time of discharge was only scoring subjectively and <5.  During admission he had no seizure activity and at the time of discharge was breathing comfortably on room air and was evaluated by PT/OT who recommended no follow up.  Social work was consulted to assist patient in placement for homelessness and provided him with taxi cab to a warm shelter.   Issues for Follow Up:   1. Seizures: Cont keppra 500mg  BID.  2. Falls: follow up with PCP to help with setting up PT 3. Homelessness: DC to warm shelter and [facility] 4. Alcohol abuse: provided patient with alcohol recovery resources  Significant Procedures: CThead  Significant Labs and Imaging:   Recent Labs Lab 08/02/16 0619 08/03/16 2120 08/05/16 0356  WBC 7.2 5.7 4.6  HGB 11.1* 10.7* 11.0*  HCT 31.7* 30.4* 31.7*  PLT 53* 64* 92*    Recent Labs Lab 07/29/16 2231 07/31/16 0836 08/01/16 0450 08/02/16 0619 08/03/16 2120 08/05/16 0356  NA 142 140 137 137 136 138  K 3.6 3.0* 3.0* 3.7 3.6 3.1*  CL 100* 97* 101 99* 98* 98*  CO2 28 25 27 28 30 27   GLUCOSE 96 129* 106* 123* 95 96  BUN 12 10 9 10 16 11   CREATININE 1.13 1.20 1.10 1.03 1.10 1.05  CALCIUM 8.3* 8.6* 7.8* 8.3* 8.4* 8.6*  ALKPHOS 140* 139* 115  --  137*  --   AST 142* 124* 102*  --  100*  --   ALT 35 34 24  --  31  --   ALBUMIN 3.3* 3.4* 2.5*  --  2.8*  --    CT head:  IMPRESSION: 1. No acute intracranial abnormality. 2. Atrophy and chronic microvascular white matter ischemic changes.  Results/Tests Pending at Time of Discharge: blood cultures NGTDx4 days  Discharge Medications:  Allergies as of 08/02/2016      Reactions   Ace Inhibitors    Kidney injury 05/2014-do not Rx   Aspirin Nausea Only, Other (See Comments)   Reaction to Bayer aspirin - causes acid reflux and nausea  Tylenol [acetaminophen] Nausea Only   States can take Tylenol if has other pain med w/it - like Hydrocodone      Medication List    STOP taking these medications   sildenafil 100 MG tablet Commonly known as:  VIAGRA     TAKE these medications   acetaminophen 500 MG tablet Commonly known as:  TYLENOL Take 500 mg by mouth 3 (three) times daily as needed (for pain.).   CLEAR EYES OP Place 1 drop into both eyes daily.   feeding supplement (ENSURE COMPLETE) Liqd Take 237 mLs by mouth 3 (three)  times daily between meals.   folic acid 1 MG tablet Commonly known as:  FOLVITE Take 1 tablet (1 mg total) by mouth daily.   gabapentin 300 MG capsule Commonly known as:  NEURONTIN Take 1 capsule (300 mg total) by mouth 3 (three) times daily. What changed:  when to take this  reasons to take this   levETIRAcetam 500 MG tablet Commonly known as:  KEPPRA Take 1 tablet (500 mg total) by mouth 2 (two) times daily. What changed:  Another medication with the same name was added. Make sure you understand how and when to take each.   levETIRAcetam 500 MG tablet Commonly known as:  KEPPRA Take 1 tablet (500 mg total) by mouth 2 (two) times daily. What changed:  You were already taking a medication with the same name, and this prescription was added. Make sure you understand how and when to take each.   Magnesium Oxide 420 MG Tabs Take 420 mg by mouth daily.   naproxen 375 MG tablet Commonly known as:  NAPROSYN Take 1 tablet (375 mg total) by mouth 2 (two) times daily.   pantoprazole 40 MG tablet Commonly known as:  PROTONIX Take 1 tablet (40 mg total) by mouth daily.   tamsulosin 0.4 MG Caps capsule Commonly known as:  FLOMAX Take 1 capsule (0.4 mg total) by mouth daily after breakfast.   thiamine 100 MG tablet Take 1 tablet (100 mg total) by mouth daily.   traMADol 50 MG tablet Commonly known as:  ULTRAM Take 1 tablet (50 mg total) by mouth every 6 (six) hours as needed for moderate pain.       Discharge Instructions: Please refer to Patient Instructions section of EMR for full details.  Patient was counseled important signs and symptoms that should prompt return to medical care, changes in medications, dietary instructions, activity restrictions, and follow up appointments.   Follow-Up Appointments:   Renne Musca, MD 08/05/2016, 9:18 AM PGY-1, [facility]

## 2016-08-02 NOTE — Progress Notes (Signed)
PT Cancellation Note  Patient Details Name: Dennis Zhang MRN: [id number] DOB: 01/06/1956   Cancelled Treatment:    Reason Eval/Treat Not Completed: Fatigue/lethargy limiting ability to participate (Pt tremoring in bed and reports pain in his side.  Informed RN he refused therapy Pt [facility] as he is homeless.  )   Florestine Aversimee J Madina Galati 08/02/2016, 4:25 PM Joycelyn RuaAimee Mercedez Boule, PTA pager [phone]

## 2016-08-02 NOTE — Discharge Instructions (Signed)
Please take your keppra daily as prescribed. We hope that you have luck finding housing. It was very nice meeting you. Take care

## 2016-08-02 NOTE — Progress Notes (Signed)
Family Medicine Teaching Service Daily Progress Note Intern Pager: [phone]  Patient name: Dennis Zhang Medical record number: [id number] Date of birth: 09/11/1955 Age: 61 y.o. Gender: male  [facility] Consultants: Neurology Code Status: Full  Pt Overview and Major Events to Date:  1. Admit to FMTS  Assessment and Plan: Dennis Zhang is a 61 y.o. male presenting with fall. PMH is significant for seizures, HTN, HLD, DM, and chronic pain.   Fall with h/o seizure disorder, stable:  -neuro checks q4hrs  -continue home regimen of keppra 500mg  BID and gabapentin 300mg  TID - PT/OT- recommending no f/u  Lactic acidosis, resolved: Now euvolemic.  - s/p 2L boluses and MIVF @125 .  - f/u blood cultures obtained in ED- NGTD  H/o alcohol abuse: Comes into ED frequently for alcohol intoxication.  -CIWA protocol- scoring 4 this AM and 11, 9 overnight. Subjectively scoring for anxiety, headache and tremor (patient has chronic tremor) -s/p ativan in ED -continue vitamins - CSW consulted;appreciate assistance.   Thrombocytopenia, stable: Chronic.Platelet count noted to be 59 on admission previously seen as low as 35 in the past.Suspect secondary to patient's history of alcohol abuse. - Continue to monitor no acute signs of bleeding at this time -avoid anticoagulation- on SCDS  Seizure disorder, stable:  -continue home Keppra and gabapentin -neuro consulted; signed off  HTN, resolved: Elevated on admission to 169/103. Not on any home BP medications. Now normotensive -continue to monitor -pressures stabilizing  ?DM. H/o Dm noted in cart. Only A1c seen was in 2014 and was 5.2. Patient not on medication. WNL this AM.  -monitor blood sugars  Homelessness: [facility] [place]. Homeless resources provided to patient and [facility]. Also provided bus pass to patient.   FEN/GI: HH diet, ensure  TID, protonix Prophylaxis: SCDs due to thrombocytopenia  Disposition:  DC today hopefully with a taxi  Subjective:  Feels well this morning. No complaints.   Objective: Temp:  [98.4 F (36.9 C)-99.1 F (37.3 C)] 99.1 F (37.3 C) (03/14 0655) Pulse Rate:  [92-107] 107 (03/14 0655) Resp:  [16-18] 18 (03/14 0655) BP: (120-139)/(77-88) 139/81 (03/14 0655) SpO2:  [98 %-100 %] 98 % (03/14 0655) Physical Exam: General: 61 year old male sitting up in bed appearing comfortable and in no acute distress Cardiovascular: Regular rate and rhythm, no murmurs rubs or gallops Respiratory: Breathing comfortably on room air, but also patient bilaterally, with no wheezing or rhonchi Abdomen: Soft, nontender nondistended, no palpable masses,+BS Neuro: AAOx3, CN2-12 WNL, no changes in sensation, strength 5/5 throughout.   Laboratory:  Recent Labs Lab 07/31/16 0836 08/01/16 0450 08/02/16 0619  WBC 13.3* 8.8 7.2  HGB 13.3 11.2* 11.1*  HCT 38.3* 32.1* 31.7*  PLT 59* 52* 53*    Recent Labs Lab 07/29/16 2231 07/31/16 0836 08/01/16 0450 08/02/16 0619  NA 142 140 137 137  K 3.6 3.0* 3.0* 3.7  CL 100* 97* 101 99*  CO2 28 25 27 28   BUN 12 10 9 10   CREATININE 1.13 1.20 1.10 1.03  CALCIUM 8.3* 8.6* 7.8* 8.3*  PROT 7.5 8.3* 6.2*  --   BILITOT 2.0* 5.7* 4.3*  --   ALKPHOS 140* 139* 115  --   ALT 35 34 24  --   AST 142* 124* 102*  --   GLUCOSE 96 129* 106* 123*    Imaging/Diagnostic Tests: Dg Chest 2 View  Result Date: 07/29/2016 CLINICAL DATA:  Acute chest pain. EXAM:  CHEST  2 VIEW COMPARISON:  06/28/2016 and prior exams FINDINGS: The cardiomediastinal silhouette is unremarkable. There is no evidence of focal airspace disease, pulmonary edema, suspicious pulmonary nodule/mass, pleural effusion, or pneumothorax. No acute bony abnormalities are identified. IMPRESSION: No active cardiopulmonary disease. Electronically Signed   By: Harmon Pier M.D.   On: 07/29/2016 22:31   Ct Head Wo  Contrast  Result Date: 07/31/2016 CLINICAL DATA:  Seizures, fall, right-sided head pain. Initial encounter. EXAM: CT HEAD WITHOUT CONTRAST TECHNIQUE: Contiguous axial images were obtained from the base of the skull through the vertex without intravenous contrast. COMPARISON:  06/15/2014. FINDINGS: Brain: No evidence of an acute infarct, acute hemorrhage, mass lesion, mass effect or hydrocephalus. Atrophy. Periventricular low attenuation. Vascular: No hyperdense vessel or unexpected calcification. Skull: Normal. Negative for fracture or focal lesion. Sinuses/Orbits: Mucosal thickening in the left maxillary sinus. Other: None. IMPRESSION: 1. No acute intracranial abnormality. 2. Atrophy and chronic microvascular white matter ischemic changes. Electronically Signed   By: Leanna Battles M.D.   On: 07/31/2016 11:51   Dg Chest Portable 1 View  Result Date: 07/31/2016 CLINICAL DATA:  61 year old male status post seizure EXAM: PORTABLE CHEST 1 VIEW COMPARISON:  Prior chest x-ray 07/29/2016 FINDINGS: The lungs are clear and negative for focal airspace consolidation, pulmonary edema or suspicious pulmonary nodule. No pleural effusion or pneumothorax. Cardiac and mediastinal contours are within normal limits. No acute fracture or lytic or blastic osseous lesions. The visualized upper abdominal bowel gas pattern is unremarkable. IMPRESSION: No active disease. Electronically Signed   By: Malachy Moan M.D.   On: 07/31/2016 09:36    Renne Musca, MD 08/02/2016, 8:11 AM PGY-1, [facility] Family Medicine FPTS Intern pager: [phone], text pages welcome

## 2016-08-03 ENCOUNTER — Encounter (HOSPITAL_COMMUNITY): Payer: Self-pay

## 2016-08-03 ENCOUNTER — Emergency Department (HOSPITAL_COMMUNITY)
Admission: EM | Admit: 2016-08-03 | Discharge: 2016-08-04 | Disposition: A | Discharge status: Home / Self Care | Payer: Non-veteran care | Attending: Emergency Medicine | Admitting: Emergency Medicine

## 2016-08-03 DIAGNOSIS — R197 Diarrhea, unspecified: Secondary | ICD-10-CM | POA: Diagnosis not present

## 2016-08-03 DIAGNOSIS — R945 Abnormal results of liver function studies: Secondary | ICD-10-CM | POA: Diagnosis not present

## 2016-08-03 DIAGNOSIS — D649 Anemia, unspecified: Secondary | ICD-10-CM | POA: Diagnosis not present

## 2016-08-03 DIAGNOSIS — M25551 Pain in right hip: Secondary | ICD-10-CM | POA: Insufficient documentation

## 2016-08-03 DIAGNOSIS — R739 Hyperglycemia, unspecified: Secondary | ICD-10-CM | POA: Insufficient documentation

## 2016-08-03 DIAGNOSIS — G8929 Other chronic pain: Secondary | ICD-10-CM | POA: Diagnosis not present

## 2016-08-03 DIAGNOSIS — I10 Essential (primary) hypertension: Secondary | ICD-10-CM | POA: Diagnosis not present

## 2016-08-03 DIAGNOSIS — F17210 Nicotine dependence, cigarettes, uncomplicated: Secondary | ICD-10-CM | POA: Insufficient documentation

## 2016-08-03 DIAGNOSIS — R112 Nausea with vomiting, unspecified: Secondary | ICD-10-CM

## 2016-08-03 DIAGNOSIS — R1110 Vomiting, unspecified: Secondary | ICD-10-CM | POA: Diagnosis present

## 2016-08-03 LAB — BASIC METABOLIC PANEL
Anion gap: 8 (ref 5–15)
BUN: 16 mg/dL (ref 6–20)
CALCIUM: 8.4 mg/dL — AB (ref 8.9–10.3)
CO2: 30 mmol/L (ref 22–32)
CREATININE: 1.1 mg/dL (ref 0.61–1.24)
Chloride: 98 mmol/L — ABNORMAL LOW (ref 101–111)
GFR calc Af Amer: >60 mL/min (ref >60)
GFR calc non Af Amer: >60 mL/min (ref >60)
GLUCOSE: 95 mg/dL (ref 65–99)
Potassium: 3.6 mmol/L (ref 3.5–5.1)
Sodium: 136 mmol/L (ref 135–145)

## 2016-08-03 LAB — CBC WITH DIFFERENTIAL/PLATELET
Basophils Absolute: 0 10*3/uL (ref 0.0–0.1)
Basophils Relative: 0 %
Eosinophils Absolute: 0 10*3/uL (ref 0.0–0.7)
Eosinophils Relative: 1 %
HEMATOCRIT: 30.4 % — AB (ref 39.0–52.0)
Hemoglobin: 10.7 g/dL — ABNORMAL LOW (ref 13.0–17.0)
LYMPHS PCT: 28 %
Lymphs Abs: 1.6 10*3/uL (ref 0.7–4.0)
MCH: 34.5 pg — ABNORMAL HIGH (ref 26.0–34.0)
MCHC: 35.2 g/dL (ref 30.0–36.0)
MCV: 98.1 fL (ref 78.0–100.0)
MONO ABS: 0.7 10*3/uL (ref 0.1–1.0)
MONOS PCT: 13 %
NEUTROS ABS: 3.3 10*3/uL (ref 1.7–7.7)
Neutrophils Relative %: 59 %
Platelets: 64 10*3/uL — ABNORMAL LOW (ref 150–400)
RBC: 3.1 MIL/uL — ABNORMAL LOW (ref 4.22–5.81)
RDW: 14.1 % (ref 11.5–15.5)
WBC: 5.7 10*3/uL (ref 4.0–10.5)

## 2016-08-03 LAB — HEPATIC FUNCTION PANEL
ALT: 31 U/L (ref 17–63)
AST: 100 U/L — AB (ref 15–41)
Albumin: 2.8 g/dL — ABNORMAL LOW (ref 3.5–5.0)
Alkaline Phosphatase: 137 U/L — ABNORMAL HIGH (ref 38–126)
BILIRUBIN DIRECT: 1.9 mg/dL — AB (ref 0.1–0.5)
BILIRUBIN TOTAL: 3.1 mg/dL — AB (ref 0.3–1.2)
Indirect Bilirubin: 1.2 mg/dL — ABNORMAL HIGH (ref 0.3–0.9)
Total Protein: 7 g/dL (ref 6.5–8.1)

## 2016-08-03 MED ORDER — IBUPROFEN 800 MG PO TABS
800.0000 mg | ORAL_TABLET | Freq: Once | ORAL | Status: AC
  Administered 2016-08-03: 800 mg via ORAL

## 2016-08-03 NOTE — ED Notes (Signed)
Gave pt urinal 

## 2016-08-03 NOTE — ED Provider Notes (Addendum)
[facility]-EMERGENCY DEPT Provider Note   CSN: [id number] Arrival date & time: 08/03/16  1616     History   Chief Complaint Chief Complaint  Patient presents with  . Generalized Body Aches  . Fever  . Emesis  . Diarrhea    HPI Dennis Zhang is a 61 y.o. male.Patient complained of vomiting and diarrhea . Had 2 episodes of diarrhea today one episode of vomiting yesterday. He is presently hungry. He also complains of bilateral hip pain which he's had for many years. EMS reports temperature 102.7. With Tylenol prior to coming here. No other associated symptoms.  HPI  Past Medical History:  Diagnosis Date  . Arthritis   . Chronic pain   . Chronic pain   . 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 Hyperlipidemia   . Hypertension   . Seizures [facility])     Patient Active Problem List   Diagnosis Date Noted  . Alcohol withdrawal syndrome with complication (HCC)   . Elevated lactic acid level   . Seizure (HCC)   . Leukopenia 05/23/2016  . Lactic acidosis 05/23/2016  . Syncope and collapse 05/22/2016  . Syncope 06/15/2014  . Hypotension 06/15/2014  . Fall   . Arterial hypotension   . Weakness   . Dehydration 06/14/2014  . Protein-calorie malnutrition, severe (HCC) 05/21/2014  . Acute kidney injury (HCC) 05/20/2014  . Orthostatic hypotension 05/20/2014  . Alcohol abuse 05/20/2014  . Transaminitis 05/20/2014  . Thrombocytopenia (HCC) 05/20/2014  . Acute renal failure (HCC) 03/03/2014  . Alcohol intoxication (HCC) 03/03/2014  . Increased anion gap metabolic acidosis 03/03/2014  . Malnutrition of moderate degree (HCC) 03/03/2014  . Hypokalemia 10/06/2012  . Gram-negative bacteremia 10/06/2012  . Leukocytosis 10/03/2012  . Chronic pancreatitis (HCC) 10/03/2012  . AKI (acute kidney injury) (HCC) 10/03/2012  . Alcoholic hepatitis 10/03/2012  . Hypoglycemia secondary to sulfonylurea 10/03/2012    Past Surgical History:  Procedure Laterality Date  .  ABDOMINAL SURGERY    . APPENDECTOMY    . HEMORROIDECTOMY         Home Medications    Prior to Admission medications   Medication Sig Start Date End Date Taking? Authorizing Provider  acetaminophen (TYLENOL) 500 MG tablet Take 500 mg by mouth 3 (three) times daily as needed (for pain.).    Yes Historical Provider, MD  feeding supplement, ENSURE COMPLETE, (ENSURE COMPLETE) LIQD Take 237 mLs by mouth 3 (three) times daily between meals. 06/16/14  Yes Rhetta Mura, MD  folic acid (FOLVITE) 1 MG tablet Take 1 tablet (1 mg total) by mouth daily. 05/24/16  Yes Albertine Grates, MD  gabapentin (NEURONTIN) 300 MG capsule Take 1 capsule (300 mg total) by mouth 3 (three) times daily. Patient taking differently: Take 300 mg by mouth 4 (four) times daily as needed (for nerve pain.).  07/31/12  Yes Ruby Cola, PA-C  Hydrocodone-Acetaminophen (VICODIN HP) 10-300 MG TABS Take 1 tablet by mouth every 8 (eight) hours as needed.   Yes Historical Provider, MD  levETIRAcetam (KEPPRA) 500 MG tablet Take 1 tablet (500 mg total) by mouth 2 (two) times daily. 06/13/16  Yes Elson Areas, PA-C  Magnesium Oxide 420 MG TABS Take 420 mg by mouth daily.    Yes Historical Provider, MD  Naphazoline HCl (CLEAR EYES OP) Place 1 drop into both eyes daily.   Yes Historical Provider, MD  naproxen (NAPROSYN) 375 MG tablet Take 1 tablet (375 mg total) by mouth 2 (two) times daily. 07/26/16  Yes  Trixie DredgeEmily West, PA-C  pantoprazole (PROTONIX) 40 MG tablet Take 1 tablet (40 mg total) by mouth daily. 05/24/16  Yes Albertine GratesFang Xu, MD  traMADol (ULTRAM) 50 MG tablet Take 1 tablet (50 mg total) by mouth every 6 (six) hours as needed for moderate pain. 05/23/16  Yes Albertine GratesFang Xu, MD  levETIRAcetam (KEPPRA) 500 MG tablet Take 1 tablet (500 mg total) by mouth 2 (two) times daily. Patient not taking: Reported on 08/03/2016 08/02/16   Renne Muscaaniel L Warden, MD  tamsulosin [facility](FLOMAX) 0.4 MG CAPS capsule Take 1 capsule (0.4 mg total) by mouth daily after  breakfast. Patient not taking: Reported on 08/03/2016 05/23/16   Albertine GratesFang Xu, MD  thiamine 100 MG tablet Take 1 tablet (100 mg total) by mouth daily. Patient not taking: Reported on 08/03/2016 05/24/16   Albertine GratesFang Xu, MD    Family History Family History  Problem Relation Age of Onset  . Hypertension Mother   . Migraines Sister   . Heart failure Brother   . Migraines Brother     Social History Social History  Substance Use Topics  . Smoking status: Current Every Day Smoker    Packs/day: 0.50    Years: 30.00    Types: Cigarettes  . Smokeless tobacco: Never Used  . Alcohol use Yes     Comment: occ     Allergies   Ace inhibitors; Aspirin; and Tylenol [acetaminophen]   Review of Systems Review of Systems  Constitutional: Positive for fever.  HENT: Negative.   Respiratory: Negative.   Cardiovascular: Negative.   Gastrointestinal: Positive for diarrhea.  Musculoskeletal: Positive for arthralgias and gait problem.       Walks with cane. Bilateral hip pain  Skin: Negative.   Allergic/Immunologic: Positive for immunocompromised state.       Diabetic  Psychiatric/Behavioral: Negative.   All other systems reviewed and are negative.    Physical Exam Updated Vital Signs BP 130/85 (BP Location: Right Arm)   Pulse 97   Temp 98.7 F (37.1 C) (Oral)   Resp 18   Ht 6\' 2"  (1.88 m)   Wt 136 lb (61.7 kg)   SpO2 100%   BMI 17.46 kg/m   Physical Exam  Constitutional:  Chronically ill appearing  HENT:  Head: Normocephalic and atraumatic.  Eyes: Conjunctivae are normal. Pupils are equal, round, and reactive to light.  Neck: Neck supple. No tracheal deviation present. No thyromegaly present.  Cardiovascular: Normal rate and regular rhythm.   No murmur heard. Pulmonary/Chest: Effort normal and breath sounds normal.  Abdominal: Soft. Bowel sounds are normal. He exhibits no distension. There is no tenderness.  Musculoskeletal: Normal range of motion. He exhibits no edema or tenderness.   Neurological: He is alert. Coordination normal.  Skin: Skin is warm and dry. No rash noted.  Psychiatric: He has a normal mood and affect.  Nursing note and vitals reviewed.    ED Treatments / Results  Labs (all labs ordered are listed, but only abnormal results are displayed) Labs Reviewed  CBC WITH DIFFERENTIAL/PLATELET  BASIC METABOLIC PANEL  URINALYSIS, ROUTINE W REFLEX MICROSCOPIC    EKG  EKG Interpretation None       Radiology No results found.  Procedures Procedures (including critical care time)  Medications Ordered in ED Medications - No data to display  Results for orders placed or performed during the hospital encounter of 08/03/16  CBC with Differential/Platelet  Result Value Ref Range   WBC 5.7 4.0 - 10.5 K/uL   RBC 3.10 (L) 4.22 -  5.81 MIL/uL   Hemoglobin 10.7 (L) 13.0 - 17.0 g/dL   HCT 81.1 (L) 91.4 - 78.2 %   MCV 98.1 78.0 - 100.0 fL   MCH 34.5 (H) 26.0 - 34.0 pg   MCHC 35.2 30.0 - 36.0 g/dL   RDW 95.6 21.3 - 08.6 %   Platelets 64 (L) 150 - 400 K/uL   Neutrophils Relative % 59 %   Neutro Abs 3.3 1.7 - 7.7 K/uL   Lymphocytes Relative 28 %   Lymphs Abs 1.6 0.7 - 4.0 K/uL   Monocytes Relative 13 %   Monocytes Absolute 0.7 0.1 - 1.0 K/uL   Eosinophils Relative 1 %   Eosinophils Absolute 0.0 0.0 - 0.7 K/uL   Basophils Relative 0 %   Basophils Absolute 0.0 0.0 - 0.1 K/uL  Basic metabolic panel  Result Value Ref Range   Sodium 136 135 - 145 mmol/L   Potassium 3.6 3.5 - 5.1 mmol/L   Chloride 98 (L) 101 - 111 mmol/L   CO2 30 22 - 32 mmol/L   Glucose, Bld 95 65 - 99 mg/dL   BUN 16 6 - 20 mg/dL   Creatinine, Ser 5.78 0.61 - 1.24 mg/dL   Calcium 8.4 (L) 8.9 - 10.3 mg/dL   GFR calc non Af Amer >60 >60 mL/min   GFR calc Af Amer >60 >60 mL/min   Anion gap 8 5 - 15  Hepatic function panel  Result Value Ref Range   Total Protein 7.0 6.5 - 8.1 g/dL   Albumin 2.8 (L) 3.5 - 5.0 g/dL   AST 469 (H) 15 - 41 U/L   ALT 31 17 - 63 U/L   Alkaline  Phosphatase 137 (H) 38 - 126 U/L   Total Bilirubin 3.1 (H) 0.3 - 1.2 mg/dL   Bilirubin, Direct 1.9 (H) 0.1 - 0.5 mg/dL   Indirect Bilirubin 1.2 (H) 0.3 - 0.9 mg/dL   Dg Chest 2 View  Result Date: 07/29/2016 CLINICAL DATA:  Acute chest pain. EXAM: CHEST  2 VIEW COMPARISON:  06/28/2016 and prior exams FINDINGS: The cardiomediastinal silhouette is unremarkable. There is no evidence of focal airspace disease, pulmonary edema, suspicious pulmonary nodule/mass, pleural effusion, or pneumothorax. No acute bony abnormalities are identified. IMPRESSION: No active cardiopulmonary disease. Electronically Signed   By: Harmon Pier M.D.   On: 07/29/2016 22:31   Ct Head Wo Contrast  Result Date: 07/31/2016 CLINICAL DATA:  Seizures, fall, right-sided head pain. Initial encounter. EXAM: CT HEAD WITHOUT CONTRAST TECHNIQUE: Contiguous axial images were obtained from the base of the skull through the vertex without intravenous contrast. COMPARISON:  06/15/2014. FINDINGS: Brain: No evidence of an acute infarct, acute hemorrhage, mass lesion, mass effect or hydrocephalus. Atrophy. Periventricular low attenuation. Vascular: No hyperdense vessel or unexpected calcification. Skull: Normal. Negative for fracture or focal lesion. Sinuses/Orbits: Mucosal thickening in the left maxillary sinus. Other: None. IMPRESSION: 1. No acute intracranial abnormality. 2. Atrophy and chronic microvascular white matter ischemic changes. Electronically Signed   By: Leanna Battles M.D.   On: 07/31/2016 11:51   Dg Chest Portable 1 View  Result Date: 07/31/2016 CLINICAL DATA:  61 year old male status post seizure EXAM: PORTABLE CHEST 1 VIEW COMPARISON:  Prior chest x-ray 07/29/2016 FINDINGS: The lungs are clear and negative for focal airspace consolidation, pulmonary edema or suspicious pulmonary nodule. No pleural effusion or pneumothorax. Cardiac and mediastinal contours are within normal limits. No acute fracture or lytic or blastic osseous  lesions. The visualized upper abdominal bowel gas pattern is  unremarkable. IMPRESSION: No active disease. Electronically Signed   By: Malachy Moan M.D.   On: 07/31/2016 09:36   Initial Impression / Assessment and Plan / ED Course  I have reviewed the triage vital signs and the nursing notes.  Pertinent labs & imaging results that were available during my care of the patient were reviewed by me and considered in my medical decision making (see chart for details).    1230 amPatient feels much improved after treatment with Advil. He also ate a meal in the emergency department. Patient had no documented fever while here. Elevated liver function tests and anemia are chronic and hyperbilirubinemia is chronic. Plan encourage oral hydration. Advil for pain.  1:20 AM patient is alert ambulate with a cane with shuffling gait. He states he's been walking this way for the past 1.5 years.  Plan d/c  To home . advil prn pain encourage oral hydration. F/u with [facility] as needed Final Clinical Impressions(s) / ED Diagnoses  Diagnosis #1 nausea vomiting diarrhea  Final diagnoses:  None  #2 chronic hip pain #3anemia #4hyperbilirubinemia #5 abnormal liver function tests  New Prescriptions New Prescriptions   No medications on file     Doug Sou, MD 08/04/16 0120    Doug Sou, MD 08/04/16 0126    Doug Sou, MD 08/04/16 0128    Doug Sou, MD 08/09/16 1610    Doug Sou, MD 08/15/16 1052

## 2016-08-03 NOTE — ED Notes (Signed)
Pt is here for generalized illness.  Pt tells me that he has not had anything to eat all day and requests meal.  Attempted to order tray to hold for him so he can have food once EDP clears him to eat.  It is too late to order tray per Diplomatic Services operational officersecretary.  Informed pt.  Await order that pt may eat and will give him ED snacks at this time

## 2016-08-03 NOTE — ED Notes (Addendum)
Brought pt Malawiturkey sandwich, ham sandwich and 2 cheese sticks, 2 apple sauces and sprite.  Pt is quite dissatisfied about having to have waited this long for food.  Attempted to apologize to pt.  EDP notified that pt would like pain meds.  Pt is requesting Ultram, order received for ibuprofen.

## 2016-08-03 NOTE — Progress Notes (Signed)
Occupational therapy Progress Note (late entry)  Pt requires supervision to min guard assist for ADLs.  He is tremulous with activity.  Worked with him on use of cell phone.     08/02/16 1800  OT Visit Information  Last OT Received On 08/03/16  Assistance Needed +1  History of Present Illness Pt is a 61 y.o. male who presented to the ED following an episode where he was walking in the street and egan shivering excessively and feeling extremely weak resulting in a fall. He was concerned that he was having a seizure. He has a PMH significant for arthritis, chronic pain, diabetes mellitus without complication, hyperlipidemia, hypertension, and seizures.   Precautions  Precautions Fall  Pain Assessment  Pain Assessment No/denies pain  Cognition  Arousal/Alertness Awake/alert  Behavior During Therapy Anxious  Overall Cognitive Status No family/caregiver present to determine baseline cognitive functioning  ADL  Overall ADL's  Needs assistance/impaired  Grooming Wash/dry hands;Wash/dry face;Oral care;Brushing hair;Min guard;Standing  Lower Body Bathing Sit to/from stand;Supervison/ safety  Lower Body Dressing Sit to/from stand;Supervision/safety  Toilet Transfer Ambulation;Comfort height toilet;Min guard  Toileting- Clothing Manipulation and Hygiene Sit to/from stand;Supervision/safety  Functional mobility during ADLs Supervision/safety  General ADL Comments Pt anxious about phone not working.  Pt apparently with new cell phone.  instructed him in use and practiced using.  Due to tremors, pt with significant difficulty using phone and accurately dialing.   Adjusted settings with improvement noted, however, pt with low battery.  Instructed pt to have [facility] continue to work with him on use Visual merchandiser(RN charging phone at nsg station).     Bed Mobility  Overal bed mobility Modified Independent  Balance  Sitting balance-Leahy Scale Good  Standing balance-Leahy Scale Fair  Standing balance comment pt  with wide BOS and unsteadiness   Transfers  Overall transfer level Modified independent  OT - End of Session  Equipment Utilized During Treatment Gait belt  Activity Tolerance Patient tolerated treatment well  Patient left in bed;with call bell/phone within reach;with chair alarm set  Nurse Communication Mobility status  OT Assessment/Plan  OT Plan Discharge plan remains appropriate  OT Visit Diagnosis Muscle weakness (generalized) (M62.81);Other abnormalities of gait and mobility (R26.89)  OT Frequency (ACUTE ONLY) Min 2X/week  OT Equipment None recommended by OT  AM-PAC OT "6 Clicks" Daily Activity Outcome Measure  Help from another person eating meals? 4  Help from another person taking care of personal grooming? 3  Help from another person toileting, which includes using toliet, bedpan, or urinal? 3  Help from another person bathing (including washing, rinsing, drying)? 3  Help from another person to put on and taking off regular upper body clothing? 3  Help from another person to put on and taking off regular lower body clothing? 3  6 Click Score 19  ADL G Code Conversion CK  OT Goal Progression  Progress towards OT goals Progressing toward goals  OT Time Calculation  OT Start Time (ACUTE ONLY) 1429  OT Stop Time (ACUTE ONLY) 1511  OT Time Calculation (min) 42 min  OT General Charges  $OT Visit 1 Procedure  OT Treatments  $Therapeutic Activity 38-52 mins  Reynolds AmericanWendi Genice Kimberlin, OTR/L [phone]

## 2016-08-03 NOTE — ED Triage Notes (Signed)
Per EMS [facility] . Patient and staff report that the patient has had symptoms intermittently x 6 months of chills, vomiting, diarrhea and body aches. EMS reported a fever 102.7 tympanically. Tylenol 1000 mg po given and facility gave the patient Aspirin 81 mg piror to EMS arrival.

## 2016-08-04 NOTE — ED Notes (Signed)
Please note that pt was not discharged on time as he is homeless and this section is being closed and I felt he deserved the courtesy of additional sleep as it was not tying up a room for no reason.

## 2016-08-04 NOTE — ED Notes (Signed)
Pt was given back his cell which I charged for him.  Pt appears in no distress and feels fine when awakened.  Will allow him to continue to sleep

## 2016-08-04 NOTE — Discharge Instructions (Signed)
Take Advil as directed for pain. Follow-up with her primary care physician as needed.Make sure that you drink at least six 8 ounce glasses of water each day in order to stay well-hydrated.

## 2016-08-04 NOTE — ED Notes (Signed)
Pt given a bus pass at discharge  

## 2016-08-05 ENCOUNTER — Telehealth: Payer: Self-pay | Admitting: Family Medicine

## 2016-08-05 ENCOUNTER — Emergency Department (HOSPITAL_COMMUNITY)
Admission: EM | Admit: 2016-08-05 | Discharge: 2016-08-05 | Disposition: A | Discharge status: Home / Self Care | Payer: Medicaid Other | Attending: Emergency Medicine | Admitting: Emergency Medicine

## 2016-08-05 ENCOUNTER — Encounter (HOSPITAL_COMMUNITY): Payer: Self-pay

## 2016-08-05 DIAGNOSIS — E119 Type 2 diabetes mellitus without complications: Secondary | ICD-10-CM | POA: Insufficient documentation

## 2016-08-05 DIAGNOSIS — F17210 Nicotine dependence, cigarettes, uncomplicated: Secondary | ICD-10-CM | POA: Insufficient documentation

## 2016-08-05 DIAGNOSIS — R251 Tremor, unspecified: Secondary | ICD-10-CM | POA: Insufficient documentation

## 2016-08-05 DIAGNOSIS — I10 Essential (primary) hypertension: Secondary | ICD-10-CM | POA: Insufficient documentation

## 2016-08-05 DIAGNOSIS — Z79899 Other long term (current) drug therapy: Secondary | ICD-10-CM | POA: Diagnosis not present

## 2016-08-05 DIAGNOSIS — F419 Anxiety disorder, unspecified: Secondary | ICD-10-CM | POA: Diagnosis present

## 2016-08-05 LAB — BASIC METABOLIC PANEL
Anion gap: 13 (ref 5–15)
BUN: 11 mg/dL (ref 6–20)
CHLORIDE: 98 mmol/L — AB (ref 101–111)
CO2: 27 mmol/L (ref 22–32)
Calcium: 8.6 mg/dL — ABNORMAL LOW (ref 8.9–10.3)
Creatinine, Ser: 1.05 mg/dL (ref 0.61–1.24)
GFR calc non Af Amer: >60 mL/min (ref >60)
Glucose, Bld: 96 mg/dL (ref 65–99)
POTASSIUM: 3.1 mmol/L — AB (ref 3.5–5.1)
SODIUM: 138 mmol/L (ref 135–145)

## 2016-08-05 LAB — CBC
HEMATOCRIT: 31.7 % — AB (ref 39.0–52.0)
HEMOGLOBIN: 11 g/dL — AB (ref 13.0–17.0)
MCH: 34.5 pg — ABNORMAL HIGH (ref 26.0–34.0)
MCHC: 34.7 g/dL (ref 30.0–36.0)
MCV: 99.4 fL (ref 78.0–100.0)
Platelets: 92 10*3/uL — ABNORMAL LOW (ref 150–400)
RBC: 3.19 MIL/uL — AB (ref 4.22–5.81)
RDW: 14.8 % (ref 11.5–15.5)
WBC: 4.6 10*3/uL (ref 4.0–10.5)

## 2016-08-05 LAB — CULTURE, BLOOD (ROUTINE X 2)
CULTURE: NO GROWTH
Culture: NO GROWTH

## 2016-08-05 MED ORDER — ACETAMINOPHEN 325 MG PO TABS
650.0000 mg | ORAL_TABLET | Freq: Once | ORAL | Status: AC
Start: 2016-08-05 — End: 2016-08-05
  Administered 2016-08-05: 650 mg via ORAL
  Filled 2016-08-05: qty 2

## 2016-08-05 NOTE — Telephone Encounter (Signed)
error 

## 2016-08-05 NOTE — ED Notes (Signed)
Pt given sandwich meal and water.  

## 2016-08-05 NOTE — ED Triage Notes (Addendum)
Pt comes via [facility], [facility] available, pt c/o of anxiety and tremors, pt was seen at [facility] the other day for the same. Pt states that everything on his body hurts.

## 2016-08-05 NOTE — ED Provider Notes (Signed)
[facility]-EMERGENCY DEPT Provider Note   CSN: [id number] Arrival date & time: 08/05/16  0133   By signing my name below, I, Nelwyn SalisburyJoshua Fowler, attest that this documentation has been prepared under the direction and in the presence of Linwood DibblesJon Antoria Lanza, MD . Electronically Signed: Nelwyn SalisburyJoshua Fowler, Scribe. 08/05/2016. 3:53 AM.   History   Chief Complaint Chief Complaint  Patient presents with  . Anxiety   The history is provided by the patient. No language interpreter was used.    HPI Comments:  Dennis Zhang is a 61 y.o. male with pmhx of anxiety, PTSD, HTN and DM who presents to the Emergency Department complaining of intermittent, unchanged tremors which have been present for a few months. Pt states that he suffered from periods of anxiety that cause him to shake. Pt reports associated chills. He has been seen many times recently in the ED with similar symptoms. Pt denies any fevers or vomiting.    Past Medical History:  Diagnosis Date  . Arthritis   . Chronic pain   . Chronic pain   . 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 Hyperlipidemia   . Hypertension   . Seizures [facility](HCC)     Patient Active Problem List   Diagnosis Date Noted  . Alcohol withdrawal syndrome with complication (HCC)   . Elevated lactic acid level   . Seizure (HCC)   . Leukopenia 05/23/2016  . Lactic acidosis 05/23/2016  . Syncope and collapse 05/22/2016  . Syncope 06/15/2014  . Hypotension 06/15/2014  . Fall   . Arterial hypotension   . Weakness   . Dehydration 06/14/2014  . Protein-calorie malnutrition, severe (HCC) 05/21/2014  . Acute kidney injury (HCC) 05/20/2014  . Orthostatic hypotension 05/20/2014  . Alcohol abuse 05/20/2014  . Transaminitis 05/20/2014  . Thrombocytopenia (HCC) 05/20/2014  . Acute renal failure (HCC) 03/03/2014  . Alcohol intoxication (HCC) 03/03/2014  . Increased anion gap metabolic acidosis 03/03/2014  . Malnutrition of moderate degree (HCC) 03/03/2014  .  Hypokalemia 10/06/2012  . Gram-negative bacteremia 10/06/2012  . Leukocytosis 10/03/2012  . Chronic pancreatitis (HCC) 10/03/2012  . AKI (acute kidney injury) (HCC) 10/03/2012  . Alcoholic hepatitis 10/03/2012  . Hypoglycemia secondary to sulfonylurea 10/03/2012    Past Surgical History:  Procedure Laterality Date  . ABDOMINAL SURGERY    . APPENDECTOMY    . HEMORROIDECTOMY         Home Medications    Prior to Admission medications   Medication Sig Start Date End Date Taking? Authorizing Provider  acetaminophen (TYLENOL) 500 MG tablet Take 500 mg by mouth 3 (three) times daily as needed (for pain.).     Historical Provider, MD  feeding supplement, ENSURE COMPLETE, (ENSURE COMPLETE) LIQD Take 237 mLs by mouth 3 (three) times daily between meals. 06/16/14   Rhetta MuraJai-Gurmukh Samtani, MD  folic acid (FOLVITE) 1 MG tablet Take 1 tablet (1 mg total) by mouth daily. 05/24/16   Albertine GratesFang Xu, MD  gabapentin (NEURONTIN) 300 MG capsule Take 1 capsule (300 mg total) by mouth 3 (three) times daily. Patient taking differently: Take 300 mg by mouth 4 (four) times daily as needed (for nerve pain.).  07/31/12   Ruby Colaatherine Schinlever, PA-C  Hydrocodone-Acetaminophen (VICODIN HP) 10-300 MG TABS Take 1 tablet by mouth every 8 (eight) hours as needed.    Historical Provider, MD  levETIRAcetam (KEPPRA) 500 MG tablet Take 1 tablet (500 mg total) by mouth 2 (two) times daily. 06/13/16   Elson AreasLeslie K Sofia, PA-C  levETIRAcetam (KEPPRA)  500 MG tablet Take 1 tablet (500 mg total) by mouth 2 (two) times daily. Patient not taking: Reported on 08/03/2016 08/02/16   Renne Musca, MD  Magnesium Oxide 420 MG TABS Take 420 mg by mouth daily.     Historical Provider, MD  Naphazoline HCl (CLEAR EYES OP) Place 1 drop into both eyes daily.    Historical Provider, MD  naproxen (NAPROSYN) 375 MG tablet Take 1 tablet (375 mg total) by mouth 2 (two) times daily. 07/26/16   Trixie Dredge, PA-C  pantoprazole (PROTONIX) 40 MG tablet Take 1 tablet (40  mg total) by mouth daily. 05/24/16   Albertine Grates, MD  tamsulosin (FLOMAX) 0.4 MG CAPS capsule Take 1 capsule (0.4 mg total) by mouth daily after breakfast. Patient not taking: Reported on 08/03/2016 05/23/16   Albertine Grates, MD  thiamine 100 MG tablet Take 1 tablet (100 mg total) by mouth daily. Patient not taking: Reported on 08/03/2016 05/24/16   Albertine Grates, MD  traMADol (ULTRAM) 50 MG tablet Take 1 tablet (50 mg total) by mouth every 6 (six) hours as needed for moderate pain. 05/23/16   Albertine Grates, MD    Family History Family History  Problem Relation Age of Onset  . Hypertension Mother   . Migraines Sister   . Heart failure Brother   . Migraines Brother     Social History Social History  Substance Use Topics  . Smoking status: Current Every Day Smoker    Packs/day: 0.50    Years: 30.00    Types: Cigarettes  . Smokeless tobacco: Never Used  . Alcohol use Yes     Comment: occ     Allergies   Ace inhibitors; Aspirin; and Tylenol [acetaminophen]   Review of Systems Review of Systems  Constitutional: Negative for fever.  Gastrointestinal: Negative for vomiting.  Neurological: Positive for tremors.  Psychiatric/Behavioral: The patient is nervous/anxious.   All other systems reviewed and are negative.    Physical Exam Updated Vital Signs BP (!) 139/92   Pulse 95   Temp 98.8 F (37.1 C) (Oral)   Resp 16   SpO2 100%   Physical Exam  Constitutional: No distress.  Elderly  HENT:  Head: Normocephalic and atraumatic.  Right Ear: External ear normal.  Left Ear: External ear normal.  Eyes: Conjunctivae are normal. Right eye exhibits no discharge. Left eye exhibits no discharge. No scleral icterus.  Neck: Neck supple. No tracheal deviation present.  Cardiovascular: Normal rate, regular rhythm and intact distal pulses.   Pulmonary/Chest: Effort normal and breath sounds normal. No stridor. No respiratory distress. He has no wheezes. He has no rales.  Abdominal: Soft. Bowel sounds are normal.  He exhibits no distension. There is no tenderness. There is no rebound and no guarding.  Musculoskeletal: He exhibits no edema or tenderness.  Neurological: He is alert. He has normal strength. No cranial nerve deficit (no facial droop, extraocular movements intact, no slurred speech) or sensory deficit. He exhibits normal muscle tone. He displays no seizure activity. Coordination normal.  Skin: Skin is warm and dry. No rash noted.  Psychiatric: He has a normal mood and affect.  Nursing note and vitals reviewed.    ED Treatments / Results  DIAGNOSTIC STUDIES:  Oxygen Saturation is 95% on RA, adequate by my interpretation.    COORDINATION OF CARE:  3:58 AM Discussed treatment plan with pt at bedside which includes monitoring here in the ED and pt agreed to plan.  Labs (all labs ordered are listed,  but only abnormal results are displayed) Labs Reviewed  CBC - Abnormal; Notable for the following:       Result Value   RBC 3.19 (*)    Hemoglobin 11.0 (*)    HCT 31.7 (*)    MCH 34.5 (*)    Platelets 92 (*)    All other components within normal limits  BASIC METABOLIC PANEL - Abnormal; Notable for the following:    Potassium 3.1 (*)    Chloride 98 (*)    Calcium 8.6 (*)    All other components within normal limits    Procedures Procedures (including critical care time)  Medications Ordered in ED Medications - No data to display   Initial Impression / Assessment and Plan / ED Course  I have reviewed the triage vital signs and the nursing notes.  Pertinent labs & imaging results that were available during my care of the patient were reviewed by me and considered in my medical decision making (see chart for details).   Hgb is stable.  No significant electrolyte abnormalities.  No neurologic findings on exam.  Suspect there may be an anxiety component.  His social situation may be a factor.  At this time there does not appear to be any evidence of an acute emergency medical  condition and the patient appears stable for discharge with appropriate outpatient follow up.   Final Clinical Impressions(s) / ED Diagnoses   Final diagnoses:  Tremor    I personally performed the services described in this documentation, which was scribed in my presence.  The recorded information has been reviewed and is accurate.     Linwood Dibbles, MD 08/05/16 [phone]

## 2016-08-05 NOTE — ED Notes (Addendum)
Pt departed in NAD. Given bus pass.  

## 2016-08-05 NOTE — Discharge Instructions (Signed)
Continue your current medications, [facility]

## 2016-08-09 ENCOUNTER — Encounter (HOSPITAL_COMMUNITY): Payer: Self-pay | Admitting: Emergency Medicine

## 2016-08-09 ENCOUNTER — Emergency Department (HOSPITAL_COMMUNITY)
Admission: EM | Admit: 2016-08-09 | Discharge: 2016-08-10 | Disposition: A | Discharge status: Home / Self Care | Payer: Medicaid Other | Attending: Emergency Medicine | Admitting: Emergency Medicine

## 2016-08-09 DIAGNOSIS — F17210 Nicotine dependence, cigarettes, uncomplicated: Secondary | ICD-10-CM | POA: Insufficient documentation

## 2016-08-09 DIAGNOSIS — S8991XA Unspecified injury of right lower leg, initial encounter: Secondary | ICD-10-CM | POA: Diagnosis present

## 2016-08-09 DIAGNOSIS — Y999 Unspecified external cause status: Secondary | ICD-10-CM | POA: Insufficient documentation

## 2016-08-09 DIAGNOSIS — Y929 Unspecified place or not applicable: Secondary | ICD-10-CM | POA: Diagnosis not present

## 2016-08-09 DIAGNOSIS — R079 Chest pain, unspecified: Secondary | ICD-10-CM | POA: Diagnosis not present

## 2016-08-09 DIAGNOSIS — I10 Essential (primary) hypertension: Secondary | ICD-10-CM | POA: Diagnosis not present

## 2016-08-09 DIAGNOSIS — S8002XA Contusion of left knee, initial encounter: Secondary | ICD-10-CM | POA: Insufficient documentation

## 2016-08-09 DIAGNOSIS — M549 Dorsalgia, unspecified: Secondary | ICD-10-CM | POA: Insufficient documentation

## 2016-08-09 DIAGNOSIS — Z79899 Other long term (current) drug therapy: Secondary | ICD-10-CM | POA: Insufficient documentation

## 2016-08-09 DIAGNOSIS — X58XXXA Exposure to other specified factors, initial encounter: Secondary | ICD-10-CM | POA: Insufficient documentation

## 2016-08-09 DIAGNOSIS — Y939 Activity, unspecified: Secondary | ICD-10-CM | POA: Insufficient documentation

## 2016-08-09 DIAGNOSIS — S8001XA Contusion of right knee, initial encounter: Secondary | ICD-10-CM | POA: Diagnosis not present

## 2016-08-09 DIAGNOSIS — G8929 Other chronic pain: Secondary | ICD-10-CM

## 2016-08-09 NOTE — ED Triage Notes (Signed)
Comes from [facility] stating they are full and he can't lay down outside and sleep on the ground or the lobby because he has chronic pain issues. Pt requested a sprite on arrival. Ambulatory with cane.  A&O x4. Vitals WNL.

## 2016-08-10 ENCOUNTER — Encounter (HOSPITAL_COMMUNITY): Payer: Self-pay | Admitting: Emergency Medicine

## 2016-08-10 MED ORDER — ACETAMINOPHEN 325 MG PO TABS
650.0000 mg | ORAL_TABLET | Freq: Once | ORAL | Status: AC
  Administered 2016-08-10: 650 mg via ORAL
  Filled 2016-08-10: qty 2

## 2016-08-10 NOTE — ED Notes (Signed)
ED Provider at bedside. 

## 2016-08-10 NOTE — Discharge Instructions (Signed)
Please follow up closely with your doctor for further management of your pain. Avoid taking narcotic pain medication on a regular basis as it can severely harm your health.

## 2016-08-10 NOTE — ED Provider Notes (Signed)
[facility]-EMERGENCY DEPT Provider Note   CSN: [id number] Arrival date & time: 08/09/16  2101     History   Chief Complaint Chief Complaint  Patient presents with  . Nowhere to Stay    HPI Dennis Zhang is a 61 y.o. male.  HPI   61 year old male with history of diabetes, chronic back pain, hypertension, seizure, alcohol and drug use presenting requesting for a place to stay. Patient initially came here via ambulance requesting for place to stay. Patient states he had been staying at the [facility] after he left his apartment 2 months ago after a dispute with his roommate.  However, he report that the [facility] is full and he can't sleep out in the lobby due to his chronic pain.  Patient normally walks with a cane. Furthermore patient reports he has been falling a lot and having pain everywhere. States he normally takes his home medication including Vicodin or Tylenol 3. Please note that patient has been seen in the ED for pain-related complaint at least 5 times this month.  Past Medical History:  Diagnosis Date  . Arthritis   . Chronic pain   . Chronic pain   . 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 Hyperlipidemia   . Hypertension   . Seizures [facility](HCC)     Patient Active Problem List   Diagnosis Date Noted  . Alcohol withdrawal syndrome with complication (HCC)   . Elevated lactic acid level   . Seizure (HCC)   . Leukopenia 05/23/2016  . Lactic acidosis 05/23/2016  . Syncope and collapse 05/22/2016  . Syncope 06/15/2014  . Hypotension 06/15/2014  . Fall   . Arterial hypotension   . Weakness   . Dehydration 06/14/2014  . Protein-calorie malnutrition, severe (HCC) 05/21/2014  . Acute kidney injury (HCC) 05/20/2014  . Orthostatic hypotension 05/20/2014  . Alcohol abuse 05/20/2014  . Transaminitis 05/20/2014  . Thrombocytopenia (HCC) 05/20/2014  . Acute renal failure (HCC) 03/03/2014  . Alcohol intoxication (HCC) 03/03/2014  . Increased  anion gap metabolic acidosis 03/03/2014  . Malnutrition of moderate degree (HCC) 03/03/2014  . Hypokalemia 10/06/2012  . Gram-negative bacteremia 10/06/2012  . Leukocytosis 10/03/2012  . Chronic pancreatitis (HCC) 10/03/2012  . AKI (acute kidney injury) (HCC) 10/03/2012  . Alcoholic hepatitis 10/03/2012  . Hypoglycemia secondary to sulfonylurea 10/03/2012    Past Surgical History:  Procedure Laterality Date  . ABDOMINAL SURGERY    . APPENDECTOMY    . HEMORROIDECTOMY         Home Medications    Prior to Admission medications   Medication Sig Start Date End Date Taking? Authorizing Provider  acetaminophen (TYLENOL) 500 MG tablet Take 500 mg by mouth 3 (three) times daily as needed (for pain.).     Historical Provider, MD  feeding supplement, ENSURE COMPLETE, (ENSURE COMPLETE) LIQD Take 237 mLs by mouth 3 (three) times daily between meals. 06/16/14   Rhetta MuraJai-Gurmukh Samtani, MD  folic acid (FOLVITE) 1 MG tablet Take 1 tablet (1 mg total) by mouth daily. 05/24/16   Albertine GratesFang Xu, MD  gabapentin (NEURONTIN) 300 MG capsule Take 1 capsule (300 mg total) by mouth 3 (three) times daily. Patient taking differently: Take 300 mg by mouth 4 (four) times daily as needed (for nerve pain.).  07/31/12   Ruby Colaatherine Schinlever, PA-C  Hydrocodone-Acetaminophen (VICODIN HP) 10-300 MG TABS Take 1 tablet by mouth every 8 (eight) hours as needed.    Historical Provider, MD  levETIRAcetam (KEPPRA) 500 MG tablet Take 1  tablet (500 mg total) by mouth 2 (two) times daily. 06/13/16   Elson Areas, PA-C  levETIRAcetam (KEPPRA) 500 MG tablet Take 1 tablet (500 mg total) by mouth 2 (two) times daily. Patient not taking: Reported on 08/03/2016 08/02/16   Renne Musca, MD  Magnesium Oxide 420 MG TABS Take 420 mg by mouth daily.     Historical Provider, MD  Naphazoline HCl (CLEAR EYES OP) Place 1 drop into both eyes daily.    Historical Provider, MD  naproxen (NAPROSYN) 375 MG tablet Take 1 tablet (375 mg total) by mouth 2  (two) times daily. 07/26/16   Trixie Dredge, PA-C  pantoprazole (PROTONIX) 40 MG tablet Take 1 tablet (40 mg total) by mouth daily. 05/24/16   Albertine Grates, MD  tamsulosin (FLOMAX) 0.4 MG CAPS capsule Take 1 capsule (0.4 mg total) by mouth daily after breakfast. Patient not taking: Reported on 08/03/2016 05/23/16   Albertine Grates, MD  thiamine 100 MG tablet Take 1 tablet (100 mg total) by mouth daily. Patient not taking: Reported on 08/03/2016 05/24/16   Albertine Grates, MD  traMADol (ULTRAM) 50 MG tablet Take 1 tablet (50 mg total) by mouth every 6 (six) hours as needed for moderate pain. 05/23/16   Albertine Grates, MD    Family History Family History  Problem Relation Age of Onset  . Hypertension Mother   . Migraines Sister   . Heart failure Brother   . Migraines Brother     Social History Social History  Substance Use Topics  . Smoking status: Current Every Day Smoker    Packs/day: 0.50    Years: 30.00    Types: Cigarettes  . Smokeless tobacco: Never Used  . Alcohol use Yes     Comment: occ     Allergies   Ace inhibitors; Aspirin; and Tylenol [acetaminophen]   Review of Systems Review of Systems  All other systems reviewed and are negative.    Physical Exam Updated Vital Signs BP 127/77 (BP Location: Left Arm)   Pulse 90   Temp 98.7 F (37.1 C) (Oral)   Resp 19   SpO2 100%   Physical Exam  Constitutional: He appears well-developed and well-nourished. No distress.  Patient sleeping but easily arousable and in no acute discomfort.  HENT:  Head: Atraumatic.  Eyes: Conjunctivae are normal.  Neck: Neck supple.  Cardiovascular: Normal rate and regular rhythm.   Pulmonary/Chest: Effort normal and breath sounds normal. He exhibits tenderness (Tenderness throughout entire chest even on gentle palpation. No crepitus or emphysema.).  Abdominal: Soft. There is no tenderness.  Musculoskeletal: He exhibits tenderness (Tenderness to bilateral lower extremity including bilateral knee, bruising noted to the  anterior knee. Walks with a cane.).  Neurological: He is alert.  Skin: No rash noted.  Psychiatric: He has a normal mood and affect.  Nursing note and vitals reviewed.    ED Treatments / Results  Labs (all labs ordered are listed, but only abnormal results are displayed) Labs Reviewed - No data to display  EKG  EKG Interpretation None       Radiology No results found.  Procedures Procedures (including critical care time)  Medications Ordered in ED Medications - No data to display   Initial Impression / Assessment and Plan / ED Course  I have reviewed the triage vital signs and the nursing notes.  Pertinent labs & imaging results that were available during my care of the patient were reviewed by me and considered in my medical decision making (  see chart for details).     BP 127/77 (BP Location: Left Arm)   Pulse 90   Temp 98.7 F (37.1 C) (Oral)   Resp 19   SpO2 100%    Final Clinical Impressions(s) / ED Diagnoses   Final diagnoses:  Other chronic pain    New Prescriptions New Prescriptions   No medications on file   12:35 AM Pt seen in the ER at least 5-6 times this month for pain related complaint.  His initial request was a place to stay, however afterward he report having aches and pain throughout body from recurrent falls.  I do not suspect acute fx/dislocation.  Tylenol given. Encourage pt to f/u with his PCP for further management.    Fayrene Helper, PA-C 08/10/16 0037    Nira Conn, MD 08/10/16 [phone]

## 2016-08-10 NOTE — ED Notes (Signed)
Patient was alert, oriented and stable upon discharge. RN went over AVS and patient had no further questions.  

## 2016-10-15 ENCOUNTER — Emergency Department (HOSPITAL_COMMUNITY)
Admission: EM | Admit: 2016-10-15 | Discharge: 2016-10-16 | Disposition: A | Discharge status: Home / Self Care | Payer: Non-veteran care | Attending: Emergency Medicine | Admitting: Emergency Medicine

## 2016-10-15 ENCOUNTER — Encounter (HOSPITAL_COMMUNITY): Payer: Self-pay | Admitting: Nurse Practitioner

## 2016-10-15 ENCOUNTER — Emergency Department (HOSPITAL_COMMUNITY): Payer: Non-veteran care

## 2016-10-15 DIAGNOSIS — F17210 Nicotine dependence, cigarettes, uncomplicated: Secondary | ICD-10-CM | POA: Insufficient documentation

## 2016-10-15 DIAGNOSIS — Y999 Unspecified external cause status: Secondary | ICD-10-CM | POA: Diagnosis not present

## 2016-10-15 DIAGNOSIS — Y939 Activity, unspecified: Secondary | ICD-10-CM | POA: Diagnosis not present

## 2016-10-15 DIAGNOSIS — E119 Type 2 diabetes mellitus without complications: Secondary | ICD-10-CM | POA: Diagnosis not present

## 2016-10-15 DIAGNOSIS — Y929 Unspecified place or not applicable: Secondary | ICD-10-CM | POA: Diagnosis not present

## 2016-10-15 DIAGNOSIS — I10 Essential (primary) hypertension: Secondary | ICD-10-CM | POA: Diagnosis not present

## 2016-10-15 DIAGNOSIS — M25551 Pain in right hip: Secondary | ICD-10-CM | POA: Insufficient documentation

## 2016-10-15 DIAGNOSIS — W19XXXA Unspecified fall, initial encounter: Secondary | ICD-10-CM | POA: Insufficient documentation

## 2016-10-15 DIAGNOSIS — S79911A Unspecified injury of right hip, initial encounter: Secondary | ICD-10-CM | POA: Diagnosis present

## 2016-10-15 MED ORDER — HYDROCODONE-ACETAMINOPHEN 5-325 MG PO TABS
2.0000 | ORAL_TABLET | Freq: Once | ORAL | Status: AC
  Administered 2016-10-15: 2 via ORAL
  Filled 2016-10-15: qty 2

## 2016-10-15 NOTE — ED Provider Notes (Signed)
[facility]-EMERGENCY DEPT Provider Note   CSN: [id number] Arrival date & time: 10/15/16  1909     History   Chief Complaint Chief Complaint  Patient presents with  . Tremors  . Medication Refill    HPI Dennis Zhang is a 61 y.o. male.  HPI   Dennis Zhang is a 61 y.o. male, with a history of chronic pain, DM, HTN, and seizures, presenting to the ED with right hip pain following a fall this morning. Patient states he sustains frequent falls, which cause him pain, and in turn make him fall more. He is being treated for chronic pain and is out of his home pain medications.  He also complains of feeling dehydrated. States he hasn't eaten much lately.  Patient did not complain of tremors with me, however, this was part of his triage complaint.      Past Medical History:  Diagnosis Date  . Arthritis   . Chronic pain   . Chronic pain   . 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 Hyperlipidemia   . Hypertension   . Seizures [facility])     Patient Active Problem List   Diagnosis Date Noted  . Alcohol withdrawal syndrome with complication (HCC)   . Elevated lactic acid level   . Seizure (HCC)   . Leukopenia 05/23/2016  . Lactic acidosis 05/23/2016  . Syncope and collapse 05/22/2016  . Syncope 06/15/2014  . Hypotension 06/15/2014  . Fall   . Arterial hypotension   . Weakness   . Dehydration 06/14/2014  . Protein-calorie malnutrition, severe (HCC) 05/21/2014  . Acute kidney injury (HCC) 05/20/2014  . Orthostatic hypotension 05/20/2014  . Alcohol abuse 05/20/2014  . Transaminitis 05/20/2014  . Thrombocytopenia (HCC) 05/20/2014  . Acute renal failure (HCC) 03/03/2014  . Alcohol intoxication (HCC) 03/03/2014  . Increased anion gap metabolic acidosis 03/03/2014  . Malnutrition of moderate degree (HCC) 03/03/2014  . Hypokalemia 10/06/2012  . Gram-negative bacteremia 10/06/2012  . Leukocytosis 10/03/2012  . Chronic pancreatitis (HCC) 10/03/2012  . AKI  (acute kidney injury) (HCC) 10/03/2012  . Alcoholic hepatitis 10/03/2012  . Hypoglycemia secondary to sulfonylurea 10/03/2012    Past Surgical History:  Procedure Laterality Date  . ABDOMINAL SURGERY    . APPENDECTOMY    . HEMORROIDECTOMY         Home Medications    Prior to Admission medications   Medication Sig Start Date End Date Taking? Authorizing Provider  acetaminophen (TYLENOL) 500 MG tablet Take 500 mg by mouth 3 (three) times daily as needed (for pain.).     [provider]  feeding supplement, ENSURE COMPLETE, (ENSURE COMPLETE) LIQD Take 237 mLs by mouth 3 (three) times daily between meals. 06/16/14   Rhetta Mura, MD  folic acid (FOLVITE) 1 MG tablet Take 1 tablet (1 mg total) by mouth daily. 05/24/16   Albertine Grates, MD  gabapentin (NEURONTIN) 300 MG capsule Take 1 capsule (300 mg total) by mouth 3 (three) times daily. Patient taking differently: Take 300 mg by mouth 4 (four) times daily as needed (for nerve pain.).  07/31/12   Schinlever, Santina Evans, PA-C  Hydrocodone-Acetaminophen (VICODIN HP) 10-300 MG TABS Take 1 tablet by mouth every 8 (eight) hours as needed.    [provider]  levETIRAcetam (KEPPRA) 500 MG tablet Take 1 tablet (500 mg total) by mouth 2 (two) times daily. 06/13/16   Elson Areas, PA-C  levETIRAcetam (KEPPRA) 500 MG tablet Take 1 tablet (500 mg total) by mouth 2 (two)  times daily. Patient not taking: Reported on 08/03/2016 08/02/16   Renne Musca, MD  Magnesium Oxide 420 MG TABS Take 420 mg by mouth daily.     [provider]  Naphazoline HCl (CLEAR EYES OP) Place 1 drop into both eyes daily.    [provider]  naproxen (NAPROSYN) 375 MG tablet Take 1 tablet (375 mg total) by mouth 2 (two) times daily. 07/26/16   Trixie Dredge, PA-C  pantoprazole (PROTONIX) 40 MG tablet Take 1 tablet (40 mg total) by mouth daily. 05/24/16   Albertine Grates, MD  tamsulosin (FLOMAX) 0.4 MG CAPS capsule Take 1 capsule (0.4 mg total) by mouth  daily after breakfast. Patient not taking: Reported on 08/03/2016 05/23/16   Albertine Grates, MD  thiamine 100 MG tablet Take 1 tablet (100 mg total) by mouth daily. Patient not taking: Reported on 08/03/2016 05/24/16   Albertine Grates, MD  traMADol (ULTRAM) 50 MG tablet Take 1 tablet (50 mg total) by mouth every 6 (six) hours as needed for moderate pain. 05/23/16   Albertine Grates, MD    Family History Family History  Problem Relation Age of Onset  . Hypertension Mother   . Migraines Sister   . Heart failure Brother   . Migraines Brother     Social History Social History  Substance Use Topics  . Smoking status: Current Every Day Smoker    Packs/day: 0.50    Years: 30.00    Types: Cigarettes  . Smokeless tobacco: Never Used  . Alcohol use Yes     Comment: occ     Allergies   Ace inhibitors; Aspirin; and Tylenol [acetaminophen]   Review of Systems Review of Systems  Constitutional: Negative for chills and fever.  Respiratory: Negative for shortness of breath.   Cardiovascular: Negative for chest pain.  Gastrointestinal: Negative for abdominal pain, nausea and vomiting.  Musculoskeletal: Positive for arthralgias.  Neurological: Negative for weakness and numbness.  All other systems reviewed and are negative.    Physical Exam Updated Vital Signs BP (!) 92/57 (BP Location: Left Arm)   Pulse 100   Temp 99.5 F (37.5 C) (Oral)   Resp 18   SpO2 97%   Physical Exam  Constitutional: He appears well-developed and well-nourished. No distress.  HENT:  Head: Normocephalic and atraumatic.  Eyes: Conjunctivae are normal.  Neck: Neck supple.  Cardiovascular: Normal rate, regular rhythm, normal heart sounds and intact distal pulses.   Pulmonary/Chest: Effort normal and breath sounds normal. No respiratory distress.  Abdominal: Soft. There is no tenderness. There is no guarding.  Musculoskeletal: He exhibits tenderness. He exhibits no edema.  Tenderness to the right lateral hip without noted  deformity, crepitus, or swelling. Painful range of motion. Range of motion intact to the knee and ankle.  Lymphadenopathy:    He has no cervical adenopathy.  Neurological: He is alert.  Strength 5/5 in bilateral lower extremities bilaterally. No acute sensory deficits.  Skin: Skin is warm and dry. He is not diaphoretic.  Psychiatric: He has a normal mood and affect. His behavior is normal.  Nursing note and vitals reviewed.    ED Treatments / Results  Labs (all labs ordered are listed, but only abnormal results are displayed) Labs Reviewed - No data to display  EKG  EKG Interpretation None       Radiology Dg Hip Unilat W Or Wo Pelvis 2-3 Views Right  Result Date: 10/15/2016 CLINICAL DATA:  Seizures today, her RIGHT hip during the fall, history of  bad arthritis, diabetes mellitus, hypertension EXAM: DG HIP (WITH OR WITHOUT PELVIS) 2-3V RIGHT COMPARISON:  03/03/2014 FINDINGS: Mild osseous demineralization. SI joint spaces symmetric and preserved. BILATERAL hip joint space narrowing with spur formation and subchondral cystic changes greater on LEFT. No acute fracture, dislocation, or bone destruction. Scattered atherosclerotic calcifications. IMPRESSION: Osteoarthritic changes of both hip joints, greater on LEFT. No acute bony abnormalities. Electronically Signed   By: Ulyses SouthwardMark  Boles M.D.   On: 10/15/2016 22:54    Procedures Procedures (including critical care time)  Medications Ordered in ED Medications  HYDROcodone-acetaminophen (NORCO/VICODIN) 5-325 MG per tablet 2 tablet (not administered)     Initial Impression / Assessment and Plan / ED Course  I have reviewed the triage vital signs and the nursing notes.  Pertinent labs & imaging results that were available during my care of the patient were reviewed by me and considered in my medical decision making (see chart for details).     Patient presents with right hip pain following a fall this morning. Patient has good range of  motion, although painful. No acute abnormalities on x-ray. Weight bearing and ambulation intact. Orthopedic follow-up as needed. Pain management follow-up. The patient was given instructions for home care as well as return precautions. Patient voices understanding of these instructions, accepts the plan, and is comfortable with discharge.    Vitals:   10/15/16 1917 10/15/16 2213  BP: (!) 92/57 131/83  Pulse: 100 99  Resp: 18 18  Temp: 99.5 F (37.5 C) 99.2 F (37.3 C)  TempSrc: Oral Oral  SpO2: 97% 97%      Final Clinical Impressions(s) / ED Diagnoses   Final diagnoses:  Fall, initial encounter  Pain of right hip joint    New Prescriptions New Prescriptions   No medications on file     Concepcion LivingJoy, Shawn C, PA-C 10/15/16 2313    Anselm PancoastJoy, Shawn C, PA-C 10/15/16 2339    Linwood DibblesKnapp, Jon, MD 10/16/16 1500

## 2016-10-15 NOTE — ED Triage Notes (Signed)
Pt has been brought it in by EMS with c/o tremors, also requesting medication refill for his chronic pain medication.

## 2016-10-15 NOTE — Discharge Instructions (Signed)
There were no acute abnormalities noted on the hip x-ray. Follow-up with your primary care provider and pain management on this matter. Follow up with orthopedics should symptoms fail to improve. Return to the ED as needed.

## 2016-10-16 NOTE — ED Notes (Signed)
Pt. Ambulated down the hall and back to his bed with cane without difficulty.Pt. Gait steady on his feet.

## 2017-06-03 ENCOUNTER — Emergency Department (HOSPITAL_COMMUNITY)
Admission: EM | Admit: 2017-06-03 | Discharge: 2017-06-03 | Disposition: A | Discharge status: Home / Self Care | Payer: Non-veteran care | Attending: Emergency Medicine | Admitting: Emergency Medicine

## 2017-06-03 ENCOUNTER — Emergency Department (HOSPITAL_COMMUNITY): Payer: Non-veteran care

## 2017-06-03 ENCOUNTER — Encounter (HOSPITAL_COMMUNITY): Payer: Self-pay | Admitting: Emergency Medicine

## 2017-06-03 ENCOUNTER — Other Ambulatory Visit: Payer: Self-pay

## 2017-06-03 DIAGNOSIS — Z79899 Other long term (current) drug therapy: Secondary | ICD-10-CM | POA: Diagnosis not present

## 2017-06-03 DIAGNOSIS — G8929 Other chronic pain: Secondary | ICD-10-CM | POA: Insufficient documentation

## 2017-06-03 DIAGNOSIS — R1110 Vomiting, unspecified: Secondary | ICD-10-CM | POA: Diagnosis not present

## 2017-06-03 DIAGNOSIS — R079 Chest pain, unspecified: Secondary | ICD-10-CM | POA: Diagnosis present

## 2017-06-03 DIAGNOSIS — R072 Precordial pain: Secondary | ICD-10-CM | POA: Diagnosis not present

## 2017-06-03 DIAGNOSIS — R197 Diarrhea, unspecified: Secondary | ICD-10-CM | POA: Diagnosis not present

## 2017-06-03 DIAGNOSIS — K861 Other chronic pancreatitis: Secondary | ICD-10-CM | POA: Diagnosis not present

## 2017-06-03 DIAGNOSIS — Z886 Allergy status to analgesic agent status: Secondary | ICD-10-CM | POA: Insufficient documentation

## 2017-06-03 DIAGNOSIS — F17210 Nicotine dependence, cigarettes, uncomplicated: Secondary | ICD-10-CM | POA: Insufficient documentation

## 2017-06-03 DIAGNOSIS — E119 Type 2 diabetes mellitus without complications: Secondary | ICD-10-CM | POA: Insufficient documentation

## 2017-06-03 DIAGNOSIS — I10 Essential (primary) hypertension: Secondary | ICD-10-CM | POA: Diagnosis not present

## 2017-06-03 LAB — CBC
HEMATOCRIT: 30.9 % — AB (ref 39.0–52.0)
Hemoglobin: 10.9 g/dL — ABNORMAL LOW (ref 13.0–17.0)
MCH: 35.5 pg — ABNORMAL HIGH (ref 26.0–34.0)
MCHC: 35.3 g/dL (ref 30.0–36.0)
MCV: 100.7 fL — ABNORMAL HIGH (ref 78.0–100.0)
Platelets: 74 10*3/uL — ABNORMAL LOW (ref 150–400)
RBC: 3.07 MIL/uL — ABNORMAL LOW (ref 4.22–5.81)
RDW: 14.3 % (ref 11.5–15.5)
WBC: 4.3 10*3/uL (ref 4.0–10.5)

## 2017-06-03 LAB — I-STAT TROPONIN, ED
Troponin i, poc: 0 ng/mL (ref 0.00–0.08)
Troponin i, poc: 0 ng/mL (ref 0.00–0.08)

## 2017-06-03 LAB — BASIC METABOLIC PANEL
Anion gap: 8 (ref 5–15)
BUN: 6 mg/dL (ref 6–20)
CALCIUM: 8.4 mg/dL — AB (ref 8.9–10.3)
CO2: 27 mmol/L (ref 22–32)
Chloride: 101 mmol/L (ref 101–111)
Creatinine, Ser: 1.16 mg/dL (ref 0.61–1.24)
GFR calc Af Amer: >60 mL/min (ref >60)
GLUCOSE: 109 mg/dL — AB (ref 65–99)
POTASSIUM: 3.7 mmol/L (ref 3.5–5.1)
Sodium: 136 mmol/L (ref 135–145)

## 2017-06-03 MED ORDER — HYDROCODONE-ACETAMINOPHEN 5-325 MG PO TABS
1.0000 | ORAL_TABLET | Freq: Once | ORAL | Status: AC
  Administered 2017-06-03: 1 via ORAL
  Filled 2017-06-03: qty 1

## 2017-06-03 MED ORDER — SODIUM CHLORIDE 0.9 % IV BOLUS (SEPSIS)
1000.0000 mL | Freq: Once | INTRAVENOUS | Status: AC
  Administered 2017-06-03: 1000 mL via INTRAVENOUS

## 2017-06-03 NOTE — ED Notes (Signed)
PT states understanding of care given, follow up care. PT ambulated from ED to car with a steady gait.  

## 2017-06-03 NOTE — Discharge Instructions (Signed)

## 2017-06-03 NOTE — ED Notes (Signed)
Attempted ivx2 unsuccessful 

## 2017-06-03 NOTE — ED Provider Notes (Signed)
[facility] EMERGENCY DEPARTMENT Provider Note   CSN: [id number] Arrival date & time: 06/03/17  0234     History   Chief Complaint Chief Complaint  Patient presents with  . Chest Pain    HPI Dennis Zhang is a 62 y.o. male.  The history is provided by the patient.  Chest Pain   This is a new problem. The current episode started more than 2 days ago. The problem occurs daily. The problem has been gradually worsening. Pain location: left chest  The pain is moderate. The pain does not radiate. Associated symptoms include shortness of breath and vomiting. Pertinent negatives include no fever. He has tried nothing for the symptoms.  Patient with history of chronic pain, arthritis, diabetes, seizures presents with chest pain Patient reported to me he has been having intermittent episodes of chest pain in his left chest for the past 4 days, reports it seems to be worse when he is getting up off the bed. He reports it also is worse with palpation of his chest Reports shortness of breath He also mentions vomiting and diarrhea for several weeks has been unchanged He denies any recent trauma He denies any known history of CAD  Past Medical History:  Diagnosis Date  . Arthritis   . Chronic pain   . Chronic pain   . 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 Hyperlipidemia   . Hypertension   . Seizures [facility])     Patient Active Problem List   Diagnosis Date Noted  . Alcohol withdrawal syndrome with complication (HCC)   . Elevated lactic acid level   . Seizure (HCC)   . Leukopenia 05/23/2016  . Lactic acidosis 05/23/2016  . Syncope and collapse 05/22/2016  . Syncope 06/15/2014  . Hypotension 06/15/2014  . Fall   . Arterial hypotension   . Weakness   . Dehydration 06/14/2014  . Protein-calorie malnutrition, severe (HCC) 05/21/2014  . Acute kidney injury (HCC) 05/20/2014  . Orthostatic hypotension 05/20/2014  . Alcohol abuse 05/20/2014    . Transaminitis 05/20/2014  . Thrombocytopenia (HCC) 05/20/2014  . Acute renal failure (HCC) 03/03/2014  . Alcohol intoxication (HCC) 03/03/2014  . Increased anion gap metabolic acidosis 03/03/2014  . Malnutrition of moderate degree (HCC) 03/03/2014  . Hypokalemia 10/06/2012  . Gram-negative bacteremia 10/06/2012  . Leukocytosis 10/03/2012  . Chronic pancreatitis (HCC) 10/03/2012  . AKI (acute kidney injury) (HCC) 10/03/2012  . Alcoholic hepatitis 10/03/2012  . Hypoglycemia secondary to sulfonylurea 10/03/2012    Past Surgical History:  Procedure Laterality Date  . ABDOMINAL SURGERY    . APPENDECTOMY    . HEMORROIDECTOMY         Home Medications    Prior to Admission medications   Medication Sig Start Date End Date Taking? Authorizing Provider  acetaminophen (TYLENOL) 500 MG tablet Take 500 mg by mouth 3 (three) times daily as needed (for pain.).     [provider]  feeding supplement, ENSURE COMPLETE, (ENSURE COMPLETE) LIQD Take 237 mLs by mouth 3 (three) times daily between meals. 06/16/14   Rhetta Mura, MD  folic acid (FOLVITE) 1 MG tablet Take 1 tablet (1 mg total) by mouth daily. 05/24/16   Albertine Grates, MD  gabapentin (NEURONTIN) 300 MG capsule Take 1 capsule (300 mg total) by mouth 3 (three) times daily. Patient taking differently: Take 300 mg by mouth 4 (four) times daily as needed (for nerve pain.).  07/31/12   Schinlever, Santina Evans, PA-C  Hydrocodone-Acetaminophen (VICODIN  HP) 10-300 MG TABS Take 1 tablet by mouth every 8 (eight) hours as needed.    [provider]  levETIRAcetam (KEPPRA) 500 MG tablet Take 1 tablet (500 mg total) by mouth 2 (two) times daily. 06/13/16   Elson AreasSofia, Leslie K, PA-C  levETIRAcetam (KEPPRA) 500 MG tablet Take 1 tablet (500 mg total) by mouth 2 (two) times daily. Patient not taking: Reported on 08/03/2016 08/02/16   Renne MuscaWarden, Daniel L, MD  Magnesium Oxide 420 MG TABS Take 420 mg by mouth daily.     [provider]   Naphazoline HCl (CLEAR EYES OP) Place 1 drop into both eyes daily.    [provider]  naproxen (NAPROSYN) 375 MG tablet Take 1 tablet (375 mg total) by mouth 2 (two) times daily. 07/26/16   Trixie DredgeWest, Emily, PA-C  pantoprazole (PROTONIX) 40 MG tablet Take 1 tablet (40 mg total) by mouth daily. 05/24/16   Albertine GratesXu, Fang, MD  tamsulosin (FLOMAX) 0.4 MG CAPS capsule Take 1 capsule (0.4 mg total) by mouth daily after breakfast. Patient not taking: Reported on 08/03/2016 05/23/16   Albertine GratesXu, Fang, MD  thiamine 100 MG tablet Take 1 tablet (100 mg total) by mouth daily. Patient not taking: Reported on 08/03/2016 05/24/16   Albertine GratesXu, Fang, MD  traMADol (ULTRAM) 50 MG tablet Take 1 tablet (50 mg total) by mouth every 6 (six) hours as needed for moderate pain. 05/23/16   Albertine GratesXu, Fang, MD    Family History Family History  Problem Relation Age of Onset  . Hypertension Mother   . Migraines Sister   . Heart failure Brother   . Migraines Brother     Social History Social History   Tobacco Use  . Smoking status: Current Every Day Smoker    Packs/day: 0.50    Years: 30.00    Pack years: 15.00    Types: Cigarettes  . Smokeless tobacco: Never Used  Substance Use Topics  . Alcohol use: Yes    Comment: occ  . Drug use: No     Allergies   Ace inhibitors; Aspirin; and Tylenol [acetaminophen]   Review of Systems Review of Systems  Constitutional: Negative for fever.  Respiratory: Positive for shortness of breath.   Cardiovascular: Positive for chest pain.  Gastrointestinal: Positive for diarrhea and vomiting.  All other systems reviewed and are negative.    Physical Exam Updated Vital Signs BP (!) 139/94   Pulse 90   Temp 98.6 F (37 C) (Oral)   Resp 11   Ht 1.88 m (6\' 2" )   Wt 72.1 kg (159 lb)   SpO2 99%   BMI 20.41 kg/m   Physical Exam CONSTITUTIONAL: Disheveled, no acute distress HEAD: Normocephalic/atraumatic EYES: EOMI/PERRL ENMT: Mucous membranes moist NECK: supple no meningeal  signs SPINE/BACK:entire spine nontender CV: S1/S2 noted, no murmurs/rubs/gallops noted LUNGS: Lungs are clear to auscultation bilaterally, no apparent distress Chest -mild tenderness palpation of left chest, no crepitus or bruising ABDOMEN: soft, nontender, no rebound or guarding, bowel sounds noted throughout abdomen GU:no cva tenderness NEURO: Pt is awake/alert/appropriate, moves all extremitiesx4.  No facial droop.   EXTREMITIES: pulses normal/equal, full ROM, no calf tenderness or edema SKIN: warm, color normal PSYCH: no abnormalities of mood noted, alert and oriented to situation   ED Treatments / Results  Labs (all labs ordered are listed, but only abnormal results are displayed) Labs Reviewed  BASIC METABOLIC PANEL - Abnormal; Notable for the following components:      Result Value   Glucose, Bld 109 (*)  Calcium 8.4 (*)    All other components within normal limits  CBC - Abnormal; Notable for the following components:   RBC 3.07 (*)    Hemoglobin 10.9 (*)    HCT 30.9 (*)    MCV 100.7 (*)    MCH 35.5 (*)    Platelets 74 (*)    All other components within normal limits  I-STAT TROPONIN, ED  I-STAT TROPONIN, ED    EKG  EKG Interpretation  Date/Time:  Sunday June 03 2017 02:45:51 EST Ventricular Rate:  97 PR Interval:    QRS Duration: 93 QT Interval:  400 QTC Calculation: 509 R Axis:   -102 Text Interpretation:  Sinus rhythm Probable left atrial enlargement Consider inferior infarct Anterolateral infarct, old Interpretation limited secondary to artifact No significant change since last tracing Confirmed by Zadie Rhine ([id number]) on 06/03/2017 2:53:06 AM       EKG Interpretation  Date/Time:  Sunday June 03 2017 06:01:17 EST Ventricular Rate:  103 PR Interval:    QRS Duration: 86 QT Interval:  369 QTC Calculation: 483 R Axis:   -107 Text Interpretation:  Sinus tachycardia Probable left atrial enlargement Consider inferior infarct Anterior infarct,  old Baseline wander in lead(s) V2 No significant change since last tracing Confirmed by Zadie Rhine ([id number]) on 06/03/2017 6:07:41 AM       Radiology Dg Chest 2 View  Result Date: 06/03/2017 CLINICAL DATA:  Chest pain for 2 weeks EXAM: CHEST  2 VIEW COMPARISON:  07/31/2016 FINDINGS: Hyperinflation. Mild bronchitic changes, chronic. No consolidation or effusion. Stable cardiomediastinal silhouette. No pneumothorax. Degenerative changes of the spine. IMPRESSION: No active cardiopulmonary disease. Electronically Signed   By: Jasmine Pang M.D.   On: 06/03/2017 03:30    Procedures Procedures   Medications Ordered in ED Medications  sodium chloride 0.9 % bolus 1,000 mL (1,000 mLs Intravenous New Bag/Given 06/03/17 0447)  HYDROcodone-acetaminophen (NORCO/VICODIN) 5-325 MG per tablet 1 tablet (1 tablet Oral Given 06/03/17 0447)     Initial Impression / Assessment and Plan / ED Course  I have reviewed the triage vital signs and the nursing notes.  Pertinent labs & imaging results that were available during my care of the patient were reviewed by me and considered in my medical decision making (see chart for details).     3:52 AM Patient is a poor historian, here for multiple complaints He told me he had chest pain for 4 days, told the nurse he had a for 2 weeks He also mentions that has been having vomiting/diarrhea for weeks At this point he is well-appearing, no acute distress Initial workup thus far has been unremarkable Plan to monitor and repeat troponin/EKG 6:10 AM Patient monitored for several hours No hypoxia noted, no distress, patient was sleeping most of the time Repeat EKG is unchanged, repeat troponin is negative Given history and physical, my suspicion for ACS/PE/dissection or any other acute cardiopulmonary emergency is low Patient to be discharged home Final Clinical Impressions(s) / ED Diagnoses   Final diagnoses:  Precordial pain  Vomiting and diarrhea    ED  Discharge Orders    None       Zadie Rhine, MD 06/03/17 [phone]

## 2017-06-03 NOTE — ED Triage Notes (Signed)
Pt brought to ED by [facility] from home after waking up with 9/10 L side cp pt states he is been having this cp in and out for the past 2 weeks. 324 mg ASA given by EMS pta.  BP 138/88 HR72 regular R- 16 SPO2 98% RA

## 2017-07-24 ENCOUNTER — Other Ambulatory Visit: Payer: Self-pay

## 2017-07-24 ENCOUNTER — Inpatient Hospital Stay (HOSPITAL_COMMUNITY)
Admission: EM | Admit: 2017-07-24 | Discharge: 2017-07-28 | DRG: 194 | Discharge status: Against Medical Advice | Payer: Non-veteran care | Attending: Family Medicine | Admitting: Family Medicine

## 2017-07-24 ENCOUNTER — Emergency Department (HOSPITAL_COMMUNITY): Payer: Non-veteran care

## 2017-07-24 ENCOUNTER — Encounter (HOSPITAL_COMMUNITY): Payer: Self-pay | Admitting: Emergency Medicine

## 2017-07-24 DIAGNOSIS — J181 Lobar pneumonia, unspecified organism: Principal | ICD-10-CM | POA: Diagnosis present

## 2017-07-24 DIAGNOSIS — Z8249 Family history of ischemic heart disease and other diseases of the circulatory system: Secondary | ICD-10-CM

## 2017-07-24 DIAGNOSIS — R0602 Shortness of breath: Secondary | ICD-10-CM | POA: Diagnosis not present

## 2017-07-24 DIAGNOSIS — D539 Nutritional anemia, unspecified: Secondary | ICD-10-CM | POA: Diagnosis present

## 2017-07-24 DIAGNOSIS — I952 Hypotension due to drugs: Secondary | ICD-10-CM | POA: Diagnosis present

## 2017-07-24 DIAGNOSIS — J9383 Other pneumothorax: Secondary | ICD-10-CM

## 2017-07-24 DIAGNOSIS — F1010 Alcohol abuse, uncomplicated: Secondary | ICD-10-CM | POA: Diagnosis present

## 2017-07-24 DIAGNOSIS — G40909 Epilepsy, unspecified, not intractable, without status epilepticus: Secondary | ICD-10-CM | POA: Diagnosis present

## 2017-07-24 DIAGNOSIS — F1020 Alcohol dependence, uncomplicated: Secondary | ICD-10-CM | POA: Diagnosis present

## 2017-07-24 DIAGNOSIS — J9312 Secondary spontaneous pneumothorax: Secondary | ICD-10-CM | POA: Diagnosis present

## 2017-07-24 DIAGNOSIS — J939 Pneumothorax, unspecified: Secondary | ICD-10-CM

## 2017-07-24 DIAGNOSIS — I10 Essential (primary) hypertension: Secondary | ICD-10-CM | POA: Diagnosis present

## 2017-07-24 DIAGNOSIS — R296 Repeated falls: Secondary | ICD-10-CM | POA: Diagnosis present

## 2017-07-24 DIAGNOSIS — I959 Hypotension, unspecified: Secondary | ICD-10-CM | POA: Diagnosis present

## 2017-07-24 DIAGNOSIS — K861 Other chronic pancreatitis: Secondary | ICD-10-CM | POA: Diagnosis present

## 2017-07-24 DIAGNOSIS — T464X1A Poisoning by angiotensin-converting-enzyme inhibitors, accidental (unintentional), initial encounter: Secondary | ICD-10-CM | POA: Diagnosis present

## 2017-07-24 DIAGNOSIS — E119 Type 2 diabetes mellitus without complications: Secondary | ICD-10-CM | POA: Diagnosis present

## 2017-07-24 DIAGNOSIS — F17210 Nicotine dependence, cigarettes, uncomplicated: Secondary | ICD-10-CM | POA: Diagnosis present

## 2017-07-24 DIAGNOSIS — E785 Hyperlipidemia, unspecified: Secondary | ICD-10-CM | POA: Diagnosis present

## 2017-07-24 DIAGNOSIS — N179 Acute kidney failure, unspecified: Secondary | ICD-10-CM | POA: Diagnosis present

## 2017-07-24 DIAGNOSIS — R569 Unspecified convulsions: Secondary | ICD-10-CM

## 2017-07-24 DIAGNOSIS — D696 Thrombocytopenia, unspecified: Secondary | ICD-10-CM | POA: Diagnosis present

## 2017-07-24 DIAGNOSIS — J9601 Acute respiratory failure with hypoxia: Secondary | ICD-10-CM

## 2017-07-24 DIAGNOSIS — D6959 Other secondary thrombocytopenia: Secondary | ICD-10-CM | POA: Diagnosis present

## 2017-07-24 LAB — ETHANOL: Alcohol, Ethyl (B): <10 mg/dL (ref <10)

## 2017-07-24 LAB — COMPREHENSIVE METABOLIC PANEL
ALBUMIN: 2.7 g/dL — AB (ref 3.5–5.0)
ALK PHOS: 124 U/L (ref 38–126)
ALT: 14 U/L — ABNORMAL LOW (ref 17–63)
ANION GAP: 8 (ref 5–15)
AST: 63 U/L — AB (ref 15–41)
BILIRUBIN TOTAL: 1.7 mg/dL — AB (ref 0.3–1.2)
BUN: 12 mg/dL (ref 6–20)
CALCIUM: 8.2 mg/dL — AB (ref 8.9–10.3)
CO2: 24 mmol/L (ref 22–32)
Chloride: 102 mmol/L (ref 101–111)
Creatinine, Ser: 1.75 mg/dL — ABNORMAL HIGH (ref 0.61–1.24)
GFR calc Af Amer: 46 mL/min — ABNORMAL LOW (ref >60)
GFR calc non Af Amer: 40 mL/min — ABNORMAL LOW (ref >60)
GLUCOSE: 101 mg/dL — AB (ref 65–99)
Potassium: 4.9 mmol/L (ref 3.5–5.1)
SODIUM: 134 mmol/L — AB (ref 135–145)
TOTAL PROTEIN: 7.6 g/dL (ref 6.5–8.1)

## 2017-07-24 LAB — I-STAT CHEM 8, ED
BUN: 10 mg/dL (ref 6–20)
CALCIUM ION: 0.8 mmol/L — AB (ref 1.15–1.40)
CHLORIDE: 108 mmol/L (ref 101–111)
CREATININE: 1.9 mg/dL — AB (ref 0.61–1.24)
Glucose, Bld: 95 mg/dL (ref 65–99)
HEMATOCRIT: 42 % (ref 39.0–52.0)
Hemoglobin: 14.3 g/dL (ref 13.0–17.0)
Potassium: 4.2 mmol/L (ref 3.5–5.1)
Sodium: 135 mmol/L (ref 135–145)
TCO2: 17 mmol/L — AB (ref 22–32)

## 2017-07-24 LAB — CBC
HCT: 36.5 % — ABNORMAL LOW (ref 39.0–52.0)
Hemoglobin: 12.8 g/dL — ABNORMAL LOW (ref 13.0–17.0)
MCH: 36.5 pg — ABNORMAL HIGH (ref 26.0–34.0)
MCHC: 35.1 g/dL (ref 30.0–36.0)
MCV: 104 fL — ABNORMAL HIGH (ref 78.0–100.0)
PLATELETS: 140 10*3/uL — AB (ref 150–400)
RBC: 3.51 MIL/uL — AB (ref 4.22–5.81)
RDW: 14.8 % (ref 11.5–15.5)
WBC: 4.6 10*3/uL (ref 4.0–10.5)

## 2017-07-24 LAB — URINALYSIS, ROUTINE W REFLEX MICROSCOPIC
BILIRUBIN URINE: NEGATIVE
GLUCOSE, UA: NEGATIVE mg/dL
HGB URINE DIPSTICK: NEGATIVE
Ketones, ur: NEGATIVE mg/dL
Leukocytes, UA: NEGATIVE
Nitrite: NEGATIVE
PROTEIN: NEGATIVE mg/dL
Specific Gravity, Urine: 1.014 (ref 1.005–1.030)
pH: 5 (ref 5.0–8.0)

## 2017-07-24 LAB — RAPID URINE DRUG SCREEN, HOSP PERFORMED
Amphetamines: NOT DETECTED
Barbiturates: NOT DETECTED
Benzodiazepines: NOT DETECTED
Cocaine: NOT DETECTED
OPIATES: NOT DETECTED
Tetrahydrocannabinol: NOT DETECTED

## 2017-07-24 LAB — I-STAT TROPONIN, ED: Troponin i, poc: 0.01 ng/mL (ref 0.00–0.08)

## 2017-07-24 LAB — I-STAT CG4 LACTIC ACID, ED
LACTIC ACID, VENOUS: 2.79 mmol/L — AB (ref 0.5–1.9)
Lactic Acid, Venous: 1.77 mmol/L (ref 0.5–1.9)

## 2017-07-24 MED ORDER — LIDOCAINE-EPINEPHRINE (PF) 2 %-1:200000 IJ SOLN
10.0000 mL | Freq: Once | INTRAMUSCULAR | Status: AC
  Administered 2017-07-24: 10 mL

## 2017-07-24 MED ORDER — LIDOCAINE-EPINEPHRINE (PF) 2 %-1:200000 IJ SOLN
INTRAMUSCULAR | Status: AC
  Administered 2017-07-24
  Filled 2017-07-24: qty 20

## 2017-07-24 MED ORDER — SODIUM CHLORIDE 0.9 % IV BOLUS (SEPSIS)
1000.0000 mL | Freq: Once | INTRAVENOUS | Status: AC
  Administered 2017-07-24: 1000 mL via INTRAVENOUS

## 2017-07-24 MED ORDER — MORPHINE SULFATE (PF) 4 MG/ML IV SOLN
4.0000 mg | Freq: Once | INTRAVENOUS | Status: AC
  Administered 2017-07-24: 4 mg via INTRAVENOUS
  Filled 2017-07-24: qty 1

## 2017-07-24 NOTE — ED Triage Notes (Signed)
Pt BIB EMS from home with complaints of dizziness. Patient stated dizziness is worse sitting and standing. Patient has fallen x2. BP 60 palpated per EMS. CBG 197.

## 2017-07-24 NOTE — ED Notes (Signed)
Pt b/p 85/49 informed the nurse

## 2017-07-24 NOTE — ED Provider Notes (Signed)
[facility] DEPT Provider Note   CSN: [id number] Arrival date & time: 07/24/17  2000     History   Chief Complaint Chief Complaint  Patient presents with  . Hypotension    HPI Dennis Zhang is a 62 y.o. male.  62 year old male with prior history of chronic pain, DM, HTN, Seizures, and alcohol abuse presents for evaluation of reported dizziness and lightheadedness. Patient reports feeling weak and dizzy for 2 days. He denies fever or chest pain. He reports mild cough. He denies nausea or vomiting. He drinks alcohol on a regular basis - his last drink was this morning around 11AM.   Patient admits that he thought that his "blood pressure was high" earlier today - so he took an extra dose of lisinopril this afternoon prior to coming to the ED.   He denies shortness of breath. He denies recent significant trauma.    The history is provided by the patient.  Dizziness  Quality:  Lightheadedness Severity:  Moderate Onset quality:  Gradual Duration:  2 days Timing:  Constant Progression:  Waxing and waning Chronicity:  New Context: standing up   Relieved by:  Nothing Worsened by:  Nothing Ineffective treatments:  None tried Associated symptoms: no chest pain and no shortness of breath     Past Medical History:  Diagnosis Date  . Arthritis   . Chronic pain   . Chronic pain   . 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 Hyperlipidemia   . Hypertension   . Seizures [facility])     Patient Active Problem List   Diagnosis Date Noted  . Alcohol withdrawal syndrome with complication ([place])   . Elevated lactic acid level   . Seizure ([place])   . Leukopenia 05/23/2016  . Lactic acidosis 05/23/2016  . Syncope and collapse 05/22/2016  . Syncope 06/15/2014  . Hypotension 06/15/2014  . Fall   . Arterial hypotension   . Weakness   . Dehydration 06/14/2014  . Protein-calorie malnutrition, severe ([place]) 05/21/2014  . Acute kidney  injury ([place]) 05/20/2014  . Orthostatic hypotension 05/20/2014  . Alcohol abuse 05/20/2014  . Transaminitis 05/20/2014  . Thrombocytopenia ([place]) 05/20/2014  . Acute renal failure ([place]) 03/03/2014  . Alcohol intoxication ([place]) 03/03/2014  . Increased anion gap metabolic acidosis 30/01/2329  . Malnutrition of moderate degree ([place]) 03/03/2014  . Hypokalemia 10/06/2012  . Gram-negative bacteremia 10/06/2012  . Leukocytosis 10/03/2012  . Chronic pancreatitis ([place]) 10/03/2012  . AKI (acute kidney injury) ([place]) 10/03/2012  . Alcoholic hepatitis 07/62/2633  . Hypoglycemia secondary to sulfonylurea 10/03/2012    Past Surgical History:  Procedure Laterality Date  . ABDOMINAL SURGERY    . APPENDECTOMY    . HEMORROIDECTOMY         Home Medications    Prior to Admission medications   Medication Sig Start Date End Date Taking? Authorizing Provider  folic acid (FOLVITE) 1 MG tablet Take 1 tablet (1 mg total) by mouth daily. 05/24/16  Yes Florencia Reasons, MD  gabapentin (NEURONTIN) 300 MG capsule Take 1 capsule (300 mg total) by mouth 3 (three) times daily. Patient taking differently: Take 300 mg by mouth 4 (four) times daily as needed (for nerve pain.).  07/31/12  Yes Schinlever, Barnetta Chapel, PA-C  lisinopril (PRINIVIL,ZESTRIL) 10 MG tablet Take 5 mg by mouth 2 (two) times daily.   Yes [provider]  Magnesium Oxide 420 MG TABS Take 420 mg by mouth daily.    Yes [provider]  Naphazoline HCl (CLEAR EYES OP) Place 1 drop into both eyes daily.   Yes [provider]  oxyCODONE-acetaminophen (PERCOCET) 10-325 MG tablet Take 1 tablet by mouth every 6 (six) hours as needed for pain.   Yes [provider]  acetaminophen (TYLENOL) 500 MG tablet Take 500 mg by mouth 3 (three) times daily as needed (for pain.).     [provider]  feeding supplement, ENSURE COMPLETE, (ENSURE COMPLETE) LIQD Take 237 mLs by mouth 3 (three) times daily between meals. 06/16/14    Nita Sells, MD  levETIRAcetam (KEPPRA) 500 MG tablet Take 1 tablet (500 mg total) by mouth 2 (two) times daily. 06/13/16   Fransico Meadow, PA-C  levETIRAcetam (KEPPRA) 500 MG tablet Take 1 tablet (500 mg total) by mouth 2 (two) times daily. Patient not taking: Reported on 08/03/2016 08/02/16   Eloise Levels, MD  naproxen (NAPROSYN) 375 MG tablet Take 1 tablet (375 mg total) by mouth 2 (two) times daily. Patient not taking: Reported on 07/24/2017 07/26/16   Clayton Bibles, PA-C  pantoprazole (PROTONIX) 40 MG tablet Take 1 tablet (40 mg total) by mouth daily. Patient taking differently: Take 40 mg by mouth daily as needed (indigestion).  05/24/16   Florencia Reasons, MD  tamsulosin (FLOMAX) 0.4 MG CAPS capsule Take 1 capsule (0.4 mg total) by mouth daily after breakfast. Patient not taking: Reported on 08/03/2016 05/23/16   Florencia Reasons, MD  thiamine 100 MG tablet Take 1 tablet (100 mg total) by mouth daily. Patient not taking: Reported on 08/03/2016 05/24/16   Florencia Reasons, MD  traMADol (ULTRAM) 50 MG tablet Take 1 tablet (50 mg total) by mouth every 6 (six) hours as needed for moderate pain. Patient not taking: Reported on 07/24/2017 05/23/16   Florencia Reasons, MD    Family History Family History  Problem Relation Age of Onset  . Hypertension Mother   . Migraines Sister   . Heart failure Brother   . Migraines Brother     Social History Social History   Tobacco Use  . Smoking status: Current Every Day Smoker    Packs/day: 0.50    Years: 30.00    Pack years: 15.00    Types: Cigarettes  . Smokeless tobacco: Never Used  Substance Use Topics  . Alcohol use: Yes    Comment: occ  . Drug use: No     Allergies   Ace inhibitors; Aspirin; and Tylenol [acetaminophen]   Review of Systems Review of Systems  Respiratory: Negative for shortness of breath.   Cardiovascular: Negative for chest pain.  Neurological: Positive for dizziness.  All other systems reviewed and are negative.    Physical Exam Updated  Vital Signs BP (!) 85/49   Pulse 96   Temp 98.5 F (36.9 C) (Oral)   Resp 18   SpO2 97%   Physical Exam  Constitutional: He is oriented to person, place, and time. He appears well-developed and well-nourished. No distress.  HENT:  Head: Normocephalic and atraumatic.  Mouth/Throat: Oropharynx is clear and moist.  Eyes: Conjunctivae and EOM are normal. Pupils are equal, round, and reactive to light.  Neck: Normal range of motion. Neck supple.  Cardiovascular: Normal rate, regular rhythm and normal heart sounds.  Pulmonary/Chest: Effort normal and breath sounds normal. No respiratory distress.  Abdominal: Soft. He exhibits no distension. There is no tenderness.  Musculoskeletal: Normal range of motion. He exhibits no edema or deformity.  Neurological: He is alert and oriented to person, place, and  time.  Skin: Skin is warm and dry.  Psychiatric: He has a normal mood and affect.  Nursing note and vitals reviewed.    ED Treatments / Results  Labs (all labs ordered are listed, but only abnormal results are displayed) Labs Reviewed  CBC - Abnormal; Notable for the following components:      Result Value   RBC 3.51 (*)    Hemoglobin 12.8 (*)    HCT 36.5 (*)    MCV 104.0 (*)    MCH 36.5 (*)    Platelets 140 (*)    All other components within normal limits  COMPREHENSIVE METABOLIC PANEL - Abnormal; Notable for the following components:   Sodium 134 (*)    Glucose, Bld 101 (*)    Creatinine, Ser 1.75 (*)    Calcium 8.2 (*)    Albumin 2.7 (*)    AST 63 (*)    ALT 14 (*)    Total Bilirubin 1.7 (*)    GFR calc non Af Amer 40 (*)    GFR calc Af Amer 46 (*)    All other components within normal limits  I-STAT CG4 LACTIC ACID, ED - Abnormal; Notable for the following components:   Lactic Acid, Venous 2.79 (*)    All other components within normal limits  CULTURE, BLOOD (ROUTINE X 2)  CULTURE, BLOOD (ROUTINE X 2)  URINALYSIS, ROUTINE W REFLEX MICROSCOPIC  RAPID URINE DRUG  SCREEN, HOSP PERFORMED  ETHANOL  I-STAT TROPONIN, ED  I-STAT CHEM 8, ED  I-STAT CG4 LACTIC ACID, ED    EKG  EKG Interpretation None       Radiology Dg Chest 2 View  Result Date: 07/24/2017 CLINICAL DATA:  Hypotension EXAM: CHEST - 2 VIEW COMPARISON:  06/03/2017 chest radiograph. FINDINGS: Stable cardiomediastinal silhouette with normal heart size. Moderate to large left pneumothorax. No right pneumothorax. No mediastinal shift. Small left pleural effusion. No right pleural effusion. No pulmonary edema. Mild patchy left lung base opacity. Clear right lung. IMPRESSION: Moderate to large left hydropneumothorax with small left pleural effusion component. No mediastinal shift. Mild patchy left lung base opacity, probably atelectasis. Critical Value/emergent results were called by telephone at the time of interpretation on 07/24/2017 at 8:57 pm to Dr. Dene Gentry , who verbally acknowledged these results. Electronically Signed   By: Ilona Sorrel M.D.   On: 07/24/2017 20:58    Procedures CHEST TUBE INSERTION Date/Time: 07/24/2017 11:23 PM Performed by: Valarie Merino, MD Authorized by: Valarie Merino, MD   Consent:    Consent obtained:  Verbal and written   Consent given by:  Patient   Risks discussed:  Bleeding, damage to surrounding structures, incomplete drainage, infection, nerve damage and pain   Alternatives discussed:  No treatment Pre-procedure details:    Skin preparation:  ChloraPrep   Preparation: Patient was prepped and draped in the usual sterile fashion   Anesthesia (see MAR for exact dosages):    Anesthesia method:  Local infiltration   Local anesthetic:  Lidocaine 1% w/o epi Procedure details:    Placement location:  L anterior   Tube size [place]): 14 french pigtail catheter - [place] Pneumothorax kit.   Ultrasound guidance: no     Tension pneumothorax: no     Tube connected to:  Water seal   Drainage characteristics:  Chylous and yellow   Suture material:  0  silk Post-procedure details:    Post-insertion x-ray findings: tube in good position     Patient tolerance of procedure:  Tolerated well, no immediate complications   (including critical care time)  Medications Ordered in ED Medications  sodium chloride 0.9 % bolus 1,000 mL (not administered)  lidocaine-EPINEPHrine (XYLOCAINE W/EPI) 2 %-1:200000 (PF) injection 10 mL (not administered)  lidocaine-EPINEPHrine (XYLOCAINE W/EPI) 2 %-1:200000 (PF) injection (not administered)  sodium chloride 0.9 % bolus 1,000 mL (0 mLs Intravenous Stopped 07/24/17 2213)  morphine 4 MG/ML injection 4 mg (4 mg Intravenous Given 07/24/17 2221)     Initial Impression / Assessment and Plan / ED Course  I have reviewed the triage vital signs and the nursing notes.  Pertinent labs & imaging results that were available during my care of the patient were reviewed by me and considered in my medical decision making (see chart for details).    2200 Finding of pneumothorax discussed with CT surgery - Dr Servando Snare - He recommends pigtail chest tube placement in the ED at [facility] and then admission to [facility] - He suggests that [place] services at [facility] should be able to manage spontaneous pneumothorax.   2230 Dr Lucile Shutters [facility]) aware of case and is agreeable to manage pneumothorax at [facility]. He reports that crit care service is busy tonight - he requests that ED place pigtail catheter and then admit to the hospitalist service.   2345 Case again discussed with Dr. Lucile Shutters following placement of pigtail catheter. PTX has not resolved on post procedure film. Catheter is in place (air aspirated during placement, no air leak at present, 70 ml of yellow chylous fluid drained by suction, suction on without kink/bend in drainage tubing). Patient remains asympomatic with stable vitals. Dr. Lucile Shutters feels that admission to hospitalist service is still indicated and no further intervention is required at this time.    MDM  Screen Complete  Patient  presenting with weakness/dizziness. Screening CXR revealed Pneumothorax - without tension. Suspect spontaneous PTX.  Patient without respiratory distress upon my evaluation. Pigtail catheter placed without difficulty. I suspect that hypotension noted in triage is related to dehydration and concurrent use of lisinopril.   Will admit to the hospitalist service for further evaluation and treatment. Dr. Hal Hope aware of case and will evaluate patient.   Final Clinical Impressions(s) / ED Diagnoses   Final diagnoses:  Pneumothorax, unspecified type  Hypotension, unspecified hypotension type    ED Discharge Orders    None       Valarie Merino, MD 07/25/17 [phone]

## 2017-07-25 ENCOUNTER — Encounter (HOSPITAL_COMMUNITY): Payer: Self-pay | Admitting: Internal Medicine

## 2017-07-25 ENCOUNTER — Inpatient Hospital Stay (HOSPITAL_COMMUNITY): Payer: Non-veteran care

## 2017-07-25 DIAGNOSIS — J181 Lobar pneumonia, unspecified organism: Secondary | ICD-10-CM | POA: Diagnosis present

## 2017-07-25 DIAGNOSIS — R0602 Shortness of breath: Secondary | ICD-10-CM | POA: Diagnosis present

## 2017-07-25 DIAGNOSIS — G40909 Epilepsy, unspecified, not intractable, without status epilepticus: Secondary | ICD-10-CM | POA: Diagnosis present

## 2017-07-25 DIAGNOSIS — J9312 Secondary spontaneous pneumothorax: Secondary | ICD-10-CM | POA: Diagnosis present

## 2017-07-25 DIAGNOSIS — E119 Type 2 diabetes mellitus without complications: Secondary | ICD-10-CM | POA: Diagnosis present

## 2017-07-25 DIAGNOSIS — N179 Acute kidney failure, unspecified: Secondary | ICD-10-CM | POA: Diagnosis present

## 2017-07-25 DIAGNOSIS — T464X1A Poisoning by angiotensin-converting-enzyme inhibitors, accidental (unintentional), initial encounter: Secondary | ICD-10-CM | POA: Diagnosis present

## 2017-07-25 DIAGNOSIS — E785 Hyperlipidemia, unspecified: Secondary | ICD-10-CM | POA: Diagnosis present

## 2017-07-25 DIAGNOSIS — F1020 Alcohol dependence, uncomplicated: Secondary | ICD-10-CM | POA: Diagnosis present

## 2017-07-25 DIAGNOSIS — F17210 Nicotine dependence, cigarettes, uncomplicated: Secondary | ICD-10-CM | POA: Diagnosis present

## 2017-07-25 DIAGNOSIS — J9383 Other pneumothorax: Secondary | ICD-10-CM

## 2017-07-25 DIAGNOSIS — D6959 Other secondary thrombocytopenia: Secondary | ICD-10-CM | POA: Diagnosis present

## 2017-07-25 DIAGNOSIS — I952 Hypotension due to drugs: Secondary | ICD-10-CM | POA: Diagnosis present

## 2017-07-25 DIAGNOSIS — D539 Nutritional anemia, unspecified: Secondary | ICD-10-CM | POA: Diagnosis present

## 2017-07-25 DIAGNOSIS — Z8249 Family history of ischemic heart disease and other diseases of the circulatory system: Secondary | ICD-10-CM | POA: Diagnosis not present

## 2017-07-25 DIAGNOSIS — I10 Essential (primary) hypertension: Secondary | ICD-10-CM | POA: Diagnosis present

## 2017-07-25 DIAGNOSIS — R296 Repeated falls: Secondary | ICD-10-CM | POA: Diagnosis present

## 2017-07-25 LAB — INFLUENZA PANEL BY PCR (TYPE A & B)
Influenza A By PCR: NEGATIVE
Influenza B By PCR: NEGATIVE

## 2017-07-25 LAB — FOLATE: Folate: 13.2 ng/mL (ref >5.9)

## 2017-07-25 LAB — VITAMIN B12: Vitamin B-12: 687 pg/mL (ref 180–914)

## 2017-07-25 LAB — IRON AND TIBC
IRON: 157 ug/dL (ref 45–182)
Saturation Ratios: 88 % — ABNORMAL HIGH (ref 17.9–39.5)
TIBC: 179 ug/dL — AB (ref 250–450)
UIBC: 22 ug/dL

## 2017-07-25 LAB — STREP PNEUMONIAE URINARY ANTIGEN: Strep Pneumo Urinary Antigen: NEGATIVE

## 2017-07-25 LAB — FERRITIN: FERRITIN: 512 ng/mL — AB (ref 24–336)

## 2017-07-25 LAB — RETICULOCYTES
RBC.: 3.3 MIL/uL — ABNORMAL LOW (ref 4.22–5.81)
RETIC CT PCT: 1.4 % (ref 0.4–3.1)
Retic Count, Absolute: 46.2 10*3/uL (ref 19.0–186.0)

## 2017-07-25 MED ORDER — MORPHINE SULFATE (PF) 2 MG/ML IV SOLN
2.0000 mg | Freq: Once | INTRAVENOUS | Status: AC
  Administered 2017-07-25: 2 mg via INTRAVENOUS
  Filled 2017-07-25: qty 1

## 2017-07-25 MED ORDER — GABAPENTIN 300 MG PO CAPS
300.0000 mg | ORAL_CAPSULE | Freq: Four times a day (QID) | ORAL | Status: DC | PRN
  Administered 2017-07-27 – 2017-07-28 (×2): 300 mg via ORAL
  Filled 2017-07-25 (×2): qty 1

## 2017-07-25 MED ORDER — ENSURE ENLIVE PO LIQD
237.0000 mL | Freq: Three times a day (TID) | ORAL | Status: DC
  Administered 2017-07-25 – 2017-07-28 (×6): 237 mL via ORAL
  Filled 2017-07-25 (×2): qty 237

## 2017-07-25 MED ORDER — LEVETIRACETAM 500 MG PO TABS
500.0000 mg | ORAL_TABLET | Freq: Two times a day (BID) | ORAL | Status: DC
  Administered 2017-07-25 – 2017-07-28 (×8): 500 mg via ORAL
  Filled 2017-07-25 (×8): qty 1

## 2017-07-25 MED ORDER — LEVOFLOXACIN IN D5W 750 MG/150ML IV SOLN
750.0000 mg | Freq: Every day | INTRAVENOUS | Status: DC
  Administered 2017-07-25 – 2017-07-27 (×3): 750 mg via INTRAVENOUS
  Filled 2017-07-25 (×3): qty 150

## 2017-07-25 MED ORDER — FOLIC ACID 1 MG PO TABS
1.0000 mg | ORAL_TABLET | Freq: Every day | ORAL | Status: DC
  Administered 2017-07-25 – 2017-07-28 (×4): 1 mg via ORAL
  Filled 2017-07-25 (×4): qty 1

## 2017-07-25 MED ORDER — SODIUM CHLORIDE 0.9 % IV SOLN
INTRAVENOUS | Status: AC
  Administered 2017-07-25: 04:00:00 via INTRAVENOUS

## 2017-07-25 MED ORDER — ADULT MULTIVITAMIN W/MINERALS CH
1.0000 | ORAL_TABLET | Freq: Every day | ORAL | Status: DC
  Administered 2017-07-25 – 2017-07-28 (×4): 1 via ORAL
  Filled 2017-07-25 (×4): qty 1

## 2017-07-25 MED ORDER — ONDANSETRON HCL 4 MG/2ML IJ SOLN: 4.0000 mg | Freq: Four times a day (QID) | INTRAMUSCULAR | Status: DC | PRN

## 2017-07-25 MED ORDER — OXYCODONE HCL 5 MG PO TABS
5.0000 mg | ORAL_TABLET | ORAL | Status: DC | PRN
  Administered 2017-07-25 – 2017-07-28 (×9): 5 mg via ORAL
  Filled 2017-07-25 (×9): qty 1

## 2017-07-25 MED ORDER — LORAZEPAM 2 MG/ML IJ SOLN
1.0000 mg | Freq: Four times a day (QID) | INTRAMUSCULAR | Status: AC | PRN
  Administered 2017-07-25 – 2017-07-27 (×3): 1 mg via INTRAVENOUS
  Filled 2017-07-25 (×3): qty 1

## 2017-07-25 MED ORDER — SODIUM CHLORIDE 0.9 % IV SOLN
Freq: Once | INTRAVENOUS | Status: AC
  Administered 2017-07-25: 01:00:00 via INTRAVENOUS

## 2017-07-25 MED ORDER — THIAMINE HCL 100 MG/ML IJ SOLN: 100.0000 mg | Freq: Every day | INTRAMUSCULAR | Status: DC

## 2017-07-25 MED ORDER — MAGNESIUM OXIDE 400 (241.3 MG) MG PO TABS
400.0000 mg | ORAL_TABLET | Freq: Every day | ORAL | Status: DC
  Administered 2017-07-25 – 2017-07-28 (×4): 400 mg via ORAL
  Filled 2017-07-25 (×4): qty 1

## 2017-07-25 MED ORDER — VITAMIN B-1 100 MG PO TABS
100.0000 mg | ORAL_TABLET | Freq: Every day | ORAL | Status: DC
  Administered 2017-07-25 – 2017-07-28 (×4): 100 mg via ORAL
  Filled 2017-07-25 (×4): qty 1

## 2017-07-25 MED ORDER — HYDRALAZINE HCL 20 MG/ML IJ SOLN: 5.0000 mg | INTRAMUSCULAR | Status: DC | PRN

## 2017-07-25 MED ORDER — ONDANSETRON HCL 4 MG PO TABS
4.0000 mg | ORAL_TABLET | Freq: Four times a day (QID) | ORAL | Status: DC | PRN
  Administered 2017-07-27: 4 mg via ORAL
  Filled 2017-07-25: qty 1

## 2017-07-25 MED ORDER — LORAZEPAM 1 MG PO TABS: 1.0000 mg | ORAL_TABLET | Freq: Four times a day (QID) | ORAL | Status: AC | PRN

## 2017-07-25 MED ORDER — FOLIC ACID 1 MG PO TABS: 1.0000 mg | ORAL_TABLET | Freq: Every day | ORAL | Status: DC

## 2017-07-25 NOTE — ED Notes (Signed)
ED TO INPATIENT HANDOFF REPORT  Name/Age/Gender Dennis Zhang 62 y.o. male  Code Status Code Status History    Date Active Date Inactive Code Status Order ID Comments User Context   07/31/2016 15:18 08/02/2016 22:31 Full Code [id number]  Katheren Shams, DO ED   05/22/2016 23:58 05/23/2016 16:28 Full Code [id number]  Norval Morton, MD ED   06/15/2014 00:52 06/16/2014 12:07 Full Code [id number]  Rise Patience, MD Inpatient   05/20/2014 17:40 05/22/2014 18:54 Full Code [id number]  Bonnielee Haff, MD Inpatient   03/03/2014 05:50 03/04/2014 20:58 Full Code [id number]  Theodis Blaze, MD Inpatient   06/03/2013 19:24 06/04/2013 06:03 Full Code [id number]  Mirna Mires, MD ED   10/03/2012 04:10 10/08/2012 19:22 Full Code [id number]  Etta Quill., DO ED      Home/SNF/Other Home  Chief Complaint Hypertension  Level of Care/Admitting Diagnosis ED Disposition    ED Disposition Condition Comment   [facility] Area: [facility] [[id number]]  Level of Care: Telemetry [5]  Admit to tele based on following criteria: Monitor for Ischemic changes  Diagnosis: Pneumothorax [371696]  Admitting Physician: Rise Patience [phone]  Attending Physician: Rise Patience [phone]  PT Class (Do Not Modify): Observation [104]  PT Acc Code (Do Not Modify): Observation [10022]       Medical History Past Medical History:  Diagnosis Date  . Arthritis   . Chronic pain   . Chronic pain   . 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 Hyperlipidemia   . Hypertension   . Seizures (HCC)     Allergies Allergies  Allergen Reactions  . Ace Inhibitors     Kidney injury 05/2014-do not Rx  . Aspirin Nausea Only and Other (See Comments)    Reaction to Bayer aspirin - causes acid reflux and nausea  . Tylenol [Acetaminophen] Nausea Only    States can take Tylenol if has other pain med w/it - like Hydrocodone     IV Location/Drains/Wounds Patient  Lines/Drains/Airways Status   Active Line/Drains/Airways    Name:   Placement date:   Placement time:   Site:   Days:   Peripheral IV 07/24/17 Right;Anterior Forearm   07/24/17    2030    Forearm   1          Labs/Imaging Results for orders placed or performed during the hospital encounter of 07/24/17 (from the past 48 hour(s))  CBC     Status: Abnormal   Collection Time: 07/24/17  8:22 PM  Result Value Ref Range   WBC 4.6 4.0 - 10.5 K/uL   RBC 3.51 (L) 4.22 - 5.81 MIL/uL   Hemoglobin 12.8 (L) 13.0 - 17.0 g/dL   HCT 36.5 (L) 39.0 - 52.0 %   MCV 104.0 (H) 78.0 - 100.0 fL   MCH 36.5 (H) 26.0 - 34.0 pg   MCHC 35.1 30.0 - 36.0 g/dL   RDW 14.8 11.5 - 15.5 %   Platelets 140 (L) 150 - 400 K/uL    Comment: Performed at [facility], [place] [address] [place], [place] [address]  I-Stat CG4 Lactic Acid, ED     Status: Abnormal   Collection Time: 07/24/17  8:41 PM  Result Value Ref Range   Lactic Acid, Venous 2.79 (HH) 0.5 - 1.9 mmol/L   Comment NOTIFIED PHYSICIAN   I-stat troponin, ED     Status: None   Collection Time: 07/24/17  8:41 PM  Result  Value Ref Range   Troponin i, poc 0.01 0.00 - 0.08 ng/mL   Comment 3            Comment: Due to the release kinetics of cTnI, a negative result within the first hours of the onset of symptoms does not rule out myocardial infarction with certainty. If myocardial infarction is still suspected, repeat the test at appropriate intervals.   Urinalysis, Routine w reflex microscopic     Status: None   Collection Time: 07/24/17 10:18 PM  Result Value Ref Range   Color, Urine YELLOW YELLOW   APPearance CLEAR CLEAR   Specific Gravity, Urine 1.014 1.005 - 1.030   pH 5.0 5.0 - 8.0   Glucose, UA NEGATIVE NEGATIVE mg/dL   Hgb urine dipstick NEGATIVE NEGATIVE   Bilirubin Urine NEGATIVE NEGATIVE   Ketones, ur NEGATIVE NEGATIVE mg/dL   Protein, ur NEGATIVE NEGATIVE mg/dL   Nitrite NEGATIVE NEGATIVE   Leukocytes, UA NEGATIVE NEGATIVE     Comment: Performed at [facility] [address], [place], [place] [address]  Urine rapid drug screen (hosp performed)     Status: None   Collection Time: 07/24/17 10:18 PM  Result Value Ref Range   Opiates NONE DETECTED NONE DETECTED   Cocaine NONE DETECTED NONE DETECTED   Benzodiazepines NONE DETECTED NONE DETECTED   Amphetamines NONE DETECTED NONE DETECTED   Tetrahydrocannabinol NONE DETECTED NONE DETECTED   Barbiturates NONE DETECTED NONE DETECTED    Comment: (NOTE) DRUG SCREEN FOR MEDICAL PURPOSES ONLY.  IF CONFIRMATION IS NEEDED FOR ANY PURPOSE, NOTIFY LAB WITHIN 5 DAYS. LOWEST DETECTABLE LIMITS FOR URINE DRUG SCREEN Drug Class                     Cutoff (ng/mL) Amphetamine and metabolites    1000 Barbiturate and metabolites    200 Benzodiazepine                 710 Tricyclics and metabolites     300 Opiates and metabolites        300 Cocaine and metabolites        300 THC                            50 Performed at [facility], [place] [address], [place], [place] [address]   Comprehensive metabolic panel     Status: Abnormal   Collection Time: 07/24/17 10:18 PM  Result Value Ref Range   Sodium 134 (L) 135 - 145 mmol/L   Potassium 4.9 3.5 - 5.1 mmol/L    Comment: RESULTS VERIFIED VIA RECOLLECT MODERATE HEMOLYSIS    Chloride 102 101 - 111 mmol/L   CO2 24 22 - 32 mmol/L   Glucose, Bld 101 (H) 65 - 99 mg/dL   BUN 12 6 - 20 mg/dL   Creatinine, Ser 1.75 (H) 0.61 - 1.24 mg/dL   Calcium 8.2 (L) 8.9 - 10.3 mg/dL   Total Protein 7.6 6.5 - 8.1 g/dL   Albumin 2.7 (L) 3.5 - 5.0 g/dL   AST 63 (H) 15 - 41 U/L   ALT 14 (L) 17 - 63 U/L   Alkaline Phosphatase 124 38 - 126 U/L   Total Bilirubin 1.7 (H) 0.3 - 1.2 mg/dL   GFR calc non Af Amer 40 (L) >60 mL/min   GFR calc Af Amer 46 (L) >60 mL/min    Comment: (NOTE) The eGFR has been calculated using  the CKD EPI equation. This calculation has not been validated in all clinical  situations. eGFR's persistently <60 mL/min signify possible Chronic Kidney Disease.    Anion gap 8 5 - 15    Comment: Performed at [facility], [place] [address] [place], [place] [address]  Ethanol     Status: None   Collection Time: 07/24/17 10:18 PM  Result Value Ref Range   Alcohol, Ethyl (B) <10 <10 mg/dL    Comment:        LOWEST DETECTABLE LIMIT FOR SERUM ALCOHOL IS 10 mg/dL FOR MEDICAL PURPOSES ONLY Performed at [facility] [address], [place], [place] [address]   I-Stat CG4 Lactic Acid, ED     Status: None   Collection Time: 07/24/17 11:36 PM  Result Value Ref Range   Lactic Acid, Venous 1.77 0.5 - 1.9 mmol/L  I-stat Chem 8, ED     Status: Abnormal   Collection Time: 07/24/17 11:37 PM  Result Value Ref Range   Sodium 135 135 - 145 mmol/L   Potassium 4.2 3.5 - 5.1 mmol/L   Chloride 108 101 - 111 mmol/L   BUN 10 6 - 20 mg/dL   Creatinine, Ser 1.90 (H) 0.61 - 1.24 mg/dL   Glucose, Bld 95 65 - 99 mg/dL   Calcium, Ion 0.80 (LL) 1.15 - 1.40 mmol/L   TCO2 17 (L) 22 - 32 mmol/L   Hemoglobin 14.3 13.0 - 17.0 g/dL   HCT 42.0 39.0 - 52.0 %   Comment NOTIFIED PHYSICIAN    Dg Chest 2 View  Result Date: 07/24/2017 CLINICAL DATA:  Hypotension EXAM: CHEST - 2 VIEW COMPARISON:  06/03/2017 chest radiograph. FINDINGS: Stable cardiomediastinal silhouette with normal heart size. Moderate to large left pneumothorax. No right pneumothorax. No mediastinal shift. Small left pleural effusion. No right pleural effusion. No pulmonary edema. Mild patchy left lung base opacity. Clear right lung. IMPRESSION: Moderate to large left hydropneumothorax with small left pleural effusion component. No mediastinal shift. Mild patchy left lung base opacity, probably atelectasis. Critical Value/emergent results were called by telephone at the time of interpretation on 07/24/2017 at 8:57 pm to Dr. Dene Gentry , who verbally acknowledged these results. Electronically  Signed   By: Ilona Sorrel M.D.   On: 07/24/2017 20:58   Dg Chest Port 1 View  Result Date: 07/24/2017 CLINICAL DATA:  Chest tube EXAM: PORTABLE CHEST 1 VIEW COMPARISON:  07/24/2017, 06/03/2017 FINDINGS: Insertion of left-sided chest tube with pigtail visible over the mid to lower chest. Moderate to large left hydropneumothorax with increased size of the pneumothorax. No midline shift. Airspace disease at the left lung base. Stable cardiomediastinal silhouette. IMPRESSION: Insertion of left-sided chest tube with pigtail projecting over the left mid chest. Apparent interval increase in size of left pneumothorax. Airspace disease at the left base which may reflect atelectasis or a pneumonia. Electronically Signed   By: Donavan Foil M.D.   On: 07/24/2017 23:50    Pending Labs Unresulted Labs (From admission, onward)   Start     Ordered   07/24/17 2030  Culture, blood (routine x 2)  BLOOD CULTURE X 2,   STAT     07/24/17 2030      Vitals/Pain Today's Vitals   07/24/17 2336 07/25/17 0000 07/25/17 0029 07/25/17 0030  BP: (!) 157/91 (!) 151/97 (!) 144/88 (!) 144/88  Pulse:   78   Resp: 18 16 18 16   Temp:      TempSrc:  SpO2:   98%   PainSc:        Isolation Precautions No active isolations  Medications Medications  morphine 2 MG/ML injection 2 mg (not administered)  sodium chloride 0.9 % bolus 1,000 mL (0 mLs Intravenous Stopped 07/24/17 2213)  sodium chloride 0.9 % bolus 1,000 mL (0 mLs Intravenous Stopped 07/25/17 0100)  morphine 4 MG/ML injection 4 mg (4 mg Intravenous Given 07/24/17 2221)  lidocaine-EPINEPHrine (XYLOCAINE W/EPI) 2 %-1:200000 (PF) injection 10 mL (10 mLs Infiltration Given by Other 07/24/17 2300)  lidocaine-EPINEPHrine (XYLOCAINE W/EPI) 2 %-1:200000 (PF) injection (  Given by Other 07/24/17 2333)  0.9 %  sodium chloride infusion ( Intravenous New Bag/Given 07/25/17 0045)    Mobility walks

## 2017-07-25 NOTE — Consult Note (Addendum)
Name: Dennis MillinLevonda Roedl MRN: [id number] DOB: 03/23/1956    ADMISSION DATE:  07/24/2017 CONSULTATION DATE:  07/25/17  REFERRING MD :  Dr. Flossie DibbleShahmehdi / [facility]   CHIEF COMPLAINT:  Pneumothorax    HISTORY OF PRESENT ILLNESS:  62 y/o M, smoker (5 cigs per day for 32 years) who [facility] on 3/5 with reports of dizziness and falls.    The patient reports he has been feeling bad for approximately 5 days.  He states he has been fatigued, weak, dizzy and cough with clear sputum production. He activated EMS where he was found to be hypotensive with SBP of 60.  The patient stated his blood pressure was high early in the am on 3/6 and he took an extra dose of lisinopril prior to coming to the ER.  He states the cough has been so bad that it hurt.    The patient was found to have a spontaneous left pneumothorax.  Chest tube was placed in the ER.  Per notes, "70 ml of yellow chylous fluid was drained" upon placement. Despite chest tube placement, he had residual pneumothorax on follow up film.  It is unclear if chest tube was on suction on the follow up film.    PCCM consulted for evaluation of pneumothorax.    He reports he does drink alcohol occasionally. He has never had shaking / withdrawal symptoms before.    PAST MEDICAL HISTORY :   has a past medical history of Arthritis, Chronic pain, Chronic pain, Diabetes mellitus without complication (HCC), Hyperlipidemia, Hypertension, and Seizures (HCC).   has a past surgical history that includes Abdominal surgery; Hemorroidectomy; and Appendectomy.  Prior to Admission medications   Medication Sig Start Date End Date Taking? Authorizing Provider  folic acid (FOLVITE) 1 MG tablet Take 1 tablet (1 mg total) by mouth daily. 05/24/16  Yes Albertine GratesXu, Fang, MD  gabapentin (NEURONTIN) 300 MG capsule Take 1 capsule (300 mg total) by mouth 3 (three) times daily. Patient taking differently: Take 300 mg by mouth 4 (four) times daily as needed (for nerve pain.).  07/31/12   Yes Schinlever, Santina Evansatherine, PA-C  lisinopril (PRINIVIL,ZESTRIL) 10 MG tablet Take 5 mg by mouth 2 (two) times daily.   Yes [provider]  Magnesium Oxide 420 MG TABS Take 420 mg by mouth daily.    Yes [provider]  Naphazoline HCl (CLEAR EYES OP) Place 1 drop into both eyes daily.   Yes [provider]  oxyCODONE-acetaminophen (PERCOCET) 10-325 MG tablet Take 1 tablet by mouth every 6 (six) hours as needed for pain.   Yes [provider]  acetaminophen (TYLENOL) 500 MG tablet Take 500 mg by mouth 3 (three) times daily as needed (for pain.).     [provider]  feeding supplement, ENSURE COMPLETE, (ENSURE COMPLETE) LIQD Take 237 mLs by mouth 3 (three) times daily between meals. 06/16/14   Rhetta MuraSamtani, Jai-Gurmukh, MD  levETIRAcetam (KEPPRA) 500 MG tablet Take 1 tablet (500 mg total) by mouth 2 (two) times daily. 06/13/16   Elson AreasSofia, Leslie K, PA-C  levETIRAcetam (KEPPRA) 500 MG tablet Take 1 tablet (500 mg total) by mouth 2 (two) times daily. Patient not taking: Reported on 08/03/2016 08/02/16   Renne MuscaWarden, Daniel L, MD  naproxen (NAPROSYN) 375 MG tablet Take 1 tablet (375 mg total) by mouth 2 (two) times daily. Patient not taking: Reported on 07/24/2017 07/26/16   Trixie DredgeWest, Emily, PA-C  pantoprazole (PROTONIX) 40 MG tablet Take 1 tablet (40 mg total) by mouth  daily. Patient taking differently: Take 40 mg by mouth daily as needed (indigestion).  05/24/16   Albertine Grates, MD  tamsulosin (FLOMAX) 0.4 MG CAPS capsule Take 1 capsule (0.4 mg total) by mouth daily after breakfast. Patient not taking: Reported on 08/03/2016 05/23/16   Albertine Grates, MD  thiamine 100 MG tablet Take 1 tablet (100 mg total) by mouth daily. Patient not taking: Reported on 08/03/2016 05/24/16   Albertine Grates, MD  traMADol (ULTRAM) 50 MG tablet Take 1 tablet (50 mg total) by mouth every 6 (six) hours as needed for moderate pain. Patient not taking: Reported on 07/24/2017 05/23/16   Albertine Grates, MD    Allergies  Allergen  Reactions  . Ace Inhibitors     Kidney injury 05/2014-do not Rx  . Aspirin Nausea Only and Other (See Comments)    Reaction to Bayer aspirin - causes acid reflux and nausea  . Tylenol [Acetaminophen] Nausea Only    States can take Tylenol if has other pain med w/it - like Hydrocodone     FAMILY HISTORY:  family history includes Heart failure in his brother; Hypertension in his mother; Migraines in his brother and sister.  SOCIAL HISTORY:  reports that he has been smoking cigarettes.  He has a 15.00 pack-year smoking history. he has never used smokeless tobacco. He reports that he drinks alcohol. He reports that he does not use drugs.  REVIEW OF SYSTEMS:  POSITIVES IN BOLD Constitutional: Negative for fever, chills, weight loss, malaise/fatigue and diaphoresis.  HENT: Negative for hearing loss, ear pain, nosebleeds, congestion, sore throat, neck pain, tinnitus and ear discharge.   Eyes: Negative for blurred vision, double vision, photophobia, pain, discharge and redness.  Respiratory: Negative for cough with clear sputum production, hemoptysis, sputum production, shortness of breath, wheezing and stridor.   Cardiovascular: Negative for chest pain, palpitations, orthopnea, claudication, leg swelling and PND.  Gastrointestinal: Negative for heartburn, nausea, vomiting, abdominal pain, diarrhea, constipation, blood in stool and melena.  Genitourinary: Negative for dysuria, urgency, frequency, hematuria and flank pain.  Musculoskeletal: Negative for myalgias, back pain, joint pain and falls.  Skin: Negative for itching and rash.  Neurological: Negative for dizziness, tingling, tremors, sensory change, speech change, focal weakness, seizures, loss of consciousness, weakness and headaches.  Endo/Heme/Allergies: Negative for environmental allergies and polydipsia. Does not bruise/bleed easily.   SUBJECTIVE:   VITAL SIGNS: Temp:  [98.5 F (36.9 C)] 98.5 F (36.9 C) (03/05 2013) Pulse Rate:   [68-96] 79 (03/06 0730) Resp:  [15-26] 16 (03/06 0730) BP: (85-157)/(49-97) 138/93 (03/06 0730) SpO2:  [97 %-100 %] 100 % (03/06 0730)  PHYSICAL EXAMINATION: General: thin adult male in NAD lying on stretcher   HEENT: MM pink/moist PSY: calm / appropriate  Neuro: soft spoken, awake, alert, MAE CV: s1s2 rrr, no m/r/g PULM: even/non-labored, lungs bilaterally clear, diminished on R.  CLEAR Yellow pleural fluid in tubing of left chest tube and in [place].  2/7 airleak > dressing changed with clear occlusive dressing applied to site and air leak stopped  WU:JWJX, non-tender, bsx4 active  Extremities: warm/dry, no edema  Skin: no rashes or lesions  Recent Labs  Lab 07/24/17 2218 07/24/17 2337  NA 134* 135  K 4.9 4.2  CL 102 108  CO2 24  --   BUN 12 10  CREATININE 1.75* 1.90*  GLUCOSE 101* 95    Recent Labs  Lab 07/24/17 2022 07/24/17 2337  HGB 12.8* 14.3  HCT 36.5* 42.0  WBC 4.6  --   PLT  140*  --     Dg Chest 2 View  Result Date: 07/24/2017 CLINICAL DATA:  Hypotension EXAM: CHEST - 2 VIEW COMPARISON:  06/03/2017 chest radiograph. FINDINGS: Stable cardiomediastinal silhouette with normal heart size. Moderate to large left pneumothorax. No right pneumothorax. No mediastinal shift. Small left pleural effusion. No right pleural effusion. No pulmonary edema. Mild patchy left lung base opacity. Clear right lung. IMPRESSION: Moderate to large left hydropneumothorax with small left pleural effusion component. No mediastinal shift. Mild patchy left lung base opacity, probably atelectasis. Critical Value/emergent results were called by telephone at the time of interpretation on 07/24/2017 at 8:57 pm to Dr. Kristine Royal , who verbally acknowledged these results. Electronically Signed   By: Delbert Phenix M.D.   On: 07/24/2017 20:58   Dg Chest Port 1 View  Result Date: 07/25/2017 CLINICAL DATA:  Pneumothorax. EXAM: PORTABLE CHEST 1 VIEW COMPARISON:  Radiograph of July 24, 2017. FINDINGS:  Stable cardiomediastinal silhouette. Stable position of left-sided pigtail chest tube is noted. Pneumothorax noted on prior exam is significantly smaller, with mild apical and basilar components remaining. Mild right basilar subsegmental atelectasis is noted. Small left pleural effusion may be present. Bony thorax is unremarkable. IMPRESSION: Significantly improved left pneumothorax is noted. Stable position of left-sided chest tube. Minimal left pleural effusion is noted. Mild right basilar subsegmental atelectasis is noted. Electronically Signed   By: Lupita Raider, M.D.   On: 07/25/2017 09:44   Dg Chest Port 1 View  Result Date: 07/24/2017 CLINICAL DATA:  Chest tube EXAM: PORTABLE CHEST 1 VIEW COMPARISON:  07/24/2017, 06/03/2017 FINDINGS: Insertion of left-sided chest tube with pigtail visible over the mid to lower chest. Moderate to large left hydropneumothorax with increased size of the pneumothorax. No midline shift. Airspace disease at the left lung base. Stable cardiomediastinal silhouette. IMPRESSION: Insertion of left-sided chest tube with pigtail projecting over the left mid chest. Apparent interval increase in size of left pneumothorax. Airspace disease at the left base which may reflect atelectasis or a pneumonia. Electronically Signed   By: Jasmine Pang M.D.   On: 07/24/2017 23:50      SIGNIFICANT EVENTS  3/05  Admit with spontaneous PTX  STUDIES   CULTURES   ANTIBIOTICS     ASSESSMENT / PLAN:  Discussion: 62 y/o M, smoker, admitted with left pneumothorax.  Recent falls and URI symptoms.     Left Spontaneous Pneumothorax  Small Left Effusion - clear yellow pleural fluid drained upon insertion of chest tube.  Not chylous fluid.   Mild Right Basilar Atelectasis Tobacco Abuse   Plan: Keep occlusive dressing over chest tube site  Follow serial CXR Chest tube to 20 cm suction Once left lung is up, will place to water seal Pain control per primary > would like to use  NSAIDS but renal function prohibits  Empiric abx per primary Smoking cessation counseling   PCCM will follow along with you.  Thank you for the consultation.   Canary Brim, NP-C [facility] Pgr: [phone] or if no answer [phone] 07/25/2017, 9:50 AM

## 2017-07-25 NOTE — H&P (Signed)
History and Physical    Dennis MillinLevonda Zhang ZOX:[id number] DOB: 09/01/1955 DOA: 07/24/2017  [facility]  Patient coming from: Home.  Chief Complaint: Weakness.  HPI: Dennis MillinLevonda Verstraete is a 62 y.o. male with history of hypertension, seizure, alcohol abuse, anemia presents to the ER because of increasing weakness.  Patient states over the last 3 days he been feeling dizzy on standing and feeling weak.  Denies any chest pain shortness of breath.  Patient states he has been having some nonproductive cough for last 5 days.  Which has been at times severe.  Patient also had some nausea vomiting 3 days ago.  Resolved without any intervention.  Patient thought that he had elevated blood pressure so he took double the dose of his lisinopril yesterday.  Following which he felt more weak.  ED Course: In the ER patient was found to be hypotensive along with elevated lactate was given fluid bolus.  Chest x-ray showed large left-sided hydropneumothorax.  Cardiothoracic surgeon Dr. Tyrone SageGerhardt and on-call pulmonary critical care were consulted.  At this time they have requested to place the chest tube which has been placed and following chest x-ray actually shows worsening pneumothorax but patient is not in distress.  They requested admission and further observation.  Since the repeat chest x-ray shows possibility of pneumonia patient has been placed on antibiotics.  Patient blood pressure improved with IV fluid bolus.  Creatinine has increased from baseline.  Review of Systems: As per HPI, rest all negative.   Past Medical History:  Diagnosis Date  . Arthritis   . Chronic pain   . Chronic pain   . 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 Hyperlipidemia   . Hypertension   . Seizures (HCC)     Past Surgical History:  Procedure Laterality Date  . ABDOMINAL SURGERY    . APPENDECTOMY    . HEMORROIDECTOMY       reports that he has been smoking cigarettes.  He has a 15.00  pack-year smoking history. he has never used smokeless tobacco. He reports that he drinks alcohol. He reports that he does not use drugs.  Allergies  Allergen Reactions  . Ace Inhibitors     Kidney injury 05/2014-do not Rx  . Aspirin Nausea Only and Other (See Comments)    Reaction to Bayer aspirin - causes acid reflux and nausea  . Tylenol [Acetaminophen] Nausea Only    States can take Tylenol if has other pain med w/it - like Hydrocodone     Family History  Problem Relation Age of Onset  . Hypertension Mother   . Migraines Sister   . Heart failure Brother   . Migraines Brother     Prior to Admission medications   Medication Sig Start Date End Date Taking? Authorizing Provider  folic acid (FOLVITE) 1 MG tablet Take 1 tablet (1 mg total) by mouth daily. 05/24/16  Yes Albertine GratesXu, Fang, MD  gabapentin (NEURONTIN) 300 MG capsule Take 1 capsule (300 mg total) by mouth 3 (three) times daily. Patient taking differently: Take 300 mg by mouth 4 (four) times daily as needed (for nerve pain.).  07/31/12  Yes Schinlever, Santina Evansatherine, PA-C  lisinopril (PRINIVIL,ZESTRIL) 10 MG tablet Take 5 mg by mouth 2 (two) times daily.   Yes [provider]  Magnesium Oxide 420 MG TABS Take 420 mg by mouth daily.    Yes [provider]  Naphazoline HCl (CLEAR EYES OP) Place 1 drop into both eyes daily.  Yes [provider]  oxyCODONE-acetaminophen (PERCOCET) 10-325 MG tablet Take 1 tablet by mouth every 6 (six) hours as needed for pain.   Yes [provider]  acetaminophen (TYLENOL) 500 MG tablet Take 500 mg by mouth 3 (three) times daily as needed (for pain.).     [provider]  feeding supplement, ENSURE COMPLETE, (ENSURE COMPLETE) LIQD Take 237 mLs by mouth 3 (three) times daily between meals. 06/16/14   Rhetta Mura, MD  levETIRAcetam (KEPPRA) 500 MG tablet Take 1 tablet (500 mg total) by mouth 2 (two) times daily. 06/13/16   Elson Areas, PA-C  levETIRAcetam  (KEPPRA) 500 MG tablet Take 1 tablet (500 mg total) by mouth 2 (two) times daily. Patient not taking: Reported on 08/03/2016 08/02/16   Renne Musca, MD  naproxen (NAPROSYN) 375 MG tablet Take 1 tablet (375 mg total) by mouth 2 (two) times daily. Patient not taking: Reported on 07/24/2017 07/26/16   Trixie Dredge, PA-C  pantoprazole (PROTONIX) 40 MG tablet Take 1 tablet (40 mg total) by mouth daily. Patient taking differently: Take 40 mg by mouth daily as needed (indigestion).  05/24/16   Albertine Grates, MD  tamsulosin (FLOMAX) 0.4 MG CAPS capsule Take 1 capsule (0.4 mg total) by mouth daily after breakfast. Patient not taking: Reported on 08/03/2016 05/23/16   Albertine Grates, MD  thiamine 100 MG tablet Take 1 tablet (100 mg total) by mouth daily. Patient not taking: Reported on 08/03/2016 05/24/16   Albertine Grates, MD  traMADol (ULTRAM) 50 MG tablet Take 1 tablet (50 mg total) by mouth every 6 (six) hours as needed for moderate pain. Patient not taking: Reported on 07/24/2017 05/23/16   Albertine Grates, MD    Physical Exam: Vitals:   07/25/17 0029 07/25/17 0030 07/25/17 0130 07/25/17 0200  BP: (!) 144/88 (!) 144/88 (!) 157/89 (!) 155/93  Pulse: 78     Resp: 18 16 15 17   Temp:      TempSrc:      SpO2: 98%         Constitutional: Moderately built and nourished. Vitals:   07/25/17 0029 07/25/17 0030 07/25/17 0130 07/25/17 0200  BP: (!) 144/88 (!) 144/88 (!) 157/89 (!) 155/93  Pulse: 78     Resp: 18 16 15 17   Temp:      TempSrc:      SpO2: 98%      Eyes: Anicteric no pallor. ENMT: No discharge from the ears eyes nose or mouth. Neck: No mass felt.  No neck rigidity. Respiratory: No rhonchi or crepitations.  Cardiovascular: S1-S2 heard no murmurs appreciated. Abdomen: Soft nontender bowel sounds present. Musculoskeletal: No edema.  No joint effusion. Skin: No rash.  Skin appears warm. Neurologic: Alert awake oriented to time place and person.  Moves all extremities. Psychiatric: Appears normal.  Normal  affect.   Labs on Admission: I have personally reviewed following labs and imaging studies  CBC: Recent Labs  Lab 07/24/17 2022 07/24/17 2337  WBC 4.6  --   HGB 12.8* 14.3  HCT 36.5* 42.0  MCV 104.0*  --   PLT 140*  --    Basic Metabolic Panel: Recent Labs  Lab 07/24/17 2218 07/24/17 2337  NA 134* 135  K 4.9 4.2  CL 102 108  CO2 24  --   GLUCOSE 101* 95  BUN 12 10  CREATININE 1.75* 1.90*  CALCIUM 8.2*  --    GFR: CrCl cannot be calculated (Unknown ideal weight.). Liver Function Tests: Recent Labs  Lab 07/24/17 2218  AST 63*  ALT 14*  ALKPHOS 124  BILITOT 1.7*  PROT 7.6  ALBUMIN 2.7*   No results for input(s): LIPASE, AMYLASE in the last 168 hours. No results for input(s): AMMONIA in the last 168 hours. Coagulation Profile: No results for input(s): INR, PROTIME in the last 168 hours. Cardiac Enzymes: No results for input(s): CKTOTAL, CKMB, CKMBINDEX, TROPONINI in the last 168 hours. BNP (last 3 results) No results for input(s): PROBNP in the last 8760 hours. HbA1C: No results for input(s): HGBA1C in the last 72 hours. CBG: No results for input(s): GLUCAP in the last 168 hours. Lipid Profile: No results for input(s): CHOL, HDL, LDLCALC, TRIG, CHOLHDL, LDLDIRECT in the last 72 hours. Thyroid Function Tests: No results for input(s): TSH, T4TOTAL, FREET4, T3FREE, THYROIDAB in the last 72 hours. Anemia Panel: No results for input(s): VITAMINB12, FOLATE, FERRITIN, TIBC, IRON, RETICCTPCT in the last 72 hours. Urine analysis:    Component Value Date/Time   COLORURINE YELLOW 07/24/2017 2218   APPEARANCEUR CLEAR 07/24/2017 2218   LABSPEC 1.014 07/24/2017 2218   PHURINE 5.0 07/24/2017 2218   GLUCOSEU NEGATIVE 07/24/2017 2218   HGBUR NEGATIVE 07/24/2017 2218   BILIRUBINUR NEGATIVE 07/24/2017 2218   KETONESUR NEGATIVE 07/24/2017 2218   PROTEINUR NEGATIVE 07/24/2017 2218   UROBILINOGEN 1.0 06/14/2014 1847   NITRITE NEGATIVE 07/24/2017 2218   LEUKOCYTESUR  NEGATIVE 07/24/2017 2218   Sepsis Labs: @LABRCNTIP (procalcitonin:4,lacticidven:4) )No results found for this or any previous visit (from the past 240 hour(s)).   Radiological Exams on Admission: Dg Chest 2 View  Result Date: 07/24/2017 CLINICAL DATA:  Hypotension EXAM: CHEST - 2 VIEW COMPARISON:  06/03/2017 chest radiograph. FINDINGS: Stable cardiomediastinal silhouette with normal heart size. Moderate to large left pneumothorax. No right pneumothorax. No mediastinal shift. Small left pleural effusion. No right pleural effusion. No pulmonary edema. Mild patchy left lung base opacity. Clear right lung. IMPRESSION: Moderate to large left hydropneumothorax with small left pleural effusion component. No mediastinal shift. Mild patchy left lung base opacity, probably atelectasis. Critical Value/emergent results were called by telephone at the time of interpretation on 07/24/2017 at 8:57 pm to Dr. Kristine Royal , who verbally acknowledged these results. Electronically Signed   By: Delbert Phenix M.D.   On: 07/24/2017 20:58   Dg Chest Port 1 View  Result Date: 07/24/2017 CLINICAL DATA:  Chest tube EXAM: PORTABLE CHEST 1 VIEW COMPARISON:  07/24/2017, 06/03/2017 FINDINGS: Insertion of left-sided chest tube with pigtail visible over the mid to lower chest. Moderate to large left hydropneumothorax with increased size of the pneumothorax. No midline shift. Airspace disease at the left lung base. Stable cardiomediastinal silhouette. IMPRESSION: Insertion of left-sided chest tube with pigtail projecting over the left mid chest. Apparent interval increase in size of left pneumothorax. Airspace disease at the left base which may reflect atelectasis or a pneumonia. Electronically Signed   By: Jasmine Pang M.D.   On: 07/24/2017 23:50     Assessment/Plan Principal Problem:   Spontaneous pneumothorax Active Problems:   Chronic pancreatitis (HCC)   AKI (acute kidney injury) (HCC)   Alcohol abuse   Thrombocytopenia  (HCC)   Hypotension   Seizure (HCC)    1. Spontaneous pneumothorax/hydropneumothorax -pulmonary critical care and cardiothoracic surgeons were consulted by ER physician.  At this time chest tube has been placed.  Pulmonary critical care will be following as consult.  Since repeat chest x-ray done after chest tube placed shows possibility of pneumonia patient has been placed on empiric antibiotics  for community-acquired pneumonia. 2. Hypotension likely from patient taking more than required anti-hypertensives.  Patient took double the dose of her antihypertensives.  Improved with fluids. 3. Acute renal failure likely from hypotension and patient taking double the dose of lisinopril -for now holding lisinopril and continue to hydrate. 4. History of hypertension -initially hypotensive blood pressure is improving now.  For now we will keep patient on PRN IV hydralazine for systolic blood pressure more than 160.  Closely follow blood pressure trends. 5. Macrocytic anemia -could be from alcoholism.  Check anemia panel follow CBC. 6. Thrombocytopenia likely from alcoholism -follow CBC. 7. History of alcohol abuse -will place patient on CIWA protocol. 8. Tobacco abuse -strongly advised to quit smoking.  9. History of seizures on Keppra.   DVT prophylaxis: SCDs for now. Code Status: Full code. Family Communication: Discussed with patient. Disposition Plan: Home. Consults called: Pulmonary critical care and cardiothoracic surgery. Admission status: Inpatient.   Eduard Clos MD [facility] Pager [phone].  If 7PM-7AM, please contact night-coverage [email or web address] Password [facility]  07/25/2017, 2:38 AM

## 2017-07-25 NOTE — Progress Notes (Signed)
PROGRESS NOTE    Patient: Dennis Zhang     PCP: [facility]                    DOB: 08-Jan-1956            DOA: 07/24/2017 ZOX:[id number]             DOS: 07/25/2017, 1:44 PM   LOS: 0 days   Date of Service: The patient was seen and examined on 07/25/2017  Subjective:  Patient was seen and examined this morning.  Still in ED.  Chest tube was secured in the chest wall.  Repeated chest x-ray reviewed.  Patient is currently stable in no acute distress   chest x-ray also was consistent with possible pneumonia along with a left-sided improving Hydro pneumothorax.  He is currently stable inquiring if he can possibly take anything orally.  ----------------------------------------------------------------------------------------------------------------------  Brief Narrative:   This is a 62 year old African-American male with extensive history of hypertension, seizure disorder, alcohol abuse, chronic anemia presented with generalized weakness, shortness of breath noted for hypotension, elevated lactate and acute large left sided hydropneumothorax.  Status post chest tube placement in ED. Cardiothoracic team Dr.Gerhardt was consulted by ED staff    Principal Problem:   Spontaneous pneumothorax Active Problems:   Chronic pancreatitis (HCC)   AKI (acute kidney injury) (HCC)   Alcohol abuse   Thrombocytopenia (HCC)   Hypotension   Seizure (HCC)   Assessment & Plan:   Spontaneous pneumothorax/hydropneumothorax  -Monitor closely, chest tube in place, satting greater than 90%  -pulmonary critical care and cardiothoracic surgeons were consulted by ER physician.  At this time chest tube has been placed.  Pulmonary critical care will be following as consult.   Possible pneumonia-  Since repeat chest x-ray done after chest tube placed shows possibility of pneumonia patient has been placed on empiric antibiotics for community-acquired pneumonia.  Hypotension  -likely from patient  taking more than required anti-hypertensives.  Patient took double the dose of her antihypertensives.  Improved with fluids.  Acute renal failure likely from hypotension and patient taking double the dose of lisinopril  -for now holding lisinopril and continue to hydrate.  History of hypertension -Was hypotensive on admission, improving -cont. on PRN IV hydralazine for systolic blood pressure more than 160.   Closely follow blood pressure trends.  Macrocytic anemia  -could be from alcoholism.  Check anemia panel follow CBC. Thrombocytopenia likely from alcoholism -follow CBC.  History of alcohol abuse -will place patient on CIWA protocol.  Tobacco abuse -strongly advised to quit smoking.   History of seizures on Keppra.   DVT prophylaxis: SCDs for now. Code Status: Full code. Family Communication: Discussed with patient. Disposition Plan: Home. Consults called: Pulmonary critical care and cardiothoracic surgery. Admission status: Inpatient.      Antimicrobials:  Anti-infectives (From admission, onward)   Start     Dose/Rate Route Frequency Ordered Stop   07/25/17 0245  levofloxacin (LEVAQUIN) IVPB 750 mg     750 mg 100 mL/hr over 90 Minutes Intravenous Daily 07/25/17 0237 07/30/17 0559       Objective: Vitals:   07/25/17 0700 07/25/17 0730 07/25/17 1100 07/25/17 1130  BP: (!) 151/95 (!) 138/93 (!) 131/97 135/89  Pulse: 85 79 86 82  Resp: (!) 26 16 (!) 21 16  Temp:      TempSrc:      SpO2: 100% 100% 100% 100%   No intake or output data in the 24 hours ending  07/25/17 1344 There were no vitals filed for this visit.  Examination:  General exam: Appears calm and comfortable, breathing comfortably, chest tube placed is present on the left side of her chest wall Psychiatry: Judgement and insight appear normal. Mood & affect appropriate. HEENT: WNLs Respiratory system: Poor air exchange in the lower lobe to middle lobe of the left chest wall , chest tube is in  the left chest wall c Otherwise clear to auscultation, minimal respiratory effort  Cardiovascular system: S1 & S2 heard, RRR. No JVD, murmurs, rubs, gallops or clicks. No pedal edema. Gastrointestinal system: Abd. nondistended, soft and nontender. No organomegaly or masses felt. Normal bowel sounds heard. Central nervous system: Alert and oriented. No focal neurological deficits. Extremities: Symmetric 5 x 5 power. Skin: No rashes, lesions or ulcers Wounds:   Data Reviewed: I have personally reviewed following labs and imaging studies  CBC: Recent Labs  Lab 07/24/17 2022 07/24/17 2337  WBC 4.6  --   HGB 12.8* 14.3  HCT 36.5* 42.0  MCV 104.0*  --   PLT 140*  --    Basic Metabolic Panel: Recent Labs  Lab 07/24/17 2218 07/24/17 2337  NA 134* 135  K 4.9 4.2  CL 102 108  CO2 24  --   GLUCOSE 101* 95  BUN 12 10  CREATININE 1.75* 1.90*  CALCIUM 8.2*  --    GFR: CrCl cannot be calculated (Unknown ideal weight.). Liver Function Tests: Recent Labs  Lab 07/24/17 2218  AST 63*  ALT 14*  ALKPHOS 124  BILITOT 1.7*  PROT 7.6  ALBUMIN 2.7*  Anemia Panel: Recent Labs    07/25/17 0834  VITAMINB12 687  FOLATE 13.2  FERRITIN 512*  TIBC 179*  IRON 157  RETICCTPCT 1.4   Sepsis Labs: Recent Labs  Lab 07/24/17 2041 07/24/17 2336  LATICACIDVEN 2.79* 1.77    No results found for this or any previous visit (from the past 240 hour(s)).    Radiology Studies: Dg Chest 2 View  Result Date: 07/24/2017 CLINICAL DATA:  Hypotension EXAM: CHEST - 2 VIEW COMPARISON:  06/03/2017 chest radiograph. FINDINGS: Stable cardiomediastinal silhouette with normal heart size. Moderate to large left pneumothorax. No right pneumothorax. No mediastinal shift. Small left pleural effusion. No right pleural effusion. No pulmonary edema. Mild patchy left lung base opacity. Clear right lung. IMPRESSION: Moderate to large left hydropneumothorax with small left pleural effusion component. No mediastinal  shift. Mild patchy left lung base opacity, probably atelectasis. Critical Value/emergent results were called by telephone at the time of interpretation on 07/24/2017 at 8:57 pm to Dr. Kristine Royal , who verbally acknowledged these results. Electronically Signed   By: Delbert Phenix M.D.   On: 07/24/2017 20:58   Dg Chest Port 1 View  Result Date: 07/25/2017 CLINICAL DATA:  Pneumothorax. EXAM: PORTABLE CHEST 1 VIEW COMPARISON:  Radiograph of July 24, 2017. FINDINGS: Stable cardiomediastinal silhouette. Stable position of left-sided pigtail chest tube is noted. Pneumothorax noted on prior exam is significantly smaller, with mild apical and basilar components remaining. Mild right basilar subsegmental atelectasis is noted. Small left pleural effusion may be present. Bony thorax is unremarkable. IMPRESSION: Significantly improved left pneumothorax is noted. Stable position of left-sided chest tube. Minimal left pleural effusion is noted. Mild right basilar subsegmental atelectasis is noted. Electronically Signed   By: Lupita Raider, M.D.   On: 07/25/2017 09:44   Dg Chest Port 1 View  Result Date: 07/24/2017 CLINICAL DATA:  Chest tube EXAM: PORTABLE CHEST 1 VIEW  COMPARISON:  07/24/2017, 06/03/2017 FINDINGS: Insertion of left-sided chest tube with pigtail visible over the mid to lower chest. Moderate to large left hydropneumothorax with increased size of the pneumothorax. No midline shift. Airspace disease at the left lung base. Stable cardiomediastinal silhouette. IMPRESSION: Insertion of left-sided chest tube with pigtail projecting over the left mid chest. Apparent interval increase in size of left pneumothorax. Airspace disease at the left base which may reflect atelectasis or a pneumonia. Electronically Signed   By: Jasmine PangKim  Fujinaga M.D.   On: 07/24/2017 23:50    Scheduled Meds: . feeding supplement (ENSURE ENLIVE)  237 mL Oral TID BM  . folic acid  1 mg Oral Daily  . levETIRAcetam  500 mg Oral BID  .  magnesium oxide  400 mg Oral Daily  . multivitamin with minerals  1 tablet Oral Daily  . thiamine  100 mg Oral Daily   Or  . thiamine  100 mg Intravenous Daily   Continuous Infusions: . sodium chloride 75 mL/hr at 07/25/17 0356  . levofloxacin (LEVAQUIN) IV Stopped (07/25/17 1221)    Time spent: >25 minutes  Kendell BaneSeyed A Shanquita Ronning, MD [facility],  Pager [phone]  If 7PM-7AM, please contact night-coverage [email or web address]   Password [email or web address]  07/25/2017, 1:44 PM

## 2017-07-26 ENCOUNTER — Inpatient Hospital Stay (HOSPITAL_COMMUNITY): Payer: Non-veteran care

## 2017-07-26 DIAGNOSIS — J9383 Other pneumothorax: Secondary | ICD-10-CM

## 2017-07-26 LAB — BASIC METABOLIC PANEL
Anion gap: 8 (ref 5–15)
BUN: 8 mg/dL (ref 6–20)
CALCIUM: 8.6 mg/dL — AB (ref 8.9–10.3)
CHLORIDE: 101 mmol/L (ref 101–111)
CO2: 27 mmol/L (ref 22–32)
Creatinine, Ser: 1.21 mg/dL (ref 0.61–1.24)
Glucose, Bld: 106 mg/dL — ABNORMAL HIGH (ref 65–99)
Potassium: 4.4 mmol/L (ref 3.5–5.1)
SODIUM: 136 mmol/L (ref 135–145)

## 2017-07-26 LAB — CBC
HCT: 40.3 % (ref 39.0–52.0)
HEMOGLOBIN: 14.1 g/dL (ref 13.0–17.0)
MCH: 36.3 pg — AB (ref 26.0–34.0)
MCHC: 35 g/dL (ref 30.0–36.0)
MCV: 103.9 fL — ABNORMAL HIGH (ref 78.0–100.0)
PLATELETS: 93 10*3/uL — AB (ref 150–400)
RBC: 3.88 MIL/uL — ABNORMAL LOW (ref 4.22–5.81)
RDW: 14.3 % (ref 11.5–15.5)
WBC: 4.3 10*3/uL (ref 4.0–10.5)

## 2017-07-26 LAB — HIV ANTIBODY (ROUTINE TESTING W REFLEX): HIV SCREEN 4TH GENERATION: NONREACTIVE

## 2017-07-26 MED ORDER — AMLODIPINE BESYLATE 5 MG PO TABS
5.0000 mg | ORAL_TABLET | Freq: Every day | ORAL | Status: DC
  Administered 2017-07-26 – 2017-07-28 (×3): 5 mg via ORAL
  Filled 2017-07-26 (×3): qty 1

## 2017-07-26 NOTE — Progress Notes (Signed)
Name: Dennis Zhang MRN: [id number] DOB: 1955-11-22    ADMISSION DATE:  07/24/2017 CONSULTATION DATE:  07/25/17  REFERRING MD :  Dr. Flossie Dibble / [facility]   CHIEF COMPLAINT:  Pneumothorax   HISTORY OF PRESENT ILLNESS:  62 y/o M, smoker (5 cigs per day for 32 years) who presented to [facility] ER on 3/5 with reports of dizziness and falls.    The patient reports he has been feeling bad for approximately 5 days.  He states he has been fatigued, weak, dizzy and cough with clear sputum production. He activated EMS where he was found to be hypotensive with SBP of 60.  The patient stated his blood pressure was high early in the am on 3/6 and he took an extra dose of lisinopril prior to coming to the ER.  He states the cough has been so bad that it hurt.    The patient was found to have a spontaneous left pneumothorax.  Chest tube was placed in the ER.  Per notes, "70 ml of yellow chylous fluid was drained" upon placement. Despite chest tube placement, he had residual pneumothorax on follow up film.  It is unclear if chest tube was on suction on the follow up film.    PCCM consulted for evaluation of pneumothorax.    He reports he does drink alcohol occasionally. He has never had shaking / withdrawal symptoms before.    PAST MEDICAL HISTORY :   has a past medical history of Arthritis, Chronic pain, Chronic pain, Diabetes mellitus without complication (HCC), Hyperlipidemia, Hypertension, and Seizures (HCC).   has a past surgical history that includes Abdominal surgery; Hemorroidectomy; and Appendectomy.  Prior to Admission medications   Medication Sig Start Date End Date Taking? Authorizing Provider  folic acid (FOLVITE) 1 MG tablet Take 1 tablet (1 mg total) by mouth daily. 05/24/16  Yes Albertine Grates, MD  gabapentin (NEURONTIN) 300 MG capsule Take 1 capsule (300 mg total) by mouth 3 (three) times daily. Patient taking differently: Take 300 mg by mouth 4 (four) times daily as needed (for nerve pain.).  07/31/12  Yes  Schinlever, Santina Evans, PA-C  lisinopril (PRINIVIL,ZESTRIL) 10 MG tablet Take 5 mg by mouth 2 (two) times daily.   Yes [provider]  Magnesium Oxide 420 MG TABS Take 420 mg by mouth daily.    Yes [provider]  Naphazoline HCl (CLEAR EYES OP) Place 1 drop into both eyes daily.   Yes [provider]  oxyCODONE-acetaminophen (PERCOCET) 10-325 MG tablet Take 1 tablet by mouth every 6 (six) hours as needed for pain.   Yes [provider]  acetaminophen (TYLENOL) 500 MG tablet Take 500 mg by mouth 3 (three) times daily as needed (for pain.).     [provider]  feeding supplement, ENSURE COMPLETE, (ENSURE COMPLETE) LIQD Take 237 mLs by mouth 3 (three) times daily between meals. 06/16/14   Rhetta Mura, MD  levETIRAcetam (KEPPRA) 500 MG tablet Take 1 tablet (500 mg total) by mouth 2 (two) times daily. 06/13/16   Elson Areas, PA-C  levETIRAcetam (KEPPRA) 500 MG tablet Take 1 tablet (500 mg total) by mouth 2 (two) times daily. Patient not taking: Reported on 08/03/2016 08/02/16   Renne Musca, MD  naproxen (NAPROSYN) 375 MG tablet Take 1 tablet (375 mg total) by mouth 2 (two) times daily. Patient not taking: Reported on 07/24/2017 07/26/16   Trixie Dredge, PA-C  pantoprazole (PROTONIX) 40 MG tablet Take 1 tablet (40 mg total) by mouth daily. Patient  taking differently: Take 40 mg by mouth daily as needed (indigestion).  05/24/16   Albertine GratesXu, Fang, MD  tamsulosin (FLOMAX) 0.4 MG CAPS capsule Take 1 capsule (0.4 mg total) by mouth daily after breakfast. Patient not taking: Reported on 08/03/2016 05/23/16   Albertine GratesXu, Fang, MD  thiamine 100 MG tablet Take 1 tablet (100 mg total) by mouth daily. Patient not taking: Reported on 08/03/2016 05/24/16   Albertine GratesXu, Fang, MD  traMADol (ULTRAM) 50 MG tablet Take 1 tablet (50 mg total) by mouth every 6 (six) hours as needed for moderate pain. Patient not taking: Reported on 07/24/2017 05/23/16   Albertine GratesXu, Fang, MD    Allergies  Allergen Reactions    . Ace Inhibitors     Kidney injury 05/2014-do not Rx  . Aspirin Nausea Only and Other (See Comments)    Reaction to Bayer aspirin - causes acid reflux and nausea  . Tylenol [Acetaminophen] Nausea Only    States can take Tylenol if has other pain med w/it - like Hydrocodone     FAMILY HISTORY:  family history includes Heart failure in his brother; Hypertension in his mother; Migraines in his brother and sister.  SOCIAL HISTORY:  reports that he has been smoking cigarettes.  He has a 15.00 pack-year smoking history. he has never used smokeless tobacco. He reports that he drinks alcohol. He reports that he does not use drugs.  REVIEW OF SYSTEMS:  POSITIVES IN BOLD Constitutional: Negative for fever, chills, weight loss, malaise/fatigue and diaphoresis.  HENT: Negative for hearing loss, ear pain, nosebleeds, congestion, sore throat, neck pain, tinnitus and ear discharge.   Eyes: Negative for blurred vision, double vision, photophobia, pain, discharge and redness.  Respiratory: Negative for cough with clear sputum production, hemoptysis, sputum production, shortness of breath, wheezing and stridor.   Cardiovascular: Negative for chest pain, palpitations, orthopnea, claudication, leg swelling and PND.  Gastrointestinal: Negative for heartburn, nausea, vomiting, abdominal pain, diarrhea, constipation, blood in stool and melena.  Genitourinary: Negative for dysuria, urgency, frequency, hematuria and flank pain.  Musculoskeletal: Negative for myalgias, back pain, joint pain and falls.  Skin: Negative for itching and rash.  Neurological: Negative for dizziness, tingling, tremors, sensory change, speech change, focal weakness, seizures, loss of consciousness, weakness and headaches.  Endo/Heme/Allergies: Negative for environmental allergies and polydipsia. Does not bruise/bleed easily.   SUBJECTIVE:  Feels well with no complaints.  No acute events overnight.  VITAL SIGNS: Temp:  [97.8 F (36.6  C)-98.7 F (37.1 C)] 98.4 F (36.9 C) (03/07 1439) Pulse Rate:  [79-86] 85 (03/07 1439) Resp:  [18-20] 18 (03/07 1439) BP: (103-163)/(68-97) 153/96 (03/07 1439) SpO2:  [98 %-100 %] 100 % (03/07 1439) Weight:  [143 lb 4.8 oz (65 kg)-143 lb 11.2 oz (65.2 kg)] 143 lb 4.8 oz (65 kg) (03/07 0800)  PHYSICAL EXAMINATION: Gen:      No acute distress HEENT:  EOMI, sclera anicteric Neck:     No masses; no thyromegaly Lungs:    Clear to auscultation bilaterally; normal respiratory effort CV:         Regular rate and rhythm; no murmurs Abd:      + bowel sounds; soft, non-tender; no palpable masses, no distension Ext:    No edema; adequate peripheral perfusion Skin:      Warm and dry; no rash Neuro: alert and oriented x 3 Psych: normal mood and affect  Recent Labs  Lab 07/24/17 2218 07/24/17 2337 07/26/17 0614  NA 134* 135 136  K 4.9 4.2 4.4  CL 102 108 101  CO2 24  --  27  BUN 12 10 8   CREATININE 1.75* 1.90* 1.21  GLUCOSE 101* 95 106*    Recent Labs  Lab 07/24/17 2022 07/24/17 2337 07/26/17 0614  HGB 12.8* 14.3 14.1  HCT 36.5* 42.0 40.3  WBC 4.6  --  4.3  PLT 140*  --  93*    SIGNIFICANT EVENTS  3/05  Admit with spontaneous PTX  STUDIES Chest x-ray 07/24/17-large left hydropneumothorax Chest x-ray 07/25/17-placement of left chest tube.  Improvement in pneumothorax.  Chest x-ray 07/26/17- stable portion of chest tube.  Continued improvement in pneumothorax Reviewed the images personally  CULTURES   ANTIBIOTICS     ASSESSMENT / PLAN: Discussion: 63 y/o M, smoker, admitted with left pneumothorax.  Recent falls and URI symptoms.    Left Spontaneous Pneumothorax  Small Left Effusion - clear yellow pleural fluid drained upon insertion of chest tube.  Not chylous fluid.   Mild Right Basilar Atelectasis Tobacco Abuse   Plan: No airleak noted today.  Place chest tube to waterseal Follow-up chest x-ray today evening and tomorrow If stable then will probably remove chest  tube tomorrow  Chilton Greathouse MD [facility] Pager [phone] If no answer or after 3pm call: [phone] 07/26/2017, 3:55 PM

## 2017-07-26 NOTE — Progress Notes (Signed)
PROGRESS NOTE    Patient: Dennis Zhang     PCP: [facility]                    DOB: 1956-02-22            DOA: 07/24/2017 OZH:[id number]             DOS: 07/26/2017, 4:00 PM   LOS: 1 day   Date of Service: The patient was seen and examined on 07/26/2017  Subjective:  Was seen and examined this morning, more sleepy than usual.  No major issues overnight. Nursing staff reporting downward holding excessive pain medication administration secondary to his drowsiness. Chest tube still in place on the left side Eyes any acute chest pain or shortness of breath.  ----------------------------------------------------------------------------------------------------------------------  Brief Narrative:   This is a 62 year old African-American male with extensive history of hypertension, seizure disorder, alcohol abuse, chronic anemia presented with generalized weakness, shortness of breath noted for hypotension, elevated lactate and acute large left sided hydropneumothorax.  Status post chest tube placement in ED. Cardiothoracic team Dr.Gerhardt was consulted by ED staff    Principal Problem:   Spontaneous pneumothorax Active Problems:   Chronic pancreatitis (HCC)   AKI (acute kidney injury) (HCC)   Alcohol abuse   Thrombocytopenia (HCC)   Hypotension   Seizure (HCC)   Assessment & Plan:   Spontaneous pneumothorax/hydropneumothorax  -Monitoring closely, chest tube in place, O2 via nasal cannula satting greater than 92% -Pulmonary critical care team following, planning to place the chest tube to waterseal today, follow-up chest x-ray  Possible pneumonia- -follow with blood cultures accordingly, continue IV antibiotics  Hypotension  -Proving -likely from patient taking more than required anti-hypertensives.  Patient took double the dose of her antihypertensives.  Improved with fluids.  Acute renal failure likely from hypotension and patient taking double the dose of  lisinopril  -for now holding lisinopril and continue to hydrate.  History of hypertension -Was hypotensive on admission, improving -cont. on PRN IV hydralazine for systolic blood pressure more than 160.   Closely follow blood pressure trends. -His blood pressure continue to improve, will add Norvasc at 5 mg daily  Macrocytic anemia  -could be from alcoholism.  Check anemia panel follow CBC. Thrombocytopenia likely from alcoholism -follow CBC.  History of alcohol abuse -will place patient on CIWA protocol.  Tobacco abuse -strongly advised to quit smoking.   History of seizures on Keppra.   DVT prophylaxis: SCDs for now. Code Status: Full code. Family Communication: Discussed with patient. Disposition Plan: Home. Consults called: Pulmonary critical care and cardiothoracic surgery. Admission status: Inpatient.      Antimicrobials:  Anti-infectives (From admission, onward)   Start     Dose/Rate Route Frequency Ordered Stop   07/25/17 0245  levofloxacin (LEVAQUIN) IVPB 750 mg     750 mg 100 mL/hr over 90 Minutes Intravenous Daily 07/25/17 0237 07/30/17 0559       Objective: Vitals:   07/26/17 0441 07/26/17 0500 07/26/17 0800 07/26/17 1439  BP: (!) 144/90   (!) 153/96  Pulse: 86   85  Resp:    18  Temp: 98.7 F (37.1 C)   98.4 F (36.9 C)  TempSrc: Oral   Oral  SpO2: 98%   100%  Weight:  65.2 kg (143 lb 11.2 oz) 65 kg (143 lb 4.8 oz)   Height:   6\' 5"  (1.956 m)     Intake/Output Summary (Last 24 hours) at 07/26/2017 1600 Last data filed  at 07/26/2017 1141 Gross per 24 hour  Intake 300 ml  Output 250 ml  Net 50 ml   Filed Weights   07/26/17 0500 07/26/17 0800  Weight: 65.2 kg (143 lb 11.2 oz) 65 kg (143 lb 4.8 oz)    Examination:  BP (!) 153/96 (BP Location: Left Arm)   Pulse 85   Temp 98.4 F (36.9 C) (Oral)   Resp 18   Ht 6\' 5"  (1.956 m)   Wt 65 kg (143 lb 4.8 oz)   SpO2 100%   BMI 16.99 kg/m    Physical Exam  Constitution: Sleepy, able to  arouse easily, follows command appropriately   HEENT: Normocephalic, PERRL, otherwise with in Normal limits  Cardio vascular:  S1/S2, RRR, No murmure, No Rubs or Gallops  Chest/pulmonary: Left mid to lower level lung field diffuse breath sounds diminished, chest tube in place on the left lateral chest wall.  If any crackles, negative any wheezes , breath sounds throughout the right lung field Abdomen: Soft, non-tender, non-distended, bowel sounds,no masses, no organomegaly Muscular skeletal: Limited exam - in bed, able to move all 4 extremities, Normal strength,  Neuro: CNII-XII intact. , normal motor and sensation, reflexes intact  Extremities: No pitting edema lower extremities, +2 pulses  Skin: Dry, warm to touch, negative for any Rashes, No open wounds  left chest wall chest tube placed    Data Reviewed: I have personally reviewed following labs and imaging studies  CBC: Recent Labs  Lab 07/24/17 2022 07/24/17 2337 07/26/17 0614  WBC 4.6  --  4.3  HGB 12.8* 14.3 14.1  HCT 36.5* 42.0 40.3  MCV 104.0*  --  103.9*  PLT 140*  --  93*   Basic Metabolic Panel: Recent Labs  Lab 07/24/17 2218 07/24/17 2337 07/26/17 0614  NA 134* 135 136  K 4.9 4.2 4.4  CL 102 108 101  CO2 24  --  27  GLUCOSE 101* 95 106*  BUN 12 10 8   CREATININE 1.75* 1.90* 1.21  CALCIUM 8.2*  --  8.6*   GFR: Estimated Creatinine Clearance: 58.2 mL/min (by C-G formula based on SCr of 1.21 mg/dL). Liver Function Tests: Recent Labs  Lab 07/24/17 2218  AST 63*  ALT 14*  ALKPHOS 124  BILITOT 1.7*  PROT 7.6  ALBUMIN 2.7*  Anemia Panel: Recent Labs    07/25/17 0834  VITAMINB12 687  FOLATE 13.2  FERRITIN 512*  TIBC 179*  IRON 157  RETICCTPCT 1.4   Sepsis Labs: Recent Labs  Lab 07/24/17 2041 07/24/17 2336  LATICACIDVEN 2.79* 1.77    Recent Results (from the past 240 hour(s))  Culture, blood (routine x 2)     Status: None (Preliminary result)   Collection Time: 07/24/17  8:35 PM  Result  Value Ref Range Status   Specimen Description   Final    BLOOD LEFT ANTECUBITAL Performed at [facility], [address] [place], [place] [address]    Special Requests   Final    IN PEDIATRIC BOTTLE Blood Culture adequate volume Performed at [facility], [address], [place], [place] [address]    Culture   Final    NO GROWTH 2 DAYS Performed at [facility], [address], [place], [place] [address]    Report Status PENDING  Incomplete  Culture, blood (routine x 2)     Status: None (Preliminary result)   Collection Time: 07/24/17 10:18 PM  Result Value Ref Range Status  Specimen Description   Final    BLOOD RIGHT ANTECUBITAL Performed at [facility], [address] [place], [place] [address]    Special Requests   Final    IN PEDIATRIC BOTTLE Blood Culture adequate volume Performed at [facility], [address], [place], [place] [address]    Culture   Final    NO GROWTH 2 DAYS Performed at [facility], [address], [place], [place] [address]    Report Status PENDING  Incomplete      Radiology Studies: Dg Chest 2 View  Result Date: 07/24/2017 CLINICAL DATA:  Hypotension EXAM: CHEST - 2 VIEW COMPARISON:  06/03/2017 chest radiograph. FINDINGS: Stable cardiomediastinal silhouette with normal heart size. Moderate to large left pneumothorax. No right pneumothorax. No mediastinal shift. Small left pleural effusion. No right pleural effusion. No pulmonary edema. Mild patchy left lung base opacity. Clear right lung. IMPRESSION: Moderate to large left hydropneumothorax with small left pleural effusion component. No mediastinal shift. Mild patchy left lung base opacity, probably atelectasis. Critical Value/emergent results were called by telephone at the time of interpretation on 07/24/2017 at 8:57 pm to Dr. Kristine Royal , who verbally acknowledged these results. Electronically Signed   By:  Delbert Phenix M.D.   On: 07/24/2017 20:58   Dg Chest Port 1 View  Result Date: 07/26/2017 CLINICAL DATA:  Pneumothorax EXAM: PORTABLE CHEST 1 VIEW COMPARISON:  07/25/2017 FINDINGS: Left chest tube remains in place. Small left apical pneumothorax, decreased when compared to prior study, less than 5%. Bibasilar atelectasis. Heart is borderline in size. IMPRESSION: Decreasing size of the left pneumothorax, now noted at the left apex only, less than 5%. Left base atelectasis. Electronically Signed   By: Charlett Nose M.D.   On: 07/26/2017 09:29   Dg Chest Port 1 View  Result Date: 07/25/2017 CLINICAL DATA:  Pneumothorax. EXAM: PORTABLE CHEST 1 VIEW COMPARISON:  Radiograph of July 24, 2017. FINDINGS: Stable cardiomediastinal silhouette. Stable position of left-sided pigtail chest tube is noted. Pneumothorax noted on prior exam is significantly smaller, with mild apical and basilar components remaining. Mild right basilar subsegmental atelectasis is noted. Small left pleural effusion may be present. Bony thorax is unremarkable. IMPRESSION: Significantly improved left pneumothorax is noted. Stable position of left-sided chest tube. Minimal left pleural effusion is noted. Mild right basilar subsegmental atelectasis is noted. Electronically Signed   By: Lupita Raider, M.D.   On: 07/25/2017 09:44   Dg Chest Port 1 View  Result Date: 07/24/2017 CLINICAL DATA:  Chest tube EXAM: PORTABLE CHEST 1 VIEW COMPARISON:  07/24/2017, 06/03/2017 FINDINGS: Insertion of left-sided chest tube with pigtail visible over the mid to lower chest. Moderate to large left hydropneumothorax with increased size of the pneumothorax. No midline shift. Airspace disease at the left lung base. Stable cardiomediastinal silhouette. IMPRESSION: Insertion of left-sided chest tube with pigtail projecting over the left mid chest. Apparent interval increase in size of left pneumothorax. Airspace disease at the left base which may reflect atelectasis or a  pneumonia. Electronically Signed   By: Jasmine Pang M.D.   On: 07/24/2017 23:50    Scheduled Meds: . feeding supplement (ENSURE ENLIVE)  237 mL Oral TID BM  . folic acid  1 mg Oral Daily  . levETIRAcetam  500 mg Oral BID  . magnesium oxide  400 mg Oral Daily  . multivitamin with minerals  1 tablet Oral Daily  . thiamine  100 mg Oral Daily   Or  .  thiamine  100 mg Intravenous Daily   Continuous Infusions: . levofloxacin (LEVAQUIN) IV 750 mg (07/26/17 0509)    Time spent: >25 minutes  Kendell BaneSeyed A Eleana Tocco, MD [facility],  Pager [phone]  If 7PM-7AM, please contact night-coverage [email or web address]   Password [email or web address]  07/26/2017, 4:00 PM

## 2017-07-26 NOTE — Progress Notes (Signed)
Water seal done on the chest tube. Patient tolerated well. Will continue to monitor patient.

## 2017-07-26 NOTE — Progress Notes (Addendum)
Initial Nutrition Assessment  DOCUMENTATION CODES:   Underweight, Severe malnutrition in context of social or environmental circumstances  INTERVENTION:    Ensure Enlive po TID, each supplement provides 350 kcal and 20 grams of protein  Magic cup TID with meals, each supplement provides 290 kcal and 9 grams of protein  NUTRITION DIAGNOSIS:   Severe Malnutrition related to social / environmental circumstances(alcohol abuse) as evidenced by percent weight loss, moderate fat depletion, severe fat depletion, moderate muscle depletion, severe muscle depletion.  GOAL:   Patient will meet greater than or equal to 90% of their needs  MONITOR:   PO intake, Supplement acceptance, Weight trends, Labs  REASON FOR ASSESSMENT:   Other (Comment)(Low BMI)    ASSESSMENT:   Pt with PMH significant for HTN, seizure, alcohol abuse, and DM. Presents this admission with complaints of dizziness, productive cough, and vomiting 3 days PTA. Admitted for spontaneous pneumothorax/hydropneumothoarx. Chest tube in place at this time.    Patient on CIWA protocol and unable to provide much information. Willing to answer yes or no but would not elaborate on PO intake. Reports intake was okay prior to admission. MD notes pt continues to drink alcohol occasionally. Pt refused breakfast this morning due to increasing pain. Ensure was given but RD observed it untouched at bedside. Weight noted to be 159 lb 1/13 and 143 lb this admission (10% wt loss in two months, significant for time frame). Will continue to monitor/encourage PO intake. Nutrition-Focused physical exam completed.   Medications reviewed and include: folic acid, Mg oxide, MVI with minerals, thiamine, IV abx Labs reviewed: calcium ionized 0.80 (L)   NUTRITION - FOCUSED PHYSICAL EXAM:    Most Recent Value  Orbital Region  Moderate depletion  Upper Arm Region  Severe depletion  Thoracic and Lumbar Region  Unable to assess  Buccal Region  Moderate  depletion  Temple Region  Severe depletion  Clavicle Bone Region  Severe depletion  Clavicle and Acromion Bone Region  Severe depletion  Scapular Bone Region  Unable to assess  Dorsal Hand  Moderate depletion  Patellar Region  Severe depletion  Anterior Thigh Region  Severe depletion  Posterior Calf Region  Severe depletion  Edema (RD Assessment)  None  Hair  Reviewed  Eyes  Unable to assess  Mouth  Unable to assess  Skin  Reviewed  Nails  Reviewed     Diet Order:  Diet Heart Room service appropriate? Yes; Fluid consistency: Thin  EDUCATION NEEDS:   Not appropriate for education at this time  Skin:  Skin Assessment: Reviewed RN Assessment  Last BM:  07/24/17  Height:   Ht Readings from Last 1 Encounters:  07/26/17 6\' 5"  (1.956 m)    Weight:   Wt Readings from Last 1 Encounters:  07/26/17 143 lb 4.8 oz (65 kg)    Ideal Body Weight:  94.5 kg  BMI:  Body mass index is 16.99 kg/m.  Estimated Nutritional Needs:   Kcal:  2400-2600 kcal/day  Protein:  120-130 g/day  Fluid:  >2.4 L/day    Vanessa Kickarly Leolia Vinzant RD, LDN Clinical Nutrition Pager # - [phone]

## 2017-07-27 ENCOUNTER — Inpatient Hospital Stay (HOSPITAL_COMMUNITY): Payer: Non-veteran care

## 2017-07-27 LAB — BASIC METABOLIC PANEL
ANION GAP: 9 (ref 5–15)
BUN: 10 mg/dL (ref 6–20)
CALCIUM: 8.7 mg/dL — AB (ref 8.9–10.3)
CO2: 27 mmol/L (ref 22–32)
Chloride: 100 mmol/L — ABNORMAL LOW (ref 101–111)
Creatinine, Ser: 1.12 mg/dL (ref 0.61–1.24)
GFR calc Af Amer: >60 mL/min (ref >60)
GLUCOSE: 121 mg/dL — AB (ref 65–99)
Potassium: 4.1 mmol/L (ref 3.5–5.1)
SODIUM: 136 mmol/L (ref 135–145)

## 2017-07-27 LAB — CBC
HCT: 37 % — ABNORMAL LOW (ref 39.0–52.0)
Hemoglobin: 12.9 g/dL — ABNORMAL LOW (ref 13.0–17.0)
MCH: 36.3 pg — AB (ref 26.0–34.0)
MCHC: 34.9 g/dL (ref 30.0–36.0)
MCV: 104.2 fL — ABNORMAL HIGH (ref 78.0–100.0)
PLATELETS: 104 10*3/uL — AB (ref 150–400)
RBC: 3.55 MIL/uL — ABNORMAL LOW (ref 4.22–5.81)
RDW: 13.8 % (ref 11.5–15.5)
WBC: 5.3 10*3/uL (ref 4.0–10.5)

## 2017-07-27 LAB — LEGIONELLA PNEUMOPHILA SEROGP 1 UR AG: L. pneumophila Serogp 1 Ur Ag: NEGATIVE

## 2017-07-27 MED ORDER — LEVOFLOXACIN 750 MG PO TABS
750.0000 mg | ORAL_TABLET | Freq: Every day | ORAL | Status: DC
  Administered 2017-07-28: 750 mg via ORAL
  Filled 2017-07-27: qty 1

## 2017-07-27 MED ORDER — HYDROMORPHONE HCL 1 MG/ML IJ SOLN
1.0000 mg | INTRAMUSCULAR | Status: DC | PRN
  Administered 2017-07-27: 1 mg via INTRAVENOUS
  Filled 2017-07-27: qty 1

## 2017-07-27 MED ORDER — DIPHENHYDRAMINE HCL 25 MG PO CAPS
25.0000 mg | ORAL_CAPSULE | Freq: Four times a day (QID) | ORAL | Status: DC | PRN
  Administered 2017-07-27: 25 mg via ORAL
  Filled 2017-07-27: qty 1

## 2017-07-27 NOTE — Progress Notes (Signed)
PHARMACIST - PHYSICIAN COMMUNICATION DR:   Flossie DibbleShahmehdi CONCERNING: Antibiotic IV to Oral Route Change Policy  RECOMMENDATION: This patient is receiving Levaquin by the intravenous route.  Based on criteria approved by the Pharmacy and Therapeutics Committee, the antibiotic(s) is/are being converted to the equivalent oral dose form(s).  3/8: D3/5 Levaquin 750mg  q24hr. Afebrile, WBC wnl, Sats 99-100% on room air. Chest tube removed.  DESCRIPTION: These criteria include:  Patient being treated for a respiratory tract infection, urinary tract infection, cellulitis or clostridium difficile associated diarrhea if on metronidazole  The patient is not neutropenic and does not exhibit a GI malabsorption state  The patient is eating (either orally or via tube) and/or has been taking other orally administered medications for a least 24 hours  The patient is improving clinically and has a Tmax < 100.5  If you have questions about this conversion, please contact the Pharmacy Department  []   [phone]( [phone] )  [facility] []   [phone]( [phone] )  [facility] []   [phone]( [phone] )  [facility] []   [phone]( [phone] )  [facility] [x]   [phone]( [phone] )  [facility]       Otho BellowsGreen, Khalia Gong L PharmD Pager [phone] 07/27/2017, 12:14 PM .

## 2017-07-27 NOTE — Progress Notes (Signed)
PROGRESS NOTE    Patient: Dennis Zhang     [facility]                    DOB: 10/28/1955            DOA: 07/24/2017 [id number]             DOS: 07/27/2017, 10:29 AM   LOS: 2 days   Date of Service: The patient was seen and examined on 07/27/2017  Subjective:  She was seen and examined this morning, stable, no acute distress, no issues overnight.  Denies any chest pain or shortness of breath, reduced drainage from the left chest wall chest tube, currently in waterseal ----------------------------------------------------------------------------------------------------------------------  Brief Narrative:   This is a 62 year old African-American male with extensive history of hypertension, seizure disorder, alcohol abuse, chronic anemia presented with generalized weakness, shortness of breath noted for hypotension, elevated lactate and acute large left sided hydropneumothorax.  Status post chest tube placement in ED. Cardiothoracic team Dr.Gerhardt was consulted by ED staff    Principal Problem:   Spontaneous pneumothorax Active Problems:   Chronic pancreatitis (HCC)   AKI (acute kidney injury) (HCC)   Alcohol abuse   Thrombocytopenia (HCC)   Hypotension   Seizure (HCC)   Assessment & Plan:   Spontaneous pneumothorax/hydropneumothorax  -Proving, denies any shortness of breath or chest pain, chest tube still in place, on O2 via nasal cannula, no distress, chest tube is in waterseal -Monitoring closely,  -Pulmonary critical care team following, planning to place the chest tube to waterseal today, follow-up chest x-ray  Possible pneumonia- -follow with blood cultures accordingly, continue IV antibiotics  Hypotension  -Improving -likely from patient taking more than required anti-hypertensives.  Patient took double the dose of her antihypertensives.  Improved with fluids.  Acute renal failure likely from hypotension and patient taking double the dose of  lisinopril  -for now holding lisinopril and continue to hydrate.  History of hypertension -Was hypotensive on admission, improving -cont. on PRN IV hydralazine for systolic blood pressure more than 160.   Closely follow blood pressure trends. -His blood pressure continue to improve, will add Norvasc at 5 mg daily  Macrocytic anemia  -could be from alcoholism.  Check anemia panel follow CBC. Thrombocytopenia likely from alcoholism -follow CBC.  History of alcohol abuse  -on CIWA protocol.  Tobacco abuse  -strongly advised to quit smoking.   History of seizures  -on Keppra.   DVT prophylaxis: SCDs for now. Code Status: Full code. Family Communication: Discussed with patient. Disposition Plan: Home. Consults called: Pulmonary critical care and cardiothoracic surgery. Admission status: Inpatient.      Antimicrobials:  Anti-infectives (From admission, onward)   Start     Dose/Rate Route Frequency Ordered Stop   07/25/17 0245  levofloxacin (LEVAQUIN) IVPB 750 mg     750 mg 100 mL/hr over 90 Minutes Intravenous Daily 07/25/17 0237 07/30/17 0559       Objective: Vitals:   07/26/17 1439 07/26/17 2044 07/27/17 0455 07/27/17 0500  BP: (!) 153/96 (!) 139/98 (!) 132/93   Pulse: 85 97 (!) 101   Resp: 18 19 20    Temp: 98.4 F (36.9 C) 98.1 F (36.7 C) 98.4 F (36.9 C)   TempSrc: Oral Oral Oral   SpO2: 100% 100% 99%   Weight:    61 kg (134 lb 8 oz)  Height:        Intake/Output Summary (Last 24 hours) at 07/27/2017 1029 Last data filed  at 07/27/2017 0700 Gross per 24 hour  Intake 60 ml  Output 740 ml  Net -680 ml   Filed Weights   07/26/17 0500 07/26/17 0800 07/27/17 0500  Weight: 65.2 kg (143 lb 11.2 oz) 65 kg (143 lb 4.8 oz) 61 kg (134 lb 8 oz)    Examination: BP (!) 132/93 (BP Location: Left Arm)   Pulse (!) 101   Temp 98.4 F (36.9 C) (Oral)   Resp 20   Ht 6\' 5"  (1.956 m)   Wt 61 kg (134 lb 8 oz)   SpO2 99%   BMI 15.95 kg/m    Physical Exam    Constitution:  Alert, cooperative, no distress,  Psychiatric: Normal and stable mood and affect, cognition intact,   HEENT: Normocephalic, PERRL, otherwise with in Normal limits  Cardio vascular:  S1/S2, RRR, No murmure, No Rubs or Gallops  Chest/pulmonary: Increased breath sounds on the left, negative any crackles, negative any wheezing  respirations unlabored, left chest wall chest tube in place Chest symmetric Abdomen: Soft, non-tender, non-distended, bowel sounds,no masses, no organomegaly Muscular skeletal: Limited exam - in bed, able to move all 4 extremities, Normal strength,  Neuro: CNII-XII intact. , normal motor and sensation, reflexes intact  Extremities: No pitting edema lower extremities, +2 pulses  Skin: Dry, warm to touch, negative for any Rashes, No open wounds  left chest wall chest tube in place     Data Reviewed: I have personally reviewed following labs and imaging studies  CBC: Recent Labs  Lab 07/24/17 2022 07/24/17 2337 07/26/17 0614 07/27/17 0614  WBC 4.6  --  4.3 5.3  HGB 12.8* 14.3 14.1 12.9*  HCT 36.5* 42.0 40.3 37.0*  MCV 104.0*  --  103.9* 104.2*  PLT 140*  --  93* 104*   Basic Metabolic Panel: Recent Labs  Lab 07/24/17 2218 07/24/17 2337 07/26/17 0614 07/27/17 0614  NA 134* 135 136 136  K 4.9 4.2 4.4 4.1  CL 102 108 101 100*  CO2 24  --  27 27  GLUCOSE 101* 95 106* 121*  BUN 12 10 8 10   CREATININE 1.75* 1.90* 1.21 1.12  CALCIUM 8.2*  --  8.6* 8.7*   GFR: Estimated Creatinine Clearance: 59 mL/min (by C-G formula based on SCr of 1.12 mg/dL). Liver Function Tests: Recent Labs  Lab 07/24/17 2218  AST 63*  ALT 14*  ALKPHOS 124  BILITOT 1.7*  PROT 7.6  ALBUMIN 2.7*  Anemia Panel: Recent Labs    07/25/17 0834  VITAMINB12 687  FOLATE 13.2  FERRITIN 512*  TIBC 179*  IRON 157  RETICCTPCT 1.4   Sepsis Labs: Recent Labs  Lab 07/24/17 2041 07/24/17 2336  LATICACIDVEN 2.79* 1.77    Recent Results (from the past 240  hour(s))  Culture, blood (routine x 2)     Status: None (Preliminary result)   Collection Time: 07/24/17  8:35 PM  Result Value Ref Range Status   Specimen Description   Final    BLOOD LEFT ANTECUBITAL Performed at [facility], [address] [place], [place] [address]    Special Requests   Final    IN PEDIATRIC BOTTLE Blood Culture adequate volume Performed at [facility], [address], [place], [place] [address]    Culture   Final    NO GROWTH 2 DAYS Performed at [facility], [address], [place], [place] [address]    Report Status PENDING  Incomplete  Culture, blood (routine x 2)  Status: None (Preliminary result)   Collection Time: 07/24/17 10:18 PM  Result Value Ref Range Status   Specimen Description   Final    BLOOD RIGHT ANTECUBITAL Performed at [facility], [address] [place], [place] [address]    Special Requests   Final    IN PEDIATRIC BOTTLE Blood Culture adequate volume Performed at [facility], [address], [place], [place] [address]    Culture   Final    NO GROWTH 2 DAYS Performed at [facility], [address], [place], [place] [address]    Report Status PENDING  Incomplete      Radiology Studies: Dg Chest Port 1 View  Result Date: 07/26/2017 CLINICAL DATA:  Chest tube follow up, EXAM: PORTABLE CHEST 1 VIEW COMPARISON:  07/26/2017 FINDINGS: Patient has a small bore LEFT-sided chest tube, coiled over the LEFT mid lung zone, unchanged in appearance. There is minimal LEFT LOWER lobe atelectasis. Small LEFT apical pneumothorax is stable. RIGHT lung is clear. Heart size is normal. IMPRESSION: Small LEFT apical pneumothorax is stable in appearance. Electronically Signed   By: Norva Pavlov M.D.   On: 07/26/2017 17:41   Dg Chest Port 1 View  Result Date: 07/26/2017 CLINICAL DATA:  Pneumothorax EXAM: PORTABLE CHEST 1 VIEW COMPARISON:  07/25/2017 FINDINGS:  Left chest tube remains in place. Small left apical pneumothorax, decreased when compared to prior study, less than 5%. Bibasilar atelectasis. Heart is borderline in size. IMPRESSION: Decreasing size of the left pneumothorax, now noted at the left apex only, less than 5%. Left base atelectasis. Electronically Signed   By: Charlett Nose M.D.   On: 07/26/2017 09:29    Scheduled Meds: . amLODipine  5 mg Oral Daily  . feeding supplement (ENSURE ENLIVE)  237 mL Oral TID BM  . folic acid  1 mg Oral Daily  . levETIRAcetam  500 mg Oral BID  . magnesium oxide  400 mg Oral Daily  . multivitamin with minerals  1 tablet Oral Daily  . thiamine  100 mg Oral Daily   Or  . thiamine  100 mg Intravenous Daily   Continuous Infusions: . levofloxacin (LEVAQUIN) IV Stopped (07/27/17 0631)    Time spent: >25 minutes  Kendell Bane, MD [facility],  Pager [phone]  If 7PM-7AM, please contact night-coverage [email or web address]   Password [facility]  07/27/2017, 10:29 AM

## 2017-07-27 NOTE — Progress Notes (Signed)
Name: Dennis Zhang MRN: [id number] DOB: 1955-11-15    ADMISSION DATE:  07/24/2017 CONSULTATION DATE:  07/25/17  REFERRING MD :  Dr. Flossie Dibble / [facility]   CHIEF COMPLAINT:  Pneumothorax   HISTORY OF PRESENT ILLNESS:  62 y/o M, smoker (5 cigs per day for 32 years) who presented to [facility] ER on 3/5 with reports of dizziness and falls.    The patient reports he has been feeling bad for approximately 5 days.  He states he has been fatigued, weak, dizzy and cough with clear sputum production. He activated EMS where he was found to be hypotensive with SBP of 60.  The patient stated his blood pressure was high early in the am on 3/6 and he took an extra dose of lisinopril prior to coming to the ER.  He states the cough has been so bad that it hurt.    The patient was found to have a spontaneous left pneumothorax.  Chest tube was placed in the ER.  Per notes, "70 ml of yellow chylous fluid was drained" upon placement. Despite chest tube placement, he had residual pneumothorax on follow up film.  It is unclear if chest tube was on suction on the follow up film.    PCCM consulted for evaluation of pneumothorax.    He reports he does drink alcohol occasionally. He has never had shaking / withdrawal symptoms before.     SUBJECTIVE:  PT reports he feels "terrible".  Denies chest pain, SOB.  Chest tube to water seal.     VITAL SIGNS: Temp:  [98.1 F (36.7 C)-98.4 F (36.9 C)] 98.4 F (36.9 C) (03/08 0455) Pulse Rate:  [85-101] 101 (03/08 0455) Resp:  [18-20] 20 (03/08 0455) BP: (132-153)/(93-98) 132/93 (03/08 0455) SpO2:  [99 %-100 %] 99 % (03/08 0455) Weight:  [134 lb 8 oz (61 kg)] 134 lb 8 oz (61 kg) (03/08 0500)  PHYSICAL EXAMINATION: General: thin adult male in NAD, sitting up in bed  HEENT: MM pink/moist PSY: calm/appropriate  Neuro: AAOx4, speech clear, MAE CV: s1s2 rrr, no m/r/g PULM: even/non-labored, lungs bilaterally diminished XL:KGMW, non-tender, bsx4 active  Extremities: warm/dry, no  edema  Skin: no rashes or lesions   Recent Labs  Lab 07/24/17 2218 07/24/17 2337 07/26/17 0614 07/27/17 0614  NA 134* 135 136 136  K 4.9 4.2 4.4 4.1  CL 102 108 101 100*  CO2 24  --  27 27  BUN 12 10 8 10   CREATININE 1.75* 1.90* 1.21 1.12  GLUCOSE 101* 95 106* 121*    Recent Labs  Lab 07/24/17 2022 07/24/17 2337 07/26/17 0614 07/27/17 0614  HGB 12.8* 14.3 14.1 12.9*  HCT 36.5* 42.0 40.3 37.0*  WBC 4.6  --  4.3 5.3  PLT 140*  --  93* 104*    SIGNIFICANT EVENTS  3/05  Admit with spontaneous PTX  STUDIES Chest x-ray 07/24/17-large left hydropneumothorax Chest x-ray 07/25/17-placement of left chest tube.  Improvement in pneumothorax.  Chest x-ray 07/26/17- stable portion of chest tube.  Continued improvement in pneumothorax CXR 3/8 >> small residual pneumothorax   CULTURES   ANTIBIOTICS     ASSESSMENT / PLAN: Discussion: 62 y/o M, smoker, admitted with left pneumothorax.  Recent falls and URI symptoms.    Left Spontaneous Pneumothorax  Small Left Effusion - clear yellow pleural fluid drained upon insertion of chest tube.  Not chylous fluid.   Mild Right Basilar Atelectasis Tobacco Abuse   Plan: Removed left chest tube after discussion with attending  Reassess  CXR in 4 hours and in am Repeat stat CXR if new SOB, chest pain or desaturation Pulmonary hygiene  Mobilize   Canary BrimBrandi Ollis, NP-C  Pulmonary & Critical Care Pgr: [phone] or if no answer [phone] 07/27/2017, 11:33 AM

## 2017-07-28 ENCOUNTER — Inpatient Hospital Stay (HOSPITAL_COMMUNITY): Payer: Non-veteran care

## 2017-07-28 LAB — PROCALCITONIN: Procalcitonin: 0.13 ng/mL

## 2017-07-28 LAB — BASIC METABOLIC PANEL
Anion gap: 10 (ref 5–15)
BUN: 12 mg/dL (ref 6–20)
CALCIUM: 8.7 mg/dL — AB (ref 8.9–10.3)
CO2: 26 mmol/L (ref 22–32)
Chloride: 100 mmol/L — ABNORMAL LOW (ref 101–111)
Creatinine, Ser: 1.27 mg/dL — ABNORMAL HIGH (ref 0.61–1.24)
GFR calc Af Amer: >60 mL/min (ref >60)
GFR, EST NON AFRICAN AMERICAN: 59 mL/min — AB (ref >60)
GLUCOSE: 80 mg/dL (ref 65–99)
Potassium: 4 mmol/L (ref 3.5–5.1)
Sodium: 136 mmol/L (ref 135–145)

## 2017-07-28 LAB — CBC
HEMATOCRIT: 35.6 % — AB (ref 39.0–52.0)
Hemoglobin: 12.2 g/dL — ABNORMAL LOW (ref 13.0–17.0)
MCH: 35.9 pg — AB (ref 26.0–34.0)
MCHC: 34.3 g/dL (ref 30.0–36.0)
MCV: 104.7 fL — AB (ref 78.0–100.0)
PLATELETS: 94 10*3/uL — AB (ref 150–400)
RBC: 3.4 MIL/uL — ABNORMAL LOW (ref 4.22–5.81)
RDW: 13.6 % (ref 11.5–15.5)
WBC: 4.5 10*3/uL (ref 4.0–10.5)

## 2017-07-28 MED ORDER — AMLODIPINE BESYLATE 5 MG PO TABS: 5.0000 mg | ORAL_TABLET | Freq: Every day | ORAL | 0 refills | Status: DC

## 2017-07-28 MED ORDER — LEVOFLOXACIN 750 MG PO TABS: 750.0000 mg | ORAL_TABLET | Freq: Every day | ORAL | 0 refills | Status: AC

## 2017-07-28 NOTE — Progress Notes (Signed)
Patient ambulated in hallway with cane.  Tolerated well.

## 2017-07-28 NOTE — Progress Notes (Signed)
Name: Dennis Zhang MRN: [id number] DOB: 1956-02-10    ADMISSION DATE:  07/24/2017 CONSULTATION DATE:  07/25/17  REFERRING MD :  Dr. Flossie Dibble / [facility]   CHIEF COMPLAINT:  Pneumothorax   HISTORY OF PRESENT ILLNESS:  62 y/o M, smoker (5 cigs per day for 32 years) who presented to [facility] ER on 3/5 with reports of dizziness and falls.    The patient reports he has been feeling bad for approximately 5 days.  He states he has been fatigued, weak, dizzy and cough with clear sputum production. He activated EMS where he was found to be hypotensive with SBP of 60.  The patient stated his blood pressure was high early in the am on 3/6 and he took an extra dose of lisinopril prior to coming to the ER.  He states the cough has been so bad that it hurt.    The patient was found to have a spontaneous left pneumothorax.  Chest tube was placed in the ER.  Per notes, "70 ml of yellow chylous fluid was drained" upon placement. Despite chest tube placement, he had residual pneumothorax on follow up film.  It is unclear if chest tube was on suction on the follow up film.    PCCM consulted for evaluation of pneumothorax.    He reports he does drink alcohol occasionally. He has never had shaking / withdrawal symptoms before.     SUBJECTIVE:  Chest tube removed yesterday.  Chest x-ray shows stable minimal apical pneumothorax Says dyspnea is improved.  VITAL SIGNS: Temp:  [98.2 F (36.8 C)-98.5 F (36.9 C)] 98.4 F (36.9 C) (03/09 0425) Pulse Rate:  [90-117] 117 (03/09 0425) Resp:  [12-15] 15 (03/09 0425) BP: (116-128)/(76-83) 127/83 (03/09 0425) SpO2:  [97 %-100 %] 99 % (03/09 0425) Weight:  [131 lb (59.4 kg)] 131 lb (59.4 kg) (03/09 0425)  PHYSICAL EXAMINATION: Gen:      No acute distress HEENT:  EOMI, sclera anicteric Neck:     No masses; no thyromegaly Lungs:    Clear to auscultation bilaterally; normal respiratory effort CV:         Regular rate and rhythm; no murmurs Abd:      + bowel sounds; soft,  non-tender; no palpable masses, no distension Ext:    No edema; adequate peripheral perfusion Skin:      Warm and dry; no rash Neuro: alert and oriented x 3 Psych: normal mood and affect  Recent Labs  Lab 07/26/17 0614 07/27/17 0614 07/28/17 0558  NA 136 136 136  K 4.4 4.1 4.0  CL 101 100* 100*  CO2 27 27 26   BUN 8 10 12   CREATININE 1.21 1.12 1.27*  GLUCOSE 106* 121* 80    Recent Labs  Lab 07/26/17 0614 07/27/17 0614 07/28/17 0558  HGB 14.1 12.9* 12.2*  HCT 40.3 37.0* 35.6*  WBC 4.3 5.3 4.5  PLT 93* 104* 94*    SIGNIFICANT EVENTS  3/05  Admit with spontaneous PTX  STUDIES Chest x-ray 07/24/17-large left hydropneumothorax Chest x-ray 07/25/17-placement of left chest tube.  Improvement in pneumothorax.  Chest x-ray 07/26/17- stable portion of chest tube.  Continued improvement in pneumothorax CXR 3/8 >> small residual pneumothorax   CULTURES   ANTIBIOTICS     ASSESSMENT / PLAN: Discussion: 62 y/o M, smoker, admitted with left pneumothorax.  Recent falls and URI symptoms.    Left Spontaneous Pneumothorax  Small Left Effusion - clear yellow pleural fluid drained upon insertion of chest tube.  Not chylous fluid.  Mild Right Basilar Atelectasis Tobacco Abuse   Plan: Repeat chest x-ray shows minimal left apical pneumothorax after removal of chest tube I expect this will resolve slowly over the next few weeks Recommend follow-up CT in 2-4 weeks as an outpatient with primary care  PCCM will sign off.  Please call with any questions.  Chilton GreathousePraveen Lovell Roe MD [facility] Pager [phone] If no answer or after 3pm call: [phone] 07/28/2017, 10:39 AM

## 2017-07-28 NOTE — Discharge Summary (Signed)
Physician Discharge Summary  Carston Riedl  ZOX:[id number]  DOB: December 07, 1955  DOA: 07/24/2017 PCP: [facility]  Admit date: 07/24/2017 Discharge date: 07/28/2017  Admitted From: Home Disposition: Home  Recommendations for Outpatient Follow-up:  1. Follow up with PCP in 1-2 weeks 2. Repeat chest CT in 2-4-week primary care physician. 3. Please obtain BMP/CBC in one week to monitor renal function and hemoglobin  Discharge Condition: Stable CODE STATUS: Full code Diet recommendation: Heart Healthy  Brief/Interim Summary: For full details see H&P/Progress note, but in brief, Ronith Berti is a 62 year old male with medical history of hypertension, seizure disorder, alcohol abuse, chronic anemia presented to the emergency department with generalized weakness, shortness of breath and hypotension.  Upon initial evaluation chest x-ray noted to have acute large left sided hydropneumothorax.  Chest tube was placed in the ED and patient was admitted for further evaluation.  Also was noted the patient had acute renal failure and he was treated with IV fluid.  Chest tube remain in for 3 days and was managed by pulmonary.  Tube was successfully removed on 3/8 and follow-up imaging shows small left apical pneumothorax which  has been stable.  Patient was also treated with antibiotics for pneumonia.  Given clinical improvement she was deemed stable for discharge and follow-up as an outpatient.  Subjective: Evette Cristal seen and examined, he reported that he is breathing much better.  Denies any chest pain, shortness of breath and palpitation.  Patient remains afebrile, tolerating diet well and ambulating with no issues.  Discharge Diagnoses/Hospital Course:  Spontaneous pneumothorax/hydropneumothorax Felt to be secondary to pneumonia Patient was treated with chest tube, which remain in place for 3 days.  Chest tube have been successfully removed and patient remained stable with no recurrence of  pneumothorax. Follow-up CT scan in 2-4 weeks recommended. Small apical pneumothorax remaining which is suspected to resolve in few weeks.  Left lower lobe pneumonia Treated with Levaquin, will complete treatment with 2 more days of antibiotic.  Acute renal failure Felt to be prerenal due to hypotension and ACE inhibitors Patient responded well to IV fluids, creatinine back to baseline Check BMP in 1 week  Essential hypertension Initially hypotensive, due to pneumothorax. Lisinopril was discontinued as patient was taking wrong dose. He was started on amlodipine 5 mg daily. BP stable during hospital stay.  History of seizures Seizure-free during hospital stay Continue Keppra  All other chronic medical condition were stable during the hospitalization.  On the day of the discharge the patient's vitals were stable, and no other acute medical condition were reported by patient. the patient was felt safe to be discharge to home  Discharge Instructions  You were cared for by a hospitalist during your hospital stay. If you have any questions about your discharge medications or the care you received while you were in the hospital after you are discharged, you can call the unit and asked to speak with the hospitalist on call if the hospitalist that took care of you is not available. Once you are discharged, your primary care physician will handle any further medical issues. Please note that NO REFILLS for any discharge medications will be authorized once you are discharged, as it is imperative that you return to your primary care physician (or establish a relationship with a primary care physician if you do not have one) for your aftercare needs so that they can reassess your need for medications and monitor your lab values.  Discharge Instructions    Call MD for:  difficulty breathing, headache or visual disturbances   Complete by:  As directed    Call MD for:  extreme fatigue   Complete by:   As directed    Call MD for:  hives   Complete by:  As directed    Call MD for:  persistant dizziness or light-headedness   Complete by:  As directed    Call MD for:  persistant nausea and vomiting   Complete by:  As directed    Call MD for:  redness, tenderness, or signs of infection (pain, swelling, redness, odor or green/yellow discharge around incision site)   Complete by:  As directed    Call MD for:  severe uncontrolled pain   Complete by:  As directed    Call MD for:  temperature >100.4   Complete by:  As directed    Diet - low sodium heart healthy   Complete by:  As directed    Increase activity slowly   Complete by:  As directed      Allergies as of 07/28/2017      Reactions   Ace Inhibitors    Kidney injury 05/2014-do not Rx   Aspirin Nausea Only, Other (See Comments)   Reaction to Bayer aspirin - causes acid reflux and nausea   Tylenol [acetaminophen] Nausea Only   States can take Tylenol if has other pain med w/it - like Hydrocodone      Medication List    STOP taking these medications   lisinopril 10 MG tablet Commonly known as:  PRINIVIL,ZESTRIL   naproxen 375 MG tablet Commonly known as:  NAPROSYN   oxyCODONE-acetaminophen 10-325 MG tablet Commonly known as:  PERCOCET   traMADol 50 MG tablet Commonly known as:  ULTRAM     TAKE these medications   acetaminophen 500 MG tablet Commonly known as:  TYLENOL Take 500 mg by mouth 3 (three) times daily as needed (for pain.).   amLODipine 5 MG tablet Commonly known as:  NORVASC Take 1 tablet (5 mg total) by mouth daily. Start taking on:  07/29/2017   CLEAR EYES OP Place 1 drop into both eyes daily.   feeding supplement (ENSURE COMPLETE) Liqd Take 237 mLs by mouth 3 (three) times daily between meals.   folic acid 1 MG tablet Commonly known as:  FOLVITE Take 1 tablet (1 mg total) by mouth daily.   gabapentin 300 MG capsule Commonly known as:  NEURONTIN Take 1 capsule (300 mg total) by mouth 3 (three)  times daily. What changed:    when to take this  reasons to take this   levETIRAcetam 500 MG tablet Commonly known as:  KEPPRA Take 1 tablet (500 mg total) by mouth 2 (two) times daily. What changed:  Another medication with the same name was removed. Continue taking this medication, and follow the directions you see here.   levofloxacin 750 MG tablet Commonly known as:  LEVAQUIN Take 1 tablet (750 mg total) by mouth daily before breakfast for 2 days. Start taking on:  07/29/2017   Magnesium Oxide 420 MG Tabs Take 420 mg by mouth daily.   pantoprazole 40 MG tablet Commonly known as:  PROTONIX Take 1 tablet (40 mg total) by mouth daily. What changed:    when to take this  reasons to take this   tamsulosin 0.4 MG Caps capsule Commonly known as:  FLOMAX Take 1 capsule (0.4 mg total) by mouth daily after breakfast.   thiamine 100 MG tablet Take 1 tablet (100 mg  total) by mouth daily.       Allergies  Allergen Reactions  . Ace Inhibitors     Kidney injury 05/2014-do not Rx  . Aspirin Nausea Only and Other (See Comments)    Reaction to Bayer aspirin - causes acid reflux and nausea  . Tylenol [Acetaminophen] Nausea Only    States can take Tylenol if has other pain med w/it - like Hydrocodone     Consultations:  Pulmonary and cardiothoracic surgery   Procedures/Studies: Dg Chest 2 View  Result Date: 07/27/2017 CLINICAL DATA:  Follow-up left pneumothorax EXAM: CHEST - 2 VIEW COMPARISON:  07/26/2017 FINDINGS: Left chest tube remains in place. Small left apical and left basilar pneumothorax, similar to prior study. Left base atelectasis. Right lung clear. Heart is normal size. IMPRESSION: Left chest tube remains in place with small residual left apical and left basilar pneumothorax, similar to prior study. Electronically Signed   By: Charlett Nose M.D.   On: 07/27/2017 10:55   Dg Chest 2 View  Result Date: 07/24/2017 CLINICAL DATA:  Hypotension EXAM: CHEST - 2 VIEW  COMPARISON:  06/03/2017 chest radiograph. FINDINGS: Stable cardiomediastinal silhouette with normal heart size. Moderate to large left pneumothorax. No right pneumothorax. No mediastinal shift. Small left pleural effusion. No right pleural effusion. No pulmonary edema. Mild patchy left lung base opacity. Clear right lung. IMPRESSION: Moderate to large left hydropneumothorax with small left pleural effusion component. No mediastinal shift. Mild patchy left lung base opacity, probably atelectasis. Critical Value/emergent results were called by telephone at the time of interpretation on 07/24/2017 at 8:57 pm to Dr. Kristine Royal , who verbally acknowledged these results. Electronically Signed   By: Delbert Phenix M.D.   On: 07/24/2017 20:58   Dg Chest Port 1 View  Result Date: 07/28/2017 CLINICAL DATA:  Acute respiratory failure, hypoxia EXAM: PORTABLE CHEST 1 VIEW COMPARISON:  07/27/2017 FINDINGS: Bibasilar atelectasis, stable. Heart is normal size. Small residual left apical pneumothorax, stable. No visible effusions or acute bony abnormality. IMPRESSION: Small residual left apical pneumothorax, stable. Bibasilar atelectasis. Electronically Signed   By: Charlett Nose M.D.   On: 07/28/2017 07:12   Dg Chest Port 1 View  Result Date: 07/27/2017 CLINICAL DATA:  Chest tube removal. EXAM: PORTABLE CHEST 1 VIEW COMPARISON:  07/27/2017 FINDINGS: A LEFT thoracostomy catheter has been removed. A small LEFT apical pneumothorax is noted with a probable small amount of pleural fluid now noted at the apex. Mild LEFT base at LOWER atelectasis is present. The cardiomediastinal silhouette is unchanged. IMPRESSION: LEFT thoracostomy tube removal with unchanged small LEFT apical pneumothorax. The LEFT apical pleural space now may contain a small amount of fluid. Electronically Signed   By: Harmon Pier M.D.   On: 07/27/2017 17:07   Dg Chest Port 1 View  Result Date: 07/26/2017 CLINICAL DATA:  Chest tube follow up, EXAM: PORTABLE  CHEST 1 VIEW COMPARISON:  07/26/2017 FINDINGS: Patient has a small bore LEFT-sided chest tube, coiled over the LEFT mid lung zone, unchanged in appearance. There is minimal LEFT LOWER lobe atelectasis. Small LEFT apical pneumothorax is stable. RIGHT lung is clear. Heart size is normal. IMPRESSION: Small LEFT apical pneumothorax is stable in appearance. Electronically Signed   By: Norva Pavlov M.D.   On: 07/26/2017 17:41   Dg Chest Port 1 View  Result Date: 07/26/2017 CLINICAL DATA:  Pneumothorax EXAM: PORTABLE CHEST 1 VIEW COMPARISON:  07/25/2017 FINDINGS: Left chest tube remains in place. Small left apical pneumothorax, decreased when compared to prior study,  less than 5%. Bibasilar atelectasis. Heart is borderline in size. IMPRESSION: Decreasing size of the left pneumothorax, now noted at the left apex only, less than 5%. Left base atelectasis. Electronically Signed   By: Charlett NoseKevin  Dover M.D.   On: 07/26/2017 09:29   Dg Chest Port 1 View  Result Date: 07/25/2017 CLINICAL DATA:  Pneumothorax. EXAM: PORTABLE CHEST 1 VIEW COMPARISON:  Radiograph of July 24, 2017. FINDINGS: Stable cardiomediastinal silhouette. Stable position of left-sided pigtail chest tube is noted. Pneumothorax noted on prior exam is significantly smaller, with mild apical and basilar components remaining. Mild right basilar subsegmental atelectasis is noted. Small left pleural effusion may be present. Bony thorax is unremarkable. IMPRESSION: Significantly improved left pneumothorax is noted. Stable position of left-sided chest tube. Minimal left pleural effusion is noted. Mild right basilar subsegmental atelectasis is noted. Electronically Signed   By: Lupita RaiderJames  Green Jr, M.D.   On: 07/25/2017 09:44   Dg Chest Port 1 View  Result Date: 07/24/2017 CLINICAL DATA:  Chest tube EXAM: PORTABLE CHEST 1 VIEW COMPARISON:  07/24/2017, 06/03/2017 FINDINGS: Insertion of left-sided chest tube with pigtail visible over the mid to lower chest. Moderate to  large left hydropneumothorax with increased size of the pneumothorax. No midline shift. Airspace disease at the left lung base. Stable cardiomediastinal silhouette. IMPRESSION: Insertion of left-sided chest tube with pigtail projecting over the left mid chest. Apparent interval increase in size of left pneumothorax. Airspace disease at the left base which may reflect atelectasis or a pneumonia. Electronically Signed   By: Jasmine PangKim  Fujinaga M.D.   On: 07/24/2017 23:50    Discharge Exam: Vitals:   07/28/17 0002 07/28/17 0425  BP: 120/76 127/83  Pulse: (!) 101 (!) 117  Resp: 12 15  Temp: 98.4 F (36.9 C) 98.4 F (36.9 C)  SpO2: 98% 99%   Vitals:   07/27/17 1411 07/27/17 2155 07/28/17 0002 07/28/17 0425  BP: 116/81 128/77 120/76 127/83  Pulse: 90 96 (!) 101 (!) 117  Resp: 14 14 12 15   Temp: 98.5 F (36.9 C) 98.2 F (36.8 C) 98.4 F (36.9 C) 98.4 F (36.9 C)  TempSrc: Oral Oral Oral Oral  SpO2: 100% 97% 98% 99%  Weight:    59.4 kg (131 lb)  Height:        General: Pt is alert, awake, not in acute distress Cardiovascular: RRR, S1/S2 +, no rubs, no gallops Respiratory: Good air entry, no rales or crackles Abdominal: Soft, NT, ND, bowel sounds + Extremities: no edema, no cyanosis   The results of significant diagnostics from this hospitalization (including imaging, microbiology, ancillary and laboratory) are listed below for reference.     Microbiology: Recent Results (from the past 240 hour(s))  Culture, blood (routine x 2)     Status: None (Preliminary result)   Collection Time: 07/24/17  8:35 PM  Result Value Ref Range Status   Specimen Description   Final    BLOOD LEFT ANTECUBITAL Performed at [facility], [address] [place], [place] [address]    Special Requests   Final    IN PEDIATRIC BOTTLE Blood Culture adequate volume Performed at [facility], [address] [place], [place] [address]    Culture   Final    NO GROWTH 4  DAYS Performed at [facility], [address] [place], [place] [address]    Report Status PENDING  Incomplete  Culture, blood (routine x 2)     Status: None (Preliminary result)  Collection Time: 07/24/17 10:18 PM  Result Value Ref Range Status   Specimen Description   Final    BLOOD RIGHT ANTECUBITAL Performed at [facility], [address], [place], [place] [address]    Special Requests   Final    IN PEDIATRIC BOTTLE Blood Culture adequate volume Performed at [facility], [address], [place], [place] [address]    Culture   Final    NO GROWTH 4 DAYS Performed at [facility], [address] [place], [place] [address]    Report Status PENDING  Incomplete     Labs: BNP (last 3 results) No results for input(s): BNP in the last 8760 hours. Basic Metabolic Panel: Recent Labs  Lab 07/24/17 2218 07/24/17 2337 07/26/17 0614 07/27/17 0614 07/28/17 0558  NA 134* 135 136 136 136  K 4.9 4.2 4.4 4.1 4.0  CL 102 108 101 100* 100*  CO2 24  --  27 27 26   GLUCOSE 101* 95 106* 121* 80  BUN 12 10 8 10 12   CREATININE 1.75* 1.90* 1.21 1.12 1.27*  CALCIUM 8.2*  --  8.6* 8.7* 8.7*   Liver Function Tests: Recent Labs  Lab 07/24/17 2218  AST 63*  ALT 14*  ALKPHOS 124  BILITOT 1.7*  PROT 7.6  ALBUMIN 2.7*   No results for input(s): LIPASE, AMYLASE in the last 168 hours. No results for input(s): AMMONIA in the last 168 hours. CBC: Recent Labs  Lab 07/24/17 2022 07/24/17 2337 07/26/17 0614 07/27/17 0614 07/28/17 0558  WBC 4.6  --  4.3 5.3 4.5  HGB 12.8* 14.3 14.1 12.9* 12.2*  HCT 36.5* 42.0 40.3 37.0* 35.6*  MCV 104.0*  --  103.9* 104.2* 104.7*  PLT 140*  --  93* 104* 94*   Cardiac Enzymes: No results for input(s): CKTOTAL, CKMB, CKMBINDEX, TROPONINI in the last 168 hours. BNP: Invalid input(s): POCBNP CBG: No results for input(s): GLUCAP in the last 168 hours. D-Dimer No results for input(s): DDIMER in the  last 72 hours. Hgb A1c No results for input(s): HGBA1C in the last 72 hours. Lipid Profile No results for input(s): CHOL, HDL, LDLCALC, TRIG, CHOLHDL, LDLDIRECT in the last 72 hours. Thyroid function studies No results for input(s): TSH, T4TOTAL, T3FREE, THYROIDAB in the last 72 hours.  Invalid input(s): FREET3 Anemia work up No results for input(s): VITAMINB12, FOLATE, FERRITIN, TIBC, IRON, RETICCTPCT in the last 72 hours. Urinalysis    Component Value Date/Time   COLORURINE YELLOW 07/24/2017 2218   APPEARANCEUR CLEAR 07/24/2017 2218   LABSPEC 1.014 07/24/2017 2218   PHURINE 5.0 07/24/2017 2218   GLUCOSEU NEGATIVE 07/24/2017 2218   HGBUR NEGATIVE 07/24/2017 2218   BILIRUBINUR NEGATIVE 07/24/2017 2218   KETONESUR NEGATIVE 07/24/2017 2218   PROTEINUR NEGATIVE 07/24/2017 2218   UROBILINOGEN 1.0 06/14/2014 1847   NITRITE NEGATIVE 07/24/2017 2218   LEUKOCYTESUR NEGATIVE 07/24/2017 2218   Sepsis Labs Invalid input(s): PROCALCITONIN,  WBC,  LACTICIDVEN Microbiology Recent Results (from the past 240 hour(s))  Culture, blood (routine x 2)     Status: None (Preliminary result)   Collection Time: 07/24/17  8:35 PM  Result Value Ref Range Status   Specimen Description   Final    BLOOD LEFT ANTECUBITAL Performed at [facility], [address] [place], [place] [address]    Special Requests   Final    IN PEDIATRIC BOTTLE Blood Culture adequate volume Performed at [facility], [address] [place], [place]  [address]    Culture   Final    NO GROWTH 4 DAYS Performed at [facility], [address], [place], [place] [address]    Report Status PENDING  Incomplete  Culture, blood (routine x 2)     Status: None (Preliminary result)   Collection Time: 07/24/17 10:18 PM  Result Value Ref Range Status   Specimen Description   Final    BLOOD RIGHT ANTECUBITAL Performed at [facility], [address] [place], [place]  [address]    Special Requests   Final    IN PEDIATRIC BOTTLE Blood Culture adequate volume Performed at [facility], [address] [place], [place] [address]    Culture   Final    NO GROWTH 4 DAYS Performed at [facility], [address] [place], [place] [address]    Report Status PENDING  Incomplete     Time coordinating discharge: 32 minutes  SIGNED:  Latrelle Dodrill, MD  [facility] 07/28/2017, 1:58 PM  Pager please text page via  [email or web address]  Note - This record has been created using [employer]. Chart creation errors have been sought, but may not always have been located. Such creation errors do not reflect on the standard of medical care.

## 2017-07-28 NOTE — Progress Notes (Signed)
Patient left hospital without bus pass or discharge instructions.  MD notified via text page.

## 2017-07-29 LAB — CULTURE, BLOOD (ROUTINE X 2)
CULTURE: NO GROWTH
Culture: NO GROWTH
SPECIAL REQUESTS: ADEQUATE
SPECIAL REQUESTS: ADEQUATE

## 2017-09-11 ENCOUNTER — Emergency Department (HOSPITAL_COMMUNITY)
Admission: EM | Admit: 2017-09-11 | Discharge: 2017-09-12 | Disposition: A | Discharge status: Home / Self Care | Payer: Non-veteran care | Attending: Emergency Medicine | Admitting: Emergency Medicine

## 2017-09-11 ENCOUNTER — Emergency Department (HOSPITAL_COMMUNITY): Payer: Non-veteran care

## 2017-09-11 ENCOUNTER — Encounter (HOSPITAL_COMMUNITY): Payer: Self-pay | Admitting: Radiology

## 2017-09-11 DIAGNOSIS — G8929 Other chronic pain: Secondary | ICD-10-CM | POA: Diagnosis not present

## 2017-09-11 DIAGNOSIS — R296 Repeated falls: Secondary | ICD-10-CM | POA: Insufficient documentation

## 2017-09-11 DIAGNOSIS — M6281 Muscle weakness (generalized): Secondary | ICD-10-CM | POA: Diagnosis not present

## 2017-09-11 DIAGNOSIS — E119 Type 2 diabetes mellitus without complications: Secondary | ICD-10-CM | POA: Diagnosis not present

## 2017-09-11 DIAGNOSIS — R42 Dizziness and giddiness: Secondary | ICD-10-CM

## 2017-09-11 DIAGNOSIS — I10 Essential (primary) hypertension: Secondary | ICD-10-CM | POA: Insufficient documentation

## 2017-09-11 DIAGNOSIS — M545 Low back pain: Secondary | ICD-10-CM | POA: Diagnosis not present

## 2017-09-11 DIAGNOSIS — Z79899 Other long term (current) drug therapy: Secondary | ICD-10-CM | POA: Diagnosis not present

## 2017-09-11 DIAGNOSIS — F17210 Nicotine dependence, cigarettes, uncomplicated: Secondary | ICD-10-CM | POA: Diagnosis not present

## 2017-09-11 LAB — COMPREHENSIVE METABOLIC PANEL
ALT: 24 U/L (ref 17–63)
AST: 56 U/L — AB (ref 15–41)
Albumin: 3.1 g/dL — ABNORMAL LOW (ref 3.5–5.0)
Alkaline Phosphatase: 95 U/L (ref 38–126)
Anion gap: 11 (ref 5–15)
BUN: 21 mg/dL — AB (ref 6–20)
CO2: 24 mmol/L (ref 22–32)
Calcium: 8.8 mg/dL — ABNORMAL LOW (ref 8.9–10.3)
Chloride: 95 mmol/L — ABNORMAL LOW (ref 101–111)
Creatinine, Ser: 1.51 mg/dL — ABNORMAL HIGH (ref 0.61–1.24)
GFR calc Af Amer: 55 mL/min — ABNORMAL LOW (ref >60)
GFR, EST NON AFRICAN AMERICAN: 48 mL/min — AB (ref >60)
Glucose, Bld: 100 mg/dL — ABNORMAL HIGH (ref 65–99)
POTASSIUM: 4.8 mmol/L (ref 3.5–5.1)
Sodium: 130 mmol/L — ABNORMAL LOW (ref 135–145)
Total Bilirubin: 2 mg/dL — ABNORMAL HIGH (ref 0.3–1.2)
Total Protein: 7.3 g/dL (ref 6.5–8.1)

## 2017-09-11 LAB — RAPID URINE DRUG SCREEN, HOSP PERFORMED
AMPHETAMINES: NOT DETECTED
Barbiturates: NOT DETECTED
Benzodiazepines: NOT DETECTED
COCAINE: NOT DETECTED
OPIATES: POSITIVE — AB
TETRAHYDROCANNABINOL: NOT DETECTED

## 2017-09-11 LAB — URINALYSIS, ROUTINE W REFLEX MICROSCOPIC
Bilirubin Urine: NEGATIVE
Glucose, UA: NEGATIVE mg/dL
HGB URINE DIPSTICK: NEGATIVE
Ketones, ur: NEGATIVE mg/dL
Leukocytes, UA: NEGATIVE
NITRITE: NEGATIVE
Protein, ur: NEGATIVE mg/dL
SPECIFIC GRAVITY, URINE: 1.02 (ref 1.005–1.030)
pH: 5 (ref 5.0–8.0)

## 2017-09-11 LAB — CBC WITH DIFFERENTIAL/PLATELET
BASOS ABS: 0 10*3/uL (ref 0.0–0.1)
BASOS PCT: 0 %
Eosinophils Absolute: 0 10*3/uL (ref 0.0–0.7)
Eosinophils Relative: 0 %
HCT: 37.1 % — ABNORMAL LOW (ref 39.0–52.0)
Hemoglobin: 12.9 g/dL — ABNORMAL LOW (ref 13.0–17.0)
LYMPHS PCT: 27 %
Lymphs Abs: 1.2 10*3/uL (ref 0.7–4.0)
MCH: 35.9 pg — ABNORMAL HIGH (ref 26.0–34.0)
MCHC: 34.8 g/dL (ref 30.0–36.0)
MCV: 103.3 fL — AB (ref 78.0–100.0)
MONO ABS: 0.7 10*3/uL (ref 0.1–1.0)
Monocytes Relative: 16 %
Neutro Abs: 2.4 10*3/uL (ref 1.7–7.7)
Neutrophils Relative %: 57 %
Platelets: 91 10*3/uL — ABNORMAL LOW (ref 150–400)
RBC: 3.59 MIL/uL — ABNORMAL LOW (ref 4.22–5.81)
RDW: 15.1 % (ref 11.5–15.5)
WBC: 4.3 10*3/uL (ref 4.0–10.5)

## 2017-09-11 LAB — I-STAT TROPONIN, ED: Troponin i, poc: 0.01 ng/mL (ref 0.00–0.08)

## 2017-09-11 LAB — I-STAT CG4 LACTIC ACID, ED: Lactic Acid, Venous: 1.96 mmol/L — ABNORMAL HIGH (ref 0.5–1.9)

## 2017-09-11 LAB — MAGNESIUM: Magnesium: 1.9 mg/dL (ref 1.7–2.4)

## 2017-09-11 LAB — LIPASE, BLOOD: Lipase: 23 U/L (ref 11–51)

## 2017-09-11 MED ORDER — SODIUM CHLORIDE 0.9 % IV BOLUS
1000.0000 mL | Freq: Once | INTRAVENOUS | Status: AC
  Administered 2017-09-12: 1000 mL via INTRAVENOUS

## 2017-09-11 MED ORDER — OXYCODONE-ACETAMINOPHEN 5-325 MG PO TABS
2.0000 | ORAL_TABLET | Freq: Once | ORAL | Status: AC
  Administered 2017-09-11: 2 via ORAL
  Filled 2017-09-11: qty 2

## 2017-09-11 MED ORDER — ACETAMINOPHEN 325 MG PO TABS
650.0000 mg | ORAL_TABLET | Freq: Once | ORAL | Status: DC
Start: 2017-09-11 — End: 2017-09-12
  Filled 2017-09-11: qty 2

## 2017-09-11 MED ORDER — LORAZEPAM 2 MG/ML IJ SOLN
1.0000 mg | Freq: Once | INTRAMUSCULAR | Status: AC | PRN
Start: 2017-09-11 — End: 2017-09-12
  Administered 2017-09-12: 1 mg via INTRAVENOUS
  Filled 2017-09-11 (×2): qty 1

## 2017-09-11 MED ORDER — SODIUM CHLORIDE 0.9 % IV BOLUS
1000.0000 mL | Freq: Once | INTRAVENOUS | Status: AC
  Administered 2017-09-11: 1000 mL via INTRAVENOUS

## 2017-09-11 NOTE — ED Triage Notes (Signed)
Pt coming from home. CC of weakness and dizziness. Stated when he tried to get up,  He felt dizzy and had to lay back down. Pt states has not been feeling well x 2days. Has not been taking medications.

## 2017-09-12 ENCOUNTER — Emergency Department (HOSPITAL_COMMUNITY): Payer: Non-veteran care

## 2017-09-12 LAB — I-STAT CG4 LACTIC ACID, ED: LACTIC ACID, VENOUS: 1.07 mmol/L (ref 0.5–1.9)

## 2017-09-12 MED ORDER — THIAMINE HCL 100 MG/ML IJ SOLN
Freq: Once | INTRAVENOUS | Status: AC
  Administered 2017-09-12: 01:00:00 via INTRAVENOUS
  Filled 2017-09-12: qty 1000

## 2017-09-12 NOTE — ED Notes (Signed)
Pt. Alert and resting.

## 2017-09-12 NOTE — ED Notes (Signed)
Pt getting dressed.

## 2017-09-12 NOTE — Discharge Instructions (Signed)
Continue your daily medications and be sure to drink plenty of water throughout the day.  Avoid drinking alcohol as this may worsen your balance.  Have your kidney function rechecked by your primary care doctor within the week.  You may return to the emergency department for new or concerning symptoms.

## 2017-09-12 NOTE — ED Provider Notes (Addendum)
5:59 AM Patient care assumed from [facility], PA-C at change of shift.  Pending MRI to evaluate complaints of weakness and dizziness.  MRI reviewed which is negative for any acute or emergent process.  The patient has been hydrated in the emergency department with 2 L of IV fluids.  His laboratory workup is consistent with 1 month ago.  There is only slight worsening of his creatinine since discharge.  Patient with increase in his heart rate with orthostatic testing.  I have personally ambulated the patient and he has no complaints of lightheadedness or dizziness upon standing.  He is ambulatory independently with steady gait with use of a cane which he has required at baseline ambulation for the past 10 years.  He denies any complaints at this time.  Patient given Sprite and graham crackers with peanut butter at his request.  He has remained hemodynamically stable and I believe that he is appropriate for continued outpatient follow-up at this time.  Return precautions discussed and provided. Patient discharged in stable condition with no unaddressed concerns.   Results for orders placed or performed during the hospital encounter of 09/11/17  CBC with Differential  Result Value Ref Range   WBC 4.3 4.0 - 10.5 K/uL   RBC 3.59 (L) 4.22 - 5.81 MIL/uL   Hemoglobin 12.9 (L) 13.0 - 17.0 g/dL   HCT 16.1 (L) 09.6 - 04.5 %   MCV 103.3 (H) 78.0 - 100.0 fL   MCH 35.9 (H) 26.0 - 34.0 pg   MCHC 34.8 30.0 - 36.0 g/dL   RDW 40.9 81.1 - 91.4 %   Platelets 91 (L) 150 - 400 K/uL   Neutrophils Relative % 57 %   Neutro Abs 2.4 1.7 - 7.7 K/uL   Lymphocytes Relative 27 %   Lymphs Abs 1.2 0.7 - 4.0 K/uL   Monocytes Relative 16 %   Monocytes Absolute 0.7 0.1 - 1.0 K/uL   Eosinophils Relative 0 %   Eosinophils Absolute 0.0 0.0 - 0.7 K/uL   Basophils Relative 0 %   Basophils Absolute 0.0 0.0 - 0.1 K/uL  Comprehensive metabolic panel  Result Value Ref Range   Sodium 130 (L) 135 - 145 mmol/L   Potassium 4.8 3.5  - 5.1 mmol/L   Chloride 95 (L) 101 - 111 mmol/L   CO2 24 22 - 32 mmol/L   Glucose, Bld 100 (H) 65 - 99 mg/dL   BUN 21 (H) 6 - 20 mg/dL   Creatinine, Ser 7.82 (H) 0.61 - 1.24 mg/dL   Calcium 8.8 (L) 8.9 - 10.3 mg/dL   Total Protein 7.3 6.5 - 8.1 g/dL   Albumin 3.1 (L) 3.5 - 5.0 g/dL   AST 56 (H) 15 - 41 U/L   ALT 24 17 - 63 U/L   Alkaline Phosphatase 95 38 - 126 U/L   Total Bilirubin 2.0 (H) 0.3 - 1.2 mg/dL   GFR calc non Af Amer 48 (L) >60 mL/min   GFR calc Af Amer 55 (L) >60 mL/min   Anion gap 11 5 - 15  Urinalysis, Routine w reflex microscopic  Result Value Ref Range   Color, Urine YELLOW YELLOW   APPearance CLEAR CLEAR   Specific Gravity, Urine 1.020 1.005 - 1.030   pH 5.0 5.0 - 8.0   Glucose, UA NEGATIVE NEGATIVE mg/dL   Hgb urine dipstick NEGATIVE NEGATIVE   Bilirubin Urine NEGATIVE NEGATIVE   Ketones, ur NEGATIVE NEGATIVE mg/dL   Protein, ur NEGATIVE NEGATIVE mg/dL   Nitrite NEGATIVE  NEGATIVE   Leukocytes, UA NEGATIVE NEGATIVE  Rapid urine drug screen (hospital performed)  Result Value Ref Range   Opiates POSITIVE (A) NONE DETECTED   Cocaine NONE DETECTED NONE DETECTED   Benzodiazepines NONE DETECTED NONE DETECTED   Amphetamines NONE DETECTED NONE DETECTED   Tetrahydrocannabinol NONE DETECTED NONE DETECTED   Barbiturates NONE DETECTED NONE DETECTED  Magnesium  Result Value Ref Range   Magnesium 1.9 1.7 - 2.4 mg/dL  Lipase, blood  Result Value Ref Range   Lipase 23 11 - 51 U/L  I-Stat Troponin, ED (not at [facility])  Result Value Ref Range   Troponin i, poc 0.01 0.00 - 0.08 ng/mL   Comment 3          I-Stat CG4 Lactic Acid, ED  Result Value Ref Range   Lactic Acid, Venous 1.96 (H) 0.5 - 1.9 mmol/L  I-Stat CG4 Lactic Acid, ED  Result Value Ref Range   Lactic Acid, Venous 1.07 0.5 - 1.9 mmol/L   Ct Head Wo Contrast  Result Date: 09/11/2017 CLINICAL DATA:  Generalized weakness and dizziness EXAM: CT HEAD WITHOUT CONTRAST TECHNIQUE: Contiguous axial images were  obtained from the base of the skull through the vertex without intravenous contrast. COMPARISON:  07/31/2016 FINDINGS: Brain: No acute territorial infarction, hemorrhage or intracranial mass. Moderate atrophy. Mild small vessel ischemic changes of the white matter. Stable prominent ventricles. Vascular: No hyperdense vessels.  Carotid vascular calcification Skull: Normal. Negative for fracture or focal lesion. Sinuses/Orbits: No acute finding. Other: None IMPRESSION: 1. No CT evidence for acute intracranial abnormality. 2. Atrophy and chronic small vessel ischemic changes of the white matter. Electronically Signed   By: Jasmine PangKim  Fujinaga M.D.   On: 09/11/2017 22:31   Mr Brain Wo Contrast  Result Date: 09/12/2017 CLINICAL DATA:  Acute onset dizziness and weakness since last night. Feeling unwell for 2 days. History of hypertension, hyperlipidemia, seizures. EXAM: MRI HEAD WITHOUT CONTRAST TECHNIQUE: Multiplanar, multiecho pulse sequences of the brain and surrounding structures were obtained without intravenous contrast. COMPARISON:  CT HEAD September 11, 2017 FINDINGS: Multiple sequences are moderately motion degraded. INTRACRANIAL CONTENTS: No reduced diffusion to suggest acute ischemia. No susceptibility artifact to suggest hemorrhage though motion limits sensitivity. Moderate parenchymal brain volume loss. No hydrocephalus. Patchy to confluent supratentorial and pontine white matter T2 hyperintensities. No suspicious parenchymal signal, masses, mass effect. No abnormal extra-axial fluid collections. No extra-axial masses. VASCULAR: Normal major intracranial vascular flow voids present at skull base. SKULL AND UPPER CERVICAL SPINE: No abnormal sellar expansion. No suspicious calvarial bone marrow signal. Craniocervical junction maintained. SINUSES/ORBITS: The mastoid air-cells and included paranasal sinuses are well-aerated.The included ocular globes and orbital contents are non-suspicious. OTHER: None. IMPRESSION: 1.  No acute intracranial process on this motion degraded examination. 2. Moderate parenchymal brain volume loss. 3. Moderate chronic small vessel ischemic disease. Electronically Signed   By: Awilda Metroourtnay  Bloomer M.D.   On: 09/12/2017 03:42     Antony MaduraHumes, Noma Quijas, PA-C 09/12/17 0602    Antony MaduraHumes, Aayra Hornbaker, PA-C 09/12/17 [id number]    Shon BatonHorton, Courtney F, MD 09/13/17 [id number]

## 2017-09-12 NOTE — ED Provider Notes (Signed)
[facility] EMERGENCY DEPARTMENT Provider Note   CSN: [id number] Arrival date & time: 09/11/17  1932     History   Chief Complaint Chief Complaint  Patient presents with  . Weakness  . Dizziness    HPI Dennis Zhang is a 62 y.o. male with a h/o DM, hypertension, seizure disorder, alcohol abuse, and chronic anemia.  The patient endorses sudden onset dizziness and weakness that began last night.  He reports the dizziness has been constant since onset, characterized as the room spinning around and feeling as if he might fall over.  He states that he started to fall over earlier today but was able to catch himself on a door before falling to the ground.  He denies LOC, hitting his head, nausea, or emesis.  He states that he has been so dizzy since onset but has had to lay back down.  He also reports that he has been generally feeling unwell over the last 2 days and his legs have felt weak like they were going to give out.  He denies changes to his vision, fever, chills, neck pain or stiffness, nausea, vomiting, tinnitus, otalgia, headache, chest pain, dyspnea, dysuria, abdominal pain, cough, rash, numbness, slurred speech, or facial drooping.  He states the last time he felt like this his blood pressure was really low.  He reports that he was admitted from 3/5-07/28/17 after he presented with generalized weakness.  He reports that he has been dizzy and had 4 falls prior to presenting in the emergency department during that visit.  He was found to have a spontaneous pneumothorax, left lower lobe pneumonia, acute renal failure.  [facility] after he was discharged, but his labs are not available at this time.  He also endorses chronic low back pain that radiates down his left leg.  He reports that he has not been taking his home pain medication.  No urinary or fecal incontinence or perianal numbness.  He reports that  he last had 2 beers yesterday at 18:30.  The history is provided by the patient. No language interpreter was used.  Weakness  Primary symptoms include dizziness. Pertinent negatives include no shortness of breath, no chest pain, no vomiting, no confusion and no headaches.  Dizziness  Associated symptoms: weakness   Associated symptoms: no chest pain, no diarrhea, no headaches, no nausea, no shortness of breath, no tinnitus and no vomiting     Past Medical History:  Diagnosis Date  . Arthritis   . Chronic pain   . Chronic pain   . 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 Hyperlipidemia   . Hypertension   . Seizures [facility])     Patient Active Problem List   Diagnosis Date Noted  . Spontaneous pneumothorax 07/25/2017  . Alcohol withdrawal syndrome with complication (HCC)   . Elevated lactic acid level   . Seizure (HCC)   . Leukopenia 05/23/2016  . Lactic acidosis 05/23/2016  . Syncope and collapse 05/22/2016  . Syncope 06/15/2014  . Hypotension 06/15/2014  . Fall   . Arterial hypotension   . Weakness   . Dehydration 06/14/2014  . Protein-calorie malnutrition, severe (HCC) 05/21/2014  . Acute kidney injury (HCC) 05/20/2014  . Orthostatic hypotension 05/20/2014  . Alcohol abuse 05/20/2014  . Transaminitis 05/20/2014  . Thrombocytopenia (HCC) 05/20/2014  . Acute renal failure (HCC) 03/03/2014  . Alcohol intoxication (HCC) 03/03/2014  .  Increased anion gap metabolic acidosis 03/03/2014  . Malnutrition of moderate degree (HCC) 03/03/2014  . Hypokalemia 10/06/2012  . Gram-negative bacteremia 10/06/2012  . Leukocytosis 10/03/2012  . Chronic pancreatitis (HCC) 10/03/2012  . AKI (acute kidney injury) (HCC) 10/03/2012  . Alcoholic hepatitis 10/03/2012  . Hypoglycemia secondary to sulfonylurea 10/03/2012    Past Surgical History:  Procedure Laterality Date  . ABDOMINAL SURGERY    . APPENDECTOMY    . HEMORROIDECTOMY          Home Medications     Prior to Admission medications   Medication Sig Start Date End Date Taking? Authorizing Provider  folic acid (FOLVITE) 1 MG tablet Take 1 tablet (1 mg total) by mouth daily. 05/24/16  Yes Albertine Grates, MD  gabapentin (NEURONTIN) 300 MG capsule Take 1 capsule (300 mg total) by mouth 3 (three) times daily. Patient taking differently: Take 300 mg by mouth 4 (four) times daily as needed (for nerve pain.).  07/31/12  Yes Schinlever, Santina Evans, PA-C  levETIRAcetam (KEPPRA) 500 MG tablet Take 1 tablet (500 mg total) by mouth 2 (two) times daily. Patient taking differently: Take 500 mg by mouth daily.  06/13/16  Yes Cheron Schaumann K, PA-C  lisinopril (PRINIVIL,ZESTRIL) 2.5 MG tablet Take 1.25 mg by mouth 2 (two) times daily.    Yes [provider]  Magnesium Oxide 420 MG TABS Take 420 mg by mouth daily.    Yes [provider]  meloxicam (MOBIC) 15 MG tablet Take 15 mg by mouth daily.   Yes [provider]  Naphazoline HCl (CLEAR EYES OP) Place 1 drop into both eyes daily.   Yes [provider]  pantoprazole (PROTONIX) 40 MG tablet Take 1 tablet (40 mg total) by mouth daily. Patient taking differently: Take 40 mg by mouth daily as needed (indigestion).  05/24/16  Yes Albertine Grates, MD  sildenafil (VIAGRA) 100 MG tablet Take 50 mg by mouth daily as needed for erectile dysfunction.    Yes [provider]  thiamine 100 MG tablet Take 1 tablet (100 mg total) by mouth daily. 05/24/16  Yes Albertine Grates, MD  amLODipine (NORVASC) 5 MG tablet Take 1 tablet (5 mg total) by mouth daily. Patient not taking: Reported on 09/11/2017 07/29/17   Randel Pigg, Dorma Russell, MD  feeding supplement, ENSURE COMPLETE, (ENSURE COMPLETE) LIQD Take 237 mLs by mouth 3 (three) times daily between meals. Patient not taking: Reported on 09/11/2017 06/16/14   Rhetta Mura, MD  levETIRAcetam (KEPPRA) 500 MG tablet Take 1 tablet (500 mg total) by mouth 2 (two) times daily. Patient not taking: Reported on  08/03/2016 08/02/16   Renne Musca, MD  naproxen (NAPROSYN) 375 MG tablet Take 1 tablet (375 mg total) by mouth 2 (two) times daily. Patient not taking: Reported on 07/24/2017 07/26/16   Trixie Dredge, PA-C  tamsulosin [facility]) 0.4 MG CAPS capsule Take 1 capsule (0.4 mg total) by mouth daily after breakfast. Patient not taking: Reported on 08/03/2016 05/23/16   Albertine Grates, MD  traMADol (ULTRAM) 50 MG tablet Take 1 tablet (50 mg total) by mouth every 6 (six) hours as needed for moderate pain. Patient not taking: Reported on 07/24/2017 05/23/16   Albertine Grates, MD    Family History Family History  Problem Relation Age of Onset  . Hypertension Mother   . Migraines Sister   . Heart failure Brother   . Migraines Brother     Social History Social History   Tobacco Use  . Smoking status: Current Every Day  Smoker    Packs/day: 0.50    Years: 30.00    Pack years: 15.00    Types: Cigarettes  . Smokeless tobacco: Never Used  Substance Use Topics  . Alcohol use: Yes    Comment: occ  . Drug use: No     Allergies   Ace inhibitors; Aspirin; and Tylenol [acetaminophen]   Review of Systems Review of Systems  Constitutional: Negative for appetite change, chills, diaphoresis and fever.  HENT: Negative for congestion, ear pain and tinnitus.   Eyes: Negative for visual disturbance.  Respiratory: Negative for shortness of breath.   Cardiovascular: Negative for chest pain.  Gastrointestinal: Negative for abdominal pain, diarrhea, nausea and vomiting.  Genitourinary: Negative for dysuria, frequency and urgency.  Musculoskeletal: Positive for back pain (chronic). Negative for gait problem, neck pain and neck stiffness.  Skin: Negative for rash and wound.  Allergic/Immunologic: Negative for immunocompromised state.  Neurological: Positive for dizziness and weakness. Negative for seizures, syncope, speech difficulty, light-headedness, numbness and headaches.  Psychiatric/Behavioral: Negative for confusion.      Physical Exam Updated Vital Signs BP 134/79   Pulse 86   Temp 98.5 F (36.9 C) (Oral)   Resp 17   Ht 6\' 2"  (1.88 m)   Wt 70.8 kg (156 lb)   SpO2 98%   BMI 20.03 kg/m   Physical Exam  Constitutional: He is oriented to person, place, and time. He appears well-developed. No distress.  HENT:  Head: Normocephalic and atraumatic.  Eyes: Pupils are equal, round, and reactive to light. Conjunctivae and EOM are normal. Right eye exhibits no discharge. Left eye exhibits no discharge. No scleral icterus.  Neck: Normal range of motion. Neck supple. No tracheal deviation present.  Cardiovascular: Normal rate, regular rhythm, normal heart sounds and intact distal pulses. Exam reveals no gallop and no friction rub.  No murmur heard. Pulmonary/Chest: Effort normal and breath sounds normal. No stridor. No respiratory distress. He has no wheezes. He has no rales. He exhibits no tenderness.  Abdominal: Soft. He exhibits no distension and no mass. There is no tenderness. There is no rebound and no guarding. No hernia.  Musculoskeletal: He exhibits tenderness.  Tender to palpation in the low back.  Neurological: He is alert and oriented to person, place, and time. Coordination abnormal.  Cranial nerves II through XII are grossly intact.  5 out of 5 strength against resistance of the bilateral upper and lower extremities.  Sensation is intact throughout.  Normal finger-to-nose bilaterally.  Ataxia with Romberg.  Antalgic, slightly ataxic gait.  Speaks in fluent complete sentences without slurring.   Skin: Skin is warm and dry. Capillary refill takes less than 2 seconds. No rash noted. He is not diaphoretic. No erythema. No pallor.  Psychiatric: His behavior is normal.  Nursing note and vitals reviewed.    ED Treatments / Results  Labs (all labs ordered are listed, but only abnormal results are displayed) Labs Reviewed  CBC WITH DIFFERENTIAL/PLATELET - Abnormal; Notable for the following  components:      Result Value   RBC 3.59 (*)    Hemoglobin 12.9 (*)    HCT 37.1 (*)    MCV 103.3 (*)    MCH 35.9 (*)    Platelets 91 (*)    All other components within normal limits  COMPREHENSIVE METABOLIC PANEL - Abnormal; Notable for the following components:   Sodium 130 (*)    Chloride 95 (*)    Glucose, Bld 100 (*)    BUN 21 (*)  Creatinine, Ser 1.51 (*)    Calcium 8.8 (*)    Albumin 3.1 (*)    AST 56 (*)    Total Bilirubin 2.0 (*)    GFR calc non Af Amer 48 (*)    GFR calc Af Amer 55 (*)    All other components within normal limits  RAPID URINE DRUG SCREEN, HOSP PERFORMED - Abnormal; Notable for the following components:   Opiates POSITIVE (*)    All other components within normal limits  I-STAT CG4 LACTIC ACID, ED - Abnormal; Notable for the following components:   Lactic Acid, Venous 1.96 (*)    All other components within normal limits  URINALYSIS, ROUTINE W REFLEX MICROSCOPIC  MAGNESIUM  LIPASE, BLOOD  I-STAT TROPONIN, ED  I-STAT CG4 LACTIC ACID, ED    EKG EKG Interpretation  Date/Time:  Tuesday September 11 2017 21:02:50 EDT Ventricular Rate:  93 PR Interval:  158 QRS Duration: 76 QT Interval:  396 QTC Calculation: 492 R Axis:   -85 Text Interpretation:  Normal sinus rhythm Right atrial enlargement Left axis deviation Low voltage QRS Abnormal ECG No STEMI.  Similar to prior.  Confirmed by Alona Bene [id number]) on 09/11/2017 9:07:47 PM   Radiology Ct Head Wo Contrast  Result Date: 09/11/2017 CLINICAL DATA:  Generalized weakness and dizziness EXAM: CT HEAD WITHOUT CONTRAST TECHNIQUE: Contiguous axial images were obtained from the base of the skull through the vertex without intravenous contrast. COMPARISON:  07/31/2016 FINDINGS: Brain: No acute territorial infarction, hemorrhage or intracranial mass. Moderate atrophy. Mild small vessel ischemic changes of the white matter. Stable prominent ventricles. Vascular: No hyperdense vessels.  Carotid vascular  calcification Skull: Normal. Negative for fracture or focal lesion. Sinuses/Orbits: No acute finding. Other: None IMPRESSION: 1. No CT evidence for acute intracranial abnormality. 2. Atrophy and chronic small vessel ischemic changes of the white matter. Electronically Signed   By: Jasmine Pang M.D.   On: 09/11/2017 22:31    Procedures Procedures (including critical care time)  Medications Ordered in ED Medications  acetaminophen (TYLENOL) tablet 650 mg (650 mg Oral Refused 09/11/17 2318)  LORazepam (ATIVAN) injection 1 mg (has no administration in time range)  sodium chloride 0.9 % bolus 1,000 mL (0 mLs Intravenous Stopped 09/12/17 0002)  sodium chloride 0.9 % bolus 1,000 mL (0 mLs Intravenous Stopped 09/12/17 0134)  oxyCODONE-acetaminophen (PERCOCET/ROXICET) 5-325 MG per tablet 2 tablet (2 tablets Oral Given 09/11/17 2323)  sodium chloride 0.9 % 1,000 mL with thiamine 100 mg, folic acid 1 mg, multivitamins adult 10 mL infusion ( Intravenous New Bag/Given 09/12/17 0115)     Initial Impression / Assessment and Plan / ED Course  I have reviewed the triage vital signs and the nursing notes.  Pertinent labs & imaging results that were available during my care of the patient were reviewed by me and considered in my medical decision making (see chart for details).     62 year old male with a h/o DM, hypertension, seizure disorder, alcohol abuse, and chronic anemia presenting with dizziness, generalized weakness, and recurrent falls.  Arrival blood pressure measured at 86/40 by EMS.  BP has remained 120s-130s/70-80s since he has been roomed.  Mild orthostatic hypotension. Lactate is on the upper end of normal at 1.96.  IV fluid bolus given and repeat downtrending to1.07.  He is also mildly hyponatremic at 130 and creatinine 1.51, up 0.3 since most recent discharge.  Hemoglobin is stable at 12.9.  Urinalysis is unremarkable.  UDS is positive for opiates, but the patient is  on chronic home narcotics. CT  head with atrophy and chronic small vessel ischemic changes.  Otherwise unremarkable.  On physical exam, the patient has ataxia, but no other focal neurologic findings.  The patient was discussed with Dr. Jacqulyn BathLong, attending physician.  CIWA score ordered.  He was given his home dose of pain medicine.  Will repeat IV fluid bolus and orthostatic vital signs.  Given recurrent dizziness and ataxic findings on exam, MRI brain ordered. [facility] end of my shift, pending repeat orthostatic vital signs and MRI results. Patient presentation, ED course, and plan of care discussed with review of all pertinent labs and imaging. Please see his/her note for further details regarding further ED course and disposition.  Final Clinical Impressions(s) / ED Diagnoses   Final diagnoses:  None    ED Discharge Orders    None       Barkley BoardsMcDonald, Dulcey Riederer A, PA-C 09/12/17 0216    Long, Arlyss RepressJoshua G, MD 09/12/17 [phone]

## 2017-09-12 NOTE — ED Notes (Signed)
Patient transported to MRI 

## 2018-09-05 IMAGING — CR DG CHEST 2V
3 series · 3 of 3 positions shown · non-contrast
Comparison: 04/30/2015 chest radiograph

CLINICAL DATA: 60 y/o  M; cough and body aches.

EXAM:
CHEST  2 VIEW

[w chest lat]
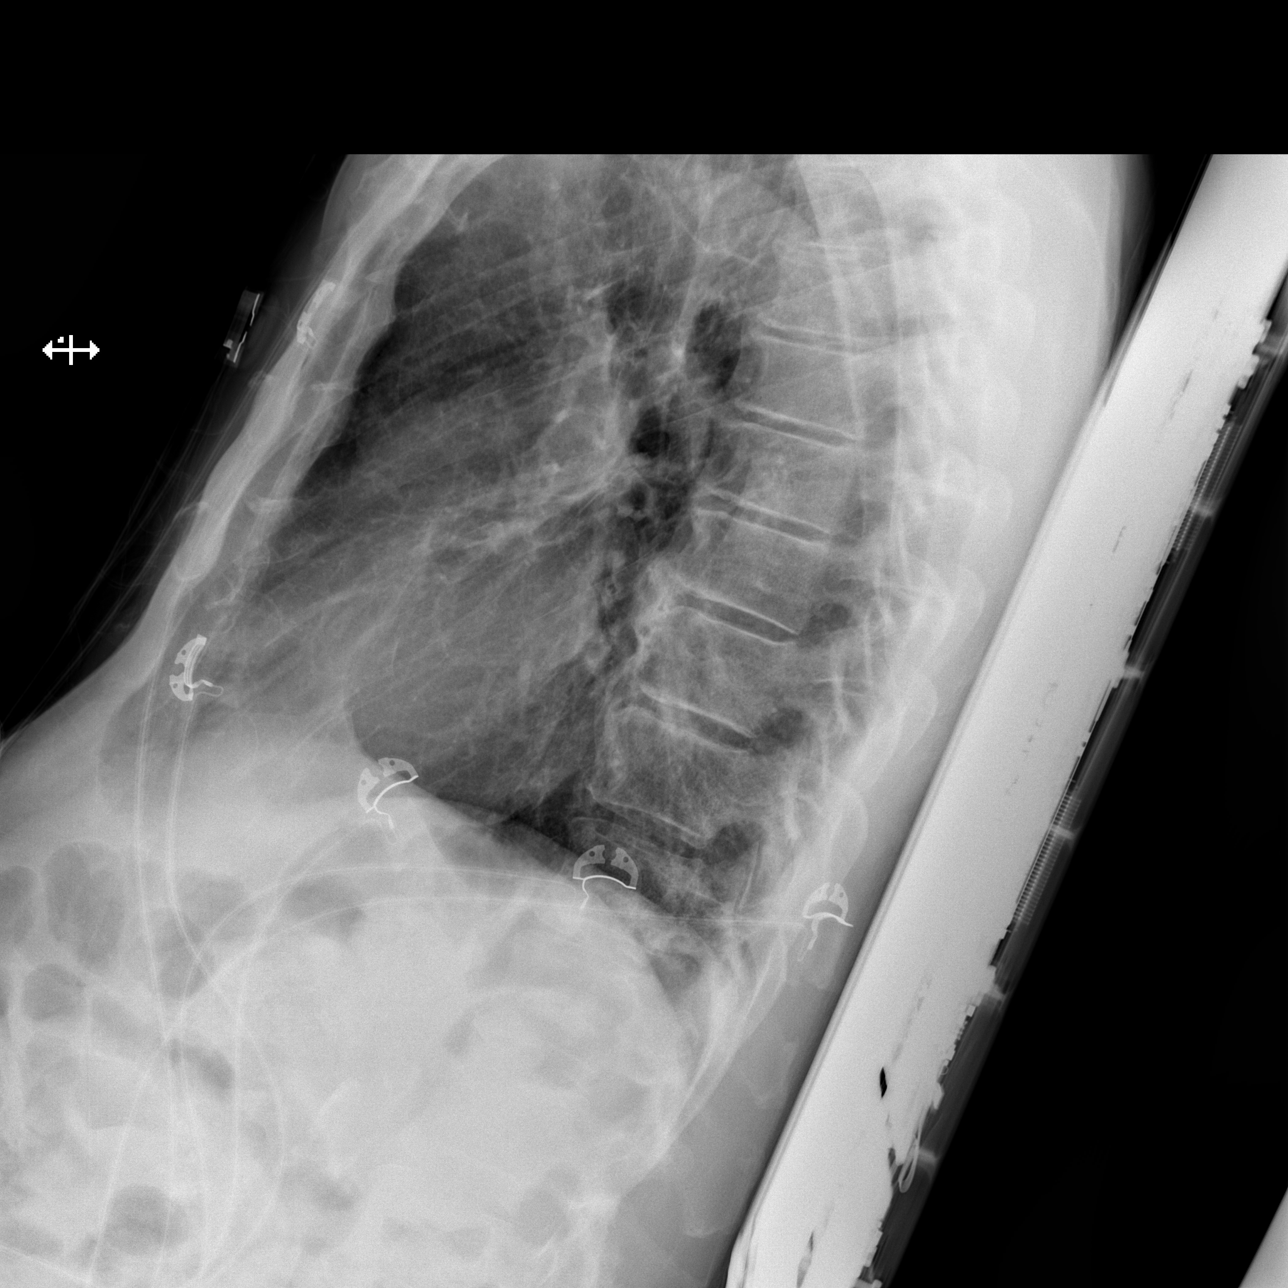

[x chest ap (1 of 2)]
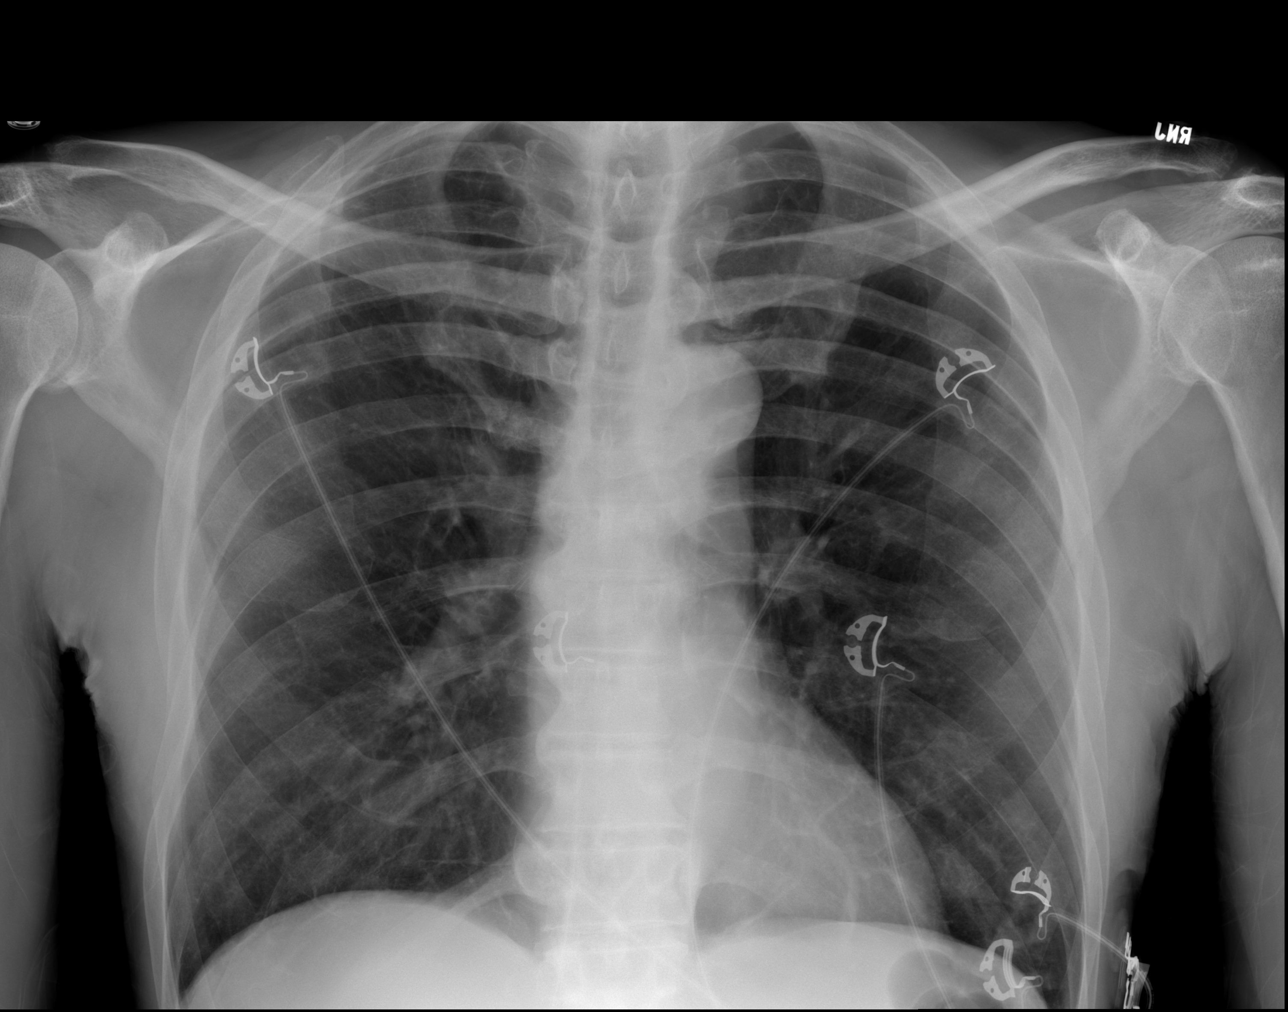

[x chest ap (2 of 2)]
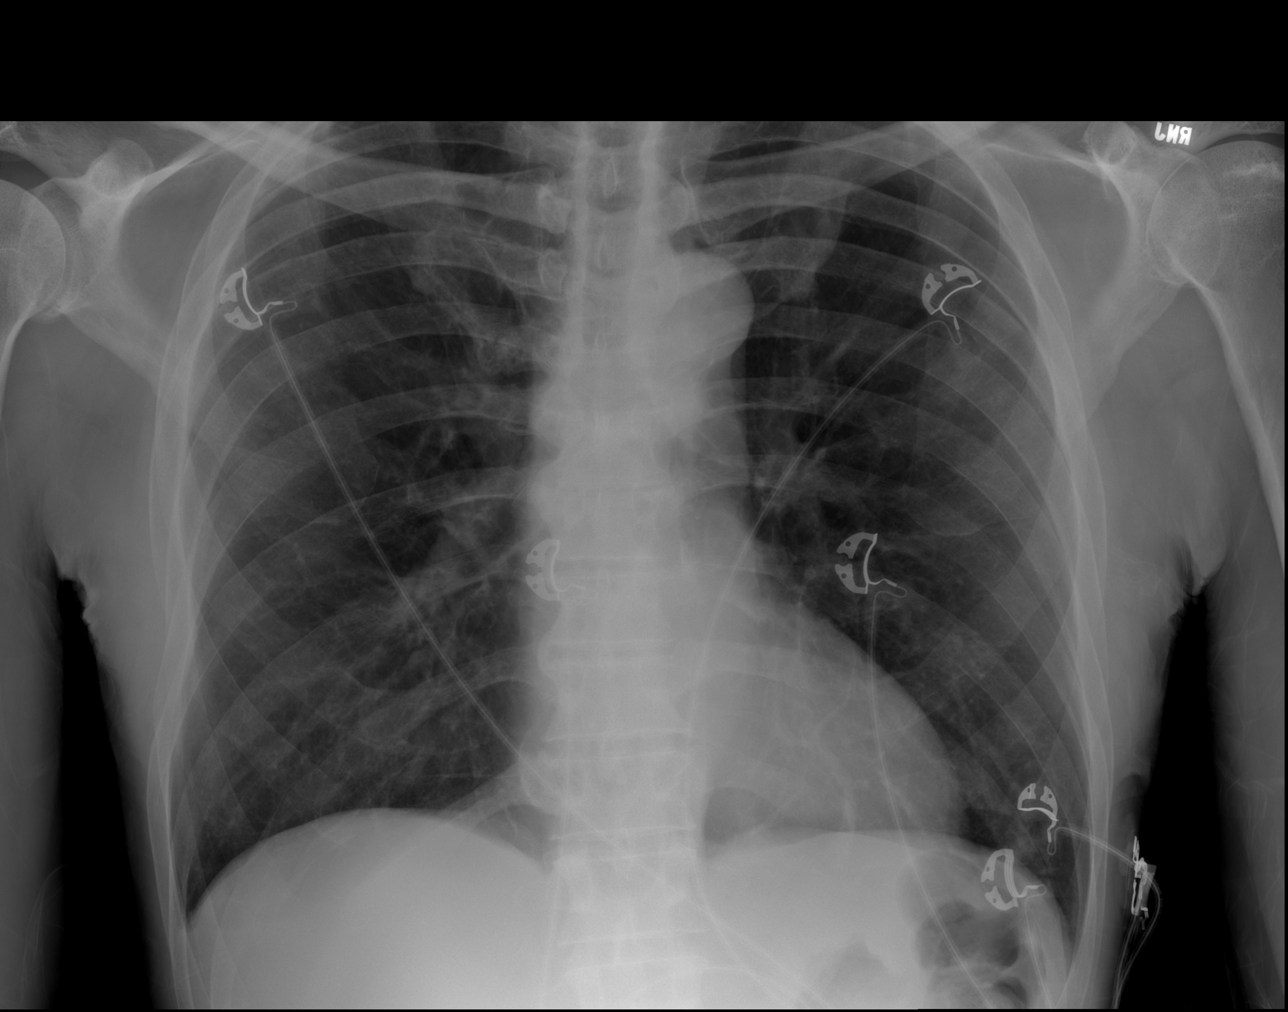

[3 of 3 positions shown; findings below may reference images not displayed]

FINDINGS: Stable heart size and mediastinal contours are within normal limits.
Both lungs are clear. No pleural effusion. Mild degenerative changes
of the thoracic spine.
IMPRESSION: No active cardiopulmonary disease.

By: Pesral Peti M.D.

## 2020-08-05 ENCOUNTER — Encounter: Payer: Self-pay | Admitting: Physical Medicine and Rehabilitation

## 2020-09-28 ENCOUNTER — Encounter
Payer: Medicare Other | Attending: Physical Medicine and Rehabilitation | Admitting: Physical Medicine and Rehabilitation

## 2020-09-28 ENCOUNTER — Encounter: Payer: Self-pay | Admitting: Physical Medicine and Rehabilitation

## 2020-09-28 ENCOUNTER — Other Ambulatory Visit: Payer: Self-pay

## 2020-09-28 VITALS — BP 145/96 | HR 90 | Temp 97.8°F | Ht 74.0 in | Wt 146.8 lb

## 2020-09-28 DIAGNOSIS — Z79891 Long term (current) use of opiate analgesic: Secondary | ICD-10-CM | POA: Insufficient documentation

## 2020-09-28 DIAGNOSIS — Z7901 Long term (current) use of anticoagulants: Secondary | ICD-10-CM | POA: Insufficient documentation

## 2020-09-28 DIAGNOSIS — Z79899 Other long term (current) drug therapy: Secondary | ICD-10-CM | POA: Diagnosis present

## 2020-09-28 DIAGNOSIS — G894 Chronic pain syndrome: Secondary | ICD-10-CM | POA: Diagnosis not present

## 2020-09-28 DIAGNOSIS — Z5181 Encounter for therapeutic drug level monitoring: Secondary | ICD-10-CM | POA: Insufficient documentation

## 2020-09-28 NOTE — Progress Notes (Signed)
Subjective:    Patient ID: Dennis Zhang, male    DOB: 1955/10/30, 65 y.o.   MRN: [id number]  HPI  Dennis Zhang is a 65 year old man who presents with severe osteoarthritis in his back, hip, and knee, worse in the right side. He fell off a ladder 44 feet when painting in [facility]. Average pain is 10/10. He does physical therapy which helps. He takes Tylenol and Gabapentin (which makes him nausea). He has had good benefits with Percocet in the past. He took 2-3 Percocet 10mg  per day. Its been 5 months since he stopped getting them from the . He is wearing a TLSO that helps him to stand straight. The Percocet brings the pain down to a 4/10.   He has a sense humor.   BP today is 145/96. It is usually stable at home.   Pain Inventory Average Pain 10 Pain Right Now 9 My pain is constant, sharp, burning, dull, stabbing, tingling and aching  In the last 24 hours, has pain interfered with the following? General activity 7 Relation with others 0 Enjoyment of life 10 What TIME of day is your pain at its worst? varies Sleep (in general) Poor  Pain is worse with: walking, bending, sitting and some activites Pain improves with: rest, heat/ice, pacing activities, medication and TENS Relief from Meds: FAIR  use a cane use a walker how many minutes can you walk? 45 MINS ability to climb steps?  yes do you drive?  no Do you have any goals in this area?  yes  disabled: date disabled 27 YRS I need assistance with the following:  meal prep and household duties Do you have any goals in this area?  yes  weakness trouble walking dizziness confusion depression  Any changes since last visit?  no New Patient  Any changes since last visit?  no New Patient    Family History  Problem Relation Age of Onset  . Hypertension Mother   . Migraines Sister   . Heart failure Brother   . Migraines Brother    Social History   Socioeconomic History  . Marital status: Single    Spouse  name: Not on file  . Number of children: Not on file  . Years of education: Not on file  . Highest education level: Not on file  Occupational History  . Not on file  Tobacco Use  . Smoking status: Current Every Day Smoker    Packs/day: 0.50    Years: 30.00    Pack years: 15.00    Types: Cigarettes  . Smokeless tobacco: Never Used  Substance and Sexual Activity  . Alcohol use: Yes    Comment: occ  . Drug use: No  . Sexual activity: Not on file  Other Topics Concern  . Not on file  Social History Narrative  . Not on file   Social Determinants of Health   Financial Resource Strain: Not on file  Food Insecurity: Not on file  Transportation Needs: Not on file  Physical Activity: Not on file  Stress: Not on file  Social Connections: Not on file   Past Surgical History:  Procedure Laterality Date  . ABDOMINAL SURGERY    . APPENDECTOMY    . HEMORROIDECTOMY     Past Medical History:  Diagnosis Date  . Arthritis   . Chronic pain   . Chronic pain   . 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 Hyperlipidemia   . Hypertension   .  Seizures (HCC)    There were no vitals taken for this visit.  Opioid Risk Score:   Fall Risk Score:  `1  Depression screen PHQ 2/9  No flowsheet data found. Review of Systems  Musculoskeletal: Positive for back pain and gait problem.  Psychiatric/Behavioral: Positive for confusion.       Depression       Objective:   Physical Exam  Gen: no distress, normal appearing HEENT: oral mucosa pink and moist, NCAT Cardio: Reg rate Chest: normal effort, normal rate of breathing Abd: soft, non-distended Ext: no edema Psych: pleasant, normal affect Skin: intact Neuro: Alert and oriented x3.  Musculoskeletal: Lumbar spine with extremely limited flexion and extension by pain. Antalgic gait with cane TLSO in place.     Assessment & Plan:  1) Chronic Pain Syndrome secondary to arthritis of spine, right hip and knee s/p  fall from 44 feet years ago and history of military service -Discussed current symptoms of pain and history of pain.  -Discussed benefits of exercise in reducing pain. -can discontinue Gabapentin since causing GI distress -Reviewed failed and successful medications and injections -USD obtained and pain contract signed today. If acceptable, will prescribe Percocet 10mg -325mg  BID PRN. Can increase back to prior frequency of TID PRN if he requires at f/u visit -Discussed following foods that may reduce pain: 1) Ginger (especially studied for arthritis)- reduce leukotriene production to decrease inflammation 2) Blueberries- high in phytonutrients that decrease inflammation 3) Salmon- marine omega-3s reduce joint swelling and pain 4) Pumpkin seeds- reduce inflammation 5) dark chocolate- reduces inflammation 6) turmeric- reduces inflammation 7) tart cherries - reduce pain and stiffness 8) extra virgin olive oil - its compound olecanthal helps to block prostaglandins  9) chili peppers- can be eaten or applied topically via capsaicin 10) mint- helpful for headache, muscle aches, joint pain, and itching 11) garlic- reduces inflammation  Link to further information on diet for chronic pain:  2) HTN -145/96 today, may be due to white coat syndrome/physical exertion from traveling to appointment -check daily and bring log to f/u appointment

## 2020-09-28 NOTE — Patient Instructions (Signed)
1) Ginger (especially studied for arthritis)- reduce leukotriene production to decrease inflammation 2) Blueberries- high in phytonutrients that decrease inflammation 3) Salmon- marine omega-3s reduce joint swelling and pain 4) Pumpkin seeds- reduce inflammation 5) dark chocolate- reduces inflammation 6) turmeric- reduces inflammation 7) tart cherries - reduce pain and stiffness 8) extra virgin olive oil - its compound olecanthal helps to block prostaglandins  9) chili peppers- can be eaten or applied topically via capsaicin 10) mint- helpful for headache, muscle aches, joint pain, and itching 11) garlic- reduces inflammation  Link to further information on diet for chronic pain: [email or web address]  

## 2020-10-04 ENCOUNTER — Telehealth: Payer: Self-pay

## 2020-10-04 NOTE — Telephone Encounter (Signed)
Dennis Zhang called for his pain medication. Patient has been advised when his UDS has been resulted out he will be informed. If the Rx can be filled.

## 2020-10-07 ENCOUNTER — Telehealth: Payer: Self-pay | Admitting: *Deleted

## 2020-10-07 ENCOUNTER — Encounter: Payer: Self-pay | Admitting: *Deleted

## 2020-10-07 LAB — TOXASSURE SELECT,+ANTIDEPR,UR

## 2020-10-07 NOTE — Telephone Encounter (Signed)
I have notified Dennis Zhang that we are sending him a letter that states due to his uds report he will be non narcotic treamtent only. His next appt is 10/26/20 with Jacalyn Lefevre NP but we need to switch him to Dr Carlis Abbott.

## 2020-10-07 NOTE — Telephone Encounter (Signed)
Patient left a message asking about getting a script for pain meds. He says he was seen last week and was told drug screen results would come back sometime this week.

## 2020-10-07 NOTE — Telephone Encounter (Signed)
Urine drug screen was positive for alcohol as well as THC. Letter mailed to Mr Rossbach informing him of non narcotic treatment only.

## 2020-10-26 ENCOUNTER — Ambulatory Visit: Payer: Medicare Other | Admitting: Registered Nurse

## 2020-11-23 ENCOUNTER — Ambulatory Visit: Payer: Medicare Other | Admitting: Registered Nurse

## 2020-11-26 ENCOUNTER — Encounter: Payer: Medicare Other | Admitting: Physical Medicine and Rehabilitation

## 2020-12-22 ENCOUNTER — Ambulatory Visit: Payer: Medicare Other | Admitting: Registered Nurse

## 2021-01-20 NOTE — Progress Notes (Signed)
Subjective:    Patient ID: Dennis Zhang, male    DOB: [birth year], 65 y.o.   MRN: [id number]  HPI  Dennis Zhang is a 65 year old man who presents for follow-up of severe osteoarthritis in his back, hip, and knee, worse in the right side. He fell off a ladder 44 feet when painting in [facility]. Average pain is 10/10. He does physical therapy which helps. He takes Tylenol and Gabapentin (which makes him nauseous). He has had good benefits with Percocet in the past. He took 2-3 Percocet 10mg  per day. Its been 5 months since he stopped getting them from the . He is wearing a TLSO that helps him to stand straight. The Percocet brings the pain down to a 4/10.   He has a sense humor.   BP today is 145/96. It is usually stable at home.   Pain Inventory Average Pain 10 Pain Right Now 9 My pain is constant, sharp, burning, dull, stabbing, tingling and aching  In the last 24 hours, has pain interfered with the following? General activity 7 Relation with others 0 Enjoyment of life 10 What TIME of day is your pain at its worst? varies Sleep (in general) Poor  Pain is worse with: walking, bending, sitting and some activites Pain improves with: rest, heat/ice, pacing activities, medication and TENS Relief from Meds:  FAIR  use a cane use a walker how many minutes can you walk? 45 MINS ability to climb steps?  yes do you drive?  no Do you have any goals in this area?  yes  disabled: date disabled 47 YRS I need assistance with the following:  meal prep and household duties Do you have any goals in this area?  yes  weakness trouble walking dizziness confusion depression  Any changes since last visit?  no New Patient  Any changes since last visit?  no New Patient    Family History  Problem Relation Age of Onset   Hypertension Mother    Migraines Sister    Heart failure Brother    Migraines Brother    Social History   Socioeconomic History   Marital status: Single     Spouse name: Not on file   Number of children: Not on file   Years of education: Not on file   Highest education level: Not on file  Occupational History   Not on file  Tobacco Use   Smoking status: Every Day    Packs/day: 0.50    Years: 30.00    Pack years: 15.00    Types: Cigarettes   Smokeless tobacco: Never  Vaping Use   Vaping Use: Never used  Substance and Sexual Activity   Alcohol use: Yes    Comment: occ   Drug use: No   Sexual activity: Not on file  Other Topics Concern   Not on file  Social History Narrative   Not on file   Social Determinants of Health   Financial Resource Strain: Not on file  Food Insecurity: Not on file  Transportation Needs: Not on file  Physical Activity: Not on file  Stress: Not on file  Social Connections: Not on file   Past Surgical History:  Procedure Laterality Date   ABDOMINAL SURGERY     APPENDECTOMY     HEMORROIDECTOMY     Past Medical History:  Diagnosis Date   Arthritis    Chronic pain    Chronic pain    Diabetes mellitus without complication (HCC)  borderline   Hyperlipidemia    Hypertension    Seizures (HCC)    There were no vitals taken for this visit.  Opioid Risk Score:   Fall Risk Score:  `1  Depression screen PHQ 2/9  Depression screen PHQ 2/9 09/28/2020  Decreased Interest 3  Down, Depressed, Hopeless 3  PHQ - 2 Score 6  Altered sleeping 1  Tired, decreased energy 1  Change in appetite 0  Feeling bad or failure about yourself  0  Trouble concentrating 1  Moving slowly or fidgety/restless 0  Suicidal thoughts 0  PHQ-9 Score 9   Review of Systems  Musculoskeletal:  Positive for back pain and gait problem.  Psychiatric/Behavioral:  Positive for confusion.        Depression      Objective:   Physical Exam  Gen: no distress, normal appearing HEENT: oral mucosa pink and moist, NCAT Cardio: Reg rate Chest: normal effort, normal rate of breathing Abd: soft, non-distended Ext: no  edema Psych: pleasant, normal affect Skin: intact Neuro: Alert and oriented x3.  Musculoskeletal: Lumbar spine with extremely limited flexion and extension by pain. Antalgic gait with cane TLSO in place.     Assessment & Plan:  1) Chronic Pain Syndrome secondary to arthritis of spine, right hip and knee s/p fall from 44 feet years ago and history of military service -Discussed current symptoms of pain and history of pain.  -Discussed benefits of exercise in reducing pain. -can discontinue Gabapentin since causing GI distress -Reviewed failed and successful medications and injections -USD obtained and pain contract signed previously and contained THC so we were unable to prescribe controlled substances for him. Can repeat this visit and if acceptable, will prescribe Percocet 10mg -325mg  BID PRN. Can increase back to prior frequency of TID PRN if he requires at f/u visit Chronic Pain Syndrome secondary to___ -Discussed current symptoms of pain and history of pain.  -Discussed benefits of exercise in reducing pain. -Discussed following foods that may reduce pain: 1) Ginger (especially studied for arthritis)- reduce leukotriene production to decrease inflammation 2) Blueberries- high in phytonutrients that decrease inflammation 3) Salmon- marine omega-3s reduce joint swelling and pain 4) Pumpkin seeds- reduce inflammation 5) dark chocolate- reduces inflammation 6) turmeric- reduces inflammation 7) tart cherries - reduce pain and stiffness 8) extra virgin olive oil - its compound olecanthal helps to block prostaglandins  9) chili peppers- can be eaten or applied topically via capsaicin 10) mint- helpful for headache, muscle aches, joint pain, and itching 11) garlic- reduces inflammation  Link to further information on diet for chronic pain:   2) HTN -145/96 today, may be due to white coat  syndrome/physical exertion from traveling to appointment -check daily and bring log to f/u appointment

## 2021-01-21 ENCOUNTER — Encounter
Payer: Medicare Other | Attending: Physical Medicine and Rehabilitation | Admitting: Physical Medicine and Rehabilitation

## 2021-01-21 DIAGNOSIS — Z79899 Other long term (current) drug therapy: Secondary | ICD-10-CM

## 2021-01-21 DIAGNOSIS — I10 Essential (primary) hypertension: Secondary | ICD-10-CM

## 2021-01-21 DIAGNOSIS — Z5181 Encounter for therapeutic drug level monitoring: Secondary | ICD-10-CM

## 2021-01-21 DIAGNOSIS — G894 Chronic pain syndrome: Secondary | ICD-10-CM

## 2021-01-21 DIAGNOSIS — Z79891 Long term (current) use of opiate analgesic: Secondary | ICD-10-CM

## 2021-01-21 DIAGNOSIS — M1990 Unspecified osteoarthritis, unspecified site: Secondary | ICD-10-CM

## 2023-03-01 VITALS — BP 131/88 | HR 71 | Temp 98.0°F | Resp 18 | Ht 74.0 in | Wt 146.8 lb

## 2023-03-01 DIAGNOSIS — Z006 Encounter for examination for normal comparison and control in clinical research program: Secondary | ICD-10-CM

## 2023-03-01 NOTE — Research (Addendum)
AA HEART  Informed Consent   Subject Name: Dennis Zhang  Subject met inclusion and exclusion criteria.  The informed consent form, study requirements and expectations were reviewed with the subject and questions and concerns were addressed prior to the signing of the consent form.  The subject verbalized understanding of the trial requirements.  The subject agreed to participate in the AA HEART  trial and signed the informed consent at 11:50 AM on 03-01-2023.  The informed consent was obtained prior to performance of any protocol-specific procedures for the subject.  A copy of the signed informed consent was given to the subject and a copy was placed in the subject's medical record.   [place] Jinna Weinman, Research Coordinator    Are there any labs that are clinically significant?  Yes []  OR No[]   ACCESSION NO. [id number]                                             Page 1 of 1                                                        INVESTIGATOR: ([id number])                          PROTOCOL   [id number]                     Thomasene Ripple, M.D.                              INVESTIGATOR NO.: [id number]                     c/o [facility]                         SUBJECT NUMBER: [id number]                     [facility]                 SUBJECT INITIALS NOT COLLECTED:                     [address]                          VISIT: D0                     [place], [place] 0                   SPONSOR REPORT TO:                 COLLECTION TIME:12:00 DATE:05-Mar-2023                     Cruzita Lederer                 DATE RECEIVED IN LABORATORY: 06-Mar-2023  c/o Sponsor(or Addtl) [facility].Study DATE REPORTED BY LABORATORY: 06-Mar-2023                     [facility]                          SEX: M  AGE: 8W                     [id number]                   [place], [place] [phone]                                                                                         Ref. Ranges               Clinical    Comments                                                                          Significance                                                                            Yes*  No                    HEMATOLOGY&DIFFERENTIAL PANEL                      HGB            13.9         12.5-17.0 g/dL                      HCT            41           37-51 %                      RBC            4.0          4.0-5.8 x106/uL                      MCH            35      H    26-34 pg                      MCHC  34           31-38 g/dL                      RDW            13.6         12.0-15.0 %                      RBC Morph      No Review Required                        MCV            104     H    80-100 fL                      WBC            4.21         3.80-10.70 x103/uL                      Neutrophil     2.06         1.96-7.23 x103/uL                      Lymphocyte     1.80         0.80-3.00 x103/uL                      Monocytes      0.30         0.12-0.92 x103/uL                      Eosinophil     0.04         0.00-0.57 x103/uL                      Basophils      0.01         0.00-0.20 x103/uL                      Neutrophil     48.8         40.5-75.0 %                      Lymphocyte     42.8         15.4-48.5%                      Monocytes      7.2          2.6-10.1 %                      Eosinophil     0.9          0.0-6.8 %                      Basophils      0.3          0.0-2.0 %                      Platelets      166          130-394 x103/uL ANC  ANC            2.06         1.96-7.23 x103/uL                    HBA1C                      Fast HbA1c     5.6          <6.5%  RETICULOCYTES                      Retic %        1.1          0.6-2.5 %                      Retic Abs      0.043        0.030-0.130 x106/uL  Participant ID: [id number]  LP(a) Result: 53.3 nmol/L  Date Resulted:  May 07, 2023  Date of Coaching: 07/09/2023   [x]  (Please check once complete) Discussed Lp(a) result with participant:    What is Lipoprotein(a) Lp(a)?  What is a "normal" Lp(a) level, and when should I be concerned?  How is Lp(a) related to "bad cholesterol?"  Does a high Lp(a) level increase my risk of heart disease?  How are Lp(a) levels inherited?  Do Lp(a) levels vary by race or ethnicity?  Diagnosing High Lp(a)  What are the signs of high Lp(a)?  How to get an Lp(a) test?  What Lp(a) results are considered high? How do I get a diagnosis of elevated lipoprotein(a)?  How can I lower my Lp(a)?    During the return of results coaching session, all the topics listed above were reviewed with the participant as per the documents: Guidance for Clinician-Patient Lp(a) Risk Assessment Discussion African American Heart Study and Micro-Learning Session: High Lipoprotein(a) 101.  Participant verbalized understanding of test results and what to do next: to ask their healthcare practitioner to test Lp(a) level, advise first-degree relatives to be screened, and work to reduce all cardiovascular risk factors within their control, especially LDL cholesterol.

## 2023-03-01 NOTE — Progress Notes (Signed)
This 67 year old gentleman is here for AA Heart.  He has no definite cardiac symptoms. He does have R calf pain which seems exertional. He has chronic pain syndrome and has been seen by [facility].  He has a prior PTX.  He is added to the control group.  Protocol explained to him by [place] Chalmers, and he is agreeable.   Subject met inclusion and exclusion criteria.  The informed consent form, study requirements and expectations were reviewed with the subject and questions and concerns were addressed prior to the signing of the consent form.  The subject verbalized understanding of the trial requirements.  The subject agreed to participate in the AA HEART  trial and signed the informed consent at 11:50 AM on 03-01-2023.  The informed consent was obtained prior to performance of any protocol-specific procedures for the subject.  A copy of the signed informed consent was given to the subject and a copy was placed in the subject's medical record.    [place] Chalmers, Designer, industrial/product   Thin, older appearing AA gentleman in NAD BP 131/88 (BP Location: Left Arm)      Pulse 71      Temp 98 F (36.7 C)      Resp 18      Ht 6\' 2"  (1.88 m)       Wt 146 lb  No JVD No carotid bruits Lungs clear Cor normal S1 and S2 without murmur Abd soft without HS megaly Femoral pulses intact without bruits DP prominent bilaterally.  Dry flaking skill in feet.    Impression   Control for AA Heart  Labs to be obtained.    Arturo Morton. Riley Kill, MD, [facility] Medical Director, [facility]

## 2023-03-14 ENCOUNTER — Other Ambulatory Visit: Payer: Self-pay

## 2023-03-14 ENCOUNTER — Emergency Department (HOSPITAL_COMMUNITY)
Admission: EM | Admit: 2023-03-14 | Discharge: 2023-03-14 | Disposition: A | Discharge status: Home / Self Care | Payer: Medicare HMO | Attending: Emergency Medicine | Admitting: Emergency Medicine

## 2023-03-14 ENCOUNTER — Encounter (HOSPITAL_COMMUNITY): Payer: Self-pay

## 2023-03-14 ENCOUNTER — Emergency Department (HOSPITAL_COMMUNITY): Payer: Medicare HMO

## 2023-03-14 DIAGNOSIS — J439 Emphysema, unspecified: Secondary | ICD-10-CM | POA: Insufficient documentation

## 2023-03-14 DIAGNOSIS — K861 Other chronic pancreatitis: Secondary | ICD-10-CM | POA: Diagnosis not present

## 2023-03-14 DIAGNOSIS — Y9301 Activity, walking, marching and hiking: Secondary | ICD-10-CM | POA: Insufficient documentation

## 2023-03-14 DIAGNOSIS — I2510 Atherosclerotic heart disease of native coronary artery without angina pectoris: Secondary | ICD-10-CM | POA: Diagnosis not present

## 2023-03-14 DIAGNOSIS — M25552 Pain in left hip: Secondary | ICD-10-CM | POA: Insufficient documentation

## 2023-03-14 DIAGNOSIS — J479 Bronchiectasis, uncomplicated: Secondary | ICD-10-CM | POA: Insufficient documentation

## 2023-03-14 DIAGNOSIS — I700 Atherosclerosis of aorta: Secondary | ICD-10-CM | POA: Insufficient documentation

## 2023-03-14 DIAGNOSIS — K8020 Calculus of gallbladder without cholecystitis without obstruction: Secondary | ICD-10-CM | POA: Insufficient documentation

## 2023-03-14 DIAGNOSIS — R4781 Slurred speech: Secondary | ICD-10-CM | POA: Diagnosis not present

## 2023-03-14 DIAGNOSIS — Y92410 Unspecified street and highway as the place of occurrence of the external cause: Secondary | ICD-10-CM | POA: Diagnosis not present

## 2023-03-14 DIAGNOSIS — T07XXXA Unspecified multiple injuries, initial encounter: Secondary | ICD-10-CM

## 2023-03-14 LAB — I-STAT CHEM 8, ED
BUN: 18 mg/dL (ref 8–23)
Calcium, Ion: 0.53 mmol/L — CL (ref 1.15–1.40)
Chloride: 99 mmol/L (ref 98–111)
Creatinine, Ser: 1.7 mg/dL — ABNORMAL HIGH (ref 0.61–1.24)
Glucose, Bld: 97 mg/dL (ref 70–99)
HCT: 51 % (ref 39.0–52.0)
Hemoglobin: 17.3 g/dL — ABNORMAL HIGH (ref 13.0–17.0)
Potassium: 7.5 mmol/L (ref 3.5–5.1)
Sodium: 135 mmol/L (ref 135–145)
TCO2: 28 mmol/L (ref 22–32)

## 2023-03-14 LAB — COMPREHENSIVE METABOLIC PANEL
ALT: 16 U/L (ref 0–44)
AST: 52 U/L — ABNORMAL HIGH (ref 15–41)
Albumin: 2.9 g/dL — ABNORMAL LOW (ref 3.5–5.0)
Alkaline Phosphatase: 104 U/L (ref 38–126)
Anion gap: 10 (ref 5–15)
BUN: 13 mg/dL (ref 8–23)
CO2: 29 mmol/L (ref 22–32)
Calcium: 8.3 mg/dL — ABNORMAL LOW (ref 8.9–10.3)
Chloride: 98 mmol/L (ref 98–111)
Creatinine, Ser: 1.42 mg/dL — ABNORMAL HIGH (ref 0.61–1.24)
GFR, Estimated: 54 mL/min — ABNORMAL LOW (ref >60)
Glucose, Bld: 89 mg/dL (ref 70–99)
Potassium: 3.6 mmol/L (ref 3.5–5.1)
Sodium: 137 mmol/L (ref 135–145)
Total Bilirubin: 0.8 mg/dL (ref 0.3–1.2)
Total Protein: 6.3 g/dL — ABNORMAL LOW (ref 6.5–8.1)

## 2023-03-14 LAB — PROTIME-INR
INR: 1.2 (ref 0.8–1.2)
Prothrombin Time: 15.1 s (ref 11.4–15.2)

## 2023-03-14 LAB — URINALYSIS, ROUTINE W REFLEX MICROSCOPIC
Bilirubin Urine: NEGATIVE
Glucose, UA: NEGATIVE mg/dL
Hgb urine dipstick: NEGATIVE
Ketones, ur: NEGATIVE mg/dL
Leukocytes,Ua: NEGATIVE
Nitrite: NEGATIVE
Protein, ur: NEGATIVE mg/dL
Specific Gravity, Urine: 1.011 (ref 1.005–1.030)
pH: 6 (ref 5.0–8.0)

## 2023-03-14 LAB — CBC
HCT: 35.7 % — ABNORMAL LOW (ref 39.0–52.0)
Hemoglobin: 12.3 g/dL — ABNORMAL LOW (ref 13.0–17.0)
MCH: 34.6 pg — ABNORMAL HIGH (ref 26.0–34.0)
MCHC: 34.5 g/dL (ref 30.0–36.0)
MCV: 100.3 fL — ABNORMAL HIGH (ref 80.0–100.0)
Platelets: 159 10*3/uL (ref 150–400)
RBC: 3.56 MIL/uL — ABNORMAL LOW (ref 4.22–5.81)
RDW: 15 % (ref 11.5–15.5)
WBC: 3.6 10*3/uL — ABNORMAL LOW (ref 4.0–10.5)
nRBC: 0 % (ref 0.0–0.2)

## 2023-03-14 LAB — ETHANOL: Alcohol, Ethyl (B): 200 mg/dL — ABNORMAL HIGH (ref <10)

## 2023-03-14 LAB — I-STAT CG4 LACTIC ACID, ED: Lactic Acid, Venous: 2.5 mmol/L (ref 0.5–1.9)

## 2023-03-14 LAB — SAMPLE TO BLOOD BANK

## 2023-03-14 MED ORDER — FENTANYL CITRATE PF 50 MCG/ML IJ SOSY: 50.0000 ug | PREFILLED_SYRINGE | Freq: Once | INTRAMUSCULAR | Status: DC

## 2023-03-14 MED ORDER — IOHEXOL 350 MG/ML SOLN
75.0000 mL | Freq: Once | INTRAVENOUS | Status: AC | PRN
  Administered 2023-03-14: 75 mL via INTRAVENOUS

## 2023-03-14 MED ORDER — OXYCODONE HCL 5 MG PO TABS: 5.0000 mg | ORAL_TABLET | Freq: Four times a day (QID) | ORAL | 0 refills | Status: DC | PRN

## 2023-03-14 MED ORDER — FENTANYL CITRATE PF 50 MCG/ML IJ SOSY
50.0000 ug | PREFILLED_SYRINGE | Freq: Once | INTRAMUSCULAR | Status: AC
  Administered 2023-03-14: 50 ug via INTRAVENOUS
  Filled 2023-03-14: qty 1
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
## 2023-03-14 NOTE — ED Notes (Signed)
Patient returned to Room 35 from CT at this time. Patient escorted on cardiac monitor by [facility].

## 2023-03-14 NOTE — ED Notes (Signed)
X ray in room.

## 2023-03-14 NOTE — ED Notes (Signed)
Pt able to ambulate in the hallway with 1 person assistance. Pt did not complain of increase or new pain, weakness, or dizziness.
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
## 2023-03-14 NOTE — ED Notes (Addendum)
Patient arrived with no IV access, this RN made 2 attempts without success to obtain IV access. Requested assistance from [employer].
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
## 2023-03-14 NOTE — ED Notes (Signed)
Patient transported to CT by [facility] RN Trauma

## 2023-03-14 NOTE — ED Notes (Signed)
Pt provided sandwich bag per MD

## 2023-03-14 NOTE — ED Notes (Signed)
This RN was notified by NT that patient refused to ambulate for her and reported to her that if he got up that he would have a seizure. NT notified MD as well.
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
## 2023-08-03 ENCOUNTER — Emergency Department (HOSPITAL_COMMUNITY)

## 2023-08-03 ENCOUNTER — Other Ambulatory Visit: Payer: Self-pay

## 2023-08-03 ENCOUNTER — Emergency Department (HOSPITAL_COMMUNITY)
Admission: EM | Admit: 2023-08-03 | Discharge: 2023-08-03 | Disposition: A | Discharge status: Home / Self Care | Attending: Emergency Medicine | Admitting: Emergency Medicine

## 2023-08-03 ENCOUNTER — Encounter (HOSPITAL_COMMUNITY): Payer: Self-pay | Admitting: *Deleted

## 2023-08-03 ENCOUNTER — Encounter (HOSPITAL_COMMUNITY): Payer: Self-pay

## 2023-08-03 ENCOUNTER — Ambulatory Visit (HOSPITAL_COMMUNITY)
Admission: EM | Admit: 2023-08-03 | Discharge: 2023-08-03 | Disposition: A | Discharge status: Home / Self Care | Attending: Family Medicine | Admitting: Family Medicine

## 2023-08-03 DIAGNOSIS — G40909 Epilepsy, unspecified, not intractable, without status epilepticus: Secondary | ICD-10-CM

## 2023-08-03 DIAGNOSIS — S199XXA Unspecified injury of neck, initial encounter: Secondary | ICD-10-CM

## 2023-08-03 DIAGNOSIS — Y93E5 Activity, floor mopping and cleaning: Secondary | ICD-10-CM | POA: Diagnosis not present

## 2023-08-03 DIAGNOSIS — M542 Cervicalgia: Secondary | ICD-10-CM | POA: Diagnosis not present

## 2023-08-03 DIAGNOSIS — W01118A Fall on same level from slipping, tripping and stumbling with subsequent striking against other sharp object, initial encounter: Secondary | ICD-10-CM | POA: Insufficient documentation

## 2023-08-03 DIAGNOSIS — S0990XA Unspecified injury of head, initial encounter: Secondary | ICD-10-CM | POA: Diagnosis present

## 2023-08-03 MED ORDER — ONDANSETRON 4 MG PO TBDP
8.0000 mg | ORAL_TABLET | Freq: Once | ORAL | Status: AC
  Administered 2023-08-03: 8 mg via ORAL
  Filled 2023-08-03: qty 2

## 2023-08-03 MED ORDER — OXYCODONE-ACETAMINOPHEN 5-325 MG PO TABS
1.0000 | ORAL_TABLET | Freq: Once | ORAL | Status: AC
  Administered 2023-08-03: 1 via ORAL
  Filled 2023-08-03: qty 1

## 2023-08-03 NOTE — ED Notes (Signed)
 Patient transported to CT

## 2023-08-03 NOTE — ED Triage Notes (Addendum)
 Pt states he has been having seizures over and over since Monday and he was trying to clean his oven Monday night and fell into the over. He states he hurt his right should and neck he states he heard a crack. He states he hit the back of his head on the right side.   He states no seizure since Monday night.    He called [facility] today and they advised him to go to [facility] on Monday.    He states he has been taking tylneol for the pain last dose was last night. He is unsure of meds he takes and doesn't take. He says he skips some most days since he can't remember what to take or he doesn't like how they make him feel. He brought a box of meds he takes some times but his keppra isnt in this box.

## 2023-08-03 NOTE — ED Notes (Signed)
 Patient is being discharged from the Urgent Care and sent to the Emergency Department via [facility] . Per Erie Noe, MD, patient is in need of higher level of care due to fall. Patient is aware and verbalizes understanding of plan of care.  Vitals:   08/03/23 1429  BP: (!) 117/104  Pulse: 93  Resp: 18  Temp: 98.4 F (36.9 C)  SpO2: 98%

## 2023-08-03 NOTE — ED Triage Notes (Signed)
 Pt arrives via [facility] from Urgent Care due to c/o right lateral neck pain and right shoulder pain s/p falling backwards and hitting into his oven when he was cleaning it on Monday. He states he then fell backwards and hit his head on the refrigerator. Denies taking blood thinners. He states he may have had a seizure that caused him to fall. He states he has not had any seizures since Monday (3/10). He is A&Ox4, ambulatory with independent steady gait and GCS 15.  BP-140/98, HR-88, RR-18, 100% RA

## 2023-08-03 NOTE — ED Provider Notes (Signed)
 [place] EMERGENCY DEPARTMENT AT [facility] Provider Note   CSN: [id number] Arrival date & time: 08/03/23  1536     History  Chief Complaint  Patient presents with   Dennis Zhang    Marquist Binstock is a 68 y.o. male.  68 year old male presents with head and right side neck pain after hitting his head in an oven.  Patient states he was try to clean his abdomen and thinks he may have had a seizure.  States has had multiple seizures recently.  Notes that he is on Keppra but has been compliant.  This incident happened about 5 days ago.  Has had no symptoms since then.  Called his doctor and was told to come here.  Complains of sharp right-sided neck pain is worse with movement.  No distal numbness or tingling to his arms or legs at this time.  No nausea or vomiting.  Does not take any blood thinners       Home Medications Prior to Admission medications   Medication Sig Start Date End Date Taking? Authorizing Provider  acetaminophen (TYLENOL) 500 MG tablet TAKE TWO TABLETS BY MOUTH TWICE A DAY AS NEEDED FOR PAIN 04/21/20   [provider]  amLODipine (NORVASC) 5 MG tablet Take 1 tablet (5 mg total) by mouth daily. Patient not taking: Reported on 09/28/2020 07/29/17   Randel Pigg, Dorma Russell, MD  Carboxymethylcellulose Sodium 0.25 % SOLN INSTILL 1 DROP IN [facility] EYE 2-4 TIMES A DAY 10/13/19   [provider]  feeding supplement, ENSURE COMPLETE, (ENSURE COMPLETE) LIQD Take 237 mLs by mouth 3 (three) times daily between meals. 06/16/14   Rhetta Mura, MD  fluocinonide cream (LIDEX) 0.05 % APPLY SMALL AMOUNT TO AFFECTED AREA TWICE A DAY 06/28/20   [provider]  folic acid (FOLVITE) 1 MG tablet Take 1 tablet (1 mg total) by mouth daily. 05/24/16   Albertine Grates, MD  gabapentin (NEURONTIN) 300 MG capsule Take 1 capsule (300 mg total) by mouth 3 (three) times daily. Patient taking differently: Take 300 mg by mouth 4 (four) times daily as needed (for nerve pain.). 07/31/12    Schinlever, Santina Evans, PA-C  levETIRAcetam (KEPPRA) 500 MG tablet Take 1 tablet (500 mg total) by mouth 2 (two) times daily. Patient taking differently: Take 500 mg by mouth daily. 06/13/16   Elson Areas, PA-C  lipase/protease/amylase (CREON) 36000 UNITS CPEP capsule Take by mouth. 08/22/22   [provider]  lisinopril (ZESTRIL) 2.5 MG tablet Take by mouth. 01/30/23   [provider]  Magnesium Oxide 420 MG TABS Take 420 mg by mouth daily.     [provider]  meloxicam (MOBIC) 15 MG tablet Take 15 mg by mouth daily.    [provider]  Naphazoline HCl (CLEAR EYES OP) Place 1 drop into both eyes daily.    [provider]  oxyCODONE (ROXICODONE) 5 MG immediate release tablet Take 1 tablet (5 mg total) by mouth every 6 (six) hours as needed for severe pain (pain score 7-10). 03/14/23   Terrilee Files, MD  pantoprazole (PROTONIX) 40 MG tablet Take 1 tablet (40 mg total) by mouth daily. Patient not taking: Reported on 09/28/2020 05/24/16   Albertine Grates, MD  sildenafil (VIAGRA) 100 MG tablet Take 50 mg by mouth daily as needed for erectile dysfunction.     [provider]  tamsulosin (FLOMAX) 0.4 MG CAPS capsule Take 1 capsule (0.4 mg total) by mouth daily after breakfast. 05/23/16   Albertine Grates, MD  terbinafine (LAMISIL) 1 % cream APPLY MODERATE AMOUNT TO AFFECTED AREA TWICE A DAY 06/28/20   [provider]  thiamine 100 MG tablet Take 1 tablet (100 mg total) by mouth daily. 05/24/16   Albertine Grates, MD  urea (CARMOL) 20 % cream APPLY MODERATE AMOUNT TO AFFECTED AREA TWICE A DAY 06/28/20   [provider]      Allergies    Ace inhibitors, Aspirin, Metformin, Methocarbamol, and Tylenol [acetaminophen]    Review of Systems   Review of Systems  All other systems reviewed and are negative.   Physical Exam Updated Vital Signs BP (!) 154/90   Pulse 82   Temp 98.7 F (37.1 C)   Resp 16   Ht 1.88 m (6\' 2" )   Wt 67.1 kg   SpO2 98%   BMI  19.00 kg/m  Physical Exam Vitals and nursing note reviewed.  Constitutional:      General: He is not in acute distress.    Appearance: Normal appearance. He is well-developed. He is not toxic-appearing.  HENT:     Head: Normocephalic and atraumatic.  Eyes:     General: Lids are normal.     Conjunctiva/sclera: Conjunctivae normal.     Pupils: Pupils are equal, round, and reactive to light.  Neck:     Thyroid: No thyroid mass.     Trachea: No tracheal deviation.   Cardiovascular:     Rate and Rhythm: Normal rate and regular rhythm.     Heart sounds: Normal heart sounds. No murmur heard.    No gallop.  Pulmonary:     Effort: Pulmonary effort is normal. No respiratory distress.     Breath sounds: Normal breath sounds. No stridor. No decreased breath sounds, wheezing, rhonchi or rales.  Abdominal:     General: There is no distension.     Palpations: Abdomen is soft.     Tenderness: There is no abdominal tenderness. There is no rebound.  Musculoskeletal:        General: No tenderness.     Cervical back: Neck supple. Muscular tenderness present. Decreased range of motion.  Skin:    General: Skin is warm and dry.     Findings: No abrasion or rash.  Neurological:     General: No focal deficit present.     Mental Status: He is alert and oriented to person, place, and time. Mental status is at baseline.     GCS: GCS eye subscore is 4. GCS verbal subscore is 5. GCS motor subscore is 6.     Cranial Nerves: No cranial nerve deficit.     Sensory: No sensory deficit.     Motor: Motor function is intact.  Psychiatric:        Attention and Perception: Attention normal.        Speech: Speech normal.        Behavior: Behavior normal.     ED Results / Procedures / Treatments   Labs (all labs ordered are listed, but only abnormal results are displayed) Labs Reviewed - No data to display  EKG None  Radiology No results found.  Procedures Procedures    Medications Ordered in  ED Medications  oxyCODONE-acetaminophen (PERCOCET/ROXICET) 5-325 MG per tablet 1 tablet (has no administration in time range)  ondansetron (ZOFRAN-ODT) disintegrating tablet 8 mg (has no administration in time range)    ED Course/ Medical Decision Making/ A&P  Medical Decision Making Amount and/or Complexity of Data Reviewed Radiology: ordered.  Risk Prescription drug management.   CT of head and cervical spine without acute injury at this time.  Has been clinically stable here.  Will discharge back home        Final Clinical Impression(s) / ED Diagnoses Final diagnoses:  None    Rx / DC Orders ED Discharge Orders     None         Lorre Nick, MD 08/03/23 [phone]

## 2023-08-03 NOTE — ED Notes (Signed)
 Pt called his ride Mrs. Romeo Apple [phone], she states she can't come back and transport pt to ED. Provider aware and pt is unable to make it to the ED on foot. [facility] is called for transport.

## 2023-08-03 NOTE — ED Provider Notes (Signed)
 Patient is here today for severe neck pain after a fall 5 days ago.  He has known seizure  d/o, but not sure what meds he takes or if he is taking them currently.  He was bending over into his oven and had a seizure.  He hit is head and neck.  Since then the pain has been worsening at his neck and head.  He is unable to move his neck much at all due to pain.   Given his pain, and mechanism of injury, I think he should go to the ER.  He may need advance imaging that we are unable to do here.   He is aware.  He has attempted to call the person who gave him a ride, but she is unable to return to take him to the ER.  [facility] was called as a result.         Jannifer Franklin, MD 08/03/23 [phone]

## 2023-08-03 NOTE — ED Notes (Signed)
 Charge RN called and advised

## 2023-11-20 ENCOUNTER — Other Ambulatory Visit: Payer: Self-pay | Admitting: Urology

## 2023-12-03 NOTE — Progress Notes (Signed)
 Called patient regarding PST appointment at 1100. Patient did not know about appointment. He is busy right now but plan to call patient back this afternoon to complete interview over the phone and schedule blood work. Interview completed over the phone. Patient is scheduled for labs at 1000 on 12/06/23  COVID Vaccine Completed:  Date of COVID positive in last 90 days:  PCP - [facility] Cardiologist - n/a  Chest x-ray - 03/14/23 Epic EKG - yes at [facility], requetsed Stress Test - yes ECHO -  Cardiac Cath - n/a Pacemaker/ICD device last checked: n/a Spinal Cord Stimulator: n/a  Bowel Prep - no  Sleep Study - n/a CPAP -   Fasting Blood Sugar - n/a Checks Blood Sugar _____ times a day  Last dose of GLP1 agonist-  N/A GLP1 instructions:  Do not take after     Last dose of SGLT-2 inhibitors-  N/A SGLT-2 instructions:  Do not take after     Blood Thinner Instructions:  Last dose: n/a  Time: Aspirin  Instructions: Last Dose:  Activity level: Can go up a flight of stairs and perform activities of daily living without stopping and without symptoms of chest pain or shortness of breath.   Anesthesia review: seizure, syncope, hypotension, alcoholic hepatitis, aortic atherosclerosis, HTN  Patient denies shortness of breath, fever, cough and chest pain at PAT appointment  Patient verbalized understanding of instructions that were given to them at the PAT appointment. Patient was also instructed that they will need to review over the PAT instructions again at home before surgery.

## 2023-12-03 NOTE — Patient Instructions (Addendum)
 SURGICAL WAITING ROOM VISITATION  Patients having surgery or a procedure may have no more than 2 support people in the waiting area - these visitors may rotate.    Children under the age of 18 must have an adult with them who is not the patient.  Visitors with respiratory illnesses are discouraged from visiting and should remain at home.  If the patient needs to stay at the hospital during part of their recovery, the visitor guidelines for inpatient rooms apply. Pre-op nurse will coordinate an appropriate time for 1 support person to accompany patient in pre-op.  This support person may not rotate.    Please refer to the [facility] website for the visitor guidelines for Inpatients (after your surgery is over and you are in a regular room).    Your procedure is scheduled on: 12/11/23   Report to [facility] Main Entrance    Report to admitting at 1:45 PM   Call this number if you have problems the morning of surgery [phone]   Do not eat food or drink liquids :After Midnight.          If you have questions, please contact your surgeon's office.   FOLLOW BOWEL PREP AND ANY ADDITIONAL PRE OP INSTRUCTIONS YOU RECEIVED FROM YOUR SURGEON'S OFFICE!!!     Oral Hygiene is also important to reduce your risk of infection.                                    Remember - BRUSH YOUR TEETH THE MORNING OF SURGERY WITH YOUR REGULAR TOOTHPASTE  DENTURES WILL BE REMOVED PRIOR TO SURGERY PLEASE DO NOT APPLY Poly grip OR ADHESIVES!!!   Do NOT smoke after Midnight   Stop all vitamins and herbal supplements 7 days before surgery.   Take these medicines the morning of surgery with A SIP OF WATER: Tylenol , Gabapentin , Keppra , Tamsulosin                                You may not have any metal on your body including jewelry, and body piercing             Do not wear lotions, powders, cologne, or deodorant              Men may shave face and neck.   Do not bring valuables to the  hospital. [facility] IS NOT             RESPONSIBLE   FOR VALUABLES.   Contacts, glasses, dentures or bridgework may not be worn into surgery.  DO NOT BRING YOUR HOME MEDICATIONS TO THE HOSPITAL. PHARMACY WILL DISPENSE MEDICATIONS LISTED ON YOUR MEDICATION LIST TO YOU DURING YOUR ADMISSION IN THE HOSPITAL!    Patients discharged on the day of surgery will not be allowed to drive home.  Someone NEEDS to stay with you for the first 24 hours after anesthesia.              Please read over the following fact sheets you were given: IF YOU HAVE QUESTIONS ABOUT YOUR PRE-OP INSTRUCTIONS PLEASE CALL [phone]   If you received a COVID test during your pre-op visit  it is requested that you wear a mask when out in public, stay away from anyone that may not be feeling well and notify your surgeon if you develop symptoms.  If you test positive for Covid or have been in contact with anyone that has tested positive in the last 10 days please notify you surgeon.    Page - Preparing for Surgery Before surgery, you can play an important role.  Because skin is not sterile, your skin needs to be as free of germs as possible.  You can reduce the number of germs on your skin by washing with CHG (chlorahexidine gluconate) soap before surgery.  CHG is an antiseptic cleaner which kills germs and bonds with the skin to continue killing germs even after washing. Please DO NOT use if you have an allergy to CHG or antibacterial soaps.  If your skin becomes reddened/irritated stop using the CHG and inform your nurse when you arrive at Short Stay. Do not shave (including legs and underarms) for at least 48 hours prior to the first CHG shower.  You may shave your face/neck.  Please follow these instructions carefully:  1.  Shower with CHG Soap the night before surgery and the  morning of surgery.  2.  If you choose to wash your hair, wash your hair first as usual with your normal  shampoo.  3.  After you  shampoo, rinse your hair and body thoroughly to remove the shampoo.                             4.  Use CHG as you would any other liquid soap.  You can apply chg directly to the skin and wash.  Gently with a scrungie or clean washcloth.  5.  Apply the CHG Soap to your body ONLY FROM THE NECK DOWN.   Do   not use on face/ open                           Wound or open sores. Avoid contact with eyes, ears mouth and   genitals (private parts).                       Wash face,  Genitals (private parts) with your normal soap.             6.  Wash thoroughly, paying special attention to the area where your    surgery  will be performed.  7.  Thoroughly rinse your body with warm water from the neck down.  8.  DO NOT shower/wash with your normal soap after using and rinsing off the CHG Soap.                9.  Pat yourself dry with a clean towel.            10.  Wear clean pajamas.            11.  Place clean sheets on your bed the night of your first shower and do not  sleep with pets. Day of Surgery : Do not apply any lotions/deodorants the morning of surgery.  Please wear clean clothes to the hospital/surgery center.  FAILURE TO FOLLOW THESE INSTRUCTIONS MAY RESULT IN THE CANCELLATION OF YOUR SURGERY  PATIENT SIGNATURE_________________________________  NURSE SIGNATURE__________________________________  ________________________________________________________________________

## 2023-12-04 ENCOUNTER — Encounter (HOSPITAL_COMMUNITY)
Admission: RE | Admit: 2023-12-04 | Discharge: 2023-12-04 | Disposition: A | Discharge status: Home / Self Care | Source: Ambulatory Visit | Attending: Urology | Admitting: Urology

## 2023-12-04 ENCOUNTER — Encounter (HOSPITAL_COMMUNITY): Payer: Self-pay

## 2023-12-04 ENCOUNTER — Other Ambulatory Visit: Payer: Self-pay

## 2023-12-04 VITALS — Ht 74.0 in | Wt 148.0 lb

## 2023-12-04 DIAGNOSIS — R569 Unspecified convulsions: Secondary | ICD-10-CM | POA: Insufficient documentation

## 2023-12-04 DIAGNOSIS — K7010 Alcoholic hepatitis without ascites: Secondary | ICD-10-CM | POA: Insufficient documentation

## 2023-12-04 DIAGNOSIS — Z01812 Encounter for preprocedural laboratory examination: Secondary | ICD-10-CM | POA: Diagnosis not present

## 2023-12-04 DIAGNOSIS — Z01818 Encounter for other preprocedural examination: Secondary | ICD-10-CM | POA: Diagnosis present

## 2023-12-04 HISTORY — DX: Inflammatory liver disease, unspecified: K75.9

## 2023-12-04 HISTORY — DX: Depression, unspecified: F32.A

## 2023-12-06 ENCOUNTER — Encounter (HOSPITAL_COMMUNITY)
Admission: RE | Admit: 2023-12-06 | Discharge: 2023-12-06 | Disposition: A | Discharge status: Home / Self Care | Source: Ambulatory Visit

## 2023-12-06 NOTE — Progress Notes (Signed)
 Called patient regarding lab appointment at 1000. No answer. Left voicemail with call back number

## 2023-12-07 ENCOUNTER — Encounter (HOSPITAL_COMMUNITY): Admission: RE | Admit: 2023-12-07 | Source: Ambulatory Visit

## 2023-12-10 ENCOUNTER — Encounter (HOSPITAL_COMMUNITY): Payer: Self-pay

## 2023-12-10 ENCOUNTER — Encounter (HOSPITAL_COMMUNITY)
Admission: RE | Admit: 2023-12-10 | Discharge: 2023-12-10 | Disposition: A | Discharge status: Home / Self Care | Source: Ambulatory Visit | Attending: Urology | Admitting: Urology

## 2023-12-10 DIAGNOSIS — Z01812 Encounter for preprocedural laboratory examination: Secondary | ICD-10-CM | POA: Diagnosis not present

## 2023-12-10 DIAGNOSIS — K7010 Alcoholic hepatitis without ascites: Secondary | ICD-10-CM | POA: Diagnosis not present

## 2023-12-10 DIAGNOSIS — Z01818 Encounter for other preprocedural examination: Secondary | ICD-10-CM | POA: Diagnosis present

## 2023-12-10 DIAGNOSIS — R569 Unspecified convulsions: Secondary | ICD-10-CM | POA: Insufficient documentation

## 2023-12-10 LAB — CBC
HCT: 35.3 % — ABNORMAL LOW (ref 39.0–52.0)
Hemoglobin: 11.8 g/dL — ABNORMAL LOW (ref 13.0–17.0)
MCH: 34.1 pg — ABNORMAL HIGH (ref 26.0–34.0)
MCHC: 33.4 g/dL (ref 30.0–36.0)
MCV: 102 fL — ABNORMAL HIGH (ref 80.0–100.0)
Platelets: 164 K/uL (ref 150–400)
RBC: 3.46 MIL/uL — ABNORMAL LOW (ref 4.22–5.81)
RDW: 15.6 % — ABNORMAL HIGH (ref 11.5–15.5)
WBC: 6.2 K/uL (ref 4.0–10.5)
nRBC: 0 % (ref 0.0–0.2)

## 2023-12-10 LAB — COMPREHENSIVE METABOLIC PANEL WITH GFR
ALT: 16 U/L (ref 0–44)
AST: 51 U/L — ABNORMAL HIGH (ref 15–41)
Albumin: 2.6 g/dL — ABNORMAL LOW (ref 3.5–5.0)
Alkaline Phosphatase: 159 U/L — ABNORMAL HIGH (ref 38–126)
Anion gap: 19 — ABNORMAL HIGH (ref 5–15)
BUN: 14 mg/dL (ref 8–23)
CO2: 23 mmol/L (ref 22–32)
Calcium: 8 mg/dL — ABNORMAL LOW (ref 8.9–10.3)
Chloride: 94 mmol/L — ABNORMAL LOW (ref 98–111)
Creatinine, Ser: 1.79 mg/dL — ABNORMAL HIGH (ref 0.61–1.24)
GFR, Estimated: 41 mL/min — ABNORMAL LOW (ref >60)
Glucose, Bld: 188 mg/dL — ABNORMAL HIGH (ref 70–99)
Potassium: 2.9 mmol/L — ABNORMAL LOW (ref 3.5–5.1)
Sodium: 136 mmol/L (ref 135–145)
Total Bilirubin: 1.3 mg/dL — ABNORMAL HIGH (ref 0.0–1.2)
Total Protein: 6.8 g/dL (ref 6.5–8.1)

## 2023-12-10 NOTE — Progress Notes (Addendum)
 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   Seen by PCP at the [facility] on 11/19/23. All issues stable. Has hx of seizures, on Keppra . Last seizure was ~ March 2025. Per his PCP: Patient denies any headache, vision changes, chest pain, shortness of breath, abdominal pain, changes in stools, lower extremity edema. Denies any recent illness, fever or chills. Overall mood has been good  Hx of cirrhosis and chronic pancreatitis. Followed by hepatology per his PCP.  Patient came in for labs on 7/21 for labs. Patient takes BP meds as needed? BP was noted to be on the low side, took his BP meds. He is asymptomatic.    VS: Ht 6' 2 (1.88 m)   Wt 67.1 kg   BMI 19.00 kg/m   PROVIDERS: Clinic, [facility]   LABS: Labs for DOS   IMAGES: CT chest/abdomen/pelvis 03/14/2023:  IMPRESSION: 1. No acute intrathoracic, intra-abdominal, intrapelvic traumatic injury. 2. No acute fracture or traumatic malalignment of the thoracic or lumbar spine. 3. Other imaging findings of potential clinical significance: Cholelithiasis with no acute cholecystitis. Chronic pancreatitis. Aortic Atherosclerosis (ICD10-I70.0). EKG:   CV:  Past Medical History:  Diagnosis Date   Arthritis    Chronic pain    Chronic pain    Depression    Diabetes mellitus without complication (HCC)    borderline   Hepatitis    Hyperlipidemia    Hypertension    Seizures (HCC)      Past Surgical History:  Procedure Laterality Date   ABDOMINAL SURGERY     APPENDECTOMY     CYST EXCISION     HEMORROIDECTOMY      MEDICATIONS:  acetaminophen  (TYLENOL ) 500 MG tablet   amLODipine  (NORVASC ) 5 MG tablet   Carboxymethylcellulose Sodium 0.25 % SOLN   feeding supplement, ENSURE COMPLETE, (ENSURE COMPLETE) LIQD   fluocinonide cream (LIDEX) 0.05 %   folic acid  (FOLVITE ) 1 MG tablet   gabapentin  (NEURONTIN ) 300 MG capsule   levETIRAcetam  (KEPPRA ) 500 MG tablet   lipase/protease/amylase (CREON) 36000 UNITS CPEP capsule   lisinopril  (ZESTRIL ) 2.5 MG tablet   Magnesium  Oxide 420 MG TABS   meloxicam (MOBIC) 15 MG tablet   Naphazoline HCl (CLEAR EYES OP)   oxyCODONE  (ROXICODONE ) 5 MG immediate release tablet   pantoprazole  (PROTONIX ) 40 MG tablet   sildenafil (VIAGRA) 100 MG tablet   tamsulosin  (FLOMAX ) 0.4 MG CAPS capsule   terbinafine (LAMISIL) 1 % cream   thiamine  100 MG tablet   urea (CARMOL) 20 % cream   No current facility-administered medications for this encounter.   Burnard CHRISTELLA Odis DEVONNA [facility] Short Stay/Anesthesiology [facility] Phone [phone] 12/10/2023 11:17 AM

## 2023-12-10 NOTE — Progress Notes (Addendum)
 Patient did not come in today for his preop labs. I called Nena at Dr. Gabe office and left her a VM about this.   1445  patient finally arrived to get his PST labs drawn. Vernell had already given him instructions of DOS

## 2023-12-10 NOTE — Anesthesia Preprocedure Evaluation (Signed)
 Anesthesia Evaluation  Patient identified by MRN, date of birth, ID band Patient awake    Reviewed: Allergy & Precautions, NPO status , Patient's Chart, lab work & pertinent test results  Airway Mallampati: II  TM Distance: >3 FB Neck ROM: Full    Dental no notable dental hx. (+) Edentulous Upper, Edentulous Lower   Pulmonary Current Smoker   Pulmonary exam normal        Cardiovascular hypertension, Pt. on medications  Rhythm:Regular Rate:Normal     Neuro/Psych Seizures -,    Depression       GI/Hepatic ,GERD  Medicated,,  Endo/Other  diabetes    Renal/GU Renal disease  negative genitourinary   Musculoskeletal  (+) Arthritis , Osteoarthritis,    Abdominal Normal abdominal exam  (+)   Peds  Hematology negative hematology ROS (+)   Anesthesia Other Findings   Reproductive/Obstetrics                              Anesthesia Physical Anesthesia Plan  ASA: 3 and emergent  Anesthesia Plan: General   Post-op Pain Management:    Induction: Intravenous and Rapid sequence  PONV Risk Score and Plan: 1 and Ondansetron  and Dexamethasone   Airway Management Planned: Mask and Oral ETT  Additional Equipment: None  Intra-op Plan:   Post-operative Plan: Extubation in OR  Informed Consent: I have reviewed the patients History and Physical, chart, labs and discussed the procedure including the risks, benefits and alternatives for the proposed anesthesia with the patient or authorized representative who has indicated his/her understanding and acceptance.     Dental advisory given  Plan Discussed with: CRNA  Anesthesia Plan Comments: (See PAT note from 7/15  DOS note: Poor historian with NPO status, surgeon declaring emergent, will proceed with RSI GA ETT)         Anesthesia Quick Evaluation

## 2023-12-10 NOTE — H&P (Signed)
 CC/HPI: cc: phimosis, penile tumor   11/09/23: 68 year old man referred from the [place] for phimosis and penile mass. Patient is having difficulty retracting foreskin. He also has a small fungating growth on the foreskin. He is ready to move forward with a circumcision and excisional biopsy. He does not take any blood thinners. He does have chronic back pain.     ALLERGIES: No Known Allergies    MEDICATIONS: Lisinopril  2.5 MG Tablet  metFORMIN HCl 500 MG Tablet  Tamsulosin  HCl 0.4 MG Capsule  Acetaminophen  500 MG Tablet  Albuterol  Sulfate HFA 108 (90 Base) MCG/ACT Aerosol Solution  Artificial Tears  Aspirin  81 81 MG Tablet Delayed Release  Atorvastatin Calcium 10 MG Tablet  Cholecalciferol  Diclofenac Sodium 1 % Gel  DULoxetine HCl 30 MG Capsule Delayed Release Particles  FreeStyle Lancets Miscellaneous  Gabapentin  300 MG Capsule  Magnesium  Oxide -Mg Supplement 420 (252 Mg) MG Tablet  Multivitamin  Potassium Chloride  Crys ER 15 MEQ Tablet Extended Release  Sildenafil Citrate 100 MG Tablet  Thiamine  HCl 100 MG Tablet  Vitamin D (Cholecalciferol) 50 MCG (2000 UT) Capsule     GU PSH: No GU PSH    NON-GU PSH: No Non-GU PSH    GU PMH: No GU PMH    NON-GU PMH: No Non-GU PMH    FAMILY HISTORY: No Family History    SOCIAL HISTORY: No Social History    REVIEW OF SYSTEMS:    GU Review Male:   Patient denies frequent urination, hard to postpone urination, burning/ pain with urination, get up at night to urinate, leakage of urine, stream starts and stops, trouble starting your stream, have to strain to urinate , erection problems, and penile pain.  Gastrointestinal (Upper):   Patient denies nausea, vomiting, and indigestion/ heartburn.  Gastrointestinal (Lower):   Patient denies diarrhea and constipation.  Constitutional:   Patient denies fever, night sweats, weight loss, and fatigue.  Skin:   Patient denies skin rash/ lesion and itching.  Eyes:   Patient denies blurred vision and  double vision.  Ears/ Nose/ Throat:   Patient denies sore throat and sinus problems.  Hematologic/Lymphatic:   Patient denies swollen glands and easy bruising.  Cardiovascular:   Patient denies leg swelling and chest pains.  Respiratory:   Patient denies cough and shortness of breath.  Endocrine:   Patient denies excessive thirst.  Musculoskeletal:   Patient denies back pain and joint pain.  Neurological:   Patient denies headaches and dizziness.  Psychologic:   Patient denies depression and anxiety.   VITAL SIGNS:      11/09/2023 01:55 PM  Weight 143 lb / 64.86 kg  Height 74 in / 187.96 cm  BP 101/65 mmHg  Pulse 85 /min  BMI 18.4 kg/m   GU PHYSICAL EXAMINATION:      Notes: Uncircumcised, difficulty retracting foreskin, 5 mm fungating mass right lateral shaft but unable to clearly visualize due to phimosis   MULTI-SYSTEM PHYSICAL EXAMINATION:    Constitutional: Well-nourished. No physical deformities. Normally developed. Good grooming.  Neck: Neck symmetrical, not swollen. Normal tracheal position.  Respiratory: No labored breathing, no use of accessory muscles.   Skin: No paleness, no jaundice, no cyanosis. No lesion, no ulcer, no rash.  Neurologic / Psychiatric: Oriented to time, oriented to place, oriented to person. No depression, no anxiety, no agitation.  Eyes: Normal conjunctivae. Normal eyelids.  Ears, Nose, Mouth, and Throat: Left ear no scars, no lesions, no masses. Right ear no scars, no lesions, no masses. Nose  no scars, no lesions, no masses. Normal hearing. Normal lips.  Musculoskeletal: Normal gait and station of head and neck.     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 Phimosis/penile mass:  - Unable to fully retract foreskin to visualize penile mass  -[facility] and is ready to move forward this  - Risks and benefits including but not limited to pain, bleeding, infection, need for additional treatment, poor cosmetic result, damage to surrounding structures discussed with patient

## 2023-12-11 ENCOUNTER — Other Ambulatory Visit: Payer: Self-pay

## 2023-12-11 ENCOUNTER — Encounter (HOSPITAL_COMMUNITY)
Admission: RE | Disposition: A | Discharge status: Home / Self Care | Payer: Self-pay | Source: Home / Self Care | Attending: Urology

## 2023-12-11 ENCOUNTER — Observation Stay (HOSPITAL_COMMUNITY)
Admission: RE | Admit: 2023-12-11 | Discharge: 2023-12-12 | Disposition: A | Discharge status: Home / Self Care | Attending: Urology | Admitting: Urology

## 2023-12-11 ENCOUNTER — Ambulatory Visit (HOSPITAL_COMMUNITY): Admitting: Certified Registered Nurse Anesthetist

## 2023-12-11 ENCOUNTER — Encounter (HOSPITAL_COMMUNITY): Payer: Self-pay | Admitting: Urology

## 2023-12-11 ENCOUNTER — Ambulatory Visit (HOSPITAL_COMMUNITY): Payer: Self-pay | Admitting: Medical

## 2023-12-11 DIAGNOSIS — N471 Phimosis: Secondary | ICD-10-CM | POA: Diagnosis present

## 2023-12-11 DIAGNOSIS — E119 Type 2 diabetes mellitus without complications: Secondary | ICD-10-CM | POA: Insufficient documentation

## 2023-12-11 DIAGNOSIS — I10 Essential (primary) hypertension: Secondary | ICD-10-CM | POA: Diagnosis not present

## 2023-12-11 DIAGNOSIS — Z7982 Long term (current) use of aspirin: Secondary | ICD-10-CM | POA: Insufficient documentation

## 2023-12-11 DIAGNOSIS — N4889 Other specified disorders of penis: Secondary | ICD-10-CM | POA: Insufficient documentation

## 2023-12-11 DIAGNOSIS — F17210 Nicotine dependence, cigarettes, uncomplicated: Secondary | ICD-10-CM

## 2023-12-11 DIAGNOSIS — Z9889 Other specified postprocedural states: Principal | ICD-10-CM

## 2023-12-11 DIAGNOSIS — Z79899 Other long term (current) drug therapy: Secondary | ICD-10-CM | POA: Diagnosis not present

## 2023-12-11 DIAGNOSIS — Z87898 Personal history of other specified conditions: Secondary | ICD-10-CM

## 2023-12-11 DIAGNOSIS — Z7984 Long term (current) use of oral hypoglycemic drugs: Secondary | ICD-10-CM | POA: Diagnosis not present

## 2023-12-11 HISTORY — PX: PENILE BIOPSY: SHX6013

## 2023-12-11 HISTORY — PX: CIRCUMCISION: SHX1350

## 2023-12-11 LAB — GLUCOSE, CAPILLARY
Glucose-Capillary: 101 mg/dL — ABNORMAL HIGH (ref 70–99)
Glucose-Capillary: 143 mg/dL — ABNORMAL HIGH (ref 70–99)

## 2023-12-11 LAB — POCT I-STAT, CHEM 8
BUN: 16 mg/dL (ref 8–23)
Calcium, Ion: 1.07 mmol/L — ABNORMAL LOW (ref 1.15–1.40)
Chloride: 91 mmol/L — ABNORMAL LOW (ref 98–111)
Creatinine, Ser: 2.1 mg/dL — ABNORMAL HIGH (ref 0.61–1.24)
Glucose, Bld: 176 mg/dL — ABNORMAL HIGH (ref 70–99)
HCT: 36 % — ABNORMAL LOW (ref 39.0–52.0)
Hemoglobin: 12.2 g/dL — ABNORMAL LOW (ref 13.0–17.0)
Potassium: 2.9 mmol/L — ABNORMAL LOW (ref 3.5–5.1)
Sodium: 138 mmol/L (ref 135–145)
TCO2: 28 mmol/L (ref 22–32)

## 2023-12-11 SURGERY — CIRCUMCISION, ADULT
Anesthesia: General

## 2023-12-11 MED ORDER — EPHEDRINE SULFATE-NACL 50-0.9 MG/10ML-% IV SOSY
PREFILLED_SYRINGE | INTRAVENOUS | Status: DC | PRN
  Administered 2023-12-11 (×2): 5 mg via INTRAVENOUS

## 2023-12-11 MED ORDER — PHENYLEPHRINE 80 MCG/ML (10ML) SYRINGE FOR IV PUSH (FOR BLOOD PRESSURE SUPPORT)
PREFILLED_SYRINGE | INTRAVENOUS | Status: AC
  Filled 2023-12-11: qty 10

## 2023-12-11 MED ORDER — PROPOFOL 10 MG/ML IV BOLUS
INTRAVENOUS | Status: AC
Start: 2023-12-11 — End: 2023-12-11
  Filled 2023-12-11: qty 20

## 2023-12-11 MED ORDER — STERILE WATER FOR IRRIGATION IR SOLN
Status: DC | PRN
  Administered 2023-12-11: 1000 mL

## 2023-12-11 MED ORDER — DEXAMETHASONE SODIUM PHOSPHATE 4 MG/ML IJ SOLN
INTRAMUSCULAR | Status: DC | PRN
  Administered 2023-12-11: 5 mg via INTRAVENOUS

## 2023-12-11 MED ORDER — SUCCINYLCHOLINE CHLORIDE 200 MG/10ML IV SOSY
PREFILLED_SYRINGE | INTRAVENOUS | Status: AC
  Filled 2023-12-11: qty 10

## 2023-12-11 MED ORDER — PHENYLEPHRINE 80 MCG/ML (10ML) SYRINGE FOR IV PUSH (FOR BLOOD PRESSURE SUPPORT)
PREFILLED_SYRINGE | INTRAVENOUS | Status: DC | PRN
  Administered 2023-12-11 (×4): 160 ug via INTRAVENOUS

## 2023-12-11 MED ORDER — OXYCODONE HCL 5 MG PO TABS
5.0000 mg | ORAL_TABLET | Freq: Four times a day (QID) | ORAL | Status: DC | PRN
  Administered 2023-12-11 – 2023-12-12 (×3): 5 mg via ORAL
  Filled 2023-12-11 (×3): qty 1

## 2023-12-11 MED ORDER — CELECOXIB 200 MG PO CAPS: 200.0000 mg | ORAL_CAPSULE | Freq: Every day | ORAL | Status: DC

## 2023-12-11 MED ORDER — SODIUM CHLORIDE 0.9 % IV SOLN: 250.0000 mL | INTRAVENOUS | Status: DC | PRN

## 2023-12-11 MED ORDER — SODIUM CHLORIDE 0.9% FLUSH: 3.0000 mL | INTRAVENOUS | Status: DC | PRN

## 2023-12-11 MED ORDER — LEVETIRACETAM 500 MG PO TABS: 500.0000 mg | ORAL_TABLET | Freq: Every day | ORAL | Status: DC

## 2023-12-11 MED ORDER — SODIUM CHLORIDE 0.9% FLUSH
3.0000 mL | Freq: Two times a day (BID) | INTRAVENOUS | Status: DC
Start: 2023-12-11 — End: 2023-12-12
  Administered 2023-12-11: 3 mL via INTRAVENOUS

## 2023-12-11 MED ORDER — ONDANSETRON HCL 4 MG/2ML IJ SOLN
INTRAMUSCULAR | Status: DC | PRN
Start: 2023-12-11 — End: 2023-12-11
  Administered 2023-12-11: 4 mg via INTRAVENOUS

## 2023-12-11 MED ORDER — FENTANYL CITRATE (PF) 100 MCG/2ML IJ SOLN
INTRAMUSCULAR | Status: AC
  Filled 2023-12-11: qty 2

## 2023-12-11 MED ORDER — CEFAZOLIN SODIUM-DEXTROSE 2-4 GM/100ML-% IV SOLN
2.0000 g | INTRAVENOUS | Status: AC
  Administered 2023-12-11: 2 g via INTRAVENOUS
  Filled 2023-12-11: qty 100

## 2023-12-11 MED ORDER — TRAMADOL HCL 50 MG PO TABS: 50.0000 mg | ORAL_TABLET | Freq: Four times a day (QID) | ORAL | 0 refills | Status: DC | PRN

## 2023-12-11 MED ORDER — OXYCODONE HCL 5 MG/5ML PO SOLN: 5.0000 mg | Freq: Once | ORAL | Status: AC | PRN

## 2023-12-11 MED ORDER — OXYCODONE HCL 5 MG PO TABS
ORAL_TABLET | ORAL | Status: AC
  Filled 2023-12-11: qty 1

## 2023-12-11 MED ORDER — BUPIVACAINE HCL (PF) 0.25 % IJ SOLN
INTRAMUSCULAR | Status: DC | PRN
  Administered 2023-12-11: 10 mL

## 2023-12-11 MED ORDER — ONDANSETRON HCL 4 MG/2ML IJ SOLN
INTRAMUSCULAR | Status: AC
  Filled 2023-12-11: qty 2

## 2023-12-11 MED ORDER — TAMSULOSIN HCL 0.4 MG PO CAPS: 0.4000 mg | ORAL_CAPSULE | Freq: Every day | ORAL | Status: DC

## 2023-12-11 MED ORDER — GABAPENTIN 300 MG PO CAPS: 300.0000 mg | ORAL_CAPSULE | Freq: Four times a day (QID) | ORAL | Status: DC | PRN

## 2023-12-11 MED ORDER — BUPIVACAINE HCL (PF) 0.25 % IJ SOLN
INTRAMUSCULAR | Status: AC
Start: 2023-12-11 — End: 2023-12-11
  Filled 2023-12-11: qty 30

## 2023-12-11 MED ORDER — ORAL CARE MOUTH RINSE: 15.0000 mL | Freq: Once | OROMUCOSAL | Status: AC

## 2023-12-11 MED ORDER — AMLODIPINE BESYLATE 5 MG PO TABS
5.0000 mg | ORAL_TABLET | Freq: Every day | ORAL | Status: DC
Start: 2023-12-12 — End: 2023-12-12

## 2023-12-11 MED ORDER — PROPOFOL 10 MG/ML IV BOLUS
INTRAVENOUS | Status: DC | PRN
  Administered 2023-12-11: 140 mg via INTRAVENOUS

## 2023-12-11 MED ORDER — LIDOCAINE HCL 1 % IJ SOLN
INTRAMUSCULAR | Status: AC
  Filled 2023-12-11: qty 20

## 2023-12-11 MED ORDER — FENTANYL CITRATE (PF) 100 MCG/2ML IJ SOLN
INTRAMUSCULAR | Status: DC | PRN
  Administered 2023-12-11 (×2): 50 ug via INTRAVENOUS

## 2023-12-11 MED ORDER — HYDROMORPHONE HCL 1 MG/ML IJ SOLN: 0.2500 mg | INTRAMUSCULAR | Status: DC | PRN

## 2023-12-11 MED ORDER — LIDOCAINE HCL 1 % IJ SOLN
INTRAMUSCULAR | Status: DC | PRN
  Administered 2023-12-11: 10 mL

## 2023-12-11 MED ORDER — OXYCODONE HCL 5 MG PO TABS
5.0000 mg | ORAL_TABLET | Freq: Once | ORAL | Status: AC | PRN
  Administered 2023-12-11: 5 mg via ORAL

## 2023-12-11 MED ORDER — DEXAMETHASONE SODIUM PHOSPHATE 10 MG/ML IJ SOLN
INTRAMUSCULAR | Status: AC
  Filled 2023-12-11: qty 1

## 2023-12-11 MED ORDER — CELECOXIB 200 MG PO CAPS
200.0000 mg | ORAL_CAPSULE | Freq: Every day | ORAL | Status: DC
  Administered 2023-12-11: 200 mg via ORAL
  Filled 2023-12-11 (×2): qty 1

## 2023-12-11 MED ORDER — LACTATED RINGERS IV SOLN: INTRAVENOUS | Status: DC

## 2023-12-11 MED ORDER — SUCCINYLCHOLINE CHLORIDE 200 MG/10ML IV SOSY
PREFILLED_SYRINGE | INTRAVENOUS | Status: DC | PRN
  Administered 2023-12-11: 120 mg via INTRAVENOUS

## 2023-12-11 MED ORDER — EPHEDRINE 5 MG/ML INJ
INTRAVENOUS | Status: AC
  Filled 2023-12-11: qty 5

## 2023-12-11 MED ORDER — CHLORHEXIDINE GLUCONATE 0.12 % MT SOLN
15.0000 mL | Freq: Once | OROMUCOSAL | Status: AC
  Administered 2023-12-11: 15 mL via OROMUCOSAL

## 2023-12-11 MED ORDER — PANTOPRAZOLE SODIUM 40 MG PO TBEC
40.0000 mg | DELAYED_RELEASE_TABLET | Freq: Every day | ORAL | Status: DC
Start: 2023-12-12 — End: 2023-12-12

## 2023-12-11 SURGICAL SUPPLY — 23 items
BAG COUNTER SPONGE SURGICOUNT (BAG) IMPLANT
BLADE SURG 15 STRL LF DISP TIS (BLADE) ×1 IMPLANT
BNDG COHESIVE 2X5 TAN ST LF (GAUZE/BANDAGES/DRESSINGS) ×1 IMPLANT
BNDG STRETCH GAUZE 3IN X12FT (GAUZE/BANDAGES/DRESSINGS) IMPLANT
COVER SURGICAL LIGHT HANDLE (MISCELLANEOUS) ×1 IMPLANT
DRAPE LAPAROTOMY T 98X78 PEDS (DRAPES) ×1 IMPLANT
ELECT REM PT RETURN 15FT ADLT (MISCELLANEOUS) ×1 IMPLANT
GAUZE SPONGE 4X4 12PLY STRL (GAUZE/BANDAGES/DRESSINGS) ×1 IMPLANT
GAUZE XEROFORM 1X8 LF (GAUZE/BANDAGES/DRESSINGS) ×1 IMPLANT
GLOVE BIO SURGEON STRL SZ 6.5 (GLOVE) ×1 IMPLANT
GOWN STRL REUS W/ TWL LRG LVL3 (GOWN DISPOSABLE) ×1 IMPLANT
KIT BASIN OR (CUSTOM PROCEDURE TRAY) ×1 IMPLANT
KIT TURNOVER KIT A (KITS) ×1 IMPLANT
NDL HYPO 22X1.5 SAFETY MO (MISCELLANEOUS) ×1 IMPLANT
NEEDLE HYPO 22X1.5 SAFETY MO (MISCELLANEOUS) ×1 IMPLANT
NS IRRIG 1000ML POUR BTL (IV SOLUTION) IMPLANT
PACK BASIC VI WITH GOWN DISP (CUSTOM PROCEDURE TRAY) ×1 IMPLANT
PENCIL SMOKE EVACUATOR (MISCELLANEOUS) IMPLANT
SUT CHROMIC 3 0 SH 27 (SUTURE) ×3 IMPLANT
SUT CHROMIC 4 0 SH 27 (SUTURE) ×1 IMPLANT
SYR BULB EAR ULCER 3OZ GRN STR (SYRINGE) ×1 IMPLANT
SYR CONTROL 10ML LL (SYRINGE) IMPLANT
TOWEL OR 17X26 10 PK STRL BLUE (TOWEL DISPOSABLE) ×1 IMPLANT

## 2023-12-11 NOTE — Interval H&P Note (Signed)
 History and Physical Interval Note:  12/11/2023 2:43 PM  Dennis Zhang  has presented today for surgery, with the diagnosis of PHIMOSIS, PENILE MASS.  The various methods of treatment have been discussed with the patient and family. After consideration of risks, benefits and other options for treatment, the patient has consented to  Procedure(s) with comments: CIRCUMCISION, ADULT (N/A) - CIRCUMCISION, EXCISIONAL PENILE BIOPSY BIOPSY, PENIS (N/A) as a surgical intervention.  The patient's history has been reviewed, patient examined, no change in status, stable for surgery.  I have reviewed the patient's chart and labs.  Questions were answered to the patient's satisfaction.     Dennis Zhang

## 2023-12-11 NOTE — Discharge Instructions (Signed)
Postoperative instructions for circumcision ° °Wound: ° °In most cases your incision will have absorbable sutures that run along the course of your incision and will dissolve within the first 10-20 days. Some will fall out even earlier. Expect some redness as the sutures dissolved but this should occur only around the sutures. If there is generalized redness, especially with increasing pain or swelling, let us know. The penis will very likely get "black and blue" as the blood in the tissues spread. Sometimes the whole penis will turn colors. The black and blue is followed by a yellow and brown color. In time, all the discoloration will go away. ° °Diet: ° °You may return to your normal diet within 24 hours following your surgery. You may note some mild nausea and possibly vomiting the first 6-8 hours following surgery. This is usually due to the side effects of anesthesia, and will disappear quite soon. I would suggest clear liquids and a very light meal the first evening following your surgery. ° °Activity: ° °Your physical activity should be restricted the first 48 hours. During that time you should remain relatively inactive, moving about only when necessary. During the first 7-10 days following surgery he should avoid lifting any heavy objects (anything greater than 15 pounds), and avoid strenuous exercise. If you work, ask us specifically about your restrictions, both for work and home. We will write a note to your employer if needed. ° °Ice packs can be placed on and off over the penis for the first 48 hours to help relieve the pain and keep the swelling down. Frozen peas or corn in a ZipLock bag can be frozen, used and re-frozen. Fifteen minutes on and 15 minutes off is a reasonable schedule.  ° °Hygiene: ° °You may shower 48 hours after your surgery. Tub bathing should be restricted until the seventh day. ° °Medication: ° °You will be sent home with some type of pain medication. In many cases you will be  sent home with a narcotic pain pill (Vicodin or Tylox). If the pain is not too bad, you may take either Tylenol (acetaminophen) or Advil (ibuprofen) which contain no narcotic agents, and might be tolerated a little better, with fewer side effects. If the pain medication you are sent home with does not control the pain, you will have to let us know. Some narcotic pain medications cannot be given or refilled by a phone call to a pharmacy. ° °Problems you should report to us: ° °· Fever of 101.0 degrees Fahrenheit or greater. °· Moderate or severe swelling under the skin incision or involving the scrotum. °· Drug reaction such as hives, a rash, nausea or vomiting.  ° ° °

## 2023-12-11 NOTE — Anesthesia Postprocedure Evaluation (Signed)
 Anesthesia Post Note  Patient: Dennis Zhang  Procedure(s) Performed: CIRCUMCISION, ADULT BIOPSY, PENIS     Patient location during evaluation: PACU Anesthesia Type: General Level of consciousness: awake and alert Pain management: pain level controlled Vital Signs Assessment: post-procedure vital signs reviewed and stable Respiratory status: spontaneous breathing, nonlabored ventilation, respiratory function stable and patient connected to nasal cannula oxygen Cardiovascular status: blood pressure returned to baseline and stable Postop Assessment: no apparent nausea or vomiting Anesthetic complications: no   No notable events documented.  Last Vitals:  Vitals:   12/11/23 1730 12/11/23 1745  BP: (!) 155/91 (!) 168/86  Pulse: 84 68  Resp: 16 15  Temp:  36.6 C  SpO2: 100% 100%    Last Pain:  Vitals:   12/11/23 1655  TempSrc:   PainSc: 10-Worst pain ever                 [name]

## 2023-12-11 NOTE — Anesthesia Procedure Notes (Signed)
 Procedure Name: Intubation Date/Time: 12/11/2023 4:07 PM  Performed by: Judythe Tanda Aran, CRNAPre-anesthesia Checklist: Patient identified, Emergency Drugs available, Suction available and Patient being monitored Patient Re-evaluated:Patient Re-evaluated prior to induction Oxygen Delivery Method: Circle system utilized Preoxygenation: Pre-oxygenation with 100% oxygen Induction Type: IV induction, Rapid sequence and Cricoid Pressure applied Laryngoscope Size: 4 and Mac Grade View: Grade I Tube type: Oral Tube size: 7.5 mm Number of attempts: 1 Airway Equipment and Method: Stylet Placement Confirmation: ETT inserted through vocal cords under direct vision, positive ETCO2 and breath sounds checked- equal and bilateral Secured at: 22 cm Tube secured with: Tape Dental Injury: Teeth and Oropharynx as per pre-operative assessment

## 2023-12-11 NOTE — Op Note (Signed)
 Operative Note  Preoperative diagnosis:  1.  Phimosis 2.  Penile mass  Postoperative diagnosis: 1.  Phimosis 2.  Penile Mass  Procedure(s): 1.  Circumcision 2.  Excision of penile mass  Surgeon: Valli Shank, MD  Assistants:  None  Anesthesia:  General  Complications:  None  EBL:  minimal  Specimens: 1. Foreskin and penile mass  Drains/Catheters: 1.  none  Intraoperative findings:   3 small fungating growths  Indication:  Dennis Zhang is a 68 y.o. male with growing penile mass on foreskin. Unable to retract foreskin due to phimosis.   Description of procedure:  After risks and benefits of the procedure discussed with the patient, informed consent was obtained.  The patient was taken to the operating room placed in supine position.  Anesthesia was induced and antibiotics were administered.  He was prepped and draped in the usual sterile fashion and was performed.  A penile and ring block were performed with 1% lidocaine  mixed with quarter percent Marcaine  plain.  A dorsal slit was performed due to the inability to retract foreskin.  There were 3 small fungating masses seen on the foreskin.  Once the foreskin could be retracted, a marking pen was used to mark out a circumferential line approximate 1 cm proximal to the corona.  A scalpel was used to sharply incise along this line.  The foreskin was then returned to its normal position and another circumferential line was made along the level of the corona with a marking pen.  A scalpel was again used to sharply incise along this line.  A straight clamp was used to clamp the midline of the foreskin.  This was then divided.  The excess foreskin was then removed with Bovie electrocautery taking great care not to come close to the glans.  The foreskin was collected for specimen. Hemostasis was obtained with Bovie electrocautery.  The skin was then reapproximated with interrupted 3-0 chromic sutures.  A dressing was placed with  Xeroform gauze, Kerlix and Coban.  The patient emerged from anesthesia and was turned to the back in stable condition.  Plan:  Follow up in the office for pathology. Remove dressing in 2 days.

## 2023-12-11 NOTE — Progress Notes (Signed)
 Patient had a circumcision today, vitals are stable and there are no complications. However patient's family is in [place]  and he lives in an apartment alone. Stoltzfus MD was notified and MD stated he can not go home due to anesthesia protocol requiring that someone is to be with the patient for 24 hours after anesthesia. Pace MD was notified and patient will be admitted for overnight observation and discharged tomorrow.

## 2023-12-11 NOTE — Transfer of Care (Signed)
 Immediate Anesthesia Transfer of Care Note  Patient: Federico Lasky  Procedure(s) Performed: CIRCUMCISION, ADULT BIOPSY, PENIS  Patient Location: PACU  Anesthesia Type:General  Level of Consciousness: awake and patient cooperative  Airway & Oxygen Therapy: Patient Spontanous Breathing and Patient connected to face mask  Post-op Assessment: Report given to RN and Post -op Vital signs reviewed and stable  Post vital signs: Reviewed and stable  Last Vitals:  Vitals Value Taken Time  BP 152/88 12/11/23 16:30  Temp    Pulse    Resp 20 12/11/23 16:31  SpO2    Vitals shown include unfiled device data.  Last Pain:  Vitals:   12/11/23 1334  TempSrc:   PainSc: 5       Patients Stated Pain Goal: 4 (12/11/23 1334)  Complications: No notable events documented.

## 2023-12-12 ENCOUNTER — Encounter (HOSPITAL_COMMUNITY): Payer: Self-pay | Admitting: Urology

## 2023-12-12 DIAGNOSIS — N471 Phimosis: Secondary | ICD-10-CM | POA: Diagnosis not present

## 2023-12-12 LAB — HIV ANTIBODY (ROUTINE TESTING W REFLEX): HIV Screen 4th Generation wRfx: NONREACTIVE

## 2023-12-12 LAB — GLUCOSE, CAPILLARY: Glucose-Capillary: 70 mg/dL (ref 70–99)

## 2023-12-12 NOTE — Discharge Summary (Signed)
 Date of admission: 12/11/2023  Date of discharge: 12/12/2023  Admission diagnosis: phimosis and penile mass  Discharge diagnosis: phimosis and penile mass  Secondary diagnoses:  Patient Active Problem List   Diagnosis Date Noted   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   No post-op complications 12/11/2023   Spontaneous pneumothorax 07/25/2017   Alcohol withdrawal syndrome with complication (HCC)    Elevated lactic acid level    Seizure (HCC)    Leukopenia 05/23/2016   Lactic acidosis 05/23/2016   Syncope and collapse 05/22/2016   Syncope 06/15/2014   Hypotension 06/15/2014   Fall    Arterial hypotension    Weakness    Dehydration 06/14/2014   Protein-calorie malnutrition, severe (HCC) 05/21/2014   Acute kidney injury (HCC) 05/20/2014   Orthostatic hypotension 05/20/2014   Alcohol abuse 05/20/2014   Transaminitis 05/20/2014   Thrombocytopenia (HCC) 05/20/2014   Acute renal failure (HCC) 03/03/2014   Alcohol intoxication (HCC) 03/03/2014   Increased anion gap metabolic acidosis 03/03/2014   Malnutrition of moderate degree (HCC) 03/03/2014   Hypokalemia 10/06/2012   Gram-negative bacteremia 10/06/2012   Leukocytosis 10/03/2012   Chronic pancreatitis (HCC) 10/03/2012   AKI (acute kidney injury) (HCC) 10/03/2012   Alcoholic hepatitis 10/03/2012   Hypoglycemia secondary to sulfonylurea 10/03/2012    Procedures performed: Procedure(s): CIRCUMCISION, ADULT BIOPSY, PENIS  History and Physical: For full details, please see admission history and physical. Briefly, Dennis Zhang is a 68 y.o. year old patient with phimosis and incompletely evaluated penile mass.   Hospital Course: Patient tolerated the procedure well.  He was then transferred to the floor after an uneventful PACU stay.  His hospital course was uncomplicated.  On POD#1 he had met discharge criteria: was eating a regular diet, was up and ambulating independently,  pain was well controlled, was voiding without a  catheter, and was ready to for discharge.  He was admitted for observation because he did not have a ride home or someone to stay with him after surgery.  PE Gen-NAD, alert and oriented Resp-nonlabored respirations GU-adequate meatus, normal appearing glans, penile dressing c/d/i   Laboratory values:  Recent Labs    12/10/23 1506 12/11/23 1332  WBC 6.2  --   HGB 11.8* 12.2*  HCT 35.3* 36.0*   Recent Labs    12/10/23 1506 12/11/23 1332  NA 136 138  K 2.9* 2.9*  CL 94* 91*  CO2 23  --   GLUCOSE 188* 176*  BUN 14 16  CREATININE 1.79* 2.10*  CALCIUM 8.0*  --    No results for input(s): LABPT, INR in the last 72 hours. No results for input(s): LABURIN in the last 72 hours. Results for orders placed or performed during the hospital encounter of 07/24/17  Culture, blood (routine x 2)     Status: None   Collection Time: 07/24/17  8:35 PM   Specimen: BLOOD  Result Value Ref Range Status   Specimen Description   Final    BLOOD LEFT ANTECUBITAL Performed at [facility], [address], [place], [place] [address]    Special Requests   Final    IN PEDIATRIC BOTTLE Blood Culture adequate volume Performed at [facility], [address], [place], [place] [address]    Culture   Final    NO GROWTH 5 DAYS Performed at [facility], [address] [place], [place] [address]    Report Status 07/29/2017 FINAL  Final  Culture, blood (routine x 2)     Status:  None   Collection Time: 07/24/17 10:18 PM   Specimen: BLOOD  Result Value Ref Range Status   Specimen Description   Final    BLOOD RIGHT ANTECUBITAL Performed at [facility], [address], [place], [place] [address]    Special Requests   Final    IN PEDIATRIC BOTTLE Blood Culture adequate volume Performed at [facility], [address], [place], [place] [address]    Culture   Final    NO GROWTH 5 DAYS Performed at [facility], [address] [place], [place] [address]    Report Status 07/29/2017 FINAL  Final    Disposition: Home  Discharge instruction: The patient was instructed to be ambulatory but told to refrain from heavy lifting, strenuous activity, or driving.   Discharge medications:  Allergies as of 12/12/2023       Reactions   Ace Inhibitors    Kidney injury 05/2014-do not Rx   Aspirin  Nausea Only, Other (See Comments)   Reaction to Bayer aspirin  - causes acid reflux and nausea   Metformin Other (See Comments)   Other reaction(s): Abdominal pain   Methocarbamol Nausea And Vomiting   Tylenol  [acetaminophen ] Nausea And Vomiting   States can take Tylenol  if has other pain med w/it - like Hydrocodone         Medication List     TAKE these medications    acetaminophen  500 MG tablet Commonly known as: TYLENOL  TAKE TWO TABLETS BY MOUTH TWICE A DAY AS NEEDED FOR PAIN   amLODipine  5 MG tablet Commonly known as: NORVASC  Take 1 tablet (5 mg total) by mouth daily.   Carboxymethylcellulose Sodium 0.25 % Soln INSTILL 1 DROP IN EACH EYE 2-4 TIMES A DAY   CLEAR EYES OP Place 1 drop into both eyes daily.   feeding supplement (ENSURE COMPLETE) Liqd Take 237 mLs by mouth 3 (three) times daily between meals.   fluocinonide cream 0.05 % Commonly known as: LIDEX APPLY SMALL AMOUNT TO AFFECTED AREA TWICE A DAY   folic acid  1 MG tablet Commonly known as: FOLVITE  Take 1 tablet (1 mg total) by mouth daily.   gabapentin  300 MG capsule Commonly known as: Neurontin  Take 1 capsule (300 mg total) by mouth 3 (three) times daily. What changed:  when to take this reasons to take this   levETIRAcetam  500 MG tablet Commonly known as: Keppra  Take 1 tablet (500 mg total) by mouth 2 (two) times daily. What changed: when to take this   lipase/protease/amylase 36000 UNITS Cpep capsule Commonly known as: CREON Take by mouth.   lisinopril  2.5 MG tablet Commonly known as: ZESTRIL  Take by  mouth.   Magnesium  Oxide 420 MG Tabs Take 420 mg by mouth daily.   meloxicam 15 MG tablet Commonly known as: MOBIC Take 15 mg by mouth daily.   oxyCODONE  5 MG immediate release tablet Commonly known as: Roxicodone  Take 1 tablet (5 mg total) by mouth every 6 (six) hours as needed for severe pain (pain score 7-10).   pantoprazole  40 MG tablet Commonly known as: PROTONIX  Take 1 tablet (40 mg total) by mouth daily.   sildenafil 100 MG tablet Commonly known as: VIAGRA Take 50 mg by mouth daily as needed for erectile dysfunction.   tamsulosin  0.4 MG Caps capsule Commonly known as: Flomax  Take 1 capsule (0.4 mg total) by mouth daily after breakfast.   terbinafine 1 % cream Commonly known as: LAMISIL APPLY MODERATE AMOUNT TO AFFECTED AREA TWICE A  DAY   thiamine  100 MG tablet Commonly known as: VITAMIN B1 Take 1 tablet (100 mg total) by mouth daily.   traMADol  50 MG tablet Commonly known as: Ultram  Take 1 tablet (50 mg total) by mouth every 6 (six) hours as needed.   urea 20 % cream Commonly known as: CARMOL APPLY MODERATE AMOUNT TO AFFECTED AREA TWICE A DAY               Discharge Care Instructions  (From admission, onward)           Start     Ordered   12/12/23 0000  Discharge wound care:       Comments: Remove dressing tomorrow 7/24. You can bathe like usual. Vaseline can be used over stitches for comfort if needed.   12/12/23 0750            Followup:   Follow-up Information     [facility] Follow up.   Contact information: [address] [place]  [address] [phone]

## 2023-12-12 NOTE — Plan of Care (Signed)

## 2023-12-14 LAB — SURGICAL PATHOLOGY

## 2023-12-17 ENCOUNTER — Encounter (HOSPITAL_COMMUNITY): Payer: Self-pay | Admitting: Urology

## 2024-01-27 ENCOUNTER — Emergency Department (HOSPITAL_COMMUNITY)

## 2024-01-27 ENCOUNTER — Encounter (HOSPITAL_COMMUNITY): Payer: Self-pay

## 2024-01-27 ENCOUNTER — Other Ambulatory Visit: Payer: Self-pay

## 2024-01-27 ENCOUNTER — Inpatient Hospital Stay (HOSPITAL_COMMUNITY)
Admission: EM | Admit: 2024-01-27 | Discharge: 2024-02-01 | DRG: 064 | Disposition: A | Discharge status: Skilled Nursing Facility | Attending: Internal Medicine | Admitting: Internal Medicine

## 2024-01-27 DIAGNOSIS — R197 Diarrhea, unspecified: Secondary | ICD-10-CM

## 2024-01-27 DIAGNOSIS — F329 Major depressive disorder, single episode, unspecified: Secondary | ICD-10-CM | POA: Diagnosis present

## 2024-01-27 DIAGNOSIS — R569 Unspecified convulsions: Secondary | ICD-10-CM | POA: Diagnosis present

## 2024-01-27 DIAGNOSIS — E1165 Type 2 diabetes mellitus with hyperglycemia: Secondary | ICD-10-CM | POA: Diagnosis present

## 2024-01-27 DIAGNOSIS — Z7902 Long term (current) use of antithrombotics/antiplatelets: Secondary | ICD-10-CM

## 2024-01-27 DIAGNOSIS — E440 Moderate protein-calorie malnutrition: Secondary | ICD-10-CM | POA: Diagnosis present

## 2024-01-27 DIAGNOSIS — G894 Chronic pain syndrome: Secondary | ICD-10-CM | POA: Diagnosis present

## 2024-01-27 DIAGNOSIS — I129 Hypertensive chronic kidney disease with stage 1 through stage 4 chronic kidney disease, or unspecified chronic kidney disease: Secondary | ICD-10-CM | POA: Diagnosis present

## 2024-01-27 DIAGNOSIS — E876 Hypokalemia: Principal | ICD-10-CM | POA: Diagnosis present

## 2024-01-27 DIAGNOSIS — I493 Ventricular premature depolarization: Secondary | ICD-10-CM | POA: Diagnosis present

## 2024-01-27 DIAGNOSIS — Z79899 Other long term (current) drug therapy: Secondary | ICD-10-CM

## 2024-01-27 DIAGNOSIS — Z741 Need for assistance with personal care: Secondary | ICD-10-CM | POA: Diagnosis present

## 2024-01-27 DIAGNOSIS — E1140 Type 2 diabetes mellitus with diabetic neuropathy, unspecified: Secondary | ICD-10-CM | POA: Diagnosis present

## 2024-01-27 DIAGNOSIS — E1122 Type 2 diabetes mellitus with diabetic chronic kidney disease: Secondary | ICD-10-CM | POA: Diagnosis present

## 2024-01-27 DIAGNOSIS — R531 Weakness: Secondary | ICD-10-CM

## 2024-01-27 DIAGNOSIS — Z886 Allergy status to analgesic agent status: Secondary | ICD-10-CM

## 2024-01-27 DIAGNOSIS — Z9049 Acquired absence of other specified parts of digestive tract: Secondary | ICD-10-CM

## 2024-01-27 DIAGNOSIS — E11649 Type 2 diabetes mellitus with hypoglycemia without coma: Secondary | ICD-10-CM | POA: Diagnosis not present

## 2024-01-27 DIAGNOSIS — R29810 Facial weakness: Secondary | ICD-10-CM | POA: Diagnosis present

## 2024-01-27 DIAGNOSIS — Z888 Allergy status to other drugs, medicaments and biological substances status: Secondary | ICD-10-CM

## 2024-01-27 DIAGNOSIS — I4891 Unspecified atrial fibrillation: Secondary | ICD-10-CM | POA: Diagnosis present

## 2024-01-27 DIAGNOSIS — R270 Ataxia, unspecified: Secondary | ICD-10-CM | POA: Diagnosis present

## 2024-01-27 DIAGNOSIS — Z7982 Long term (current) use of aspirin: Secondary | ICD-10-CM

## 2024-01-27 DIAGNOSIS — N1831 Chronic kidney disease, stage 3a: Secondary | ICD-10-CM | POA: Diagnosis present

## 2024-01-27 DIAGNOSIS — E43 Unspecified severe protein-calorie malnutrition: Secondary | ICD-10-CM | POA: Diagnosis present

## 2024-01-27 DIAGNOSIS — I63421 Cerebral infarction due to embolism of right anterior cerebral artery: Principal | ICD-10-CM | POA: Diagnosis present

## 2024-01-27 DIAGNOSIS — I358 Other nonrheumatic aortic valve disorders: Secondary | ICD-10-CM | POA: Diagnosis present

## 2024-01-27 DIAGNOSIS — K529 Noninfective gastroenteritis and colitis, unspecified: Secondary | ICD-10-CM | POA: Diagnosis present

## 2024-01-27 DIAGNOSIS — F17210 Nicotine dependence, cigarettes, uncomplicated: Secondary | ICD-10-CM | POA: Diagnosis present

## 2024-01-27 DIAGNOSIS — R29702 NIHSS score 2: Secondary | ICD-10-CM | POA: Diagnosis present

## 2024-01-27 DIAGNOSIS — M1990 Unspecified osteoarthritis, unspecified site: Secondary | ICD-10-CM | POA: Diagnosis present

## 2024-01-27 DIAGNOSIS — E785 Hyperlipidemia, unspecified: Secondary | ICD-10-CM | POA: Diagnosis present

## 2024-01-27 DIAGNOSIS — Z8249 Family history of ischemic heart disease and other diseases of the circulatory system: Secondary | ICD-10-CM

## 2024-01-27 DIAGNOSIS — Z9181 History of falling: Secondary | ICD-10-CM

## 2024-01-27 DIAGNOSIS — I639 Cerebral infarction, unspecified: Secondary | ICD-10-CM | POA: Diagnosis present

## 2024-01-27 DIAGNOSIS — Z791 Long term (current) use of non-steroidal anti-inflammatories (NSAID): Secondary | ICD-10-CM

## 2024-01-27 DIAGNOSIS — N179 Acute kidney failure, unspecified: Secondary | ICD-10-CM | POA: Diagnosis present

## 2024-01-27 DIAGNOSIS — R471 Dysarthria and anarthria: Secondary | ICD-10-CM | POA: Diagnosis present

## 2024-01-27 DIAGNOSIS — N400 Enlarged prostate without lower urinary tract symptoms: Secondary | ICD-10-CM | POA: Diagnosis present

## 2024-01-27 DIAGNOSIS — E860 Dehydration: Secondary | ICD-10-CM | POA: Diagnosis not present

## 2024-01-27 DIAGNOSIS — R54 Age-related physical debility: Secondary | ICD-10-CM | POA: Diagnosis present

## 2024-01-27 DIAGNOSIS — E872 Acidosis, unspecified: Secondary | ICD-10-CM | POA: Diagnosis present

## 2024-01-27 LAB — DIFFERENTIAL
Abs Immature Granulocytes: 0.05 K/uL (ref 0.00–0.07)
Basophils Absolute: 0 K/uL (ref 0.0–0.1)
Basophils Relative: 0 %
Eosinophils Absolute: 0 K/uL (ref 0.0–0.5)
Eosinophils Relative: 0 %
Immature Granulocytes: 1 %
Lymphocytes Relative: 20 %
Lymphs Abs: 1.9 K/uL (ref 0.7–4.0)
Monocytes Absolute: 0.6 K/uL (ref 0.1–1.0)
Monocytes Relative: 6 %
Neutro Abs: 7.2 K/uL (ref 1.7–7.7)
Neutrophils Relative %: 73 %

## 2024-01-27 LAB — CBC
HCT: 45.3 % (ref 39.0–52.0)
Hemoglobin: 15.2 g/dL (ref 13.0–17.0)
MCH: 34.2 pg — ABNORMAL HIGH (ref 26.0–34.0)
MCHC: 33.6 g/dL (ref 30.0–36.0)
MCV: 101.8 fL — ABNORMAL HIGH (ref 80.0–100.0)
Platelets: 193 K/uL (ref 150–400)
RBC: 4.45 MIL/uL (ref 4.22–5.81)
RDW: 15.4 % (ref 11.5–15.5)
WBC: 9.8 K/uL (ref 4.0–10.5)
nRBC: 0 % (ref 0.0–0.2)

## 2024-01-27 LAB — APTT: aPTT: 30 s (ref 24–36)

## 2024-01-27 LAB — COMPREHENSIVE METABOLIC PANEL WITH GFR
ALT: 22 U/L (ref 0–44)
AST: 57 U/L — ABNORMAL HIGH (ref 15–41)
Albumin: 2.7 g/dL — ABNORMAL LOW (ref 3.5–5.0)
Alkaline Phosphatase: 196 U/L — ABNORMAL HIGH (ref 38–126)
Anion gap: 15 (ref 5–15)
BUN: 12 mg/dL (ref 8–23)
CO2: 24 mmol/L (ref 22–32)
Calcium: 9.2 mg/dL (ref 8.9–10.3)
Chloride: 101 mmol/L (ref 98–111)
Creatinine, Ser: 2.11 mg/dL — ABNORMAL HIGH (ref 0.61–1.24)
GFR, Estimated: 33 mL/min — ABNORMAL LOW (ref >60)
Glucose, Bld: 149 mg/dL — ABNORMAL HIGH (ref 70–99)
Potassium: 2.2 mmol/L — CL (ref 3.5–5.1)
Sodium: 140 mmol/L (ref 135–145)
Total Bilirubin: 1 mg/dL (ref 0.0–1.2)
Total Protein: 8.1 g/dL (ref 6.5–8.1)

## 2024-01-27 LAB — I-STAT CHEM 8, ED
BUN: 18 mg/dL (ref 8–23)
Calcium, Ion: 1.18 mmol/L (ref 1.15–1.40)
Chloride: 103 mmol/L (ref 98–111)
Creatinine, Ser: 2.1 mg/dL — ABNORMAL HIGH (ref 0.61–1.24)
Glucose, Bld: 153 mg/dL — ABNORMAL HIGH (ref 70–99)
HCT: 50 % (ref 39.0–52.0)
Hemoglobin: 17 g/dL (ref 13.0–17.0)
Potassium: 2.4 mmol/L — CL (ref 3.5–5.1)
Sodium: 144 mmol/L (ref 135–145)
TCO2: 28 mmol/L (ref 22–32)

## 2024-01-27 LAB — CBG MONITORING, ED: Glucose-Capillary: 112 mg/dL — ABNORMAL HIGH (ref 70–99)

## 2024-01-27 LAB — ETHANOL: Alcohol, Ethyl (B): <15 mg/dL (ref <15)

## 2024-01-27 MED ORDER — LACTATED RINGERS IV BOLUS
1000.0000 mL | Freq: Once | INTRAVENOUS | Status: AC
  Administered 2024-01-27: 1000 mL via INTRAVENOUS

## 2024-01-27 MED ORDER — POTASSIUM CHLORIDE 10 MEQ/100ML IV SOLN
10.0000 meq | INTRAVENOUS | Status: AC
  Administered 2024-01-27 (×3): 10 meq via INTRAVENOUS
  Filled 2024-01-27 (×3): qty 100

## 2024-01-27 MED ORDER — POTASSIUM CHLORIDE CRYS ER 20 MEQ PO TBCR
40.0000 meq | EXTENDED_RELEASE_TABLET | Freq: Once | ORAL | Status: AC
  Administered 2024-01-27: 10 meq via ORAL
  Filled 2024-01-27: qty 2

## 2024-01-27 MED ORDER — OXYCODONE HCL 5 MG PO TABS
5.0000 mg | ORAL_TABLET | Freq: Once | ORAL | Status: AC
  Administered 2024-01-27: 5 mg via ORAL
  Filled 2024-01-27: qty 1

## 2024-01-27 MED ORDER — SODIUM CHLORIDE 0.9% FLUSH
3.0000 mL | Freq: Once | INTRAVENOUS | Status: AC
  Administered 2024-01-27: 3 mL via INTRAVENOUS

## 2024-01-27 NOTE — ED Provider Notes (Signed)
  Provider Note MRN:  [id number]  Arrival date & time: 01/28/24    ED Course and Medical Decision Making  Assumed care of patient at sign-out or upon transfer.  Patient presenting with facial droop on the left side, some difficulty with words.  Also felt to be severely hypokalemic, plan is for admission, MRI.  Neurology is consulted and agrees with MRI, available for consultation if MRI of results show acute process.  Accepted for admission by hospitalist service.  Patient resting comfortably on my assessment, having some left hip pain that he attributes to a remote injury.  Providing pain control which will help facilitate MRI.  .Critical Care  Performed by: Theadore Ozell HERO, MD Authorized by: Theadore Ozell HERO, MD   Critical care provider statement:    Critical care time (minutes):  35   Critical care was necessary to treat or prevent imminent or life-threatening deterioration of the following conditions:  Metabolic crisis   Critical care was time spent personally by me on the following activities:  Development of treatment plan with patient or surrogate, discussions with consultants, evaluation of patient's response to treatment, examination of patient, ordering and review of laboratory studies, ordering and review of radiographic studies, ordering and performing treatments and interventions, pulse oximetry, re-evaluation of patient's condition and review of old charts   I assumed direction of critical care for this patient from another provider in my specialty: yes     Care discussed with: admitting provider     Final Clinical Impressions(s) / ED Diagnoses     ICD-10-CM   1. Hypokalemia  E87.6     2. Weakness  R53.1     3. Diarrhea, unspecified type  R19.7       ED Discharge Orders     None       Discharge Instructions   None     Ozell HERO. Theadore, MD [facility] Emergency [facility] [email or web address]    Theadore Ozell HERO, MD 01/28/24 FONTAINE

## 2024-01-27 NOTE — ED Notes (Addendum)
 SABRA

## 2024-01-27 NOTE — ED Notes (Signed)
 Patient transported to CT

## 2024-01-27 NOTE — ED Notes (Signed)
 Notified Dr. Randol of hypotension - LR bolus ordered

## 2024-01-27 NOTE — ED Notes (Signed)
 Went to start pt's IV K+ and LAC is infiltrated. IV restart to L wrist, flowing freely no complications

## 2024-01-27 NOTE — ED Triage Notes (Signed)
 Pt BIB [facility] from home d/t stroke like symptoms. Pt started having R facial droop and slurring words started yesterday at 2pm. Pt is A& O X4. Per mes, the pt was going in and out of afib.

## 2024-01-27 NOTE — ED Notes (Signed)
 Called CT to see if pt could go ahead and get scanned

## 2024-01-27 NOTE — ED Provider Notes (Addendum)
 [place] [facility] AT [facility] Provider Note   CSN: [id number] Arrival date & time: 01/27/24  1748     Patient presents with: stroke like symptoms    Dennis Zhang is a 68 y.o. male.   HPI   Patient has history of arthritis hypertension chronic pain diabetes hyperlipidemia seizures hepatitis depression.  Patient states he noticed yesterday he started having some drooping on the left side of his face.  He also began having some difficulty slurring his words.  He was also having some weakness more on the left side.  Patient states he fell as a result of him feeling weak.Pt also has been having trouble with diarrhea.  Prior to Admission medications   Medication Sig Start Date End Date Taking? Authorizing Provider  acetaminophen  (TYLENOL ) 500 MG tablet TAKE TWO TABLETS BY MOUTH TWICE A DAY AS NEEDED FOR PAIN 04/21/20   [provider]  amLODipine  (NORVASC ) 5 MG tablet Take 1 tablet (5 mg total) by mouth daily. 07/29/17   Silva Zapata, Edwin, MD  Carboxymethylcellulose Sodium 0.25 % SOLN INSTILL 1 DROP IN EACH EYE 2-4 TIMES A DAY 10/13/19   [provider]  feeding supplement, ENSURE COMPLETE, (ENSURE COMPLETE) LIQD Take 237 mLs by mouth 3 (three) times daily between meals. 06/16/14   Samtani, Jai-Gurmukh, MD  fluocinonide cream (LIDEX) 0.05 % APPLY SMALL AMOUNT TO AFFECTED AREA TWICE A DAY 06/28/20   [provider]  folic acid  (FOLVITE ) 1 MG tablet Take 1 tablet (1 mg total) by mouth daily. 05/24/16   Jerri Keys, MD  gabapentin  (NEURONTIN ) 300 MG capsule Take 1 capsule (300 mg total) by mouth 3 (three) times daily. Patient taking differently: Take 300 mg by mouth 4 (four) times daily as needed (for nerve pain.). 07/31/12   Schinlever, Dorothyann, PA-C  levETIRAcetam  (KEPPRA ) 500 MG tablet Take 1 tablet (500 mg total) by mouth 2 (two) times daily. Patient taking differently: Take 500 mg by mouth daily. 06/13/16   Sofia, Leslie K, PA-C   lipase/protease/amylase (CREON) 36000 UNITS CPEP capsule Take by mouth. 08/22/22   [provider]  lisinopril  (ZESTRIL ) 2.5 MG tablet Take by mouth. 01/30/23   [provider]  Magnesium  Oxide 420 MG TABS Take 420 mg by mouth daily.     [provider]  meloxicam (MOBIC) 15 MG tablet Take 15 mg by mouth daily.    [provider]  Naphazoline HCl (CLEAR EYES OP) Place 1 drop into both eyes daily.    [provider]  oxyCODONE  (ROXICODONE ) 5 MG immediate release tablet Take 1 tablet (5 mg total) by mouth every 6 (six) hours as needed for severe pain (pain score 7-10). 03/14/23   Towana Ozell BROCKS, MD  pantoprazole  (PROTONIX ) 40 MG tablet Take 1 tablet (40 mg total) by mouth daily. 05/24/16   Jerri Keys, MD  sildenafil (VIAGRA) 100 MG tablet Take 50 mg by mouth daily as needed for erectile dysfunction.     [provider]  tamsulosin  (FLOMAX ) 0.4 MG CAPS capsule Take 1 capsule (0.4 mg total) by mouth daily after breakfast. 05/23/16   Jerri Keys, MD  terbinafine (LAMISIL) 1 % cream APPLY MODERATE AMOUNT TO AFFECTED AREA TWICE A DAY 06/28/20   [provider]  thiamine  100 MG tablet Take 1 tablet (100 mg total) by mouth daily. 05/24/16   Jerri Keys, MD  traMADol  (ULTRAM ) 50 MG tablet Take 1 tablet (50 mg total) by mouth every 6 (six) hours as needed. 12/11/23 12/10/24  Pace, Maryellen D, MD  urea (CARMOL) 20 % cream APPLY MODERATE AMOUNT TO AFFECTED AREA TWICE A DAY 06/28/20   [provider]    Allergies: Ace inhibitors, Aspirin , Metformin, Methocarbamol, and Tylenol  [acetaminophen ]    Review of Systems  Updated Vital Signs BP (!) 168/117   Pulse 95   Temp 97.8 F (36.6 C) (Temporal)   Resp 19   SpO2 100%   Physical Exam Vitals and nursing note reviewed.  Constitutional:      General: He is not in acute distress.    Appearance: He is well-developed.  HENT:     Head: Normocephalic and atraumatic.     Right Ear: External ear normal.      Left Ear: External ear normal.  Eyes:     General: No visual field deficit or scleral icterus.       Right eye: No discharge.        Left eye: No discharge.     Conjunctiva/sclera: Conjunctivae normal.  Neck:     Trachea: No tracheal deviation.  Cardiovascular:     Rate and Rhythm: Normal rate and regular rhythm.  Pulmonary:     Effort: Pulmonary effort is normal. No respiratory distress.     Breath sounds: Normal breath sounds. No stridor. No wheezing or rales.  Abdominal:     General: Bowel sounds are normal. There is no distension.     Palpations: Abdomen is soft.     Tenderness: There is no abdominal tenderness. There is no guarding or rebound.  Musculoskeletal:        General: No tenderness or deformity.     Cervical back: Neck supple.  Skin:    General: Skin is warm and dry.     Findings: No rash.  Neurological:     Mental Status: He is alert and oriented to person, place, and time.     Cranial Nerves: Facial asymmetry present. No cranial nerve deficit or dysarthria.     Sensory: No sensory deficit.     Motor: No abnormal muscle tone, seizure activity or pronator drift.     Coordination: Coordination normal.     Comments:  able to hold both legs off bed for 5 seconds, sensation intact in all extremities,  no left or right sided neglect, normal finger-nose exam bilaterally, no nystagmus noted  Left sided facial droop   Psychiatric:        Mood and Affect: Mood normal.     (all labs ordered are listed, but only abnormal results are displayed) Labs Reviewed  CBC - Abnormal; Notable for the following components:      Result Value   MCV 101.8 (*)    MCH 34.2 (*)    All other components within normal limits  COMPREHENSIVE METABOLIC PANEL WITH GFR - Abnormal; Notable for the following components:   Potassium 2.2 (*)    Glucose, Bld 149 (*)    Creatinine, Ser 2.11 (*)    Albumin 2.7 (*)    AST 57 (*)    Alkaline Phosphatase 196 (*)    GFR, Estimated 33 (*)    All  other components within normal limits  I-STAT CHEM 8, ED - Abnormal; Notable for the following components:   Potassium 2.4 (*)    Creatinine, Ser 2.10 (*)    Glucose, Bld 153 (*)    All other components within normal limits  CBG MONITORING, ED - Abnormal; Notable for the following components:   Glucose-Capillary 112 (*)  All other components within normal limits  CULTURE, BLOOD (ROUTINE X 2)  CULTURE, BLOOD (ROUTINE X 2)  APTT  DIFFERENTIAL  ETHANOL  MAGNESIUM   I-STAT CG4 LACTIC ACID, ED  I-STAT CG4 LACTIC ACID, ED    EKG: EKG Interpretation Date/Time:  Sunday January 27 2024 18:20:47 EDT Ventricular Rate:  94 PR Interval:  142 QRS Duration:  92 QT Interval:  458 QTC Calculation: 573 R Axis:   266  Text Interpretation: Sinus rhythm Ventricular premature complex Inferior infarct, old Anteroseptal infarct, old Lateral leads are also involved Prolonged QT interval No significant change since last tracing Confirmed by Randol Simmonds [phone]) on 01/27/2024 10:50:27 PM  Radiology: CT Cervical Spine Wo Contrast Result Date: 01/27/2024 CLINICAL DATA:  Neck trauma EXAM: CT CERVICAL SPINE WITHOUT CONTRAST TECHNIQUE: Multidetector CT imaging of the cervical spine was performed without intravenous contrast. Multiplanar CT image reconstructions were also generated. RADIATION DOSE REDUCTION: This exam was performed according to the departmental dose-optimization program which includes automated exposure control, adjustment of the mA and/or kV according to patient size and/or use of iterative reconstruction technique. COMPARISON:  08/03/2023 FINDINGS: Alignment: Normal Skull base and vertebrae: No acute fracture. No primary bone lesion or focal pathologic process. Soft tissues and spinal canal: No prevertebral fluid or swelling. No visible canal hematoma. Disc levels: Advanced multi level degenerative disc and facet disease. Upper chest: No acute findings.  Emphysema. Other: None IMPRESSION: Advanced  multilevel degenerative changes. No acute bony abnormality. Emphysema. Electronically Signed   By: Franky Crease M.D.   On: 01/27/2024 22:17   CT HEAD WO CONTRAST Result Date: 01/27/2024 CLINICAL DATA:  Possible TIA EXAM: CT HEAD WITHOUT CONTRAST TECHNIQUE: Contiguous axial images were obtained from the base of the skull through the vertex without intravenous contrast. RADIATION DOSE REDUCTION: This exam was performed according to the departmental dose-optimization program which includes automated exposure control, adjustment of the mA and/or kV according to patient size and/or use of iterative reconstruction technique. COMPARISON:  08/03/2023 FINDINGS: Brain: There is atrophy and chronic small vessel disease changes. No acute intracranial abnormality. Specifically, no hemorrhage, hydrocephalus, mass lesion, acute infarction, or significant intracranial injury. Vascular: No hyperdense vessel or unexpected calcification. Skull: No acute calvarial abnormality. Sinuses/Orbits: No acute findings Other: None IMPRESSION: Atrophy, chronic microvascular disease. No acute intracranial abnormality. Electronically Signed   By: Franky Crease M.D.   On: 01/27/2024 22:14     .Critical Care  Performed by: Randol Simmonds, MD Authorized by: Randol Simmonds, MD   Critical care provider statement:    Critical care time (minutes):  30   Critical care was time spent personally by me on the following activities:  Development of treatment plan with patient or surrogate, discussions with consultants, evaluation of patient's response to treatment, examination of patient, ordering and review of laboratory studies, ordering and review of radiographic studies, ordering and performing treatments and interventions, pulse oximetry, re-evaluation of patient's condition and review of old charts    Medications Ordered in the ED  sodium chloride  flush (NS) 0.9 % injection 3 mL (3 mLs Intravenous Given 01/27/24 1911)  potassium chloride  10 mEq in  100 mL IVPB (10 mEq Intravenous New Bag/Given 01/27/24 2212)  potassium chloride  SA (KLOR-CON  M) CR tablet 40 mEq (10 mEq Oral Given 01/27/24 1943)  oxyCODONE  (Oxy IR/ROXICODONE ) immediate release tablet 5 mg (5 mg Oral Given 01/27/24 2102)  lactated ringers  bolus 1,000 mL (1,000 mLs Intravenous New Bag/Given 01/27/24 2102)    Clinical Course as of 01/27/24 2355  [place] Jan 27, 2024  1912 I-stat chem 8, ED(!!) Potassium decreased to 2.4. [JK]  2053 Comprehensive metabolic panel(!!) Creatinine is elevated but similar to previous.  CBC without signs of anemia [JK]  2059 Notified blood pressure is decreased.  Will start fluid bolus, check lactic acid leve [JK]  2312 Head CT and C-spine CT without acute changes. [JK]  2321 Patient's blood pressure has improved. [JK]  2347 Case discussed with Dr Maree regarding admission [JK]  2355 Case discussed with Dr Voncile [JK]    Clinical Course User Index [JK] Randol Simmonds, MD                                 Medical Decision Making Differential diagnosis includes but not limited to stroke, electrolyte imbalance, dehydration, infection  Amount and/or Complexity of Data Reviewed Labs: ordered. Decision-making details documented in ED Course. Radiology: ordered.  Risk Prescription drug management. Decision regarding hospitalization.   Patient presented with complaints of increasing weakness concerns for possible stroke.  Patient also has been having issues with diarrhea recently.    Labs were notable for significant hypokalemia with potassium is decreased to 2.2.  Creatinine also slightly increased compared to previous values.  No signs of anemia or leukocytosis.  While in the ED patient did have an episode of hypotension.  I have added on lactic acid levels and blood cultures.  Patient was treated with IV fluids and now his blood pressure is hypertensive.  Patient's head CT and C-spine CT do not show any signs of acute stroke or serious injury.  It is  possible patient had an occult stroke that is not showing up on CT.  It is also possible that his weakness is related to the hypokalemia.  IV and oral potassium placement has been ordered.  I will consult the medical service for admission and further treatment.     Final diagnoses:  Hypokalemia  Weakness  Diarrhea, unspecified type    ED Discharge Orders     None          Randol Simmonds, MD 01/27/24 7652    Randol Simmonds, MD 01/27/24 2355

## 2024-01-27 NOTE — ED Notes (Signed)
 Unable to obtain blood cultures or istat...KM

## 2024-01-28 ENCOUNTER — Emergency Department (HOSPITAL_COMMUNITY)

## 2024-01-28 ENCOUNTER — Observation Stay (HOSPITAL_COMMUNITY)

## 2024-01-28 ENCOUNTER — Observation Stay (HOSPITAL_BASED_OUTPATIENT_CLINIC_OR_DEPARTMENT_OTHER)

## 2024-01-28 DIAGNOSIS — F17210 Nicotine dependence, cigarettes, uncomplicated: Secondary | ICD-10-CM | POA: Diagnosis present

## 2024-01-28 DIAGNOSIS — N1831 Chronic kidney disease, stage 3a: Secondary | ICD-10-CM | POA: Diagnosis present

## 2024-01-28 DIAGNOSIS — I639 Cerebral infarction, unspecified: Secondary | ICD-10-CM | POA: Diagnosis present

## 2024-01-28 DIAGNOSIS — I63421 Cerebral infarction due to embolism of right anterior cerebral artery: Secondary | ICD-10-CM | POA: Diagnosis present

## 2024-01-28 DIAGNOSIS — E785 Hyperlipidemia, unspecified: Secondary | ICD-10-CM | POA: Diagnosis present

## 2024-01-28 DIAGNOSIS — I358 Other nonrheumatic aortic valve disorders: Secondary | ICD-10-CM | POA: Diagnosis present

## 2024-01-28 DIAGNOSIS — I6350 Cerebral infarction due to unspecified occlusion or stenosis of unspecified cerebral artery: Secondary | ICD-10-CM

## 2024-01-28 DIAGNOSIS — N179 Acute kidney failure, unspecified: Secondary | ICD-10-CM | POA: Diagnosis present

## 2024-01-28 DIAGNOSIS — N400 Enlarged prostate without lower urinary tract symptoms: Secondary | ICD-10-CM | POA: Diagnosis present

## 2024-01-28 DIAGNOSIS — E1122 Type 2 diabetes mellitus with diabetic chronic kidney disease: Secondary | ICD-10-CM | POA: Diagnosis present

## 2024-01-28 DIAGNOSIS — Z79899 Other long term (current) drug therapy: Secondary | ICD-10-CM | POA: Diagnosis not present

## 2024-01-28 DIAGNOSIS — E860 Dehydration: Secondary | ICD-10-CM | POA: Diagnosis not present

## 2024-01-28 DIAGNOSIS — E440 Moderate protein-calorie malnutrition: Secondary | ICD-10-CM | POA: Diagnosis not present

## 2024-01-28 DIAGNOSIS — E43 Unspecified severe protein-calorie malnutrition: Secondary | ICD-10-CM | POA: Diagnosis present

## 2024-01-28 DIAGNOSIS — R29702 NIHSS score 2: Secondary | ICD-10-CM

## 2024-01-28 DIAGNOSIS — F329 Major depressive disorder, single episode, unspecified: Secondary | ICD-10-CM | POA: Diagnosis present

## 2024-01-28 DIAGNOSIS — E1140 Type 2 diabetes mellitus with diabetic neuropathy, unspecified: Secondary | ICD-10-CM | POA: Diagnosis present

## 2024-01-28 DIAGNOSIS — R197 Diarrhea, unspecified: Secondary | ICD-10-CM | POA: Diagnosis not present

## 2024-01-28 DIAGNOSIS — R569 Unspecified convulsions: Secondary | ICD-10-CM | POA: Diagnosis present

## 2024-01-28 DIAGNOSIS — I6389 Other cerebral infarction: Secondary | ICD-10-CM | POA: Diagnosis not present

## 2024-01-28 DIAGNOSIS — E876 Hypokalemia: Secondary | ICD-10-CM | POA: Diagnosis present

## 2024-01-28 DIAGNOSIS — E872 Acidosis, unspecified: Secondary | ICD-10-CM | POA: Diagnosis present

## 2024-01-28 DIAGNOSIS — Z8249 Family history of ischemic heart disease and other diseases of the circulatory system: Secondary | ICD-10-CM | POA: Diagnosis not present

## 2024-01-28 DIAGNOSIS — E11649 Type 2 diabetes mellitus with hypoglycemia without coma: Secondary | ICD-10-CM | POA: Diagnosis not present

## 2024-01-28 DIAGNOSIS — I129 Hypertensive chronic kidney disease with stage 1 through stage 4 chronic kidney disease, or unspecified chronic kidney disease: Secondary | ICD-10-CM | POA: Diagnosis present

## 2024-01-28 DIAGNOSIS — I4891 Unspecified atrial fibrillation: Secondary | ICD-10-CM | POA: Diagnosis present

## 2024-01-28 DIAGNOSIS — I739 Peripheral vascular disease, unspecified: Secondary | ICD-10-CM | POA: Diagnosis not present

## 2024-01-28 DIAGNOSIS — N189 Chronic kidney disease, unspecified: Secondary | ICD-10-CM | POA: Diagnosis not present

## 2024-01-28 DIAGNOSIS — E1165 Type 2 diabetes mellitus with hyperglycemia: Secondary | ICD-10-CM | POA: Diagnosis present

## 2024-01-28 DIAGNOSIS — F1910 Other psychoactive substance abuse, uncomplicated: Secondary | ICD-10-CM | POA: Diagnosis not present

## 2024-01-28 DIAGNOSIS — G894 Chronic pain syndrome: Secondary | ICD-10-CM | POA: Diagnosis present

## 2024-01-28 LAB — BASIC METABOLIC PANEL WITH GFR
Anion gap: 14 (ref 5–15)
Anion gap: 13 (ref 5–15)
Anion gap: 11 (ref 5–15)
BUN: 13 mg/dL (ref 8–23)
BUN: 13 mg/dL (ref 8–23)
BUN: 13 mg/dL (ref 8–23)
CO2: 25 mmol/L (ref 22–32)
CO2: 22 mmol/L (ref 22–32)
CO2: 24 mmol/L (ref 22–32)
Calcium: 8.7 mg/dL — ABNORMAL LOW (ref 8.9–10.3)
Calcium: 8.3 mg/dL — ABNORMAL LOW (ref 8.9–10.3)
Calcium: 8.3 mg/dL — ABNORMAL LOW (ref 8.9–10.3)
Chloride: 104 mmol/L (ref 98–111)
Chloride: 104 mmol/L (ref 98–111)
Chloride: 105 mmol/L (ref 98–111)
Creatinine, Ser: 2.07 mg/dL — ABNORMAL HIGH (ref 0.61–1.24)
Creatinine, Ser: 1.93 mg/dL — ABNORMAL HIGH (ref 0.61–1.24)
Creatinine, Ser: 1.82 mg/dL — ABNORMAL HIGH (ref 0.61–1.24)
GFR, Estimated: 34 mL/min — ABNORMAL LOW (ref >60)
GFR, Estimated: 37 mL/min — ABNORMAL LOW (ref >60)
GFR, Estimated: 40 mL/min — ABNORMAL LOW (ref >60)
Glucose, Bld: 111 mg/dL — ABNORMAL HIGH (ref 70–99)
Glucose, Bld: 255 mg/dL — ABNORMAL HIGH (ref 70–99)
Glucose, Bld: 143 mg/dL — ABNORMAL HIGH (ref 70–99)
Potassium: 2.3 mmol/L — CL (ref 3.5–5.1)
Potassium: 2.5 mmol/L — CL (ref 3.5–5.1)
Potassium: 2.4 mmol/L — CL (ref 3.5–5.1)
Sodium: 143 mmol/L (ref 135–145)
Sodium: 139 mmol/L (ref 135–145)
Sodium: 140 mmol/L (ref 135–145)

## 2024-01-28 LAB — HEMOGLOBIN A1C
Hgb A1c MFr Bld: 5.1 % (ref 4.8–5.6)
Mean Plasma Glucose: 99.67 mg/dL

## 2024-01-28 LAB — CBC
HCT: 38.4 % — ABNORMAL LOW (ref 39.0–52.0)
Hemoglobin: 13.2 g/dL (ref 13.0–17.0)
MCH: 34.7 pg — ABNORMAL HIGH (ref 26.0–34.0)
MCHC: 34.4 g/dL (ref 30.0–36.0)
MCV: 101.1 fL — ABNORMAL HIGH (ref 80.0–100.0)
Platelets: 180 K/uL (ref 150–400)
RBC: 3.8 MIL/uL — ABNORMAL LOW (ref 4.22–5.81)
RDW: 15.2 % (ref 11.5–15.5)
WBC: 14 K/uL — ABNORMAL HIGH (ref 4.0–10.5)
nRBC: 0 % (ref 0.0–0.2)

## 2024-01-28 LAB — LIPID PANEL
Cholesterol: 93 mg/dL (ref 0–200)
HDL: 54 mg/dL (ref >40)
LDL Cholesterol: 30 mg/dL (ref 0–99)
Total CHOL/HDL Ratio: 1.7 ratio
Triglycerides: 44 mg/dL (ref <150)
VLDL: 9 mg/dL (ref 0–40)

## 2024-01-28 LAB — GLUCOSE, CAPILLARY
Glucose-Capillary: 62 mg/dL — ABNORMAL LOW (ref 70–99)
Glucose-Capillary: 63 mg/dL — ABNORMAL LOW (ref 70–99)
Glucose-Capillary: 319 mg/dL — ABNORMAL HIGH (ref 70–99)
Glucose-Capillary: 87 mg/dL (ref 70–99)

## 2024-01-28 LAB — ECHOCARDIOGRAM COMPLETE
Area-P 1/2: 4.24 cm2
S' Lateral: 1.74 cm

## 2024-01-28 LAB — CBG MONITORING, ED: Glucose-Capillary: 103 mg/dL — ABNORMAL HIGH (ref 70–99)

## 2024-01-28 LAB — MAGNESIUM: Magnesium: 1.3 mg/dL — ABNORMAL LOW (ref 1.7–2.4)

## 2024-01-28 LAB — LACTIC ACID, PLASMA: Lactic Acid, Venous: 1.5 mmol/L (ref 0.5–1.9)

## 2024-01-28 MED ORDER — DEXTROSE 50 % IV SOLN
1.0000 | Freq: Once | INTRAVENOUS | Status: AC
Start: 2024-01-28 — End: 2024-01-28
  Administered 2024-01-28: 50 mL via INTRAVENOUS
  Filled 2024-01-28: qty 50

## 2024-01-28 MED ORDER — POTASSIUM CHLORIDE 10 MEQ/100ML IV SOLN
10.0000 meq | INTRAVENOUS | Status: AC
  Administered 2024-01-28 (×6): 10 meq via INTRAVENOUS
  Filled 2024-01-28 (×6): qty 100

## 2024-01-28 MED ORDER — ONDANSETRON HCL 4 MG/2ML IJ SOLN: 4.0000 mg | Freq: Four times a day (QID) | INTRAMUSCULAR | Status: DC | PRN

## 2024-01-28 MED ORDER — POTASSIUM CHLORIDE 10 MEQ/100ML IV SOLN
10.0000 meq | INTRAVENOUS | Status: AC
  Administered 2024-01-28 (×4): 10 meq via INTRAVENOUS
  Filled 2024-01-28 (×4): qty 100

## 2024-01-28 MED ORDER — STROKE: EARLY STAGES OF RECOVERY BOOK
Freq: Once | Status: AC
  Filled 2024-01-28: qty 1

## 2024-01-28 MED ORDER — ATORVASTATIN CALCIUM 10 MG PO TABS
10.0000 mg | ORAL_TABLET | Freq: Every day | ORAL | Status: DC
  Administered 2024-01-28 – 2024-02-01 (×5): 10 mg via ORAL
  Filled 2024-01-28 (×5): qty 1

## 2024-01-28 MED ORDER — ENSURE PLUS HIGH PROTEIN PO LIQD
237.0000 mL | Freq: Three times a day (TID) | ORAL | Status: DC
  Administered 2024-01-28 – 2024-02-01 (×12): 237 mL via ORAL

## 2024-01-28 MED ORDER — ASPIRIN 81 MG PO TBEC
81.0000 mg | DELAYED_RELEASE_TABLET | Freq: Every day | ORAL | Status: DC
Start: 2024-01-29 — End: 2024-02-18
  Administered 2024-01-29 – 2024-02-01 (×4): 81 mg via ORAL
  Filled 2024-01-28 (×4): qty 1

## 2024-01-28 MED ORDER — DULOXETINE HCL 30 MG PO CPEP
30.0000 mg | ORAL_CAPSULE | Freq: Every day | ORAL | Status: DC
  Administered 2024-01-28 – 2024-02-01 (×5): 30 mg via ORAL
  Filled 2024-01-28 (×5): qty 1

## 2024-01-28 MED ORDER — TAMSULOSIN HCL 0.4 MG PO CAPS
0.4000 mg | ORAL_CAPSULE | Freq: Every day | ORAL | Status: DC
Start: 2024-01-28 — End: 2024-02-01
  Administered 2024-01-28 – 2024-02-01 (×5): 0.4 mg via ORAL
  Filled 2024-01-28 (×5): qty 1

## 2024-01-28 MED ORDER — ONDANSETRON HCL 4 MG PO TABS
4.0000 mg | ORAL_TABLET | Freq: Four times a day (QID) | ORAL | Status: DC | PRN
Start: 2024-01-28 — End: 2024-02-01

## 2024-01-28 MED ORDER — LATANOPROST 0.005 % OP SOLN
1.0000 [drp] | Freq: Every day | OPHTHALMIC | Status: DC
  Administered 2024-01-28 – 2024-01-31 (×4): 1 [drp] via OPHTHALMIC
  Filled 2024-01-28: qty 2.5

## 2024-01-28 MED ORDER — INSULIN ASPART 100 UNIT/ML IJ SOLN
0.0000 [IU] | Freq: Three times a day (TID) | INTRAMUSCULAR | Status: DC
  Administered 2024-01-28: 7 [IU] via SUBCUTANEOUS

## 2024-01-28 MED ORDER — HYDROMORPHONE HCL 1 MG/ML IJ SOLN
1.0000 mg | Freq: Once | INTRAMUSCULAR | Status: AC
  Administered 2024-01-28: 1 mg via INTRAVENOUS
  Filled 2024-01-28: qty 1

## 2024-01-28 MED ORDER — GABAPENTIN 300 MG PO CAPS
300.0000 mg | ORAL_CAPSULE | Freq: Four times a day (QID) | ORAL | Status: DC | PRN
  Administered 2024-01-28 – 2024-02-01 (×6): 300 mg via ORAL
  Filled 2024-01-28 (×6): qty 1

## 2024-01-28 MED ORDER — OXYCODONE HCL 5 MG PO TABS
5.0000 mg | ORAL_TABLET | Freq: Four times a day (QID) | ORAL | Status: DC | PRN
  Administered 2024-01-28 – 2024-02-01 (×13): 5 mg via ORAL
  Filled 2024-01-28 (×13): qty 1

## 2024-01-28 MED ORDER — ASPIRIN 81 MG PO TBEC
81.0000 mg | DELAYED_RELEASE_TABLET | Freq: Every day | ORAL | Status: DC
  Administered 2024-01-28: 81 mg via ORAL
  Filled 2024-01-28: qty 1

## 2024-01-28 MED ORDER — FOLIC ACID 1 MG PO TABS
1.0000 mg | ORAL_TABLET | Freq: Every day | ORAL | Status: DC
  Administered 2024-01-28 – 2024-02-01 (×5): 1 mg via ORAL
  Filled 2024-01-28 (×5): qty 1

## 2024-01-28 MED ORDER — CLOPIDOGREL BISULFATE 75 MG PO TABS
75.0000 mg | ORAL_TABLET | Freq: Every day | ORAL | Status: DC
  Administered 2024-01-28 – 2024-02-01 (×5): 75 mg via ORAL
  Filled 2024-01-28 (×5): qty 1

## 2024-01-28 MED ORDER — THIAMINE MONONITRATE 100 MG PO TABS
100.0000 mg | ORAL_TABLET | Freq: Every day | ORAL | Status: DC
  Administered 2024-01-28 – 2024-02-01 (×5): 100 mg via ORAL
  Filled 2024-01-28 (×7): qty 1

## 2024-01-28 MED ORDER — ADULT MULTIVITAMIN W/MINERALS CH
1.0000 | ORAL_TABLET | Freq: Every day | ORAL | Status: DC
  Administered 2024-01-28 – 2024-02-01 (×5): 1 via ORAL
  Filled 2024-01-28 (×5): qty 1

## 2024-01-28 MED ORDER — MAGNESIUM SULFATE 2 GM/50ML IV SOLN
2.0000 g | Freq: Once | INTRAVENOUS | Status: AC
  Administered 2024-01-28: 2 g via INTRAVENOUS
  Filled 2024-01-28: qty 50

## 2024-01-28 MED ORDER — POTASSIUM CHLORIDE 10 MEQ/100ML IV SOLN: 10.0000 meq | INTRAVENOUS | Status: DC

## 2024-01-28 MED ORDER — MAGNESIUM OXIDE -MG SUPPLEMENT 400 (240 MG) MG PO TABS
400.0000 mg | ORAL_TABLET | Freq: Every day | ORAL | Status: DC
Start: 2024-01-28 — End: 2024-01-31
  Administered 2024-01-28 – 2024-01-31 (×4): 400 mg via ORAL
  Filled 2024-01-28 (×4): qty 1

## 2024-01-28 MED ORDER — ALBUTEROL SULFATE (2.5 MG/3ML) 0.083% IN NEBU: 3.0000 mL | INHALATION_SOLUTION | Freq: Four times a day (QID) | RESPIRATORY_TRACT | Status: DC | PRN

## 2024-01-28 MED ORDER — POTASSIUM CHLORIDE CRYS ER 20 MEQ PO TBCR
40.0000 meq | EXTENDED_RELEASE_TABLET | Freq: Once | ORAL | Status: AC
  Administered 2024-01-28: 40 meq via ORAL
  Filled 2024-01-28: qty 2

## 2024-01-28 MED ORDER — HEPARIN SODIUM (PORCINE) 5000 UNIT/ML IJ SOLN
5000.0000 [IU] | Freq: Three times a day (TID) | INTRAMUSCULAR | Status: DC
  Administered 2024-01-28 – 2024-02-01 (×14): 5000 [IU] via SUBCUTANEOUS
  Filled 2024-01-28 (×13): qty 1

## 2024-01-28 MED ORDER — POLYVINYL ALCOHOL 1.4 % OP SOLN: 1.0000 [drp] | OPHTHALMIC | Status: DC | PRN

## 2024-01-28 MED ORDER — INSULIN ASPART 100 UNIT/ML IJ SOLN: 0.0000 [IU] | Freq: Every day | INTRAMUSCULAR | Status: DC

## 2024-01-28 NOTE — Consult Note (Addendum)
 NEUROLOGY CONSULT NOTE   Date of service: January 28, 2024 Patient Name: Aarish Previti MRN:  [id number] DOB:  May 15, 1956 Chief Complaint: Generalized weakness, facial drooping and slurred speech Requesting Provider: Maree Adron BIRCH, DO  History of Present Illness  Miles Leyda is a 68 y.o. male with hx of hypertension, hyperlipidemia, diabetes, depression, chronic pain, history of possible alcohol  withdrawal seizures seizures, CKD presented to the hospital for concern for strokelike symptoms.  He reported he been having facial droop on his left and had slurred speech that started somewhere around 2 PM on 01/26/2024.  EMS crew who brought the patient reported patient was in and out of atrial fibrillation. Patient was evaluated further in the ER with labs revealing lactic acidosis, hypokalemia, AKI on top of the known CKD.  MRI of the brain was performed that revealed small acute right pericallosal frontal lobe infarct and a punctate acute or subacute infarct in the right occipital lobe. Neurology was consulted for further management  LKW: Possibly 2 PM 01/26/2024 although patient is not a very reliable historian Modified rankin score: 1-No significant post stroke disability and can perform usual duties with stroke symptoms IV Thrombolysis: Outside the window EVT: Exam not consistent with LVO  NIHSS components Score: Comment  1a Level of Conscious 0[x]  1[]  2[]  3[]      1b LOC Questions 0[x]  1[]  2[]       1c LOC Commands 0[x]  1[]  2[]       2 Best Gaze 0[x]  1[]  2[]       3 Visual 0[x]  1[]  2[]  3[]      4 Facial Palsy 0[]  1[x]  2[]  3[]      5a Motor Arm - left 0[x]  1[]  2[]  3[]  4[]  UN[]    5b Motor Arm - Right 0[x]  1[]  2[]  3[]  4[]  UN[]    6a Motor Leg - Left 0[x]  1[]  2[]  3[]  4[]  UN[]    6b Motor Leg - Right 0[x]  1[]  2[]  3[]  4[]  UN[]    7 Limb Ataxia 0[x]  1[]  2[]  UN[]      8 Sensory 0[x]  1[]  2[]  UN[]      9 Best Language 0[x]  1[]  2[]  3[]      10 Dysarthria 0[]  1[x]  2[]  UN[]      11 Extinct. and  Inattention 0[x]  1[]  2[]       TOTAL: 2      ROS  Comprehensive ROS performed and pertinent positives documented in HPI   Past History   Past Medical History:  Diagnosis Date   Arthritis    Chronic pain    Depression    Diabetes mellitus without complication (HCC)    borderline   Hepatitis    Hyperlipidemia    Hypertension    Seizures (HCC)     Past Surgical History:  Procedure Laterality Date   ABDOMINAL SURGERY     APPENDECTOMY     CIRCUMCISION N/A 12/11/2023   Procedure: CIRCUMCISION, ADULT;  Surgeon: Elisabeth Valli BIRCH, MD;  Location: [facility];  Service: Urology;  Laterality: N/A;  CIRCUMCISION, EXCISIONAL PENILE BIOPSY   CYST EXCISION     HEMORROIDECTOMY     PENILE BIOPSY N/A 12/11/2023   Procedure: BIOPSY, PENIS;  Surgeon: Elisabeth Valli BIRCH, MD;  Location: [facility];  Service: Urology;  Laterality: N/A;    Family History: Family History  Problem Relation Age of Onset   Hypertension Mother    Migraines Sister    Heart failure Brother    Migraines Brother     Social History  reports that he has been smoking cigarettes. He has a 15  pack-year smoking history. He has never used smokeless tobacco. He reports current alcohol  use. He reports that he does not use drugs.  Allergies  Allergen Reactions   Ace Inhibitors     Kidney injury 05/2014-do not Rx   Aspirin  Nausea Only and Other (See Comments)    Reaction to Bayer aspirin  - causes acid reflux and nausea   Metformin Other (See Comments)    Other reaction(s): Abdominal pain   Methocarbamol Nausea And Vomiting   Tylenol  [Acetaminophen ] Nausea And Vomiting    States can take Tylenol  if has other pain med w/it - like Hydrocodone      Medications  No current facility-administered medications for this encounter.  Current Outpatient Medications:    acetaminophen  (TYLENOL ) 500 MG tablet, TAKE TWO TABLETS BY MOUTH TWICE A DAY AS NEEDED FOR PAIN, Disp: , Rfl:    amLODipine  (NORVASC ) 5 MG tablet, Take 1 tablet (5 mg total)  by mouth daily., Disp: 30 tablet, Rfl: 0   Carboxymethylcellulose Sodium 0.25 % SOLN, INSTILL 1 DROP IN EACH EYE 2-4 TIMES A DAY, Disp: , Rfl:    feeding supplement, ENSURE COMPLETE, (ENSURE COMPLETE) LIQD, Take 237 mLs by mouth 3 (three) times daily between meals., Disp: , Rfl:    fluocinonide cream (LIDEX) 0.05 %, APPLY SMALL AMOUNT TO AFFECTED AREA TWICE A DAY, Disp: , Rfl:    folic acid  (FOLVITE ) 1 MG tablet, Take 1 tablet (1 mg total) by mouth daily., Disp: 30 tablet, Rfl: 0   gabapentin  (NEURONTIN ) 300 MG capsule, Take 1 capsule (300 mg total) by mouth 3 (three) times daily. (Patient taking differently: Take 300 mg by mouth 4 (four) times daily as needed (for nerve pain.).), Disp: 45 capsule, Rfl: 0   levETIRAcetam  (KEPPRA ) 500 MG tablet, Take 1 tablet (500 mg total) by mouth 2 (two) times daily. (Patient taking differently: Take 500 mg by mouth daily.), Disp: 20 tablet, Rfl: 0   lipase/protease/amylase (CREON) 36000 UNITS CPEP capsule, Take by mouth., Disp: , Rfl:    lisinopril  (ZESTRIL ) 2.5 MG tablet, Take by mouth., Disp: , Rfl:    Magnesium  Oxide 420 MG TABS, Take 420 mg by mouth daily. , Disp: , Rfl:    meloxicam (MOBIC) 15 MG tablet, Take 15 mg by mouth daily., Disp: , Rfl:    Naphazoline HCl (CLEAR EYES OP), Place 1 drop into both eyes daily., Disp: , Rfl:    oxyCODONE  (ROXICODONE ) 5 MG immediate release tablet, Take 1 tablet (5 mg total) by mouth every 6 (six) hours as needed for severe pain (pain score 7-10)., Disp: 10 tablet, Rfl: 0   pantoprazole  (PROTONIX ) 40 MG tablet, Take 1 tablet (40 mg total) by mouth daily., Disp: 30 tablet, Rfl: 0   sildenafil (VIAGRA) 100 MG tablet, Take 50 mg by mouth daily as needed for erectile dysfunction. , Disp: , Rfl:    tamsulosin  (FLOMAX ) 0.4 MG CAPS capsule, Take 1 capsule (0.4 mg total) by mouth daily after breakfast., Disp: 30 capsule, Rfl: 0   terbinafine (LAMISIL) 1 % cream, APPLY MODERATE AMOUNT TO AFFECTED AREA TWICE A DAY, Disp: , Rfl:     thiamine  100 MG tablet, Take 1 tablet (100 mg total) by mouth daily., Disp: 30 tablet, Rfl: 0   traMADol  (ULTRAM ) 50 MG tablet, Take 1 tablet (50 mg total) by mouth every 6 (six) hours as needed., Disp: 20 tablet, Rfl: 0   urea (CARMOL) 20 % cream, APPLY MODERATE AMOUNT TO AFFECTED AREA TWICE A DAY, Disp: , Rfl:  Vitals   Vitals:   01/28/24 0115 01/28/24 0130 01/28/24 0145 01/28/24 0200  BP: (!) 149/88 (!) 143/87 (!) 148/91 (!) 153/90  Pulse:  92  94  Resp: 20 20 19 20   Temp:      TempSrc:      SpO2:  99%  99%    There is no height or weight on file to calculate BMI.   Physical Exam   Constitutional: Appears well-developed and well-nourished.  Psych: Affect appropriate to situation.  Eyes: No scleral injection.  HENT: No OP obstruction. Head: Normocephalic. Cardiovascular: Normal rate and regular rhythm.  Respiratory: Effort normal, non-labored breathing.  GI: Soft.  No distension. There is no tenderness.  Skin: Extremely dry and cracked  Neurologic Examination  Awake alert oriented x 3 Mild dysarthria No aphasia Cranial nerves: Pupils equal round react light, extraocular movements intact, visual fields full, face shows mild asymmetry with subtle flattening of the left nasolabial fold although on smiling the face appears symmetric, tongue and palate midline. Motor examination with no drift in any of the 4 extremities. Sensation intact to light touch Coordination reveals no gross dysmetria  Labs/Imaging/Neurodiagnostic studies   CBC:  Recent Labs  Lab 02/24/24 1809 Feb 24, 2024 1838  WBC 9.8  --   NEUTROABS 7.2  --   HGB 15.2 17.0  HCT 45.3 50.0  MCV 101.8*  --   PLT 193  --    Basic Metabolic Panel:  Lab Results  Component Value Date   NA 144 2024-02-24   K 2.4 (LL) 2024-02-24   CO2 24 Feb 24, 2024   GLUCOSE 153 (H) 2024/02/24   BUN 18 02/24/24   CREATININE 2.10 (H) 02/24/2024   CALCIUM  9.2 2024-02-24   GFRNONAA 33 (L) 02/24/2024   GFRAA 55 (L) 09/11/2017    Lipid Panel: No results found for: LDLCALC HgbA1c:  Lab Results  Component Value Date   HGBA1C 5.2 10/03/2012   Urine Drug Screen:     Component Value Date/Time   LABOPIA POSITIVE (A) 09/11/2017 2050   COCAINSCRNUR NONE DETECTED 09/11/2017 2050   LABBENZ NONE DETECTED 09/11/2017 2050   AMPHETMU NONE DETECTED 09/11/2017 2050   THCU NONE DETECTED 09/11/2017 2050   LABBARB NONE DETECTED 09/11/2017 2050    Alcohol  Level     Component Value Date/Time   ETH <15 2024/02/24 1809   INR  Lab Results  Component Value Date   INR 1.2 03/14/2023   APTT  Lab Results  Component Value Date   APTT 30 02/24/24   AED levels:  Lab Results  Component Value Date   PHENYTOIN  <2.5 (L) 06/14/2014    Imaging personally reviewed CT head with atrophy, chronic microvascular disease MR brain without contrast showing small acute right pericallosal frontal lobe infarct.  Punctate acute to subacute right occipital lobe infarct.  ASSESSMENT   Hisham Cowans is a 68 y.o. male with medical history as above who presents for evaluation of slurred speech, generalized weakness and left facial droop. Noted to have lactic acidosis and hypokalemia on labs.  Imaging reveals small acute right pericallosal frontal lobe infarct and a punctate acute to subacute right occipital lobe infarct. According to the EMS staff that brought the patient to the ED in the ambulance, he was in and out of atrial fibrillation Etiology of these strokes might be embolic.  Other etiologies such as concomitant small vessel disease also needs risk factor workup.  Impression: Acute ischemic stroke  RECOMMENDATIONS  Admit to hospitalist Frequent rechecks Telemetry If atrial fibrillation is  found, ultimately will need anticoagulation.  Till then keep the patient on aspirin  81+ Plavix  75.  Noted allergy to aspirin .  Hopefully will only need to use it for shorter term. Check LDL-goal less than 70.  High intensity statin if LDL  greater than 70 CTA head and neck would be ideal but he has deranged renal function.  MRA head without contrast and carotid Dopplers should be done. 2D echo A1c Lipid panel Therapy assessments Correction of hypokalemia, AKI per primary team Management of lactic acidosis and evaluation for possible underlying infection per primary team.  Stroke team to follow Plan discussed with Dr. Theadore and relayed to admitting hospitalist Dr. Maree ______________________________________________________________________    Signed, Eligio Lav, MD [facility]

## 2024-01-28 NOTE — Evaluation (Signed)
 Occupational Therapy Evaluation Patient Details Name: Dennis Zhang MRN: [id number] DOB: Apr 16, 1956 Today's Date: 01/28/2024   History of Present Illness   Pt is 68 yo presenting [facility] on 9/7 due to L sided facial droop and slurred speech. Mri with findings of R frontal and occipital acute ischemic CVA. PMH: chrnoic pain, depression, DM II, dyslipidemia, HTN, CKD.     Clinical Impressions Aaden was evaluated s/p the above admission list. He lives alone in an apartment with supportive neighbors, he is mod I for ADLs and mobility with a rollator at baseline. Pt states he has had a few falls recently and ADLs have become difficult. Upon evaluation the pt was limited by generalized weakness, poor activity tolerance, unsteady gait, balance and limited insight . Overall he needed min A +2 for most aspects of mobility with a RW, he fatigued quickly and got shaky and impulsive with hallway mobility. Due to the deficits listed below the pt also needed total A for peri care, max A for LB ADLs and min A for UB ADLs with cues for initiation and sequencing. Pt will benefit from continued acute OT services and intensive inpatient follow up therapy, >3 hours/day after discharge with hopes he can progress to a mod I level.        Functional Status Assessment   Patient has had a recent decline in their functional status and demonstrates the ability to make significant improvements in function in a reasonable and predictable amount of time.     Equipment Recommendations   Other (comment) (RW)     Recommendations for Other Services   Rehab consult     Precautions/Restrictions   Precautions Precautions: Fall Recall of Precautions/Restrictions: Impaired Restrictions Weight Bearing Restrictions Per Provider Order: No     Mobility Bed Mobility Overal bed mobility: Needs Assistance Bed Mobility: Supine to Sit, Sit to Supine     Supine to sit: Min assist Sit to supine: Mod assist         Transfers Overall transfer level: Needs assistance Equipment used: Rolling walker (2 wheels) Transfers: Sit to/from Stand Sit to Stand: Min assist                  Balance Overall balance assessment: Needs assistance Sitting-balance support: Feet supported Sitting balance-Leahy Scale: Fair     Standing balance support: Bilateral upper extremity supported, During functional activity Standing balance-Leahy Scale: Poor                             ADL either performed or assessed with clinical judgement   ADL Overall ADL's : Needs assistance/impaired Eating/Feeding: Set up;Sitting   Grooming: Set up;Sitting   Upper Body Bathing: Minimal assistance;Sitting   Lower Body Bathing: Maximal assistance;Sit to/from stand   Upper Body Dressing : Minimal assistance;Sitting   Lower Body Dressing: Maximal assistance;Sit to/from stand   Toilet Transfer: Minimal assistance;Ambulation;Rolling walker (2 wheels)   Toileting- Clothing Manipulation and Hygiene: Total assistance;Sit to/from stand Toileting - Clothing Manipulation Details (indicate cue type and reason): total A fro peri care in standing on eval     Functional mobility during ADLs: Minimal assistance;Rolling walker (2 wheels) General ADL Comments: cues needed for safety. pt limited by fatigue and activity tolerance     Vision Baseline Vision/History: 1 Wears glasses Vision Assessment?: No apparent visual deficits     Perception Perception: Not tested       Praxis Praxis: Not tested  Pertinent Vitals/Pain Pain Assessment Pain Assessment: Faces Faces Pain Scale: Hurts little more Pain Location: hips, back, neck Pain Descriptors / Indicators: Discomfort Pain Intervention(s): Limited activity within patient's tolerance, Monitored during session, Patient requesting pain meds-RN notified     Extremity/Trunk Assessment Upper Extremity Assessment Upper Extremity Assessment: Generalized  weakness;RUE deficits/detail;LUE deficits/detail RUE Deficits / Details: generally weak, functional with use of RW and bed mobility. Needs further testing of coordination and motor planning LUE Deficits / Details: generally weak, functional with use of RW and bed mobility. Needs further testing of coordination and motor planning   Lower Extremity Assessment Lower Extremity Assessment: Defer to PT evaluation   Cervical / Trunk Assessment Cervical / Trunk Assessment: Kyphotic   Communication Communication Communication: No apparent difficulties   Cognition Arousal: Alert Behavior During Therapy: Flat affect Cognition: No family/caregiver present to determine baseline             OT - Cognition Comments: overall WFL for basic tasks, followed simple commands. limited insight and problem solving noted                 Following commands: Impaired Following commands impaired: Only follows one step commands consistently, Follows multi-step commands inconsistently     Cueing  General Comments   Cueing Techniques: Verbal cues  VSS on RA   Exercises     Shoulder Instructions      Home Living Family/patient expects to be discharged to:: Private residence Living Arrangements: Alone   Type of Home: Apartment Home Access: Level entry     Home Layout: One level     Bathroom Shower/Tub: Chief Strategy Officer: Handicapped height     Home Equipment: Cane - single point;Shower seat;Grab bars - tub/shower;Grab bars - toilet;Wheelchair - manual;Rollator (4 wheels)   Additional Comments: Pt is supposed to get an aide on sept. 12th (he states he/she can come 7 days/wk)      Prior Functioning/Environment Prior Level of Function : History of Falls (last six months)             Mobility Comments: walks with a RW vs. SPC ADLs Comments: mod I for ADLs, manages his medications, does not drive    OT Problem List: Decreased strength;Decreased range of  motion;Decreased activity tolerance;Impaired balance (sitting and/or standing);Decreased safety awareness;Decreased knowledge of use of DME or AE;Decreased knowledge of precautions   OT Treatment/Interventions: Self-care/ADL training;Neuromuscular education;Therapeutic exercise;Therapeutic activities;Patient/family education;Balance training      OT Goals(Current goals can be found in the care plan section)   Acute Rehab OT Goals Patient Stated Goal: to get better OT Goal Formulation: With patient Time For Goal Achievement: 02/11/24 Potential to Achieve Goals: Good ADL Goals Pt Will Perform Grooming: with modified independence Pt Will Perform Upper Body Dressing: with modified independence Pt Will Perform Lower Body Dressing: with contact guard assist;sit to/from stand Pt Will Transfer to Toilet: with modified independence;ambulating   OT Frequency:  Min 2X/week    Co-evaluation PT/OT/SLP Co-Evaluation/Treatment: Yes Reason for Co-Treatment: Complexity of the patient's impairments (multi-system involvement);To address functional/ADL transfers;For patient/therapist safety   OT goals addressed during session: ADL's and self-care      AM-PAC OT 6 Clicks Daily Activity     Outcome Measure Help from another person eating meals?: A Little Help from another person taking care of personal grooming?: A Little Help from another person toileting, which includes using toliet, bedpan, or urinal?: A Lot Help from another person bathing (including washing, rinsing, drying)?: A Lot  Help from another person to put on and taking off regular upper body clothing?: A Little Help from another person to put on and taking off regular lower body clothing?: A Lot 6 Click Score: 15   End of Session Equipment Utilized During Treatment: Rolling walker (2 wheels);Gait belt Nurse Communication: Mobility status  Activity Tolerance: Patient tolerated treatment well Patient left: in bed;with call  bell/phone within reach  OT Visit Diagnosis: Unsteadiness on feet (R26.81);Other abnormalities of gait and mobility (R26.89);Repeated falls (R29.6);Muscle weakness (generalized) (M62.81);Pain                Time: 9047-8980 OT Time Calculation (min): 27 min Charges:  OT General Charges $OT Visit: 1 Visit OT Evaluation $OT Eval Moderate Complexity: 1 Mod  Lucie Kendall, OTR/L Acute Rehabilitation Services Office [phone] Secure Chat Communication Preferred   Lucie JONETTA Kendall 01/28/2024, 11:07 AM

## 2024-01-28 NOTE — ED Notes (Signed)
Pt transported back to MRI.  

## 2024-01-28 NOTE — TOC Initial Note (Signed)
 Transition of Care [facility]) - Initial/Assessment Note    Patient Details  Name: Dennis Zhang MRN: [id number] Date of Birth: [birth year]  Transition of Care [facility]) CM/SW Contact:    Inocente GORMAN Kindle, LCSW Phone Number: 01/28/2024, 2:24 PM  Clinical Narrative:                 Patient admitted from home alone with support of neighbors undergoing stroke workup. CSW following for CIR determination of candidacy.     Barriers to Discharge: Continued Medical Work up, English as a second language teacher   Patient Goals and CMS Choice            Expected Discharge Plan and Services In-house Referral: Clinical Social Work     Living arrangements for the past 2 months: Apartment                                      Prior Living Arrangements/Services Living arrangements for the past 2 months: Apartment Lives with:: Self Patient language and need for interpreter reviewed:: Yes Do you feel safe going back to the place where you live?: Yes      Need for Family Participation in Patient Care: Yes (Comment) Care giver support system in place?: Yes (comment)   Criminal Activity/Legal Involvement Pertinent to Current Situation/Hospitalization: No - Comment as needed  Activities of Daily Living      Permission Sought/Granted Permission sought to share information with : Facility Industrial/product designer granted to share information with : Yes, Verbal Permission Granted     Permission granted to share info w AGENCY: CIR        Emotional Assessment Appearance:: Appears stated age Attitude/Demeanor/Rapport: Unable to Assess Affect (typically observed): Unable to Assess   Alcohol  / Substance Use: Not Applicable Psych Involvement: No (comment)  Admission diagnosis:  Hypokalemia [E87.6] Weakness [R53.1] CVA (cerebral vascular accident) (HCC) [I63.9] Diarrhea, unspecified type [R19.7] Acute CVA (cerebrovascular accident) [facility]) [I63.9] Patient Active Problem List   Diagnosis  Date Noted   CVA (cerebral vascular accident) (HCC) 01/28/2024   Acute CVA (cerebrovascular accident) (HCC) 01/28/2024   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   No post-op complications 12/11/2023   Spontaneous pneumothorax 07/25/2017   Alcohol  withdrawal syndrome with complication (HCC)    Elevated lactic acid level    Seizure (HCC)    Leukopenia 05/23/2016   Lactic acidosis 05/23/2016   Syncope and collapse 05/22/2016   Syncope 06/15/2014   Hypotension 06/15/2014   Fall    Arterial hypotension    Weakness    Dehydration 06/14/2014   Protein-calorie malnutrition, severe (HCC) 05/21/2014   Acute kidney injury (HCC) 05/20/2014   Orthostatic hypotension 05/20/2014   Alcohol  abuse 05/20/2014   Transaminitis 05/20/2014   Thrombocytopenia (HCC) 05/20/2014   Acute renal failure (HCC) 03/03/2014   Alcohol  intoxication (HCC) 03/03/2014   Increased anion gap metabolic acidosis 03/03/2014   Malnutrition of moderate degree (HCC) 03/03/2014   Hypokalemia 10/06/2012   Gram-negative bacteremia 10/06/2012   Leukocytosis 10/03/2012   Chronic pancreatitis (HCC) 10/03/2012   AKI (acute kidney injury) (HCC) 10/03/2012   Alcoholic hepatitis 10/03/2012   Hypoglycemia secondary to sulfonylurea 10/03/2012   PCP:  Clinic, Bonni Lien Pharmacy:   [id number] - [place], [place] - [address] AT [facility] OF [address] [place] [address] Phone: [phone] Fax: [phone]  [id number] - [place], [place] - [address] AT [facility] [address]  [place] [address] Phone: [phone] Fax: [phone]  [facility] - [place], [place] - [address] [place] [address] Phone: [phone] Fax: [phone]     Social Drivers of Health (SDOH) Social History: SDOH Screenings   Food Insecurity: No Food  Insecurity (12/11/2023)  Housing: High Risk (12/11/2023)  Transportation Needs: No Transportation Needs (12/11/2023)  Utilities: Not At Risk (12/11/2023)  Depression (PHQ2-9): Medium Risk (09/28/2020)  Social Connections: Moderately Isolated (12/11/2023)  Tobacco Use: High Risk (01/27/2024)   SDOH Interventions:     Readmission Risk Interventions     No data to display

## 2024-01-28 NOTE — Plan of Care (Signed)
 Called by ED provider regarding patient feeling like he is having a stroke. No clear deficits other than facial droop on the left. Recommend MRI of the brain-if positive for stroke, please call for formal consultation.  Eligio Lav, MD Neurology

## 2024-01-28 NOTE — ED Notes (Signed)
 Pt extremely hard stick, unable to get blood from both IV's for cultures. Phlebotomy also attempted but was unsuccessful.

## 2024-01-28 NOTE — H&P (Signed)
 History and Physical    Dennis Zhang FMW:[id number] DOB: 15-Mar-1956 DOA: 01/27/2024  PCP: [facility]   Patient coming from: Home  Chief Complaint: Left facial droop and slurred speech with left-sided weakness  HPI: Dennis Zhang is a 68 y.o. 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  EMS reported that patient was having some tachycardia and appeared to be atrial fibrillation at that time.  He is noted to have some diarrhea over the last 5 days or so with no nausea or vomiting.  He states as a result of the diarrhea he has been feeling quite weak.  He denies any fevers or chills.   ED Course: Vital signs with some elevated blood pressure readings noted and potassium of 2.4 and creatinine 2.10.  CT head with no acute findings, but MRI that was subsequently performed demonstrated right frontal and right occipital acute ischemic CVA.  Neurology was consulted for further evaluation.  Review of Systems: Reviewed as noted above, otherwise negative.  Past Medical History:  Diagnosis Date   Arthritis    Chronic pain    Depression    Diabetes mellitus without complication (HCC)    borderline   Hepatitis    Hyperlipidemia    Hypertension    Seizures (HCC)     Past Surgical History:  Procedure Laterality Date   ABDOMINAL SURGERY     APPENDECTOMY     CIRCUMCISION N/A 12/11/2023   Procedure: CIRCUMCISION, ADULT;  Surgeon: Elisabeth Valli BIRCH, MD;  Location: [facility];  Service: Urology;  Laterality: N/A;  CIRCUMCISION, EXCISIONAL PENILE BIOPSY   CYST EXCISION     HEMORROIDECTOMY     PENILE BIOPSY N/A 12/11/2023   Procedure: BIOPSY, PENIS;  Surgeon: Elisabeth Valli BIRCH, MD;  Location: [facility];  Service: Urology;  Laterality: N/A;     reports that he  has been smoking cigarettes. He has a 15 pack-year smoking history. He has never used smokeless tobacco. He reports current alcohol  use. He reports that he does not use drugs.  Allergies  Allergen Reactions   Ace Inhibitors     Kidney injury 05/2014-do not Rx   Aspirin  Nausea Only and Other (See Comments)    Reaction to Bayer aspirin  - causes acid reflux and nausea   Metformin Other (See Comments)    Other reaction(s): Abdominal pain   Methocarbamol Nausea And Vomiting   Tylenol  [Acetaminophen ] Nausea And Vomiting    States can take Tylenol  if has other pain med w/it - like Hydrocodone      Family History  Problem Relation Age of Onset   Hypertension Mother    Migraines Sister    Heart failure Brother    Migraines Brother     Prior to Admission medications   Medication Sig Start Date End Date Taking? Authorizing Provider  acetaminophen  (TYLENOL ) 500 MG tablet TAKE TWO TABLETS BY MOUTH TWICE A DAY AS NEEDED FOR PAIN 04/21/20   [provider]  amLODipine  (NORVASC ) 5 MG tablet Take 1 tablet (5 mg total) by mouth daily. 07/29/17   Silva Zapata, Edwin, MD  Carboxymethylcellulose Sodium 0.25 % SOLN INSTILL 1 DROP IN EACH EYE 2-4 TIMES A DAY 10/13/19   [provider]  feeding supplement, ENSURE COMPLETE, (ENSURE COMPLETE) LIQD Take 237 mLs by mouth 3 (three) times daily between meals.  06/16/14   Samtani, Jai-Gurmukh, MD  fluocinonide cream (LIDEX) 0.05 % APPLY SMALL AMOUNT TO AFFECTED AREA TWICE A DAY 06/28/20   [provider]  folic acid  (FOLVITE ) 1 MG tablet Take 1 tablet (1 mg total) by mouth daily. 05/24/16   Jerri Keys, MD  gabapentin  (NEURONTIN ) 300 MG capsule Take 1 capsule (300 mg total) by mouth 3 (three) times daily. Patient taking differently: Take 300 mg by mouth 4 (four) times daily as needed (for nerve pain.). 07/31/12   Schinlever, Dorothyann, PA-C  levETIRAcetam  (KEPPRA ) 500 MG tablet Take 1 tablet (500 mg total) by mouth 2 (two) times daily. Patient taking  differently: Take 500 mg by mouth daily. 06/13/16   Sofia, Leslie K, PA-C  lipase/protease/amylase (CREON) 36000 UNITS CPEP capsule Take by mouth. 08/22/22   [provider]  lisinopril  (ZESTRIL ) 2.5 MG tablet Take by mouth. 01/30/23   [provider]  Magnesium  Oxide 420 MG TABS Take 420 mg by mouth daily.     [provider]  meloxicam (MOBIC) 15 MG tablet Take 15 mg by mouth daily.    [provider]  Naphazoline HCl (CLEAR EYES OP) Place 1 drop into both eyes daily.    [provider]  oxyCODONE  (ROXICODONE ) 5 MG immediate release tablet Take 1 tablet (5 mg total) by mouth every 6 (six) hours as needed for severe pain (pain score 7-10). 03/14/23   Towana Ozell BROCKS, MD  pantoprazole  (PROTONIX ) 40 MG tablet Take 1 tablet (40 mg total) by mouth daily. 05/24/16   Jerri Keys, MD  sildenafil (VIAGRA) 100 MG tablet Take 50 mg by mouth daily as needed for erectile dysfunction.     [provider]  tamsulosin  (FLOMAX ) 0.4 MG CAPS capsule Take 1 capsule (0.4 mg total) by mouth daily after breakfast. 05/23/16   Jerri Keys, MD  terbinafine (LAMISIL) 1 % cream APPLY MODERATE AMOUNT TO AFFECTED AREA TWICE A DAY 06/28/20   [provider]  thiamine  100 MG tablet Take 1 tablet (100 mg total) by mouth daily. 05/24/16   Jerri Keys, MD  traMADol  (ULTRAM ) 50 MG tablet Take 1 tablet (50 mg total) by mouth every 6 (six) hours as needed. 12/11/23 12/10/24  Pace, Maryellen D, MD  urea (CARMOL) 20 % cream APPLY MODERATE AMOUNT TO AFFECTED AREA TWICE A DAY 06/28/20   [provider]    Physical Exam: Vitals:   01/28/24 0300 01/28/24 0315 01/28/24 0330 01/28/24 0331  BP: (!) 170/89  (!) 170/118   Pulse:      Resp: 14 (!) 22 16   Temp:    (!) 97.4 F (36.3 C)  TempSrc:    Temporal  SpO2:   98%     Constitutional: NAD, calm, comfortable, thin and frail appearing Vitals:   01/28/24 0300 01/28/24 0315 01/28/24 0330 01/28/24 0331  BP: (!) 170/89  (!) 170/118    Pulse:      Resp: 14 (!) 22 16   Temp:    (!) 97.4 F (36.3 C)  TempSrc:    Temporal  SpO2:   98%    Eyes: lids and conjunctivae normal Neck: normal, supple Respiratory: clear to auscultation bilaterally. Normal respiratory effort. No accessory muscle use.  Cardiovascular: Regular rate and rhythm, no murmurs. Abdomen: no tenderness, no distention. Bowel sounds positive.  Musculoskeletal:  No edema. Skin: no rashes, lesions, ulcers.  Psychiatric: Flat affect  Labs on Admission: I have personally reviewed following labs and imaging studies  CBC: Recent Labs  Lab 01/27/24 1809 01/27/24 1838  WBC 9.8  --   NEUTROABS 7.2  --   HGB 15.2 17.0  HCT 45.3 50.0  MCV 101.8*  --   PLT 193  --    Basic Metabolic Panel: Recent Labs  Lab 01/27/24 1809 01/27/24 1838  NA 140 144  K 2.2* 2.4*  CL 101 103  CO2 24  --   GLUCOSE 149* 153*  BUN 12 18  CREATININE 2.11* 2.10*  CALCIUM  9.2  --    GFR: CrCl cannot be calculated (Unknown ideal weight.). Liver Function Tests: Recent Labs  Lab 01/27/24 1809  AST 57*  ALT 22  ALKPHOS 196*  BILITOT 1.0  PROT 8.1  ALBUMIN 2.7*   No results for input(s): LIPASE, AMYLASE in the last 168 hours. No results for input(s): AMMONIA in the last 168 hours. Coagulation Profile: No results for input(s): INR, PROTIME in the last 168 hours. Cardiac Enzymes: No results for input(s): CKTOTAL, CKMB, CKMBINDEX, TROPONINI in the last 168 hours. BNP (last 3 results) No results for input(s): PROBNP in the last 8760 hours. HbA1C: No results for input(s): HGBA1C in the last 72 hours. CBG: Recent Labs  Lab 01/27/24 1823  GLUCAP 112*   Lipid Profile: No results for input(s): CHOL, HDL, LDLCALC, TRIG, CHOLHDL, LDLDIRECT in the last 72 hours. Thyroid  Function Tests: No results for input(s): TSH, T4TOTAL, FREET4, T3FREE, THYROIDAB in the last 72 hours. Anemia Panel: No results for input(s): VITAMINB12,  FOLATE, FERRITIN, TIBC, IRON, RETICCTPCT in the last 72 hours. Urine analysis:    Component Value Date/Time   COLORURINE YELLOW 03/14/2023 2015   APPEARANCEUR CLEAR 03/14/2023 2015   LABSPEC 1.011 03/14/2023 2015   PHURINE 6.0 03/14/2023 2015   GLUCOSEU NEGATIVE 03/14/2023 2015   HGBUR NEGATIVE 03/14/2023 2015   BILIRUBINUR NEGATIVE 03/14/2023 2015   KETONESUR NEGATIVE 03/14/2023 2015   PROTEINUR NEGATIVE 03/14/2023 2015   UROBILINOGEN 1.0 06/14/2014 1847   NITRITE NEGATIVE 03/14/2023 2015   LEUKOCYTESUR NEGATIVE 03/14/2023 2015    Radiological Exams on Admission: MR BRAIN WO CONTRAST Result Date: 01/28/2024 CLINICAL DATA:  Neuro deficit, acute, stroke suspected EXAM: MRI HEAD WITHOUT CONTRAST TECHNIQUE: Multiplanar, multiecho pulse sequences of the brain and surrounding structures were obtained without intravenous contrast. COMPARISON:  CT head 01/27/2024. 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 Mild edema. Punctate acute or early subacute infarct in the right occipital lobe. No mass effect. Patchy white matter T2 hyperintensities, compatible with chronic microvascular ischemic disease. Cerebral atrophy. No evidence of acute hemorrhage, mass lesion, less shift or hydrocephalus. Vascular: Normal flow voids. Skull and upper cervical spine: Normal marrow signal. Sinuses/Orbits: Negative. Other: No mastoid effusions. IMPRESSION: 1. Small acute right pericallosal frontal lobe infarct. 2. Punctate acute or subacute infarct in the right occipital lobe. Electronically Signed   By: Gilmore GORMAN Molt M.D.   On: 01/28/2024 02:49   CT Cervical Spine Wo Contrast Result Date: 01/27/2024 CLINICAL DATA:  Neck trauma EXAM: CT CERVICAL SPINE WITHOUT CONTRAST TECHNIQUE: Multidetector CT imaging of the cervical spine was performed without intravenous contrast. Multiplanar CT image reconstructions were also generated. RADIATION DOSE REDUCTION: This exam was performed  according to the departmental dose-optimization program which includes automated exposure control, adjustment of the mA and/or kV according to patient size and/or use of iterative reconstruction technique. COMPARISON:  08/03/2023 FINDINGS: Alignment: Normal Skull base and vertebrae: No acute fracture. No primary bone lesion or focal pathologic process. Soft tissues and spinal canal: No prevertebral fluid or swelling. No  visible canal hematoma. Disc levels: Advanced multi level degenerative disc and facet disease. Upper chest: No acute findings.  Emphysema. Other: None IMPRESSION: Advanced multilevel degenerative changes. No acute bony abnormality. Emphysema. Electronically Signed   By: Franky Crease M.D.   On: 01/27/2024 22:17   CT HEAD WO CONTRAST Result Date: 01/27/2024 CLINICAL DATA:  Possible TIA EXAM: CT HEAD WITHOUT CONTRAST TECHNIQUE: Contiguous axial images were obtained from the base of the skull through the vertex without intravenous contrast. RADIATION DOSE REDUCTION: This exam was performed according to the departmental dose-optimization program which includes automated exposure control, adjustment of the mA and/or kV according to patient size and/or use of iterative reconstruction technique. COMPARISON:  08/03/2023 FINDINGS: Brain: There is atrophy and chronic small vessel disease changes. No acute intracranial abnormality. Specifically, no hemorrhage, hydrocephalus, mass lesion, acute infarction, or significant intracranial injury. Vascular: No hyperdense vessel or unexpected calcification. Skull: No acute calvarial abnormality. Sinuses/Orbits: No acute findings Other: None IMPRESSION: Atrophy, chronic microvascular disease. No acute intracranial abnormality. Electronically Signed   By: Franky Crease M.D.   On: 01/27/2024 22:14    EKG: Independently reviewed. SR 94 bpm.  Assessment/Plan Principal Problem:   CVA (cerebral vascular accident) (HCC) Active Problems:   Hypokalemia   Malnutrition  of moderate degree (HCC)   Acute kidney injury (HCC)   Seizure (HCC)    Acute ischemic CVA - Continue aspirin  81 mg and Plavix  75 mg daily - Monitor on telemetry for atrial fibrillation - Lipid panel and A1c - 2D echocardiogram - MRA head without contrast and carotid Dopplers - PT/OT evaluation  Hypokalemia - Continue potassium repletion and check on magnesium  - Noted to have some runs of PVCs on telemetry with bigeminy  AKI on CKDIIIa -Creatinine appears to be 1.4-1.7 at baseline - IV fluid - Monitor labs - Avoid nephrotoxic agents - Strict I's and O's  Hypertension - Hold lisinopril  due to AKI - Continue amlodipine   Dyslipidemia - Check lipid panel  Type 2 diabetes with hyperglycemia - SSI and carb modified diet  Depression  Chronic pain - Continue oxycodone   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 Observation status is judged to be reasonable and necessary in order to provide the required intensity of service to ensure the patient's safety. The patient's presenting symptoms, physical exam findings, and initial radiographic and laboratory data in the context of their medical condition is felt to place them at decreased risk for further clinical deterioration. Furthermore, it is anticipated that the patient will be medically stable for discharge from the hospital within 2 midnights of admission.    Albeiro Trompeter D Maree DO [facility]  If 7PM-7AM, please contact night-coverage [email or web address]  01/28/2024, 3:52 AM

## 2024-01-28 NOTE — Progress Notes (Signed)
 Inpatient Rehab Admissions Coordinator Note:   Per therapy recommendations patient was screened for CIR candidacy by Reche FORBES Lowers, PT. At this time, pt appears to be a potential candidate for CIR. I will place an order for rehab consult for full assessment, per our protocol.  Please contact me any with questions.SABRA Reche Lowers, PT, DPT [phone] 01/28/24 1:33 PM

## 2024-01-28 NOTE — Progress Notes (Signed)
 Hypoglycemic Event  CBG: 63  Treatment: 4 oz juice/soda  Symptoms: Pale  Follow-up CBG: Time:1300 CBG Result:62  Possible Reasons for Event: Inadequate meal intake  Comments/MD notified: Swayze, Ava DO notified    Moorea Boissonneault

## 2024-01-28 NOTE — Evaluation (Signed)
 Physical Therapy Evaluation Patient Details Name: Dennis Zhang MRN: [id number] DOB: 03-29-1956 Today's Date: 01/28/2024  History of Present Illness  Pt is 68 yo presenting [facility] on [facility]/7 due to L sided facial droop and slurred speech. Mri with findings of R frontal and occipital acute ischemic CVA. PMH: chrnoic pain, depression, DM II, dyslipidemia, HTN, CKD.  Clinical Impression  Pt is presenting below baseline level of functioning. Currently Min to Mod A for bed mobility, Min A for sit to stand and and CGA to Mod A for [place] distance gait. Pt has rollator at home and sits when he begins to tremor normally. Pt has neighbors who are able to assist him when needed. Due to pt current functional status, home set up and available assistance at home recommending skilled physical therapy services > 3 hours/day in order to address strength, balance and functional mobility to decrease risk for falls, injury, immobility, skin break down and re-hospitalization.        If plan is discharge home, recommend the following: A little help with walking and/or transfers;Assistance with cooking/housework;Assist for transportation     Equipment Recommendations Wheelchair (measurements PT);Wheelchair cushion (measurements PT)  Recommendations for Other Services  Rehab consult    Functional Status Assessment Patient has had a recent decline in their functional status and demonstrates the ability to make significant improvements in function in a reasonable and predictable amount of time.     Precautions / Restrictions Precautions Precautions: Fall Recall of Precautions/Restrictions: Impaired Restrictions Weight Bearing Restrictions Per Provider Order: No      Mobility  Bed Mobility Overal bed mobility: Needs Assistance Bed Mobility: Supine to Sit, Sit to Supine     Supine to sit: Min assist Sit to supine: Mod assist        Transfers Overall transfer level: Needs assistance Equipment  used: Rolling walker (2 wheels) Transfers: Sit to/from Stand Sit to Stand: Min assist                Ambulation/Gait Ambulation/Gait assistance: Mod assist, Contact guard assist Gait Distance (Feet): 150 Feet Assistive device: Rolling walker (2 wheels) Gait Pattern/deviations: Step-through pattern, Narrow base of support, Decreased step length - right, Decreased step length - left Gait velocity: decreased Gait velocity interpretation: <1.31 ft/sec, indicative of household ambulator   General Gait Details: Very short step length bil. Pt initially CGA with fatigue at ~125 ft pt began to tremor and required Mod A with poor management of AD due to tremors in the bil UE/LE. Pt states this happens at home and he locks his rollator and sits down. Pt was assisted into the RW and was able to ambulate 25 ft to bed at intermittent CGA to Mod A depending on tremor severity.    Modified Rankin (Stroke Patients Only) Modified Rankin (Stroke Patients Only) Pre-Morbid Rankin Score: Slight disability Modified Rankin: Moderate disability     Balance Overall balance assessment: Needs assistance Sitting-balance support: Feet supported Sitting balance-Leahy Scale: Fair     Standing balance support: Bilateral upper extremity supported, During functional activity Standing balance-Leahy Scale: Poor       Pertinent Vitals/Pain Pain Assessment Pain Assessment: Faces Faces Pain Scale: Hurts little more Pain Location: hips, back, neck Pain Descriptors / Indicators: Discomfort Pain Intervention(s): Monitored during session, Limited activity within patient's tolerance, Patient requesting pain meds-RN notified    Home Living Family/patient expects to be discharged to:: Private residence Living Arrangements: Alone   Type of Home: Apartment Home Access: Level entry  Home Layout: One level Home Equipment: Cane - single point;Shower seat;Grab bars - tub/shower;Grab bars -  toilet;Wheelchair - manual;Rollator (4 wheels) Additional Comments: Pt is supposed to get an aide on sept. 12th (he states he/she can come 7 days/wk)    Prior Function Prior Level of Function : History of Falls (last six months)             Mobility Comments: walks with a RW vs. SPC ADLs Comments: mod I for ADLs, manages his medications, does not drive     Extremity/Trunk Assessment   Upper Extremity Assessment Upper Extremity Assessment: Defer to OT evaluation RUE Deficits / Details: generally weak, functional with use of RW and bed mobility. Needs further testing of coordination and motor planning LUE Deficits / Details: generally weak, functional with use of RW and bed mobility. Needs further testing of coordination and motor planning    Lower Extremity Assessment Lower Extremity Assessment: Generalized weakness    Cervical / Trunk Assessment Cervical / Trunk Assessment: Kyphotic  Communication   Communication Communication: No apparent difficulties    Cognition Arousal: Alert Behavior During Therapy: Flat affect   PT - Cognitive impairments: No apparent impairments   Following commands: Impaired Following commands impaired: Only follows one step commands consistently, Follows multi-step commands inconsistently     Cueing Cueing Techniques: Verbal cues     General Comments General comments (skin integrity, edema, etc.): VSS on RA        Assessment/Plan    PT Assessment Patient needs continued PT services  PT Problem List Decreased strength;Decreased balance;Decreased activity tolerance;Decreased mobility;Decreased coordination;Decreased safety awareness       PT Treatment Interventions DME instruction;Functional mobility training;Gait training;Therapeutic activities;Balance training;Neuromuscular re-education;Patient/family education;Wheelchair mobility training;Therapeutic exercise    PT Goals (Current goals can be found in the Care Plan section)  Acute  Rehab PT Goals Patient Stated Goal: to improve mobility and return home PT Goal Formulation: With patient Time For Goal Achievement: 02/11/24 Potential to Achieve Goals: Good    Frequency Min 3X/week     Co-evaluation   Reason for Co-Treatment: Complexity of the patient's impairments (multi-system involvement);To address functional/ADL transfers;For patient/therapist safety PT goals addressed during session: Mobility/safety with mobility;Balance;Proper use of DME OT goals addressed during session: ADL's and self-care       AM-PAC PT 6 Clicks Mobility  Outcome Measure Help needed turning from your back to your side while in a flat bed without using bedrails?: A Little Help needed moving from lying on your back to sitting on the side of a flat bed without using bedrails?: A Little Help needed moving to and from a bed to a chair (including a wheelchair)?: A Little Help needed standing up from a chair using your arms (e.g., wheelchair or bedside chair)?: A Little Help needed to walk in hospital room?: A Little Help needed climbing 3-5 steps with a railing? : Total 6 Click Score: 16    End of Session Equipment Utilized During Treatment: Gait belt Activity Tolerance: Patient tolerated treatment well Patient left: in bed;with call bell/phone within reach Nurse Communication: Mobility status PT Visit Diagnosis: Unsteadiness on feet (R26.81);Other abnormalities of gait and mobility (R26.89);Repeated falls (R29.6);Muscle weakness (generalized) (M62.81)    Time: 9048-8980 PT Time Calculation (min) (ACUTE ONLY): 28 min   Charges:   PT Evaluation $PT Eval Low Complexity: 1 Low   PT General Charges $$ ACUTE PT VISIT: 1 Visit        Dorothyann Maier, DPT, CLT  Acute Rehabilitation Services  Office: [phone] (Secure chat preferred)   Dorothyann VEAR Maier 01/28/2024, 11:45 AM

## 2024-01-28 NOTE — Progress Notes (Signed)
 Carotid exam is completed. Kleo Dungee, RVT

## 2024-01-28 NOTE — ED Notes (Signed)
 Breakfast tray delivered. Patient did not like meal delivered and states he won't eat it.   New tray ordered with eggs and [place] sausage for patient.

## 2024-01-28 NOTE — Progress Notes (Addendum)
 STROKE TEAM PROGRESS NOTE    SIGNIFICANT HOSPITAL EVENTS 9/8: admitted  INTERIM HISTORY/SUBJECTIVE Patient was admitted for facial droop and slurred speech starting 01/26/24.  EMS crew reported patient was in and out of A-fib on the way to the hospital however we have not seen any documented rhythm strip and EKG documenting A-fib.  OBJECTIVE  CBC    Component Value Date/Time   WBC 14.0 (H) 01/28/2024 0328   RBC 3.80 (L) 01/28/2024 0328   HGB 13.2 01/28/2024 0328   HCT 38.4 (L) 01/28/2024 0328   PLT 180 01/28/2024 0328   MCV 101.1 (H) 01/28/2024 0328   MCH 34.7 (H) 01/28/2024 0328   MCHC 34.4 01/28/2024 0328   RDW 15.2 01/28/2024 0328   LYMPHSABS 1.9 01/27/2024 1809   MONOABS 0.6 01/27/2024 1809   EOSABS 0.0 01/27/2024 1809   BASOSABS 0.0 01/27/2024 1809    BMET    Component Value Date/Time   NA 143 01/28/2024 0338   K 2.3 (LL) 01/28/2024 0338   CL 104 01/28/2024 0338   CO2 25 01/28/2024 0338   GLUCOSE 111 (H) 01/28/2024 0338   BUN 13 01/28/2024 0338   CREATININE 2.07 (H) 01/28/2024 0338   CALCIUM  8.7 (L) 01/28/2024 0338   GFRNONAA 34 (L) 01/28/2024 0338    IMAGING past 24 hours VAS US  CAROTID Result Date: 01/28/2024 Carotid Arterial Duplex Study Patient Name:  Dennis Zhang  Date of Exam:   01/28/2024 Medical Rec #: [id number]        Accession #:    [id number] Date of Birth: 1955/12/02         Patient Gender: M Patient Age:   68 years Exam Location:  [facility] Procedure:      VAS US  CAROTID Referring Phys: ELIGIO LAV --------------------------------------------------------------------------------  Indications:  CVA. Risk Factors: Hypertension, hyperlipidemia, Diabetes, current smoker. Performing Technologist: Elmarie Lindau, RVT  Examination Guidelines: A complete evaluation includes B-mode imaging, spectral Doppler, color Doppler, and power Doppler as needed of all accessible portions of each vessel. Bilateral testing is considered an integral part of a complete  examination. Limited examinations for reoccurring indications may be performed as noted.  Right Carotid Findings: +----------+-------+-------+--------+---------------------------------+--------+           PSV    EDV    StenosisPlaque Description               Comments           cm/s   cm/s                                                     +----------+-------+-------+--------+---------------------------------+--------+ CCA Prox  76     11                                                       +----------+-------+-------+--------+---------------------------------+--------+ CCA Distal60     14             irregular and heterogenous                +----------+-------+-------+--------+---------------------------------+--------+ ICA Prox  118    40     1-39%   irregular, calcific and  heterogenous                              +----------+-------+-------+--------+---------------------------------+--------+ ICA Mid   126    41                                                       +----------+-------+-------+--------+---------------------------------+--------+ ICA Distal100    35                                                       +----------+-------+-------+--------+---------------------------------+--------+ ECA       81                                                              +----------+-------+-------+--------+---------------------------------+--------+ +----------+--------+-------+----------------+-------------------+           PSV cm/sEDV cmsDescribe        Arm Pressure (mmHG) +----------+--------+-------+----------------+-------------------+ [id number]            Multiphasic, WNL                    +----------+--------+-------+----------------+-------------------+ +---------+--------+--+--------+--+---------+ VertebralPSV cm/s73EDV cm/s16Antegrade  +---------+--------+--+--------+--+---------+  Left Carotid Findings: +----------+--------+--------+--------+--------------------------+--------+           PSV cm/sEDV cm/sStenosisPlaque Description        Comments +----------+--------+--------+--------+--------------------------+--------+ CCA Prox  98      21                                                 +----------+--------+--------+--------+--------------------------+--------+ CCA Distal62      20              smooth and heterogenous            +----------+--------+--------+--------+--------------------------+--------+ ICA Prox  87      28              irregular and heterogenous         +----------+--------+--------+--------+--------------------------+--------+ ICA Mid   111     31                                                 +----------+--------+--------+--------+--------------------------+--------+ ICA Distal139     43                                                 +----------+--------+--------+--------+--------------------------+--------+ ECA       104                                                        +----------+--------+--------+--------+--------------------------+--------+ +----------+--------+--------+----------------+-------------------+  PSV cm/sEDV cm/sDescribe        Arm Pressure (mmHG) +----------+--------+--------+----------------+-------------------+ [id number]             Multiphasic, WNL                    +----------+--------+--------+----------------+-------------------+ +---------+--------+--+--------+--+---------+ VertebralPSV cm/s53EDV cm/s13Antegrade +---------+--------+--+--------+--+---------+   Summary: Right Carotid: Velocities in the right ICA are consistent with a 1-39% stenosis. Left Carotid: Velocities in the left ICA are consistent with a 1-39% stenosis. Vertebrals:  Bilateral vertebral arteries demonstrate antegrade flow. Subclavians: Normal  flow hemodynamics were seen in bilateral subclavian              arteries. *See table(s) above for measurements and observations.     Preliminary    ECHOCARDIOGRAM COMPLETE Result Date: 01/28/2024    ECHOCARDIOGRAM REPORT   Patient Name:   Dennis Zhang Date of Exam: 01/28/2024 Medical Rec #:  [id number]       Height:       74.0 in Accession #:    [id number]      Weight:       148.0 lb Date of Birth:  1956/03/09        BSA:          1.912 m Patient Age:    68 years        BP:           142/114 mmHg Patient Gender: M               HR:           96 bpm. Exam Location:  Inpatient Procedure: 2D Echo, Cardiac Doppler and Color Doppler (Both Spectral and Color            Flow Doppler were utilized during procedure). Indications:    CVA  History:        Patient has no prior history of Echocardiogram examinations.  Sonographer:    Therisa Crouch Referring Phys: [id number] ASHISH ARORA IMPRESSIONS  1. Left ventricular ejection fraction, by estimation, is 60 to 65%. The left ventricle has normal function. The left ventricle has no regional wall motion abnormalities. Left ventricular diastolic parameters were normal.  2. Right ventricular systolic function is normal. The right ventricular size is normal.  3. The mitral valve is abnormal. No evidence of mitral valve regurgitation. No evidence of mitral stenosis.  4. Sclerosis of non coronary cusp. The aortic valve is tricuspid. There is moderate calcification of the aortic valve. There is mild thickening of the aortic valve. Aortic valve regurgitation is not visualized. Aortic valve sclerosis is present, with no  evidence of aortic valve stenosis.  5. The inferior vena cava is normal in size with greater than 50% respiratory variability, suggesting right atrial pressure of 3 mmHg. FINDINGS  Left Ventricle: Left ventricular ejection fraction, by estimation, is 60 to 65%. The left ventricle has normal function. The left ventricle has no regional wall motion abnormalities. Strain was  performed and the global longitudinal strain is indeterminate. The left ventricular internal cavity size was normal in size. There is no left ventricular hypertrophy. Left ventricular diastolic parameters were normal. Right Ventricle: The right ventricular size is normal. No increase in right ventricular wall thickness. Right ventricular systolic function is normal. Left Atrium: Left atrial size was normal in size. Right Atrium: Right atrial size was normal in size. Pericardium: Trivial pericardial effusion is present. The pericardial effusion is posterior to the left ventricle. Mitral Valve: The mitral valve is abnormal. There  is mild thickening of the mitral valve leaflet(s). No evidence of mitral valve regurgitation. No evidence of mitral valve stenosis. Tricuspid Valve: The tricuspid valve is normal in structure. Tricuspid valve regurgitation is mild . No evidence of tricuspid stenosis. Aortic Valve: Sclerosis of non coronary cusp. The aortic valve is tricuspid. There is moderate calcification of the aortic valve. There is mild thickening of the aortic valve. Aortic valve regurgitation is not visualized. Aortic valve sclerosis is present, with no evidence of aortic valve stenosis. Pulmonic Valve: The pulmonic valve was normal in structure. Pulmonic valve regurgitation is not visualized. No evidence of pulmonic stenosis. Aorta: The aortic root is normal in size and structure. Venous: The inferior vena cava is normal in size with greater than 50% respiratory variability, suggesting right atrial pressure of 3 mmHg. IAS/Shunts: No atrial level shunt detected by color flow Doppler. Additional Comments: 3D was performed not requiring image post processing on an independent workstation and was indeterminate.  LEFT VENTRICLE PLAX 2D LVIDd:         2.94 cm   Diastology LVIDs:         1.74 cm   LV e' medial:    9.25 cm/s LV PW:         1.04 cm   LV E/e' medial:  7.3 LV IVS:        1.06 cm   LV e' lateral:   9.57 cm/s LVOT  diam:     2.14 cm   LV E/e' lateral: 7.1 LVOT Area:     3.60 cm  RIGHT VENTRICLE             IVC RV S prime:     12.30 cm/s  IVC diam: 1.07 cm TAPSE (M-mode): 1.8 cm LEFT ATRIUM             Index LA diam:        2.15 cm 1.12 cm/m LA Vol (A2C):   48.7 ml 25.47 ml/m LA Vol (A4C):   35.7 ml 18.67 ml/m LA Biplane Vol: 41.3 ml 21.60 ml/m   AORTA Ao Root diam: 3.48 cm Ao Asc diam:  3.44 cm MITRAL VALVE MV Area (PHT): 4.24 cm    SHUNTS MV Decel Time: 179 msec    Systemic Diam: 2.14 cm MV E velocity: 67.60 cm/s MV A velocity: 77.00 cm/s MV E/A ratio:  0.88 Maude Emmer MD Electronically signed by Maude Emmer MD Signature Date/Time: 01/28/2024/10:41:33 AM    Final    MR ANGIO HEAD WO CONTRAST Result Date: 01/28/2024 EXAM: MR Angiography Head without intravenous Contrast. 01/28/2024 05:00:53 AM TECHNIQUE: Magnetic resonance angiography images of the head without intravenous contrast. Multiplanar 2D and 3D reformatted images are provided for review. COMPARISON: None provided. CLINICAL HISTORY: Stroke/TIA, determine embolic source. FINDINGS: ANTERIOR CIRCULATION: No significant stenosis of the internal carotid arteries. No significant stenosis of the anterior cerebral arteries. No significant stenosis of the middle cerebral arteries. No aneurysm. 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 Fenestration of the basilar artery. No significant stenosis of the posterior cerebral arteries. No significant stenosis of the basilar artery. No significant stenosis of the vertebral arteries. No aneurysm. IMPRESSION: 1. No significant stenosis of the intracranial vasculature. 2. 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 Electronically signed by: Evalene Coho MD 01/28/2024 05:14 AM EDT RP Workstation: [id number]   MR BRAIN WO CONTRAST Result Date: 01/28/2024 CLINICAL DATA:  Neuro deficit, acute, stroke suspected EXAM: MRI  HEAD WITHOUT  CONTRAST TECHNIQUE: Multiplanar, multiecho pulse sequences of the brain and surrounding structures were obtained without intravenous contrast. COMPARISON:  CT head 01/27/2024. 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 Mild edema. Punctate acute or early subacute infarct in the right occipital lobe. No mass effect. Patchy white matter T2 hyperintensities, compatible with chronic microvascular ischemic disease. Cerebral atrophy. No evidence of acute hemorrhage, mass lesion, less shift or hydrocephalus. Vascular: Normal flow voids. Skull and upper cervical spine: Normal marrow signal. Sinuses/Orbits: Negative. Other: No mastoid effusions. IMPRESSION: 1. Small acute right pericallosal frontal lobe infarct. 2. Punctate acute or subacute infarct in the right occipital lobe. Electronically Signed   By: Gilmore GORMAN Molt M.D.   On: 01/28/2024 02:49   CT Cervical Spine Wo Contrast Result Date: 01/27/2024 CLINICAL DATA:  Neck trauma EXAM: CT CERVICAL SPINE WITHOUT CONTRAST TECHNIQUE: Multidetector CT imaging of the cervical spine was performed without intravenous contrast. Multiplanar CT image reconstructions were also generated. RADIATION DOSE REDUCTION: This exam was performed according to the departmental dose-optimization program which includes automated exposure control, adjustment of the mA and/or kV according to patient size and/or use of iterative reconstruction technique. COMPARISON:  08/03/2023 FINDINGS: Alignment: Normal Skull base and vertebrae: No acute fracture. No primary bone lesion or focal pathologic process. Soft tissues and spinal canal: No prevertebral fluid or swelling. No visible canal hematoma. Disc levels: Advanced multi level degenerative disc and facet disease. Upper chest: No acute findings.  Emphysema. Other: None IMPRESSION: Advanced multilevel degenerative changes. No acute bony abnormality. Emphysema. Electronically Signed   By: Franky Crease M.D.   On: 01/27/2024  22:17   CT HEAD WO CONTRAST Result Date: 01/27/2024 CLINICAL DATA:  Possible TIA EXAM: CT HEAD WITHOUT CONTRAST TECHNIQUE: Contiguous axial images were obtained from the base of the skull through the vertex without intravenous contrast. RADIATION DOSE REDUCTION: This exam was performed according to the departmental dose-optimization program which includes automated exposure control, adjustment of the mA and/or kV according to patient size and/or use of iterative reconstruction technique. COMPARISON:  08/03/2023 FINDINGS: Brain: There is atrophy and chronic small vessel disease changes. No acute intracranial abnormality. Specifically, no hemorrhage, hydrocephalus, mass lesion, acute infarction, or significant intracranial injury. Vascular: No hyperdense vessel or unexpected calcification. Skull: No acute calvarial abnormality. Sinuses/Orbits: No acute findings Other: None IMPRESSION: Atrophy, chronic microvascular disease. No acute intracranial abnormality. Electronically Signed   By: Franky Crease M.D.   On: 01/27/2024 22:14    Vitals:   01/28/24 0700 01/28/24 1000 01/28/24 1147 01/28/24 1215  BP: 125/77 (!) 150/105 (!) 118/54   Pulse:   81   Resp: 19 13 16    Temp:   98.6 F (37 C) 97.6 F (36.4 C)  TempSrc:   Oral Oral  SpO2:   97%    PHYSICAL EXAM General:  Alert, well-nourished, well-developed patient in no acute distress CV: Regular rate and rhythm on monitor Respiratory:  Regular, unlabored respirations on room air  NEURO:  Mental Status: AA&Ox3, however minimally cooperative with the remainder of neurological exam Speech/Language: speech is with mild dysarthria.  Naming, repetition, fluency, and comprehension intact.  Cranial Nerves:  II: PERRL. Visual fields full.  III, IV, VI: EOMI. Eyelids elevate symmetrically.  V: Sensation is increased on left side of the face VII: Facial droop on L VIII: hearing intact to voice. IX, X: Palate elevates symmetrically. Phonation is normal.   KP:Dynloizm shrug 5/5. XII: tongue is midline without fasciculations. Motor: 5/5 strength to LLE and  LUE. 4/5 strength RUE and RLE Tone: is normal and bulk is normal Sensation-increased on the left side of the body, both upper and lower extremities Coordination: FTN intact bilaterally with ataxia on the right, RUE and RLE drift present Gait- deferred  Most Recent NIH 1 @ 1030 9/8    ASSESSMENT/PLAN  Mr. Dennis Zhang is a 68 y.o. male with history of hypertension, hyperlipidemia, MDD, chronic pain, AUD, CKD admitted for dysarthria and facial droop.  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 Etiology likely embolic source CT head with no acute abnormality. Atrophy and chronic microvascular disease present. MRI acute right pericallosal frontal lobe infarct, subacute infarct in right occipital lobe MRA no significant stenosis of intracranial vasculature Carotid Doppler showed bilateral ICAs with stenosis of 1-39% 2D Echo EF 60-65%.  Abnormal mitral valve, moderate calcification of aortic valve with aortic valve sclerosis LDL 30 HgbA1c 5.1 VTE prophylaxis -heparin  aspirin  81 mg daily prior to admission, now on aspirin  81 mg daily and clopidogrel  75 mg daily for 3 weeks and then clopidogrel  75 mg alone. Therapy recommendations:  Pending Disposition: Pending workup  Atrial fibrillation High suspicion given EMS report EKG with normal sinus rhythm Continue telemetry monitoring Consider outpatient cardiac monitoring  Hypertension Home meds: Holding lisinopril  2.5 mg Stable in 150-160/100s BP goal of normotension, allow permissive HTN today  Hyperlipidemia Home meds: Atorvastatin  10 mg, resumed in hospital LDL 30, goal < 70 High intensity statin not indicated Continue statin at discharge  AKI, CKD, hypokalemia Potassium repleted Primary team following  Tobacco Abuse Patient smokes 1/2 packs  per day for 30 years Nicotine  replacement therapy declined  Substance Abuse Patient with history of cocaine abuse, denies use of any illicit or recreational drugs recently UDS pending  Other Active Problems MDD -> 30 mg Cymbalta  daily Neuropathy -> continue gabapentin  300 mg 4 times daily BPH -> continue tamsulosin  0.4 mg daily Chronic pain -> primary team managing  Hospital day # 0  I have personally obtained history,examined this patient, reviewed notes, independently viewed imaging studies, participated in medical decision making and plan of care.ROS completed by me personally and pertinent positives fully documented  I have made any additions or clarifications directly to the above note. Agree with note above.  Patient presented with speech difficulties and left facial weakness with MRI showing right corpus callosum and small right occipital white matter infarcts which are likely from small vessel disease.  There is some documentation in the chart about patient going in and out of A-fib by EMS but no definite documentation of the rhythm strip for an EKG suggesting this hence recommend prolonged cardiac monitoring at discharge to look for paroxysmal A-fib.  Recommend dual antiplatelet therapy aspirin  Plavix  for 3 weeks followed by Plavix  alone aggressive risk factor modification.  Discussed with Dr.Swayze   I personally spent a total of 50 minutes in the care of the patient today including getting/reviewing separately obtained history, performing a medically appropriate exam/evaluation, counseling and educating, placing orders, referring and communicating with other health care professionals, documenting clinical information in the EHR, independently interpreting results, and coordinating care.        Eather Popp, MD Medical Director [facility] Pager: [phone] 01/28/2024 4:20 PM  To contact Stroke Continuity provider, please refer to [email or web address]. After hours, contact  General Neurology

## 2024-01-28 NOTE — Progress Notes (Signed)
 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 He presented to [facility] ED with complaints of left sided facial droop and slurred speech that started at 1400 on 01/26/2024. He presented at 5:59 on 01/27/2024. He was outside the window for TPA. EMS reported that he had gone in and out of atrial fibrillation.   In the ED he was found to have left sided facial droop. CT head demonstrated no acute intracranial abnormality. CT cervical spine without contrast demonstrated advanced multilevel degenerative changes. No acute bony abnormality.MRI demonstrated small acute right pericallosal frontal lobe infarct and punctate acute or subacute infarct in the right occipital lobe. EKG in the ED reported sinus rhythm.   The patient was admitted to a telemetry bed by my colleague, Dr. Maree.  Neurology was consulted. Dr Voncile has been notified that the patient has CVA on MRI and that patient had atrial fibrillation intermittently as per EMS. Recommendations thus far were for plavix , aspirin , and lipitor. Lisinopril  has been held for permissive hypertension.  The patient has been persistently hyperglycemic this afternoon, but he has not eaten yet. D 50 given and nurse instructed to feed the patient.  Question if the patient should be on anticoagulation vs DAPT and if he should be discharged on cardiac monitor has been put before Dr. Voncile.

## 2024-01-28 NOTE — Plan of Care (Signed)
  Problem: Education: Goal: Knowledge of disease or condition will improve Outcome: Progressing Goal: Knowledge of secondary prevention will improve (MUST DOCUMENT ALL) Outcome: Progressing Goal: Knowledge of patient specific risk factors will improve (DELETE if not current risk factor) Outcome: Progressing   Problem: Ischemic Stroke/TIA Tissue Perfusion: Goal: Complications of ischemic stroke/TIA will be minimized Outcome: Progressing

## 2024-01-28 NOTE — ED Notes (Signed)
 Pt to floor with transport. Floor aware.

## 2024-01-28 NOTE — ED Notes (Signed)
 Patient transported to MRI

## 2024-01-28 NOTE — ED Notes (Signed)
 Pt incontinent of stool again, stool is green and liquid. Pt cleaned and brief changed, heading to MRI now.

## 2024-01-29 ENCOUNTER — Inpatient Hospital Stay (HOSPITAL_COMMUNITY)

## 2024-01-29 DIAGNOSIS — N189 Chronic kidney disease, unspecified: Secondary | ICD-10-CM

## 2024-01-29 DIAGNOSIS — I4891 Unspecified atrial fibrillation: Secondary | ICD-10-CM

## 2024-01-29 DIAGNOSIS — G894 Chronic pain syndrome: Secondary | ICD-10-CM | POA: Diagnosis not present

## 2024-01-29 DIAGNOSIS — E785 Hyperlipidemia, unspecified: Secondary | ICD-10-CM

## 2024-01-29 DIAGNOSIS — F17210 Nicotine dependence, cigarettes, uncomplicated: Secondary | ICD-10-CM

## 2024-01-29 DIAGNOSIS — I739 Peripheral vascular disease, unspecified: Secondary | ICD-10-CM

## 2024-01-29 DIAGNOSIS — F1910 Other psychoactive substance abuse, uncomplicated: Secondary | ICD-10-CM

## 2024-01-29 DIAGNOSIS — I639 Cerebral infarction, unspecified: Secondary | ICD-10-CM | POA: Diagnosis not present

## 2024-01-29 DIAGNOSIS — E876 Hypokalemia: Secondary | ICD-10-CM

## 2024-01-29 LAB — URINALYSIS, ROUTINE W REFLEX MICROSCOPIC
Bilirubin Urine: NEGATIVE
Glucose, UA: NEGATIVE mg/dL
Ketones, ur: NEGATIVE mg/dL
Leukocytes,Ua: NEGATIVE
Nitrite: POSITIVE — AB
Protein, ur: NEGATIVE mg/dL
Specific Gravity, Urine: 1.005 (ref 1.005–1.030)
pH: 5 (ref 5.0–8.0)

## 2024-01-29 LAB — CBC
HCT: 35.4 % — ABNORMAL LOW (ref 39.0–52.0)
Hemoglobin: 12.5 g/dL — ABNORMAL LOW (ref 13.0–17.0)
MCH: 34.9 pg — ABNORMAL HIGH (ref 26.0–34.0)
MCHC: 35.3 g/dL (ref 30.0–36.0)
MCV: 98.9 fL (ref 80.0–100.0)
Platelets: 130 K/uL — ABNORMAL LOW (ref 150–400)
RBC: 3.58 MIL/uL — ABNORMAL LOW (ref 4.22–5.81)
RDW: 14.9 % (ref 11.5–15.5)
WBC: 21.6 K/uL — ABNORMAL HIGH (ref 4.0–10.5)
nRBC: 0 % (ref 0.0–0.2)

## 2024-01-29 LAB — BASIC METABOLIC PANEL WITH GFR
Anion gap: 14 (ref 5–15)
BUN: 12 mg/dL (ref 8–23)
CO2: 22 mmol/L (ref 22–32)
Calcium: 8.5 mg/dL — ABNORMAL LOW (ref 8.9–10.3)
Chloride: 106 mmol/L (ref 98–111)
Creatinine, Ser: 1.7 mg/dL — ABNORMAL HIGH (ref 0.61–1.24)
GFR, Estimated: 43 mL/min — ABNORMAL LOW (ref >60)
Glucose, Bld: 106 mg/dL — ABNORMAL HIGH (ref 70–99)
Potassium: 2.4 mmol/L — CL (ref 3.5–5.1)
Sodium: 142 mmol/L (ref 135–145)

## 2024-01-29 LAB — GLUCOSE, CAPILLARY
Glucose-Capillary: 58 mg/dL — ABNORMAL LOW (ref 70–99)
Glucose-Capillary: 92 mg/dL (ref 70–99)
Glucose-Capillary: 76 mg/dL (ref 70–99)
Glucose-Capillary: 87 mg/dL (ref 70–99)
Glucose-Capillary: 93 mg/dL (ref 70–99)

## 2024-01-29 LAB — C DIFFICILE QUICK SCREEN W PCR REFLEX
C Diff antigen: NEGATIVE
C Diff interpretation: NOT DETECTED
C Diff toxin: NEGATIVE

## 2024-01-29 LAB — RAPID URINE DRUG SCREEN, HOSP PERFORMED
Amphetamines: NOT DETECTED
Barbiturates: NOT DETECTED
Benzodiazepines: NOT DETECTED
Cocaine: NOT DETECTED
Opiates: NOT DETECTED
Tetrahydrocannabinol: NOT DETECTED

## 2024-01-29 LAB — PHOSPHORUS
Phosphorus: 1.8 mg/dL — ABNORMAL LOW (ref 2.5–4.6)
Phosphorus: 1.6 mg/dL — ABNORMAL LOW (ref 2.5–4.6)

## 2024-01-29 LAB — PROCALCITONIN: Procalcitonin: 0.16 ng/mL

## 2024-01-29 LAB — MAGNESIUM
Magnesium: 1.7 mg/dL (ref 1.7–2.4)
Magnesium: 2 mg/dL (ref 1.7–2.4)

## 2024-01-29 MED ORDER — K PHOS MONO-SOD PHOS DI & MONO 155-852-130 MG PO TABS
500.0000 mg | ORAL_TABLET | Freq: Four times a day (QID) | ORAL | Status: AC
  Administered 2024-01-29 – 2024-01-30 (×6): 500 mg via ORAL
  Filled 2024-01-29 (×6): qty 2

## 2024-01-29 MED ORDER — MAGNESIUM SULFATE 2 GM/50ML IV SOLN
2.0000 g | Freq: Once | INTRAVENOUS | Status: AC
  Administered 2024-01-29: 2 g via INTRAVENOUS
  Filled 2024-01-29: qty 50

## 2024-01-29 MED ORDER — POTASSIUM CHLORIDE CRYS ER 20 MEQ PO TBCR
20.0000 meq | EXTENDED_RELEASE_TABLET | Freq: Every day | ORAL | Status: DC
  Administered 2024-01-30 – 2024-01-31 (×2): 20 meq via ORAL
  Filled 2024-01-29 (×2): qty 1

## 2024-01-29 MED ORDER — POTASSIUM CHLORIDE CRYS ER 20 MEQ PO TBCR
40.0000 meq | EXTENDED_RELEASE_TABLET | Freq: Four times a day (QID) | ORAL | Status: AC
Start: 2024-01-29 — End: 2024-01-29
  Administered 2024-01-29 (×2): 40 meq via ORAL
  Filled 2024-01-29 (×2): qty 2

## 2024-01-29 MED ORDER — POTASSIUM CHLORIDE 10 MEQ/100ML IV SOLN
10.0000 meq | INTRAVENOUS | Status: AC
Start: 2024-01-29 — End: 2024-01-29
  Administered 2024-01-29 (×6): 10 meq via INTRAVENOUS
  Filled 2024-01-29 (×5): qty 100

## 2024-01-29 NOTE — Plan of Care (Signed)

## 2024-01-29 NOTE — Progress Notes (Addendum)
 Initial Nutrition Assessment  DOCUMENTATION CODES:   Severe malnutrition in context of chronic illness  INTERVENTION:  Add Ensure Plus High Protein po BID, each supplement provides 350 kcal and 20 grams of protein  Add Magic cup TID with meals, each supplement provides 290 kcal and 9 grams of protein  Collect new weight to assess trend Assistance with meal ordering Liberalize diet to encourage intake Continue MVI w/ minerals/ folic acid / thiamine    NUTRITION DIAGNOSIS:  Severe Malnutrition related to chronic illness as evidenced by severe muscle depletion, severe fat depletion.   GOAL:  Patient will meet greater than or equal to 90% of their needs  MONITOR:  PO intake, Supplement acceptance  REASON FOR ASSESSMENT:  Consult Assessment of nutrition requirement/status  ASSESSMENT:   Pt with PMH significant for: HTN, CKD, EtOH use disorder, T2DM, depression, chronic pain and dyslipidemia. Presented to ED w/ left sided facial droop. Admitted for electrolyte derangement, AKI, and acute CVA. Of note, endorses diarrhea x5 days leading up to admission.  CIR sign off this morning as patient with no reliable caregiver support on discharge. Sister lives in [place] and cannot provide, at present. SNF placement being pursued. Patient on contact for potential C.Diff as WBCs trending up. Electrolyte derangement being corrected. Likelt d/t malnutrition and dehydration 2/2 reproted diarrhea x5 days PTA.  Average Meal Intake No documentation to review  He lives alone and endorses cooking for himself and eating three meals per day. As evidenced by his severe muscle and fat depletions, he seems to be poor historian, as he also reports using marijuana to aid in increasing appetite. Will order liberalized diet and assistance with meal ordering in an effort to maximize PO intake.  24 Hour Recall B: grits, cheese, toast, eggs L: deli sandwich w/ lettuce, tomato, onion and chips D: protein and  vegetable  Difficult to keep engaged during interaction often drifting off to sleep. Adequate dentition observed with no difficulties chewing or swallowing reported or documented.  Discussed importance of consuming adequate protein and calories to prevent skin breakdown and promote weight gain and preservation of lean body mass.   Admit/Current Weight: 67.1 kg - no new weight this admission  Patient endorses UBW around 160lbs but can't recall how long ago he last weighed this. Needs new weight collected as the last five weights in the chart have been pulled forward and span back one year. Will order new weight collection to assess trend.   Meds: folic acid , SSI Novolog  0-5 QHS, MVI, IV KCl, thiamine    Creatinine trending down with rehydration. Potassium remains low. Repletion ongoing. Potassium, magnesium , and phosphorus all low on admission. WBCs trending up.   Labs:  Na+ 142 (wdl) K+ 2.4 (L) Mg 1.3>1.7 (wdl) PHOS 1.8 (L) Crt 1.93>1.82>1.70 (H) WBC 9.8>14.0>21.6 (H) CBGs 106-143 x24 hours A1c 5.1 (01/2024)    NUTRITION - FOCUSED PHYSICAL EXAM:  Pt meets criteria for severe malnutrition in the context of chronic illness 2/2 his severe muscle and fat depletions. While he meets criteria due to his underlying chronic comorbidities, there are likely some social/environmental factors contributing to his malnourished state as well.   Flowsheet Row Most Recent Value  Orbital Region Moderate depletion  Upper Arm Region Severe depletion  Thoracic and Lumbar Region Severe depletion  Buccal Region Moderate depletion  Temple Region Severe depletion  Clavicle Bone Region Severe depletion  Clavicle and Acromion Bone Region Severe depletion  Scapular Bone Region Severe depletion  Dorsal Hand Severe depletion  Patellar Region Severe depletion  Anterior Thigh Region Severe depletion  Posterior Calf Region Severe depletion  Edema (RD Assessment) None  Hair Reviewed  Eyes Reviewed  Mouth  Reviewed  Skin Reviewed  Nails Reviewed   Diet Order:   Diet Order             Diet heart healthy/carb modified Room service appropriate? Yes; Fluid consistency: Thin  Diet effective now            EDUCATION NEEDS:   Education needs have been addressed  Skin:  Skin Assessment: Reviewed RN Assessment  Last BM:  9/9 - type 7 x2  Height:  Ht Readings from Last 1 Encounters:  12/11/23 6' 2 (1.88 m)   Weight:  Wt Readings from Last 1 Encounters:  12/11/23 67.1 kg   Ideal Body Weight:  86.4 kg  BMI:  There is no height or weight on file to calculate BMI.  Estimated Nutritional Needs:   Kcal:  1800-2000 kcals  Protein:  80-100g  Fluid:  1.8-2L/day  Blair Deaner MS, RD, LDN Registered Dietitian Clinical Nutrition RD Inpatient Contact Info in Amion

## 2024-01-29 NOTE — Progress Notes (Signed)
 Inpatient Rehab Coordinator Note:  I met with patient at bedside to discuss CIR recommendations and goals/expectations of CIR stay.  We reviewed 3 hrs/day of therapy, physician follow up, and average length of stay 2 weeks (dependent upon progress) with goals of supervision to min assist.  He states that he has neighbors top, bottom, and side to side but unclear whether that would be reliable support for discharge from CIR.  I called his sister, Elyn, who is in [facility] and currently in rehab herself.  She is not confident that he has 24/7 caregiver support at discharge, which I think he would certainly need.  She prefers for him to d/c to SNF for prolonged recovery.  I will notify TOC and sign off.   Reche Lowers, PT, DPT Admissions Coordinator [phone] 01/29/24  12:18 PM

## 2024-01-29 NOTE — Progress Notes (Signed)
 Critical potassium after replacement, MD notified, will place orders

## 2024-01-29 NOTE — TOC Progression Note (Addendum)
 Transition of Care [facility]) - Progression Note    Patient Details  Name: Dennis Zhang MRN: [id number] Date of Birth: 11-05-1955  [facility]) CM/SW Contact  Inocente GORMAN Kindle, LCSW Phone Number: 01/29/2024, 2:05 PM  Clinical Narrative:    2pm-CSW received consult for possible SNF placement at time of discharge at request of patient's sister due to lack of caregiver support for CIR. CSW will send out SNF referrals for review and provide bed offers as available.   3:11 PM-CSW attempted to contact patient's sister to go over SNF bed offers but no voicemail available.     Skilled Nursing Rehab Facilities-   [email or web address]   Ratings out of 5 stars (5 the highest)  Name Address  Phone # Quality Care Staffing Health Inspection Overall  [facility] [address] [place] [phone] 2 1 2 1   [facility] [address] [place] [phone] 5 2 4 5   [facility] [address], [place] [phone] 2 1 1 1   [facility] [address] [place] [phone] 4 3 4 4   [facility]  [address], [place] [phone] 4 3 5 5   [facility] [address] [facility] [phone] 5 3 2 3   [facility] [address] [place] [phone] 5 1 2 2   [facility] [phone] [address], [place] [phone] 3 4 4 4   [address] ([place]) [address] [place] [phone] [facility] [address] [place] [place], [place] [phone] 4 2 2 2   [facility] ([place]) [address], [place] [phone] 2 1 1 1   Clotilda Pereyra [address] [place] [phone] 2 4 4 4   [facility] [address], [place] [phone] 3 1 3 2   [facility] ([facility]) [address], [place] [phone] 1 2 4 3           [facility] [address] [place] [phone] 3 1 5 4   [facility] [address],  [place] [phone] 4 2 1 1   [facility]  [address], [place] [phone] 2 4 1 1   [facility]  [address] [phone] 2 2 5 5   [facility] [address], [place] [phone] 2 2 3 3           [facility] [address] [place] [phone] 2 1 2 1   [facility] (No [facility]) [address], [place] [phone] 4 3 4 4   [facility] [address] [facility] [phone] 3  5 5   [facility] [address], [place] [phone] 4 2 1 1   [facility] [address] [place] [facility] [phone] 3 1 2 1   [facility] [address] [place] [phone] 1 3 3 2   [facility] [address] [place] [phone] 2 2 3 3   [facility] [address] [place], [place] [phone] 2 1 4 3   [facility] [address], [facility] [phone] 2 1 1 1   [facility] [address] [place], [place] [phone] 2 1 2 1   [facility] [address] [place] [phone] 3 1 2 1           [facility] [address] [place] [phone] 4 1 3 2   [facility] [address] [place]  [phone] 2 4 4 4   [facility] (No [employer]) [address], [place] [phone] 3 2 5  5  [facility]) [address], [place] [phone] 3 2 3 3   [facility] [address], [place] [phone] 5 3 5 5   [facility] [address], [place] [phone] [facility] [address] [place], [place] [phone] 3 5 5 5           [facility] [address] [place], [place] [phone] 5 4 5 5   [facility])  [address], [place] [phone] 1 1 2 1   [facility]) [address], [place] [phone]  2 4 4   [facility] [address], [place] [phone] 3 5 5 5   [address] [place], [place] [phone] 4 2 2 2   [facility]) [address] [place] [phone] 1 1 3 1   [facility] [address], [place] [phone] 2 2 2 2       Expected Discharge Plan: Skilled Nursing Facility Barriers to Discharge: Continued Medical Work up, English as a second language teacher, SNF Pending bed offer               Expected Discharge Plan and Services In-house Referral: Clinical Social Work   Post Acute Care Choice: Skilled Nursing Facility Living arrangements for the past 2 months: Apartment                                       Social Drivers of Health (SDOH) Interventions SDOH Screenings   Food Insecurity: No Food Insecurity (12/11/2023)  Housing: High Risk (12/11/2023)  Transportation Needs: No Transportation Needs (12/11/2023)  Utilities: Not At Risk (12/11/2023)  Depression (PHQ2-9): Medium Risk (09/28/2020)  Social Connections: Moderately Isolated (12/11/2023)  Tobacco Use: High Risk (01/27/2024)    Readmission Risk Interventions     No data to display

## 2024-01-29 NOTE — Consult Note (Signed)
 Physical Medicine and Rehabilitation Consult Reason for Consult:Rehab Referring Physician: Dr. Sherlon   HPI: Maaz Zacharia is a 68 y.o. male with PMH of diabetes mellitus, chronic pain, substance abuse, depression, dyslipidemia, hypertension who presented to the hospital 9/7 for left-sided facial droop and slurred speech.  CT head no acute abnormality.  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  MRA with no significant stenosis.  Neurology planning aspirin  81 mg daily and clopidogrel  75 mg daily for 3 weeks and then clopidogrel  alone.  Patient noted to have severe electrolyte abnormalities felt to be in the setting of malnutrition and chronic diarrhea.    Patient reports that he has pain all over his body, wants Percocet.  [facility].  Suspicion for A-fib due to report from EMS.  Patient seen by PT, OT and speech therapy, found to have functional deficits.  Per chart review patient lives by himself in a 1 level home with level entry.  He has a cane and rollator at home.    Home: Home Living Family/patient expects to be discharged to:: Private residence Living Arrangements: Alone Type of Home: Apartment Home Access: Level entry Home Layout: One level Bathroom Shower/Tub: Engineer, manufacturing systems: Handicapped height Home Equipment: Medical laboratory scientific officer - single point, Information systems manager, Grab bars - tub/shower, Grab bars - toilet, Wheelchair - manual, Rollator (4 wheels) Additional Comments: Pt is supposed to get an aide on sept. 12th (he states he/she can come 7 days/wk)  Lives With: Alone  Functional History: Prior Function Prior Level of Function : History of Falls (last six months) Mobility Comments: walks with a RW vs. SPC ADLs Comments: mod I for ADLs, manages his medications, does not drive Functional Status:  Mobility: Bed Mobility Overal bed  mobility: Needs Assistance Bed Mobility: Supine to Sit, Sit to Supine Supine to sit: Min assist Sit to supine: Mod assist Transfers Overall transfer level: Needs assistance Equipment used: Rolling walker (2 wheels) Transfers: Sit to/from Stand Sit to Stand: Min assist Ambulation/Gait Ambulation/Gait assistance: Mod assist, Contact guard assist Gait Distance (Feet): 150 Feet Assistive device: Rolling walker (2 wheels) Gait Pattern/deviations: Step-through pattern, Narrow base of support, Decreased step length - right, Decreased step length - left General Gait Details: Very short step length bil. Pt initially CGA with fatigue at ~125 ft pt began to tremor and required Mod A with poor management of AD due to tremors in the bil UE/LE. Pt states this happens at home and he locks his rollator and sits down. Pt was assisted into the RW and was able to ambulate 25 ft to bed at intermittent CGA to Mod A depending on tremor severity. Gait velocity: decreased Gait velocity interpretation: <1.31 ft/sec, indicative of household ambulator    ADL: ADL Overall ADL's : Needs assistance/impaired Eating/Feeding: Set up, Sitting Grooming: Set up, Sitting Upper Body Bathing: Minimal assistance, Sitting Lower Body Bathing: Maximal assistance, Sit to/from stand Upper Body Dressing : Minimal assistance, Sitting Lower Body Dressing: Maximal assistance, Sit to/from stand Toilet Transfer: Minimal assistance, Ambulation, Rolling walker (2 wheels) Toileting- Clothing Manipulation and Hygiene: Total assistance, Sit to/from stand Toileting - Clothing Manipulation Details (indicate cue type and reason): total A fro peri care in standing on eval Functional mobility during ADLs: Minimal assistance, Rolling walker (2 wheels) General ADL Comments: cues needed for safety. pt limited by fatigue and activity tolerance  Cognition:  Cognition Overall Cognitive Status: Impaired/Different from baseline Arousal/Alertness:   (sleepy/drowsy) Orientation Level: Oriented to person, Oriented to place, Disoriented to situation (disoriented to month, oriented to year) Year: 2025 Month: December Attention: Sustained Sustained Attention: Appears intact Memory: Impaired Memory Impairment: Retrieval deficit, Storage deficit (0/4 independent, 1/4 with choice) Awareness: Impaired Awareness Impairment: Intellectual impairment Problem Solving: Impaired Problem Solving Impairment: Verbal basic Safety/Judgment: Impaired Cognition Arousal: Alert Behavior During Therapy: Flat affect Overall Cognitive Status: Impaired/Different from baseline   Review of Systems  Constitutional:  Negative for chills and fever.  HENT:  Positive for hearing loss.   Eyes:  Positive for blurred vision. Negative for double vision.       Altered vision  Respiratory:  Negative for cough and shortness of breath.   Cardiovascular:  Negative for chest pain.  Gastrointestinal:  Positive for abdominal pain and diarrhea. Negative for constipation, nausea and vomiting.  Musculoskeletal:  Positive for back pain, joint pain and neck pain.  Skin:  Negative for rash.  Neurological:  Positive for sensory change and focal weakness. Negative for tingling.   Past Medical History:  Diagnosis Date   Arthritis    Chronic pain    Depression    Diabetes mellitus without complication (HCC)    borderline   Hepatitis    Hyperlipidemia    Hypertension    Seizures (HCC)    Past Surgical History:  Procedure Laterality Date   ABDOMINAL SURGERY     APPENDECTOMY     CIRCUMCISION N/A 12/11/2023   Procedure: CIRCUMCISION, ADULT;  Surgeon: Elisabeth Valli BIRCH, MD;  Location: [facility];  Service: Urology;  Laterality: N/A;  CIRCUMCISION, EXCISIONAL PENILE BIOPSY   CYST EXCISION     HEMORROIDECTOMY     PENILE BIOPSY N/A 12/11/2023   Procedure: BIOPSY, PENIS;  Surgeon: Elisabeth Valli BIRCH, MD;  Location: [facility];  Service: Urology;  Laterality: N/A;   Family History   Problem Relation Age of Onset   Hypertension Mother    Migraines Sister    Heart failure Brother    Migraines Brother    Social History:  reports that he has been smoking cigarettes. He has a 15 pack-year smoking history. He has never used smokeless tobacco. He reports current alcohol  use. He reports that he does not use drugs. Allergies:  Allergies  Allergen Reactions   Ace Inhibitors     Kidney injury 05/2014-do not Rx   Aspirin  Nausea Only and Other (See Comments)    Reaction to Bayer aspirin  - causes acid reflux and nausea   Metformin Other (See Comments)    Other reaction(s): Abdominal pain   Methocarbamol Nausea And Vomiting   Tylenol  [Acetaminophen ] Nausea And Vomiting    States can take Tylenol  if has other pain med w/it - like Hydrocodone     Medications Prior to Admission  Medication Sig Dispense Refill   albuterol  (VENTOLIN  HFA) 108 (90 Base) MCG/ACT inhaler Inhale 2 puffs into the lungs every 6 (six) hours as needed for wheezing or shortness of breath.     artificial tears ophthalmic solution Place 1 drop into both eyes as needed for dry eyes.     aspirin  EC 81 MG tablet Take 81 mg by mouth daily. Swallow whole.     atorvastatin  (LIPITOR) 10 MG tablet Take 10 mg by mouth daily.     Cholecalciferol 50 MCG (2000 UT) TABS Take 2,000 Units by mouth every morning.     DULoxetine  (CYMBALTA ) 30 MG capsule Take 30 mg by mouth daily. For  pain and mood stabilization     feeding supplement, ENSURE COMPLETE, (ENSURE COMPLETE) LIQD Take 237 mLs by mouth 3 (three) times daily between meals.     folic acid  (FOLVITE ) 1 MG tablet Take 1 tablet (1 mg total) by mouth daily. 30 tablet 0   gabapentin  (NEURONTIN ) 300 MG capsule Take 1 capsule (300 mg total) by mouth 3 (three) times daily. (Patient taking differently: Take 300 mg by mouth 4 (four) times daily as needed (for nerve pain.).) 45 capsule 0   latanoprost  (XALATAN ) 0.005 % ophthalmic solution Place 1 drop into both eyes at bedtime.      lisinopril  (ZESTRIL ) 2.5 MG tablet Take by mouth.     Magnesium  Oxide 420 MG TABS Take 420 mg by mouth daily.      Multiple Vitamin (MULTIVITAMIN WITH MINERALS) TABS tablet Take 1 tablet by mouth daily.     potassium chloride  SA (KLOR-CON  M) 20 MEQ tablet Take 20 mEq by mouth daily after breakfast.     sildenafil (VIAGRA) 100 MG tablet Take 50 mg by mouth daily as needed for erectile dysfunction.      tamsulosin  (FLOMAX ) 0.4 MG CAPS capsule Take 1 capsule (0.4 mg total) by mouth daily after breakfast. 30 capsule 0   thiamine  100 MG tablet Take 1 tablet (100 mg total) by mouth daily. 30 tablet 0     Blood pressure (!) 149/86, pulse 85, temperature 97.6 F (36.4 C), temperature source Oral, resp. rate 15, SpO2 98%. Physical Exam  General: No apparent distress HEENT: Head is normocephalic, atraumatic Heart: Reg rate and rhythm. No murmurs rubs or gallops Chest: CTA bilaterally  Abdomen: Soft, non-tender, non-distended, bowel sounds positive. Extremities: No clubbing, cyanosis, or edema. Pulses are 2+ Psych: Pt's affect is appropriate. Pt is cooperative Skin: Clean and intact without signs of breakdown Neuro:    Mental Status: AAO to month , not day or year, delayed responses, often tangential, focusing on his pain and Percocet Oriented to person and hospital and situation Speech/Languate: Naming and repetition intact,mild dysarthria CRANIAL NERVES: II: PERRL. III, IV, VI: EOM intact, no gaze preference or deviation V: Altered left face VII: Left facial weakness VIII: normal hearing to speech IX, X: normal palatal elevation XI: Intact XII: Tongue midline   MOTOR: RUE: 4/5 Deltoid, 4/5 Biceps, 4/5 Triceps,4/5 Grip LUE: 4-/5 Deltoid, 4-/5 Biceps, 4-/5 Triceps, 4-/5 Grip RLE: HF 4/5, KE 4/5, ADF 4/5, APF4/5 LLE: HF 3/5, KE4-/5, ADF 4-/5, APF 4-/5  SENSORY: Reports altered sensation on the left side of his body    Results for orders placed or performed during the hospital  encounter of 01/27/24 (from the past 24 hours)  Glucose, capillary     Status: Abnormal   Collection Time: 01/28/24 12:35 PM  Result Value Ref Range   Glucose-Capillary 63 (L) 70 - 99 mg/dL   Comment 1 Notify RN   Glucose, capillary     Status: Abnormal   Collection Time: 01/28/24  1:00 PM  Result Value Ref Range   Glucose-Capillary 62 (L) 70 - 99 mg/dL  Basic metabolic panel with GFR     Status: Abnormal   Collection Time: 01/28/24  3:09 PM  Result Value Ref Range   Sodium 139 135 - 145 mmol/L   Potassium 2.5 (LL) 3.5 - 5.1 mmol/L   Chloride 104 98 - 111 mmol/L   CO2 22 22 - 32 mmol/L   Glucose, Bld 255 (H) 70 - 99 mg/dL   BUN 13 8 - 23 mg/dL  Creatinine, Ser 1.93 (H) 0.61 - 1.24 mg/dL   Calcium  8.3 (L) 8.9 - 10.3 mg/dL   GFR, Estimated 37 (L) >60 mL/min   Anion gap 13 5 - 15  Glucose, capillary     Status: Abnormal   Collection Time: 01/28/24  5:24 PM  Result Value Ref Range   Glucose-Capillary 319 (H) 70 - 99 mg/dL  Glucose, capillary     Status: None   Collection Time: 01/28/24  9:10 PM  Result Value Ref Range   Glucose-Capillary 87 70 - 99 mg/dL  Basic metabolic panel with GFR     Status: Abnormal   Collection Time: 01/28/24  9:55 PM  Result Value Ref Range   Sodium 140 135 - 145 mmol/L   Potassium 2.4 (LL) 3.5 - 5.1 mmol/L   Chloride 105 98 - 111 mmol/L   CO2 24 22 - 32 mmol/L   Glucose, Bld 143 (H) 70 - 99 mg/dL   BUN 13 8 - 23 mg/dL   Creatinine, Ser 8.17 (H) 0.61 - 1.24 mg/dL   Calcium  8.3 (L) 8.9 - 10.3 mg/dL   GFR, Estimated 40 (L) >60 mL/min   Anion gap 11 5 - 15  Magnesium      Status: None   Collection Time: 01/29/24  5:48 AM  Result Value Ref Range   Magnesium  1.7 1.7 - 2.4 mg/dL  Basic metabolic panel     Status: Abnormal   Collection Time: 01/29/24  5:48 AM  Result Value Ref Range   Sodium 142 135 - 145 mmol/L   Potassium 2.4 (LL) 3.5 - 5.1 mmol/L   Chloride 106 98 - 111 mmol/L   CO2 22 22 - 32 mmol/L   Glucose, Bld 106 (H) 70 - 99 mg/dL   BUN  12 8 - 23 mg/dL   Creatinine, Ser 8.29 (H) 0.61 - 1.24 mg/dL   Calcium  8.5 (L) 8.9 - 10.3 mg/dL   GFR, Estimated 43 (L) >60 mL/min   Anion gap 14 5 - 15  CBC     Status: Abnormal   Collection Time: 01/29/24  5:48 AM  Result Value Ref Range   WBC 21.6 (H) 4.0 - 10.5 K/uL   RBC 3.58 (L) 4.22 - 5.81 MIL/uL   Hemoglobin 12.5 (L) 13.0 - 17.0 g/dL   HCT 64.5 (L) 60.9 - 47.9 %   MCV 98.9 80.0 - 100.0 fL   MCH 34.9 (H) 26.0 - 34.0 pg   MCHC 35.3 30.0 - 36.0 g/dL   RDW 85.0 88.4 - 84.4 %   Platelets 130 (L) 150 - 400 K/uL   nRBC 0.0 0.0 - 0.2 %  Phosphorus     Status: Abnormal   Collection Time: 01/29/24  5:48 AM  Result Value Ref Range   Phosphorus 1.8 (L) 2.5 - 4.6 mg/dL  Glucose, capillary     Status: Abnormal   Collection Time: 01/29/24  8:02 AM  Result Value Ref Range   Glucose-Capillary 58 (L) 70 - 99 mg/dL   Comment 1 Notify RN   Glucose, capillary     Status: None   Collection Time: 01/29/24  8:31 AM  Result Value Ref Range   Glucose-Capillary 92 70 - 99 mg/dL   DG Chest Port 1 View Result Date: 01/29/2024 CLINICAL DATA:  Leukocytosis. EXAM: PORTABLE CHEST 1 VIEW COMPARISON:  Chest CT 03/14/2023. Radiographs 03/14/2023 and 07/28/2017. FINDINGS: 0858 hours. The heart size and mediastinal contours are stable with aortic atherosclerosis. The lungs appear clear. There are underlying emphysematous changes  with overlying skin folds bilaterally. No evidence of pneumothorax or significant pleural effusion. The bones appear unchanged. IMPRESSION: No evidence of acute cardiopulmonary process. Emphysema. Electronically Signed   By: Elsie Perone M.D.   On: 01/29/2024 09:57   VAS US  CAROTID Result Date: 01/28/2024 Carotid Arterial Duplex Study Patient Name:  REMMY RIFFE  Date of Exam:   01/28/2024 Medical Rec #: [id number]        Accession #:    [id number] Date of Birth: 01-31-1956         Patient Gender: M Patient Age:   12 years Exam Location:  [facility] Procedure:      VAS US   CAROTID Referring Phys: ELIGIO LAV --------------------------------------------------------------------------------  Indications:  CVA. Risk Factors: Hypertension, hyperlipidemia, Diabetes, current smoker. Performing Technologist: Elmarie Lindau, RVT  Examination Guidelines: A complete evaluation includes B-mode imaging, spectral Doppler, color Doppler, and power Doppler as needed of all accessible portions of each vessel. Bilateral testing is considered an integral part of a complete examination. Limited examinations for reoccurring indications may be performed as noted.  Right Carotid Findings: +----------+-------+-------+--------+---------------------------------+--------+           PSV    EDV    StenosisPlaque Description               Comments           cm/s   cm/s                                                     +----------+-------+-------+--------+---------------------------------+--------+ CCA Prox  76     11                                                       +----------+-------+-------+--------+---------------------------------+--------+ CCA Distal60     14             irregular and heterogenous                +----------+-------+-------+--------+---------------------------------+--------+ ICA Prox  118    40     1-39%   irregular, calcific and                                                   heterogenous                              +----------+-------+-------+--------+---------------------------------+--------+ ICA Mid   126    41                                                       +----------+-------+-------+--------+---------------------------------+--------+ ICA Distal100    35                                                       +----------+-------+-------+--------+---------------------------------+--------+  ECA       81                                                               +----------+-------+-------+--------+---------------------------------+--------+ +----------+--------+-------+----------------+-------------------+           PSV cm/sEDV cmsDescribe        Arm Pressure (mmHG) +----------+--------+-------+----------------+-------------------+ Subclavian118            Multiphasic, WNL                    +----------+--------+-------+----------------+-------------------+ +---------+--------+--+--------+--+---------+ VertebralPSV cm/s73EDV cm/s16Antegrade +---------+--------+--+--------+--+---------+  Left Carotid Findings: +----------+--------+--------+--------+--------------------------+--------+           PSV cm/sEDV cm/sStenosisPlaque Description        Comments +----------+--------+--------+--------+--------------------------+--------+ CCA Prox  98      21                                                 +----------+--------+--------+--------+--------------------------+--------+ CCA Distal62      20              smooth and heterogenous            +----------+--------+--------+--------+--------------------------+--------+ ICA Prox  87      28              irregular and heterogenous         +----------+--------+--------+--------+--------------------------+--------+ ICA Mid   111     31                                                 +----------+--------+--------+--------+--------------------------+--------+ ICA Distal139     43                                                 +----------+--------+--------+--------+--------------------------+--------+ ECA       104                                                        +----------+--------+--------+--------+--------------------------+--------+ +----------+--------+--------+----------------+-------------------+           PSV cm/sEDV cm/sDescribe        Arm Pressure (mmHG) +----------+--------+--------+----------------+-------------------+ [id number]              Multiphasic, WNL                    +----------+--------+--------+----------------+-------------------+ +---------+--------+--+--------+--+---------+ VertebralPSV cm/s53EDV cm/s13Antegrade +---------+--------+--+--------+--+---------+   Summary: Right Carotid: Velocities in the right ICA are consistent with a 1-39% stenosis. Left Carotid: Velocities in the left ICA are consistent with a 1-39% stenosis. Vertebrals:  Bilateral vertebral arteries demonstrate antegrade flow. Subclavians: Normal flow hemodynamics were seen in bilateral subclavian              arteries. *See table(s)  above for measurements and observations.     Preliminary    ECHOCARDIOGRAM COMPLETE Result Date: 01/28/2024    ECHOCARDIOGRAM REPORT   Patient Name:   ERMIN PARISIEN Date of Exam: 01/28/2024 Medical Rec #:  [id number]       Height:       74.0 in Accession #:    [id number]      Weight:       148.0 lb Date of Birth:  [birth year]        BSA:          1.912 m Patient Age:    68 years        BP:           142/114 mmHg Patient Gender: M               HR:           96 bpm. Exam Location:  Inpatient Procedure: 2D Echo, Cardiac Doppler and Color Doppler (Both Spectral and Color            Flow Doppler were utilized during procedure). Indications:    CVA  History:        Patient has no prior history of Echocardiogram examinations.  Sonographer:    Therisa Crouch Referring Phys: [id number] ASHISH ARORA IMPRESSIONS  1. Left ventricular ejection fraction, by estimation, is 60 to 65%. The left ventricle has normal function. The left ventricle has no regional wall motion abnormalities. Left ventricular diastolic parameters were normal.  2. Right ventricular systolic function is normal. The right ventricular size is normal.  3. The mitral valve is abnormal. No evidence of mitral valve regurgitation. No evidence of mitral stenosis.  4. Sclerosis of non coronary cusp. The aortic valve is tricuspid. There is moderate calcification of the aortic valve. There  is mild thickening of the aortic valve. Aortic valve regurgitation is not visualized. Aortic valve sclerosis is present, with no  evidence of aortic valve stenosis.  5. The inferior vena cava is normal in size with greater than 50% respiratory variability, suggesting right atrial pressure of 3 mmHg. FINDINGS  Left Ventricle: Left ventricular ejection fraction, by estimation, is 60 to 65%. The left ventricle has normal function. The left ventricle has no regional wall motion abnormalities. Strain was performed and the global longitudinal strain is indeterminate. The left ventricular internal cavity size was normal in size. There is no left ventricular hypertrophy. Left ventricular diastolic parameters were normal. Right Ventricle: The right ventricular size is normal. No increase in right ventricular wall thickness. Right ventricular systolic function is normal. Left Atrium: Left atrial size was normal in size. Right Atrium: Right atrial size was normal in size. Pericardium: Trivial pericardial effusion is present. The pericardial effusion is posterior to the left ventricle. Mitral Valve: The mitral valve is abnormal. There is mild thickening of the mitral valve leaflet(s). No evidence of mitral valve regurgitation. No evidence of mitral valve stenosis. Tricuspid Valve: The tricuspid valve is normal in structure. Tricuspid valve regurgitation is mild . No evidence of tricuspid stenosis. Aortic Valve: Sclerosis of non coronary cusp. The aortic valve is tricuspid. There is moderate calcification of the aortic valve. There is mild thickening of the aortic valve. Aortic valve regurgitation is not visualized. Aortic valve sclerosis is present, with no evidence of aortic valve stenosis. Pulmonic Valve: The pulmonic valve was normal in structure. Pulmonic valve regurgitation is not visualized. No evidence of pulmonic stenosis. Aorta: The aortic root is normal in size and structure. Venous:  The inferior vena cava is normal  in size with greater than 50% respiratory variability, suggesting right atrial pressure of 3 mmHg. IAS/Shunts: No atrial level shunt detected by color flow Doppler. Additional Comments: 3D was performed not requiring image post processing on an independent workstation and was indeterminate.  LEFT VENTRICLE PLAX 2D LVIDd:         2.94 cm   Diastology LVIDs:         1.74 cm   LV e' medial:    9.25 cm/s LV PW:         1.04 cm   LV E/e' medial:  7.3 LV IVS:        1.06 cm   LV e' lateral:   9.57 cm/s LVOT diam:     2.14 cm   LV E/e' lateral: 7.1 LVOT Area:     3.60 cm  RIGHT VENTRICLE             IVC RV S prime:     12.30 cm/s  IVC diam: 1.07 cm TAPSE (M-mode): 1.8 cm LEFT ATRIUM             Index LA diam:        2.15 cm 1.12 cm/m LA Vol (A2C):   48.7 ml 25.47 ml/m LA Vol (A4C):   35.7 ml 18.67 ml/m LA Biplane Vol: 41.3 ml 21.60 ml/m   AORTA Ao Root diam: 3.48 cm Ao Asc diam:  3.44 cm MITRAL VALVE MV Area (PHT): 4.24 cm    SHUNTS MV Decel Time: 179 msec    Systemic Diam: 2.14 cm MV E velocity: 67.60 cm/s MV A velocity: 77.00 cm/s MV E/A ratio:  0.88 Maude Emmer MD Electronically signed by Maude Emmer MD Signature Date/Time: 01/28/2024/10:41:33 AM    Final    MR ANGIO HEAD WO CONTRAST Result Date: 01/28/2024 EXAM: MR Angiography Head without intravenous Contrast. 01/28/2024 05:00:53 AM TECHNIQUE: Magnetic resonance angiography images of the head without intravenous contrast. Multiplanar 2D and 3D reformatted images are provided for review. COMPARISON: None provided. CLINICAL HISTORY: Stroke/TIA, determine embolic source. FINDINGS: ANTERIOR CIRCULATION: No significant stenosis of the internal carotid arteries. No significant stenosis of the anterior cerebral arteries. No significant stenosis of the middle cerebral arteries. No aneurysm. 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 Fenestration of the basilar artery. No significant stenosis of the posterior cerebral arteries. No  significant stenosis of the basilar artery. No significant stenosis of the vertebral arteries. No aneurysm. IMPRESSION: 1. No significant stenosis of the intracranial vasculature. 2. 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 Electronically signed by: Evalene Coho MD 01/28/2024 05:14 AM EDT RP Workstation: [id number]   MR BRAIN WO CONTRAST Result Date: 01/28/2024 CLINICAL DATA:  Neuro deficit, acute, stroke suspected EXAM: MRI HEAD WITHOUT CONTRAST TECHNIQUE: Multiplanar, multiecho pulse sequences of the brain and surrounding structures were obtained without intravenous contrast. COMPARISON:  CT head 01/27/2024. 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 Mild edema. Punctate acute or early subacute infarct in the right occipital lobe. No mass effect. Patchy white matter T2 hyperintensities, compatible with chronic microvascular ischemic disease. Cerebral atrophy. No evidence of acute hemorrhage, mass lesion, less shift or hydrocephalus. Vascular: Normal flow voids. Skull and upper cervical spine: Normal marrow signal. Sinuses/Orbits: Negative. Other: No mastoid effusions. IMPRESSION: 1. Small acute right pericallosal frontal lobe infarct. 2. Punctate acute or subacute infarct in the right occipital lobe. Electronically Signed   By:  Gilmore GORMAN Molt M.D.   On: 01/28/2024 02:49   CT Cervical Spine Wo Contrast Result Date: 01/27/2024 CLINICAL DATA:  Neck trauma EXAM: CT CERVICAL SPINE WITHOUT CONTRAST TECHNIQUE: Multidetector CT imaging of the cervical spine was performed without intravenous contrast. Multiplanar CT image reconstructions were also generated. RADIATION DOSE REDUCTION: This exam was performed according to the departmental dose-optimization program which includes automated exposure control, adjustment of the mA and/or kV according to patient size and/or use of iterative reconstruction technique. COMPARISON:   08/03/2023 FINDINGS: Alignment: Normal Skull base and vertebrae: No acute fracture. No primary bone lesion or focal pathologic process. Soft tissues and spinal canal: No prevertebral fluid or swelling. No visible canal hematoma. Disc levels: Advanced multi level degenerative disc and facet disease. Upper chest: No acute findings.  Emphysema. Other: None IMPRESSION: Advanced multilevel degenerative changes. No acute bony abnormality. Emphysema. Electronically Signed   By: Franky Crease M.D.   On: 01/27/2024 22:17   CT HEAD WO CONTRAST Result Date: 01/27/2024 CLINICAL DATA:  Possible TIA EXAM: CT HEAD WITHOUT CONTRAST TECHNIQUE: Contiguous axial images were obtained from the base of the skull through the vertex without intravenous contrast. RADIATION DOSE REDUCTION: This exam was performed according to the departmental dose-optimization program which includes automated exposure control, adjustment of the mA and/or kV according to patient size and/or use of iterative reconstruction technique. COMPARISON:  08/03/2023 FINDINGS: Brain: There is atrophy and chronic small vessel disease changes. No acute intracranial abnormality. Specifically, no hemorrhage, hydrocephalus, mass lesion, acute infarction, or significant intracranial injury. Vascular: No hyperdense vessel or unexpected calcification. Skull: No acute calvarial abnormality. Sinuses/Orbits: No acute findings Other: None IMPRESSION: Atrophy, chronic microvascular disease. No acute intracranial abnormality. Electronically Signed   By: Franky Crease M.D.   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 Does the need for close, 24 hr/day medical supervision in concert with the patient's rehab needs make it unreasonable for this patient to be served in a less intensive setting? Yes Co-Morbidities requiring  supervision/potential complications:  - Tobacco abuse, substance abuse, depression, neuropathy, BPH, chronic pain, A-fib suspected, hypertension, acute on chronic kidney disease, hypophosphatemia, hypomagnesemia, hypokalemia Due to bladder management, bowel management, safety, skin/wound care, disease management, medication administration, pain management, and patient education, does the patient require 24 hr/day rehab nursing? Yes Does the patient require coordinated care of a physician, rehab nurse, therapy disciplines of PT OT and SLP to address physical and functional deficits in the context of the above medical diagnosis(es)? Yes Addressing deficits in the following areas: balance, endurance, locomotion, strength, transferring, bowel/bladder control, bathing, dressing, feeding, grooming, toileting, cognition, speech, language, swallowing, and psychosocial support Can the patient actively participate in an intensive therapy program of at least 3 hrs of therapy per day at least 5 days per week? Yes The potential for patient to make measurable gains while on inpatient rehab is good Anticipated functional outcomes upon discharge from inpatient rehab are supervision  with PT, supervision with OT, supervision with SLP. Estimated rehab length of stay to reach the above functional goals is: 10-12 Anticipated discharge destination: Home Overall Rehab/Functional Prognosis: good  POST ACUTE RECOMMENDATIONS: This patient's condition is appropriate for continued rehabilitative care in the following setting: CIR and SNF Patient has agreed to participate in recommended program. Yes Note that insurance prior authorization may be required for reimbursement for recommended care.  Comment: Patient would  likely benefit from SNF as he does not have significant caregiver support after discharge.   MEDICAL RECOMMENDATIONS: Could consider trying a neuropathic pain medication like gabapentin .   I have personally  performed a face to face diagnostic evaluation of this patient. Additionally, I have examined the patient's medical record including any pertinent labs and radiographic images.    Thanks,  Murray Collier, MD 01/29/2024

## 2024-01-29 NOTE — Progress Notes (Cosign Needed)
 STROKE TEAM PROGRESS NOTE    SIGNIFICANT HOSPITAL EVENTS 9/8: admitted   INTERIM HISTORY/SUBJECTIVE Patient is comfortable resting in bed.  No new neurologic events overnight.  We discussed a plan for cardiac monitoring outpatient to assess for any arrhythmias that could have contributed to patient's stroke.  OBJECTIVE  CBC    Component Value Date/Time   WBC 21.6 (H) 01/29/2024 0548   RBC 3.58 (L) 01/29/2024 0548   HGB 12.5 (L) 01/29/2024 0548   HCT 35.4 (L) 01/29/2024 0548   PLT 130 (L) 01/29/2024 0548   MCV 98.9 01/29/2024 0548   MCH 34.9 (H) 01/29/2024 0548   MCHC 35.3 01/29/2024 0548   RDW 14.9 01/29/2024 0548   LYMPHSABS 1.9 01/27/2024 1809   MONOABS 0.6 01/27/2024 1809   EOSABS 0.0 01/27/2024 1809   BASOSABS 0.0 01/27/2024 1809    BMET    Component Value Date/Time   NA 142 01/29/2024 0548   K 2.4 (LL) 01/29/2024 0548   CL 106 01/29/2024 0548   CO2 22 01/29/2024 0548   GLUCOSE 106 (H) 01/29/2024 0548   BUN 12 01/29/2024 0548   CREATININE 1.70 (H) 01/29/2024 0548   CALCIUM  8.5 (L) 01/29/2024 0548   GFRNONAA 43 (L) 01/29/2024 0548    IMAGING past 24 hours DG Chest Port 1 View Result Date: 01/29/2024 CLINICAL DATA:  Leukocytosis. EXAM: PORTABLE CHEST 1 VIEW COMPARISON:  Chest CT 03/14/2023. Radiographs 03/14/2023 and 07/28/2017. FINDINGS: 0858 hours. The heart size and mediastinal contours are stable with aortic atherosclerosis. The lungs appear clear. There are underlying emphysematous changes with overlying skin folds bilaterally. No evidence of pneumothorax or significant pleural effusion. The bones appear unchanged. IMPRESSION: No evidence of acute cardiopulmonary process. Emphysema. Electronically Signed   By: Elsie Perone M.D.   On: 01/29/2024 09:57    Vitals:   01/28/24 2006 01/29/24 0000 01/29/24 0402 01/29/24 0759  BP: (!) 169/96 (!) 144/88 (!) 149/86   Pulse:      Resp: (!) 24 17 15    Temp: 98.6 F (37 C) 98.6 F (37 C) 98.6 F (37 C) 97.6 F  (36.4 C)  TempSrc: Axillary Axillary Axillary Oral  SpO2:    98%    PHYSICAL EXAM General:  Alert, well-nourished, well-developed patient in no acute distress CV: Regular rate and rhythm on monitor Respiratory:  Regular, unlabored respirations on room air  NEURO:  Mental Status: AA&Ox3 Speech/Language: speech is with mild dysarthria.  Naming, repetition, fluency, and comprehension intact.  Cranial Nerves:  II: PERRL. Visual fields full.  III, IV, VI: EOMI. Eyelids elevate symmetrically.  V: Sensation is increased on L face  VII: Facial droop on L VIII: hearing intact to voice. IX, X: Palate elevates symmetrically. Phonation is normal.  KP:Dynloizm shrug 5/5. XII: tongue is midline without fasciculations. Motor: 5/5 strength to LLE and LUE. 4/5 strength RUE and RLE  Tone: is normal and bulk is normal Sensation- Increased on the L side of his body  Coordination: FTN intact bilaterally with ataxia on R, RUE and RLE 2 drift bilat. Gait- deferred  Most Recent NIH 2 @ 0402 9/9   ASSESSMENT/PLAN  Mr. Dennis Zhang is a 68 y.o. male with history of hypertension, hyperlipidemia, MDD, chronic pain, AUD, CKD admitted for dysarthria and facial droop.  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 Etiology likely embolic source  CT head with no acute abnormality. Atrophy and chronic microvascular disease present. MRI acute  right pericallosal frontal lobe infarct, subacute infarct in right occipital lobe MRA no significant stenosis of intracranial vasculature Carotid Doppler showed bilateral ICAs with stenosis of 1-39% 2D Echo EF 60-65%.  Abnormal mitral valve, moderate calcification of aortic valve with aortic valve sclerosis Cardiac monitoring to assess arrhythmias outpatient LDL 30 HgbA1c 5.1 VTE prophylaxis -heparin  aspirin  81 mg daily prior to admission, now on aspirin  81 mg daily and clopidogrel  75  mg daily for 3 weeks and then clopidogrel  75 mg alone. Therapy recommendations:  CIR Disposition: CIR when medically cleared by primary team  Hypertension Home meds: Holding lisinopril  2.5 mg Stable in 150-160/80-100s BP goal of normotension   Hyperlipidemia Home meds: Atorvastatin  10 mg, resumed in hospital LDL 30, goal < 70 High intensity statin not indicated Continue statin at discharge   AKI on CKDIIIa Primary team following Creatinine improving, baseline of 1.7 Continue IVF Continue holding lisinopril  and avoid nephrotoxic medications   Tobacco Abuse Patient smokes 1/2 packs per day for 30 years Nicotine  replacement therapy declined  Substance Abuse Patient with history of cocaine abuse, denies use of any illicit or recreational drugs recently UDS negative  Other Active Problems MDD -> 30 mg Cymbalta  daily Neuropathy -> continue gabapentin  300 mg 4 times daily BPH -> continue tamsulosin  0.4 mg daily Chronic pain -> primary team managing, continuing oxycodone  Malnutrition -> RD consulted Leukocytosis -> 21.6 today, primary team managing, pending workup  Hospital day # 1  Patient remains stable from neurologic perspective we will sign off at this time.  To contact Stroke Continuity provider, please refer to [email or web address]. After hours, contact General Neurology

## 2024-01-29 NOTE — Progress Notes (Signed)
 STROKE TEAM PROGRESS NOTE    SIGNIFICANT HOSPITAL EVENTS 9/8: admitted  INTERIM HISTORY/SUBJECTIVE Patient continues to have mild slurred speech and facial droop.  No new complaints.  No family at the bedside.  Vital signs stable.  OBJECTIVE  CBC    Component Value Date/Time   WBC 21.6 (H) 01/29/2024 0548   RBC 3.58 (L) 01/29/2024 0548   HGB 12.5 (L) 01/29/2024 0548   HCT 35.4 (L) 01/29/2024 0548   PLT 130 (L) 01/29/2024 0548   MCV 98.9 01/29/2024 0548   MCH 34.9 (H) 01/29/2024 0548   MCHC 35.3 01/29/2024 0548   RDW 14.9 01/29/2024 0548   LYMPHSABS 1.9 01/27/2024 1809   MONOABS 0.6 01/27/2024 1809   EOSABS 0.0 01/27/2024 1809   BASOSABS 0.0 01/27/2024 1809    BMET    Component Value Date/Time   NA 142 01/29/2024 0548   K 2.4 (LL) 01/29/2024 0548   CL 106 01/29/2024 0548   CO2 22 01/29/2024 0548   GLUCOSE 106 (H) 01/29/2024 0548   BUN 12 01/29/2024 0548   CREATININE 1.70 (H) 01/29/2024 0548   CALCIUM  8.5 (L) 01/29/2024 0548   GFRNONAA 43 (L) 01/29/2024 0548    IMAGING past 24 hours DG Chest Port 1 View Result Date: 01/29/2024 CLINICAL DATA:  Leukocytosis. EXAM: PORTABLE CHEST 1 VIEW COMPARISON:  Chest CT 03/14/2023. Radiographs 03/14/2023 and 07/28/2017. FINDINGS: 0858 hours. The heart size and mediastinal contours are stable with aortic atherosclerosis. The lungs appear clear. There are underlying emphysematous changes with overlying skin folds bilaterally. No evidence of pneumothorax or significant pleural effusion. The bones appear unchanged. IMPRESSION: No evidence of acute cardiopulmonary process. Emphysema. Electronically Signed   By: Elsie Perone M.D.   On: 01/29/2024 09:57    Vitals:   01/29/24 0000 01/29/24 0402 01/29/24 0759 01/29/24 1259  BP: (!) 144/88 (!) 149/86  (!) 140/96  Pulse:      Resp: 17 15    Temp: 98.6 F (37 C) 98.6 F (37 C) 97.6 F (36.4 C) 97.6 F (36.4 C)  TempSrc: Axillary Axillary Oral Oral  SpO2:   98% 98%   PHYSICAL  EXAM General:  Alert, well-nourished, well-developed patient in no acute distress CV: Regular rate and rhythm on monitor Respiratory:  Regular, unlabored respirations on room air  NEURO:  Mental Status: AA&Ox3, however minimally cooperative with the remainder of neurological exam Speech/Language: speech is with mild dysarthria.  Naming, repetition, fluency, and comprehension intact.  Cranial Nerves:  II: PERRL. Visual fields full.  III, IV, VI: EOMI. Eyelids elevate symmetrically.  V: Sensation is increased on left side of the face VII: Facial droop on L VIII: hearing intact to voice. IX, X: Palate elevates symmetrically. Phonation is normal.  KP:Dynloizm shrug 5/5. XII: tongue is midline without fasciculations. Motor: 5/5 strength to LLE and LUE. 4/5 strength RUE and RLE Tone: is normal and bulk is normal Sensation-increased on the left side of the body, both upper and lower extremities Coordination: FTN intact bilaterally with ataxia on the right, RUE and RLE drift present Gait- deferred  Most Recent NIH 1 @ 1030 9/8    ASSESSMENT/PLAN  Mr. Dennis Zhang is a 68 y.o. male with history of hypertension, hyperlipidemia, MDD, chronic pain, AUD, CKD admitted for dysarthria and facial droop.  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 Etiology likely embolic source CT head with no acute abnormality. Atrophy and chronic microvascular disease present. MRI acute  right pericallosal frontal lobe infarct, subacute infarct in right occipital lobe MRA no significant stenosis of intracranial vasculature Carotid Doppler showed bilateral ICAs with stenosis of 1-39% 2D Echo EF 60-65%.  Abnormal mitral valve, moderate calcification of aortic valve with aortic valve sclerosis LDL 30 HgbA1c 5.1 VTE prophylaxis -heparin  aspirin  81 mg daily prior to admission, now on aspirin  81 mg daily and clopidogrel  75 mg daily  for 3 weeks and then clopidogrel  75 mg alone. Therapy recommendations:  Pending Disposition: Pending workup  Atrial fibrillation High suspicion given EMS report EKG with normal sinus rhythm Continue telemetry monitoring Consider outpatient cardiac monitoring  Hypertension Home meds: Holding lisinopril  2.5 mg Stable in 150-160/100s BP goal of normotension, allow permissive HTN today  Hyperlipidemia Home meds: Atorvastatin  10 mg, resumed in hospital LDL 30, goal < 70 High intensity statin not indicated Continue statin at discharge  AKI, CKD, hypokalemia Potassium repleted Primary team following  Tobacco Abuse Patient smokes 1/2 packs per day for 30 years Nicotine  replacement therapy declined  Substance Abuse Patient with history of cocaine abuse, denies use of any illicit or recreational drugs recently UDS pending  Other Active Problems MDD -> 30 mg Cymbalta  daily Neuropathy -> continue gabapentin  300 mg 4 times daily BPH -> continue tamsulosin  0.4 mg daily Chronic pain -> primary team managing  Hospital day # 1 Patient presented with speech difficulties and left facial weakness with MRI showing right corpus callosum and small right occipital white matter infarcts which are likely from small vessel disease.  There is some documentation in the chart about patient going in and out of A-fib by EMS but no definite documentation of the rhythm strip for an EKG suggesting this hence recommend 30-day heart monitoring at discharge to look for paroxysmal A-fib.  Recommend dual antiplatelet therapy aspirin  Plavix  for 3 weeks followed by Plavix  alone aggressive risk factor modification.  Discussed with Dr Sherlon.  Stroke team will sign off.  Kindly call for questions I personally spent a total of 35 minutes in the care of the patient today including getting/reviewing separately obtained history, performing a medically appropriate exam/evaluation, counseling and educating, placing  orders, referring and communicating with other health care professionals, documenting clinical information in the EHR, independently interpreting results, and coordinating care.        Eather Popp, MD Medical Director [facility] Pager: [phone] 01/29/2024 1:19 PM  To contact Stroke Continuity provider, please refer to [email or web address]. After hours, contact General Neurology

## 2024-01-29 NOTE — Progress Notes (Signed)
 PT Cancellation Note  Patient Details Name: Dennis Zhang MRN: [id number] DOB: Jan 15, 1956   Cancelled Treatment:    Reason Eval/Treat Not Completed: Patient declined, no reason specified (Pt declined stating that he would like pain meds first before working with physical therapy. Will try again later if time allows.)   [name] 01/29/2024, 2:48 PM

## 2024-01-29 NOTE — Progress Notes (Addendum)
 PROGRESS NOTE    Dennis Zhang  FMW:[id number] DOB: 09-Jun-1955 DOA: 01/27/2024 PCP: [facility]    Chief Complaint  Patient presents with   stroke like symptoms     Brief Narrative:   Dennis Zhang is a 68 y.o. 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  EMS reported that patient was having some tachycardia and appeared to be atrial fibrillation at that time.  He is noted to have some diarrhea over the last 5 days or so with no nausea or vomiting.  He states as a result of the diarrhea he has been feeling quite weak.  He denies any fevers or chills.  NAD his workup significant for severe electrolyte derangement, AKI, and acute CVA, he is admitted for further workup.   Assessment & Plan:   Principal Problem:   CVA (cerebral vascular accident) (HCC) Active Problems:   Hypokalemia   Malnutrition of moderate degree (HCC)   Acute kidney injury (HCC)   Seizure (HCC)   Acute CVA (cerebrovascular accident) (HCC)      Acute ischemic CVA - MRI acute right pericallosal frontal lobe infarct, subacute infarct in right occipital lobe - MRA no significant stenosis of intracranial vasculature - Carotid Doppler showed bilateral ICAs with stenosis of 1-39% - 2D Echo EF 60-65%.  Abnormal mitral valve, moderate calcification of aortic valve with aortic valve sclerosis - LDL 30 - HgbA1c 5.1 - aspirin  81 mg daily prior to admission, now on aspirin  81 mg daily and clopidogrel  75 mg daily for 3 weeks and then clopidogrel  75 mg a - Will need 30 days cardiac monitoring as an outpatient  Leukocytosis Diarrhea -White blood cell count trending up, he denies any dysuria, cough, will check chest x-ray, urine analysis, given his diarrhea will check C.  difficile and GI panel - Of note patient had recent circumcision July 2025 for phimosis, but no concerning finding on physical exam -Monitor off antibiotics   Severe upper kalemia Hypophosphatemia Hypomagnesemia -Severe electrolyte derangement, most likely in the setting of malnutrition and chronic diarrhea, continue to monitor closely and replace aggressively  AKI on CKDIIIa -Creatinine appears to be 1.4-1.7 at baseline - Due to volume depletion and dehydration, continue with IV fluids and avoid nephrotoxic medications, continue to hold lisinopril   Hypertension - Resume amlodipine , hold lisinopril  due to AKI - Continue amlodipine    Dyslipidemia - DL is at 30, continue with home atorvastatin   Tobacco abuse -Counseled  Substance abuse -Cocaine abuse in the past, he currently denies any recent illicit drug or recreational drug use -Will send UDS   Type 2 diabetes with hyperglycemia - SSI and carb modified diet, A1c is acceptable at 5.1   Depression   Chronic pain - Continue oxycodone    Protein calorie malnutrition - Nutritionist consulted    DVT prophylaxis: Heparin  Code Status: Full code Family Communication: (None at bedside Disposition:   Status is: Inpatient    Consultants:  Neurology  Subjective:  He reports his chronic lower back pain, as well reports diarrhea  Objective: Vitals:   01/28/24 2006 01/29/24 0000 01/29/24 0402 01/29/24 0759  BP: (!) 169/96 (!) 144/88 (!) 149/86   Pulse:      Resp: (!) 24 17 15    Temp: 98.6 F (37 C) 98.6 F (37 C) 98.6 F (37 C) 97.6 F (  36.4 C)  TempSrc: Axillary Axillary Axillary Oral  SpO2:    98%    Intake/Output Summary (Last 24 hours) at 01/29/2024 1106 Last data filed at 01/29/2024 0500 Gross per 24 hour  Intake 120 ml  Output 550 ml  Net -430 ml   There were no vitals filed for this visit.  Examination:  Awake Alert, Oriented X 3, frail, deconditioned and chronically ill-appearing  Good air movement  bilaterally, CTAB RRR,No Gallops,Rubs or new Murmurs +ve B.Sounds, Abd Soft No Cyanosis, Clubbing or edema    Data Reviewed: I have personally reviewed following labs and imaging studies  CBC: Recent Labs  Lab 01/27/24 1809 01/27/24 1838 01/28/24 0328 01/29/24 0548  WBC 9.8  --  14.0* 21.6*  NEUTROABS 7.2  --   --   --   HGB 15.2 17.0 13.2 12.5*  HCT 45.3 50.0 38.4* 35.4*  MCV 101.8*  --  101.1* 98.9  PLT 193  --  180 130*    Basic Metabolic Panel: Recent Labs  Lab 01/27/24 1809 01/27/24 1838 01/28/24 0328 01/28/24 0338 01/28/24 1509 01/28/24 2155 01/29/24 0548  NA 140 144  --  143 139 140 142  K 2.2* 2.4*  --  2.3* 2.5* 2.4* 2.4*  CL 101 103  --  104 104 105 106  CO2 24  --   --  25 22 24 22   GLUCOSE 149* 153*  --  111* 255* 143* 106*  BUN 12 18  --  13 13 13 12   CREATININE 2.11* 2.10*  --  2.07* 1.93* 1.82* 1.70*  CALCIUM  9.2  --   --  8.7* 8.3* 8.3* 8.5*  MG  --   --  1.3*  --   --   --  1.7  PHOS  --   --   --   --   --   --  1.8*    GFR: CrCl cannot be calculated (Unknown ideal weight.).  Liver Function Tests: Recent Labs  Lab 01/27/24 1809  AST 57*  ALT 22  ALKPHOS 196*  BILITOT 1.0  PROT 8.1  ALBUMIN 2.7*    CBG: Recent Labs  Lab 01/28/24 1300 01/28/24 1724 01/28/24 2110 01/29/24 0802 01/29/24 0831  GLUCAP 62* 319* 87 58* 92     Recent Results (from the past 240 hours)  Blood culture (routine x 2)     Status: None (Preliminary result)   Collection Time: 01/28/24  3:28 AM   Specimen: BLOOD  Result Value Ref Range Status   Specimen Description BLOOD BLOOD LEFT ARM  Final   Special Requests   Final    BOTTLES DRAWN AEROBIC AND ANAEROBIC Blood Culture results may not be optimal due to an inadequate volume of blood received in culture bottles   Culture   Final    NO GROWTH 1 DAY Performed at [facility], [address] [place], [place] [address]    Report Status PENDING  Incomplete  Blood culture (routine x 2)     Status:  None (Preliminary result)   Collection Time: 01/28/24  3:28 AM   Specimen: BLOOD  Result Value Ref Range Status   Specimen Description BLOOD BLOOD RIGHT ARM  Final   Special Requests   Final    BOTTLES DRAWN AEROBIC ONLY Blood Culture results may not be optimal due to an inadequate volume of blood received in culture bottles   Culture   Final    NO GROWTH 1 DAY Performed at [facility]  Lab, [address] [place], [place] [address]    Report Status PENDING  Incomplete         Radiology Studies: DG Chest Port 1 View Result Date: 01/29/2024 CLINICAL DATA:  Leukocytosis. EXAM: PORTABLE CHEST 1 VIEW COMPARISON:  Chest CT 03/14/2023. Radiographs 03/14/2023 and 07/28/2017. FINDINGS: 0858 hours. The heart size and mediastinal contours are stable with aortic atherosclerosis. The lungs appear clear. There are underlying emphysematous changes with overlying skin folds bilaterally. No evidence of pneumothorax or significant pleural effusion. The bones appear unchanged. IMPRESSION: No evidence of acute cardiopulmonary process. Emphysema. Electronically Signed   By: Elsie Perone M.D.   On: 01/29/2024 09:57   VAS US  CAROTID Result Date: 01/28/2024 Carotid Arterial Duplex Study Patient Name:  Dennis Zhang  Date of Exam:   01/28/2024 Medical Rec #: [id number]        Accession #:    [id number] Date of Birth: [birth year]         Patient Gender: M Patient Age:   25 years Exam Location:  [facility] Procedure:      VAS US  CAROTID Referring Phys: ELIGIO LAV --------------------------------------------------------------------------------  Indications:  CVA. Risk Factors: Hypertension, hyperlipidemia, Diabetes, current smoker. Performing Technologist: Elmarie Lindau, RVT  Examination Guidelines: A complete evaluation includes B-mode imaging, spectral Doppler, color Doppler, and power Doppler as needed of all accessible portions of each vessel. Bilateral testing is considered an integral part of a complete  examination. Limited examinations for reoccurring indications may be performed as noted.  Right Carotid Findings: +----------+-------+-------+--------+---------------------------------+--------+           PSV    EDV    StenosisPlaque Description               Comments           cm/s   cm/s                                                     +----------+-------+-------+--------+---------------------------------+--------+ CCA Prox  76     11                                                       +----------+-------+-------+--------+---------------------------------+--------+ CCA Distal60     14             irregular and heterogenous                +----------+-------+-------+--------+---------------------------------+--------+ ICA Prox  118    40     1-39%   irregular, calcific and                                                   heterogenous                              +----------+-------+-------+--------+---------------------------------+--------+ ICA Mid   126    41                                                       +----------+-------+-------+--------+---------------------------------+--------+  ICA Distal100    35                                                       +----------+-------+-------+--------+---------------------------------+--------+ ECA       81                                                              +----------+-------+-------+--------+---------------------------------+--------+ +----------+--------+-------+----------------+-------------------+           PSV cm/sEDV cmsDescribe        Arm Pressure (mmHG) +----------+--------+-------+----------------+-------------------+ [id number]            Multiphasic, WNL                    +----------+--------+-------+----------------+-------------------+ +---------+--------+--+--------+--+---------+ VertebralPSV cm/s73EDV cm/s16Antegrade  +---------+--------+--+--------+--+---------+  Left Carotid Findings: +----------+--------+--------+--------+--------------------------+--------+           PSV cm/sEDV cm/sStenosisPlaque Description        Comments +----------+--------+--------+--------+--------------------------+--------+ CCA Prox  98      21                                                 +----------+--------+--------+--------+--------------------------+--------+ CCA Distal62      20              smooth and heterogenous            +----------+--------+--------+--------+--------------------------+--------+ ICA Prox  87      28              irregular and heterogenous         +----------+--------+--------+--------+--------------------------+--------+ ICA Mid   111     31                                                 +----------+--------+--------+--------+--------------------------+--------+ ICA Distal139     43                                                 +----------+--------+--------+--------+--------------------------+--------+ ECA       104                                                        +----------+--------+--------+--------+--------------------------+--------+ +----------+--------+--------+----------------+-------------------+           PSV cm/sEDV cm/sDescribe        Arm Pressure (mmHG) +----------+--------+--------+----------------+-------------------+ [id number]             Multiphasic, WNL                    +----------+--------+--------+----------------+-------------------+ +---------+--------+--+--------+--+---------+ VertebralPSV cm/s53EDV cm/s13Antegrade +---------+--------+--+--------+--+---------+  Summary: Right Carotid: Velocities in the right ICA are consistent with a 1-39% stenosis. Left Carotid: Velocities in the left ICA are consistent with a 1-39% stenosis. Vertebrals:  Bilateral vertebral arteries demonstrate antegrade flow. Subclavians: Normal  flow hemodynamics were seen in bilateral subclavian              arteries. *See table(s) above for measurements and observations.     Preliminary    ECHOCARDIOGRAM COMPLETE Result Date: 01/28/2024    ECHOCARDIOGRAM REPORT   Patient Name:   Dennis Zhang Date of Exam: 01/28/2024 Medical Rec #:  [id number]       Height:       74.0 in Accession #:    [id number]      Weight:       148.0 lb Date of Birth:  07/02/1955        BSA:          1.912 m Patient Age:    68 years        BP:           142/114 mmHg Patient Gender: M               HR:           96 bpm. Exam Location:  Inpatient Procedure: 2D Echo, Cardiac Doppler and Color Doppler (Both Spectral and Color            Flow Doppler were utilized during procedure). Indications:    CVA  History:        Patient has no prior history of Echocardiogram examinations.  Sonographer:    Therisa Crouch Referring Phys: [id number] ASHISH ARORA IMPRESSIONS  1. Left ventricular ejection fraction, by estimation, is 60 to 65%. The left ventricle has normal function. The left ventricle has no regional wall motion abnormalities. Left ventricular diastolic parameters were normal.  2. Right ventricular systolic function is normal. The right ventricular size is normal.  3. The mitral valve is abnormal. No evidence of mitral valve regurgitation. No evidence of mitral stenosis.  4. Sclerosis of non coronary cusp. The aortic valve is tricuspid. There is moderate calcification of the aortic valve. There is mild thickening of the aortic valve. Aortic valve regurgitation is not visualized. Aortic valve sclerosis is present, with no  evidence of aortic valve stenosis.  5. The inferior vena cava is normal in size with greater than 50% respiratory variability, suggesting right atrial pressure of 3 mmHg. FINDINGS  Left Ventricle: Left ventricular ejection fraction, by estimation, is 60 to 65%. The left ventricle has normal function. The left ventricle has no regional wall motion abnormalities. Strain was  performed and the global longitudinal strain is indeterminate. The left ventricular internal cavity size was normal in size. There is no left ventricular hypertrophy. Left ventricular diastolic parameters were normal. Right Ventricle: The right ventricular size is normal. No increase in right ventricular wall thickness. Right ventricular systolic function is normal. Left Atrium: Left atrial size was normal in size. Right Atrium: Right atrial size was normal in size. Pericardium: Trivial pericardial effusion is present. The pericardial effusion is posterior to the left ventricle. Mitral Valve: The mitral valve is abnormal. There is mild thickening of the mitral valve leaflet(s). No evidence of mitral valve regurgitation. No evidence of mitral valve stenosis. Tricuspid Valve: The tricuspid valve is normal in structure. Tricuspid valve regurgitation is mild . No evidence of tricuspid stenosis. Aortic Valve: Sclerosis of non coronary cusp. The aortic valve is tricuspid. There is moderate calcification  of the aortic valve. There is mild thickening of the aortic valve. Aortic valve regurgitation is not visualized. Aortic valve sclerosis is present, with no evidence of aortic valve stenosis. Pulmonic Valve: The pulmonic valve was normal in structure. Pulmonic valve regurgitation is not visualized. No evidence of pulmonic stenosis. Aorta: The aortic root is normal in size and structure. Venous: The inferior vena cava is normal in size with greater than 50% respiratory variability, suggesting right atrial pressure of 3 mmHg. IAS/Shunts: No atrial level shunt detected by color flow Doppler. Additional Comments: 3D was performed not requiring image post processing on an independent workstation and was indeterminate.  LEFT VENTRICLE PLAX 2D LVIDd:         2.94 cm   Diastology LVIDs:         1.74 cm   LV e' medial:    9.25 cm/s LV PW:         1.04 cm   LV E/e' medial:  7.3 LV IVS:        1.06 cm   LV e' lateral:   9.57 cm/s LVOT  diam:     2.14 cm   LV E/e' lateral: 7.1 LVOT Area:     3.60 cm  RIGHT VENTRICLE             IVC RV S prime:     12.30 cm/s  IVC diam: 1.07 cm TAPSE (M-mode): 1.8 cm LEFT ATRIUM             Index LA diam:        2.15 cm 1.12 cm/m LA Vol (A2C):   48.7 ml 25.47 ml/m LA Vol (A4C):   35.7 ml 18.67 ml/m LA Biplane Vol: 41.3 ml 21.60 ml/m   AORTA Ao Root diam: 3.48 cm Ao Asc diam:  3.44 cm MITRAL VALVE MV Area (PHT): 4.24 cm    SHUNTS MV Decel Time: 179 msec    Systemic Diam: 2.14 cm MV E velocity: 67.60 cm/s MV A velocity: 77.00 cm/s MV E/A ratio:  0.88 Maude Emmer MD Electronically signed by Maude Emmer MD Signature Date/Time: 01/28/2024/10:41:33 AM    Final    MR ANGIO HEAD WO CONTRAST Result Date: 01/28/2024 EXAM: MR Angiography Head without intravenous Contrast. 01/28/2024 05:00:53 AM TECHNIQUE: Magnetic resonance angiography images of the head without intravenous contrast. Multiplanar 2D and 3D reformatted images are provided for review. COMPARISON: None provided. CLINICAL HISTORY: Stroke/TIA, determine embolic source. FINDINGS: ANTERIOR CIRCULATION: No significant stenosis of the internal carotid arteries. No significant stenosis of the anterior cerebral arteries. No significant stenosis of the middle cerebral arteries. No aneurysm. 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 Fenestration of the basilar artery. No significant stenosis of the posterior cerebral arteries. No significant stenosis of the basilar artery. No significant stenosis of the vertebral arteries. No aneurysm. IMPRESSION: 1. No significant stenosis of the intracranial vasculature. 2. 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 Electronically signed by: Evalene Coho MD 01/28/2024 05:14 AM EDT RP Workstation: [id number]   MR BRAIN WO CONTRAST Result Date: 01/28/2024 CLINICAL DATA:  Neuro deficit, acute, stroke suspected EXAM: MRI HEAD WITHOUT  CONTRAST TECHNIQUE: Multiplanar, multiecho pulse sequences of the brain and surrounding structures were obtained without intravenous contrast. COMPARISON:  CT head 01/27/2024. 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 Mild edema. Punctate acute or early subacute infarct in the right occipital lobe. No mass effect. Patchy white matter T2 hyperintensities, compatible  with chronic microvascular ischemic disease. Cerebral atrophy. No evidence of acute hemorrhage, mass lesion, less shift or hydrocephalus. Vascular: Normal flow voids. Skull and upper cervical spine: Normal marrow signal. Sinuses/Orbits: Negative. Other: No mastoid effusions. IMPRESSION: 1. Small acute right pericallosal frontal lobe infarct. 2. Punctate acute or subacute infarct in the right occipital lobe. Electronically Signed   By: Gilmore GORMAN Molt M.D.   On: 01/28/2024 02:49   CT Cervical Spine Wo Contrast Result Date: 01/27/2024 CLINICAL DATA:  Neck trauma EXAM: CT CERVICAL SPINE WITHOUT CONTRAST TECHNIQUE: Multidetector CT imaging of the cervical spine was performed without intravenous contrast. Multiplanar CT image reconstructions were also generated. RADIATION DOSE REDUCTION: This exam was performed according to the departmental dose-optimization program which includes automated exposure control, adjustment of the mA and/or kV according to patient size and/or use of iterative reconstruction technique. COMPARISON:  08/03/2023 FINDINGS: Alignment: Normal Skull base and vertebrae: No acute fracture. No primary bone lesion or focal pathologic process. Soft tissues and spinal canal: No prevertebral fluid or swelling. No visible canal hematoma. Disc levels: Advanced multi level degenerative disc and facet disease. Upper chest: No acute findings.  Emphysema. Other: None IMPRESSION: Advanced multilevel degenerative changes. No acute bony abnormality. Emphysema. Electronically Signed   By: Franky Crease M.D.   On: 01/27/2024  22:17   CT HEAD WO CONTRAST Result Date: 01/27/2024 CLINICAL DATA:  Possible TIA EXAM: CT HEAD WITHOUT CONTRAST TECHNIQUE: Contiguous axial images were obtained from the base of the skull through the vertex without intravenous contrast. RADIATION DOSE REDUCTION: This exam was performed according to the departmental dose-optimization program which includes automated exposure control, adjustment of the mA and/or kV according to patient size and/or use of iterative reconstruction technique. COMPARISON:  08/03/2023 FINDINGS: Brain: There is atrophy and chronic small vessel disease changes. No acute intracranial abnormality. Specifically, no hemorrhage, hydrocephalus, mass lesion, acute infarction, or significant intracranial injury. Vascular: No hyperdense vessel or unexpected calcification. Skull: No acute calvarial abnormality. Sinuses/Orbits: No acute findings Other: None IMPRESSION: Atrophy, chronic microvascular disease. No acute intracranial abnormality. Electronically Signed   By: Franky Crease M.D.   On: 01/27/2024 22:14        Scheduled Meds:  aspirin  EC  81 mg Oral Daily   atorvastatin   10 mg Oral Daily   clopidogrel   75 mg Oral Daily   DULoxetine   30 mg Oral Daily   feeding supplement  237 mL Oral TID BM   folic acid   1 mg Oral Daily   heparin   5,000 Units Subcutaneous Q8H   insulin  aspart  0-5 Units Subcutaneous QHS   insulin  aspart  0-9 Units Subcutaneous TID WC   latanoprost   1 drop Both Eyes QHS   magnesium  oxide  400 mg Oral Daily   multivitamin with minerals  1 tablet Oral Daily   potassium chloride   40 mEq Oral Q6H   tamsulosin   0.4 mg Oral QPC breakfast   thiamine   100 mg Oral Daily   Continuous Infusions:  potassium chloride  10 mEq (01/29/24 0945)     LOS: 1 day      Brayton Lye, MD [facility]   To contact the attending provider between 7A-7P or the covering provider during after hours 7P-7A, please log into the web site [email or web address] and access  using universal [place] password for that web site. If you do not have the password, please call the hospital operator.  01/29/2024, 11:06 AM

## 2024-01-29 NOTE — [place] FL2 (Signed)
 [place]  MEDICAID FL2 LEVEL OF CARE FORM     IDENTIFICATION  Patient Name: Dennis Zhang Birthdate: Mar 23, 1956 Sex: male Admission Date (Current Location): 01/27/2024  [facility] and [place] Number:  Producer, television/film/video and Address:  [facility], 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   No post-op complications 12/11/2023   Spontaneous pneumothorax 07/25/2017   Alcohol  withdrawal syndrome with complication (HCC)    Elevated lactic acid level    Seizure (HCC)    Leukopenia 05/23/2016   Lactic acidosis 05/23/2016   Syncope and collapse 05/22/2016   Syncope 06/15/2014   Hypotension 06/15/2014   Fall    Arterial hypotension    Weakness    Dehydration 06/14/2014   Protein-calorie malnutrition, severe (HCC) 05/21/2014   Acute kidney injury (HCC) 05/20/2014   Orthostatic hypotension 05/20/2014   Alcohol  abuse 05/20/2014   Transaminitis 05/20/2014   Thrombocytopenia (HCC) 05/20/2014   Acute renal failure (HCC) 03/03/2014   Alcohol  intoxication (HCC) 03/03/2014   Increased anion gap metabolic acidosis 03/03/2014   Malnutrition of moderate degree (HCC) 03/03/2014   Hypokalemia 10/06/2012   Gram-negative bacteremia 10/06/2012   Leukocytosis 10/03/2012   Chronic pancreatitis (HCC) 10/03/2012   AKI (acute kidney injury) (HCC) 10/03/2012   Alcoholic hepatitis 10/03/2012   Hypoglycemia secondary to sulfonylurea  10/03/2012    Orientation RESPIRATION BLADDER Height & Weight     Self, Place  Normal Incontinent, External catheter Weight:   Height:     BEHAVIORAL SYMPTOMS/MOOD NEUROLOGICAL BOWEL NUTRITION STATUS      Incontinent Diet (See dc summary)  AMBULATORY STATUS COMMUNICATION OF NEEDS Skin   Limited Assist Verbally Normal                       Personal Care Assistance Level of Assistance  Bathing, Feeding, Dressing Bathing Assistance: Limited assistance Feeding assistance: Limited assistance Dressing Assistance: Limited assistance     Functional Limitations Info             SPECIAL CARE FACTORS FREQUENCY  PT (By licensed PT), OT (By licensed OT), Speech therapy     PT Frequency: 5x/week OT Frequency: 5x/week     Speech Therapy Frequency: 2x/week      Contractures Contractures Info: Not present    Additional Factors Info  Code Status, Allergies Code Status Info: Full Allergies Info: Ace Inhibitors, Aspirin , Metformin, Methocarbamol, Tylenol  (Acetaminophen )           Current Medications (01/29/2024):  This is the current hospital active medication list Current Facility-Administered Medications  Medication Dose Route Frequency Provider Last Rate Last Admin   albuterol  (PROVENTIL ) (2.5 MG/3ML) 0.083% nebulizer solution 3 mL  3 mL Inhalation Q6H PRN Swayze, Ava, DO       artificial tears ophthalmic solution 1 drop  1 drop Both Eyes PRN Swayze, Ava, DO       aspirin  EC tablet 81 mg  81 mg Oral  Daily Swayze, Ava, DO   81 mg at 01/29/24 0911   atorvastatin  (LIPITOR) tablet 10 mg  10 mg Oral Daily Swayze, Ava, DO   10 mg at 01/29/24 0911   clopidogrel  (PLAVIX ) tablet 75 mg  75 mg Oral Daily Arora, Ashish, MD   75 mg at 01/29/24 0911   DULoxetine  (CYMBALTA ) DR capsule 30 mg  30 mg Oral Daily Swayze, Ava, DO   30 mg at 01/29/24 0911   feeding supplement (ENSURE PLUS HIGH PROTEIN) liquid 237 mL  237 mL Oral TID BM Swayze, Ava, DO   237 mL at 01/29/24 0911   folic acid   (FOLVITE ) tablet 1 mg  1 mg Oral Daily Swayze, Ava, DO   1 mg at 01/29/24 0911   gabapentin  (NEURONTIN ) capsule 300 mg  300 mg Oral QID PRN Swayze, Ava, DO   300 mg at 01/28/24 1031   heparin  injection 5,000 Units  5,000 Units Subcutaneous Q8H Shah, Pratik D, DO   5,000 Units at 01/29/24 9462   insulin  aspart (novoLOG ) injection 0-5 Units  0-5 Units Subcutaneous QHS Maree, Pratik D, DO       insulin  aspart (novoLOG ) injection 0-9 Units  0-9 Units Subcutaneous TID WC Maree, Pratik D, DO   7 Units at 01/28/24 1740   latanoprost  (XALATAN ) 0.005 % ophthalmic solution 1 drop  1 drop Both Eyes QHS Swayze, Ava, DO   1 drop at 01/28/24 2119   magnesium  oxide (MAG-OX) tablet 400 mg  400 mg Oral Daily Swayze, Ava, DO   400 mg at 01/29/24 0911   multivitamin with minerals tablet 1 tablet  1 tablet Oral Daily Swayze, Ava, DO   1 tablet at 01/29/24 0911   ondansetron  (ZOFRAN ) tablet 4 mg  4 mg Oral Q6H PRN Maree, Pratik D, DO       Or   ondansetron  (ZOFRAN ) injection 4 mg  4 mg Intravenous Q6H PRN Maree, Pratik D, DO       oxyCODONE  (Oxy IR/ROXICODONE ) immediate release tablet 5 mg  5 mg Oral Q6H PRN Swayze, Ava, DO   5 mg at 01/29/24 0833   phosphorus (K PHOS  NEUTRAL) tablet 500 mg  500 mg Oral QID Elgergawy, Dawood S, MD       potassium chloride  10 mEq in 100 mL IVPB  10 mEq Intravenous Q1 Hr x 6 Elgergawy, Brayton RAMAN, MD 100 mL/hr at 01/29/24 1340 10 mEq at 01/29/24 1340   [START ON 01/30/2024] potassium chloride  SA (KLOR-CON  M) CR tablet 20 mEq  20 mEq Oral QPC breakfast Elgergawy, Dawood S, MD       potassium chloride  SA (KLOR-CON  M) CR tablet 40 mEq  40 mEq Oral Q6H Elgergawy, Dawood S, MD   40 mEq at 01/29/24 9164   tamsulosin  (FLOMAX ) capsule 0.4 mg  0.4 mg Oral QPC breakfast Swayze, Ava, DO   0.4 mg at 01/29/24 0911   thiamine  (VITAMIN B1) tablet 100 mg  100 mg Oral Daily Swayze, Ava, DO   100 mg at 01/29/24 0911     Discharge Medications: Please see discharge summary for a list of discharge  medications.  Relevant Imaging Results:  Relevant Lab Results:   Additional Information SSN [id number]  Inocente RAMAN Kindle, LCSW

## 2024-01-29 NOTE — Evaluation (Signed)
 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   No post-op complications 12/11/2023   Spontaneous pneumothorax 07/25/2017   Alcohol  withdrawal syndrome with complication (HCC)    Elevated lactic acid level    Seizure (HCC)    Leukopenia 05/23/2016   Lactic acidosis 05/23/2016   Syncope and collapse 05/22/2016   Syncope 06/15/2014   Hypotension 06/15/2014   Fall    Arterial hypotension    Weakness    Dehydration 06/14/2014   Protein-calorie malnutrition, severe (HCC) 05/21/2014   Acute kidney injury (HCC) 05/20/2014   Orthostatic hypotension 05/20/2014   Alcohol  abuse 05/20/2014   Transaminitis 05/20/2014   Thrombocytopenia (HCC) 05/20/2014   Acute renal failure (HCC) 03/03/2014   Alcohol  intoxication (HCC) 03/03/2014   Increased anion gap metabolic acidosis 03/03/2014   Malnutrition of moderate degree (HCC) 03/03/2014   Hypokalemia 10/06/2012   Gram-negative bacteremia 10/06/2012   Leukocytosis 10/03/2012   Chronic pancreatitis (HCC) 10/03/2012   AKI (acute kidney injury) (HCC) 10/03/2012   Alcoholic hepatitis 10/03/2012   Hypoglycemia secondary to sulfonylurea 10/03/2012   Past Medical History:  Past Medical History:  Diagnosis Date   Arthritis    Chronic pain    Depression    Diabetes mellitus without complication (HCC)    borderline   Hepatitis    Hyperlipidemia    Hypertension    Seizures (HCC)    Past Surgical History:  Past Surgical History:  Procedure Laterality Date   ABDOMINAL SURGERY     APPENDECTOMY     CIRCUMCISION N/A 12/11/2023   Procedure: CIRCUMCISION, ADULT;  Surgeon: Elisabeth Valli BIRCH, MD;  Location: [facility];  Service: Urology;  Laterality: N/A;  CIRCUMCISION, EXCISIONAL PENILE BIOPSY   CYST EXCISION     HEMORROIDECTOMY     PENILE BIOPSY N/A 12/11/2023   Procedure: BIOPSY, PENIS;  Surgeon: Elisabeth Valli BIRCH, MD;  Location: [facility];  Service: Urology;  Laterality: N/A;   HPI:  Pt is 68 yo presenting [facility] due to L sided facial droop and slurred speech. Mri with findings of R frontal and occipital acute ischemic CVA. PMH: chrnoic pain, depression, DM II, dyslipidemia, HTN, CKD.   Assessment / Plan / Recommendation Clinical Impression  Pt demonstrated significant impairments in memory, problem solving, speech intelligibility, situational/temporal orientation and awareness. SLP questions validity/severity of impairments as pt was drowsy, said he has not slept. His responses were at times tangential and irrelevant. ST recommeds continued therapy with diagnostic treatment of severity of impairments when more alert. Would benefit from > 3 hours inpatient tx.    SLP Assessment  SLP Recommendation/Assessment: Patient needs continued Speech Language Pathology Services SLP Visit Diagnosis: Dysarthria and anarthria (R47.1);Cognitive communication deficit (R41.841)     Assistance Recommended at Discharge     Functional Status Assessment Patient has had a recent decline in their functional status and demonstrates the ability to make significant improvements in function in a reasonable and predictable amount of time.  Frequency and Duration min 2x/week  2 weeks      SLP Evaluation Cognition  Overall Cognitive Status: Impaired/Different from baseline Arousal/Alertness:  (sleepy/drowsy) Orientation Level: Oriented to person;Oriented to place;Disoriented to situation (disoriented to month, oriented to  year) Year: 2025 Month: December Attention: Sustained Sustained Attention: Appears intact Memory: Impaired Memory Impairment: Retrieval deficit;Storage deficit (0/4  independent, 1/4 with choice) Awareness: Impaired Awareness Impairment: Intellectual impairment Problem Solving: Impaired Problem Solving Impairment: Verbal basic Safety/Judgment: Impaired       Comprehension  Auditory Comprehension Overall Auditory Comprehension: Appears within functional limits for tasks assessed Visual Recognition/Discrimination Discrimination: Not tested Reading Comprehension Reading Status: Not tested    Expression Expression Primary Mode of Expression: Verbal Verbal Expression Overall Verbal Expression:  (overall, likely functional when more alert, some difficulty expressing) Initiation: No impairment Level of Generative/Spontaneous Verbalization: Sentence Repetition:  (NT) Naming: Not tested Written Expression Written Expression: Not tested   Oral / Motor  Oral Motor/Sensory Function Overall Oral Motor/Sensory Function:  (will assess) Motor Speech Overall Motor Speech: Impaired Respiration: Within functional limits Phonation: Low vocal intensity Resonance: Within functional limits Articulation: Impaired Level of Impairment: Phrase Intelligibility: Intelligibility reduced Word: 75-100% accurate Phrase: 50-74% accurate Motor Planning: Within functional limits Motor Speech Errors: Not applicable            Dennis Zhang 01/29/2024, 11:09 AM

## 2024-01-30 DIAGNOSIS — R197 Diarrhea, unspecified: Secondary | ICD-10-CM

## 2024-01-30 DIAGNOSIS — N179 Acute kidney failure, unspecified: Secondary | ICD-10-CM

## 2024-01-30 DIAGNOSIS — E440 Moderate protein-calorie malnutrition: Secondary | ICD-10-CM

## 2024-01-30 DIAGNOSIS — I639 Cerebral infarction, unspecified: Secondary | ICD-10-CM | POA: Diagnosis not present

## 2024-01-30 LAB — GASTROINTESTINAL PANEL BY PCR, STOOL (REPLACES STOOL CULTURE)

## 2024-01-30 LAB — CBC
HCT: 32.7 % — ABNORMAL LOW (ref 39.0–52.0)
Hemoglobin: 11.4 g/dL — ABNORMAL LOW (ref 13.0–17.0)
MCH: 34.7 pg — ABNORMAL HIGH (ref 26.0–34.0)
MCHC: 34.9 g/dL (ref 30.0–36.0)
MCV: 99.4 fL (ref 80.0–100.0)
Platelets: 116 K/uL — ABNORMAL LOW (ref 150–400)
RBC: 3.29 MIL/uL — ABNORMAL LOW (ref 4.22–5.81)
RDW: 15.1 % (ref 11.5–15.5)
WBC: 20.4 K/uL — ABNORMAL HIGH (ref 4.0–10.5)
nRBC: 0 % (ref 0.0–0.2)

## 2024-01-30 LAB — BASIC METABOLIC PANEL WITH GFR
Anion gap: 11 (ref 5–15)
BUN: 11 mg/dL (ref 8–23)
CO2: 21 mmol/L — ABNORMAL LOW (ref 22–32)
Calcium: 8 mg/dL — ABNORMAL LOW (ref 8.9–10.3)
Chloride: 110 mmol/L (ref 98–111)
Creatinine, Ser: 1.57 mg/dL — ABNORMAL HIGH (ref 0.61–1.24)
GFR, Estimated: 47 mL/min — ABNORMAL LOW (ref >60)
Glucose, Bld: 88 mg/dL (ref 70–99)
Potassium: 3 mmol/L — ABNORMAL LOW (ref 3.5–5.1)
Sodium: 142 mmol/L (ref 135–145)

## 2024-01-30 LAB — GLUCOSE, CAPILLARY
Glucose-Capillary: 25 mg/dL — CL (ref 70–99)
Glucose-Capillary: 93 mg/dL (ref 70–99)
Glucose-Capillary: 62 mg/dL — ABNORMAL LOW (ref 70–99)
Glucose-Capillary: 145 mg/dL — ABNORMAL HIGH (ref 70–99)
Glucose-Capillary: 165 mg/dL — ABNORMAL HIGH (ref 70–99)
Glucose-Capillary: 54 mg/dL — ABNORMAL LOW (ref 70–99)
Glucose-Capillary: 76 mg/dL (ref 70–99)
Glucose-Capillary: 99 mg/dL (ref 70–99)

## 2024-01-30 LAB — MAGNESIUM: Magnesium: 1.9 mg/dL (ref 1.7–2.4)

## 2024-01-30 LAB — PHOSPHORUS: Phosphorus: 2.4 mg/dL — ABNORMAL LOW (ref 2.5–4.6)

## 2024-01-30 MED ORDER — POTASSIUM CHLORIDE CRYS ER 20 MEQ PO TBCR
40.0000 meq | EXTENDED_RELEASE_TABLET | Freq: Once | ORAL | Status: AC
  Administered 2024-01-30: 40 meq via ORAL
  Filled 2024-01-30: qty 2

## 2024-01-30 MED ORDER — DEXTROSE 5 % IV SOLN
30.0000 mmol | Freq: Once | INTRAVENOUS | Status: AC
  Administered 2024-01-30: 30 mmol via INTRAVENOUS
  Filled 2024-01-30: qty 10

## 2024-01-30 NOTE — TOC Progression Note (Signed)
 Transition of Care [facility]) - Progression Note    Patient Details  Name: Dennis Zhang MRN: [id number] Date of Birth: 10-31-1955  [facility] CM/SW Contact  Inocente GORMAN Kindle, LCSW Phone Number: 01/30/2024, 1:53 PM  Clinical Narrative:    10am-CSW spoke with patient's sister who lives in [place]  and is currently doing outpatient physical therapy herself. She requests for patient to go to SNF. She is not aware of the SNF locations in [place]. CSW assisted her in narrowing the options to patient wanting a private room. She stated she was going to call the patient and check on him.  12pm-CSW and MSW intern met with patient and discussed plan. CSW went over SNF bed offers and ratings and he was familiar with a few of them. He has selected [facility]. His sister called him while CSW was leaving the room. CSW updated [facility]. They requested hospital send Creon with patient at discharge as it is too expensive for them to fill; per MD currently no need for Creon at discharge. [facility] also requested a nicotine  patch.   CSW following to begin insurance authorization.    Expected Discharge Plan: Skilled Nursing Facility Barriers to Discharge: Continued Medical Work up, English as a second language teacher               Expected Discharge Plan and Services In-house Referral: Clinical Social Work   Post Acute Care Choice: Skilled Nursing Facility Living arrangements for the past 2 months: Apartment                                       Social Drivers of Health (SDOH) Interventions SDOH Screenings   Food Insecurity: No Food Insecurity (01/29/2024)  Housing: High Risk (01/29/2024)  Transportation Needs: No Transportation Needs (01/29/2024)  Utilities: Not At Risk (01/29/2024)  Depression (PHQ2-9): Medium Risk (09/28/2020)  Social Connections: Moderately Isolated (01/29/2024)  Tobacco Use: High Risk (01/27/2024)    Readmission Risk Interventions     No data to display

## 2024-01-30 NOTE — Progress Notes (Addendum)
 PT Cancellation Note  Patient Details Name: Dennis Zhang MRN: [id number] DOB: [birth year]   Cancelled Treatment:    Reason Eval/Treat Not Completed: Other (comment). Pt incontinent of loose stool and IV leaking. Per nurse tech 3rd stool today. RUE IV leaking. Will try again tomorrow.   Rodgers ORN [facility] 01/30/2024, 4:17 PM Rodgers Opal PT Acute [employer] [phone]

## 2024-01-30 NOTE — Plan of Care (Signed)

## 2024-01-30 NOTE — Plan of Care (Signed)
  Problem: Education: Goal: Knowledge of disease or condition will improve Outcome: Not Progressing Goal: Knowledge of secondary prevention will improve (MUST DOCUMENT ALL) Outcome: Not Progressing Goal: Knowledge of patient specific risk factors will improve (DELETE if not current risk factor) Outcome: Not Progressing   Problem: Ischemic Stroke/TIA Tissue Perfusion: Goal: Complications of ischemic stroke/TIA will be minimized Outcome: Not Progressing   Problem: Coping: Goal: Will verbalize positive feelings about self Outcome: Not Progressing Goal: Will identify appropriate support needs Outcome: Not Progressing

## 2024-01-30 NOTE — Progress Notes (Signed)
 PROGRESS NOTE        PATIENT DETAILS Name: Dennis Zhang Age: 68 y.o. Sex: male Date of Birth: 1955/10/10 Admit Date: 01/27/2024 Admitting Physician Pratik JONETTA Fairly, DO ERE:Ropwpr, Bonni Lien  Brief Summary: Patient is a 68 y.o.  male with history of DM-2, HTN, HLD, chronic pain-who presented with left-sided facial droop-upon further workup-found to have acute CVA.  Significant events: 9/8>> admit to [facility].  Significant studies: 9/8>> MRI brain: Acute right frontal lobe infarct.  Punctate acute right occipital lobe infarct. 9/8>> MRI brain: No significant stenosis. 9/8>> echo: EF 60-65%. 9/8>> B/L carotid Doppler: No significant stenosis 9/8>> A1c: 5.1 9/8>> LDL: 30. 9/9>> CXR: No PNA  Significant microbiology data: 9/8>> blood culture: No growth 9/9>> stool studies for C. difficile: Negative 9/9>> GI pathogen panel stool: Negative  Procedures: None  Consults: Neurology  Subjective: Lying comfortably in bed-denies any chest pain or shortness of breath.  Claims no BM since yesterday.  Objective: Vitals: Blood pressure (!) 142/76, pulse 79, temperature (!) 97.5 F (36.4 C), temperature source Oral, resp. rate 15, SpO2 98%.   Exam: Gen Exam:Alert awake-not in any distress HEENT:atraumatic, normocephalic Chest: B/L clear to auscultation anteriorly CVS:S1S2 regular Abdomen:soft non tender, non distended Extremities:no edema Neurology: Non focal Skin: no rash  Pertinent Labs/Radiology:    Latest Ref Rng & Units 01/30/2024    3:59 AM 01/29/2024    5:48 AM 01/28/2024    3:28 AM  CBC  WBC 4.0 - 10.5 K/uL 20.4  21.6  14.0   Hemoglobin 13.0 - 17.0 g/dL 88.5  87.4  86.7   Hematocrit 39.0 - 52.0 % 32.7  35.4  38.4   Platelets 150 - 400 K/uL 116  130  180     Lab Results  Component Value Date   NA 142 01/30/2024   K 3.0 (L) 01/30/2024   CL 110 01/30/2024   CO2 21 (L) 01/30/2024      Assessment/Plan: Acute CVA Overall  improved-minimal left upper extremity weakness and left facial weakness Work up as above Stroke MD recommending aspirin /Plavix  x 3 weeks followed by Plavix  alone Needs 30-day heart monitoring arranged on discharge.  Diarrhea Seems to have resolved-no BM since yesterday Stool studies negative Supportive care in the interim  Leukocytosis Suspect secondary to diarrhea/gastroenteritis Blood cultures/stool studies negative Given overall stability-no other sources of infection apparent-watch closely. If leukocytosis worsens or if clinical condition changes-get CT abdomen/pelvis.  AKI on CKD stage IIIa AKI hemodynamically mediated secondary to diarrhea Improved with IVF/improvement in diarrhea Follow electrolytes Avoid nephrotoxic agents  Hypokalemia/hypophosphatemia Secondary to GI losses Replete/recheck.  HTN BP stable Lisinopril  on hold due to AKI issues.  HLD Statin  DM-2 Hypoglycemic event this morning Stop SSI Watch closely.  Recent Labs    01/29/24 2123 01/30/24 0755 01/30/24 0757  GLUCAP 93 25* 93     Chronic pain syndrome Stable Continue Cymbalta /Neurontin   BPH Flomax  Frequent bladder scans to ensure no retention.  Nutrition Status: Nutrition Problem: Severe Malnutrition Etiology: chronic illness Signs/Symptoms: severe muscle depletion, severe fat depletion Interventions: Ensure Enlive (each supplement provides 350kcal and 20 grams of protein), Magic cup, Liberalize Diet, MVI, Education   Code status:   Code Status: Full Code   DVT Prophylaxis: heparin  injection 5,000 Units Start: 01/28/24 0600   Family Communication: None at bedside   Disposition Plan: Status is: Inpatient Remains inpatient  appropriate because: Severity of illness   Planned Discharge Destination:Rehabilitation facility   Diet: Diet Order             Diet regular Room service appropriate? Yes with Assist; Fluid consistency: Thin  Diet effective now                      Antimicrobial agents: Anti-infectives (From admission, onward)    None        MEDICATIONS: Scheduled Meds:  aspirin  EC  81 mg Oral Daily   atorvastatin   10 mg Oral Daily   clopidogrel   75 mg Oral Daily   DULoxetine   30 mg Oral Daily   feeding supplement  237 mL Oral TID BM   folic acid   1 mg Oral Daily   heparin   5,000 Units Subcutaneous Q8H   insulin  aspart  0-5 Units Subcutaneous QHS   insulin  aspart  0-9 Units Subcutaneous TID WC   latanoprost   1 drop Both Eyes QHS   magnesium  oxide  400 mg Oral Daily   multivitamin with minerals  1 tablet Oral Daily   phosphorus  500 mg Oral QID   potassium chloride  SA  20 mEq Oral QPC breakfast   tamsulosin   0.4 mg Oral QPC breakfast   thiamine   100 mg Oral Daily   Continuous Infusions:  potassium PHOSPHATE  IVPB (in mmol)     PRN Meds:.albuterol , artificial tears, gabapentin , ondansetron  **OR** ondansetron  (ZOFRAN ) IV, oxyCODONE    I have personally reviewed following labs and imaging studies  LABORATORY DATA: CBC: Recent Labs  Lab 01/27/24 1809 01/27/24 1838 01/28/24 0328 01/29/24 0548 01/30/24 0359  WBC 9.8  --  14.0* 21.6* 20.4*  NEUTROABS 7.2  --   --   --   --   HGB 15.2 17.0 13.2 12.5* 11.4*  HCT 45.3 50.0 38.4* 35.4* 32.7*  MCV 101.8*  --  101.1* 98.9 99.4  PLT 193  --  180 130* 116*    Basic Metabolic Panel: Recent Labs  Lab 01/28/24 0328 01/28/24 0338 01/28/24 1509 01/28/24 2155 01/29/24 0548 01/29/24 1738 01/30/24 0359  NA  --  143 139 140 142  --  142  K  --  2.3* 2.5* 2.4* 2.4*  --  3.0*  CL  --  104 104 105 106  --  110  CO2  --  25 22 24 22   --  21*  GLUCOSE  --  111* 255* 143* 106*  --  88  BUN  --  13 13 13 12   --  11  CREATININE  --  2.07* 1.93* 1.82* 1.70*  --  1.57*  CALCIUM   --  8.7* 8.3* 8.3* 8.5*  --  8.0*  MG 1.3*  --   --   --  1.7 2.0 1.9  PHOS  --   --   --   --  1.8* 1.6* 2.4*    GFR: CrCl cannot be calculated (Unknown ideal weight.).  Liver Function  Tests: Recent Labs  Lab 01/27/24 1809  AST 57*  ALT 22  ALKPHOS 196*  BILITOT 1.0  PROT 8.1  ALBUMIN 2.7*   No results for input(s): LIPASE, AMYLASE in the last 168 hours. No results for input(s): AMMONIA in the last 168 hours.  Coagulation Profile: No results for input(s): INR, PROTIME in the last 168 hours.  Cardiac Enzymes: No results for input(s): CKTOTAL, CKMB, CKMBINDEX, TROPONINI in the last 168 hours.  BNP (last 3 results) No results for input(s): PROBNP in  the last 8760 hours.  Lipid Profile: Recent Labs    01/28/24 0328  CHOL 93  HDL 54  LDLCALC 30  TRIG 44  CHOLHDL 1.7    Thyroid  Function Tests: No results for input(s): TSH, T4TOTAL, FREET4, T3FREE, THYROIDAB in the last 72 hours.  Anemia Panel: No results for input(s): VITAMINB12, FOLATE, FERRITIN, TIBC, IRON, RETICCTPCT in the last 72 hours.  Urine analysis:    Component Value Date/Time   COLORURINE YELLOW 01/29/2024 1124   APPEARANCEUR HAZY (A) 01/29/2024 1124   LABSPEC 1.005 01/29/2024 1124   PHURINE 5.0 01/29/2024 1124   GLUCOSEU NEGATIVE 01/29/2024 1124   HGBUR MODERATE (A) 01/29/2024 1124   BILIRUBINUR NEGATIVE 01/29/2024 1124   KETONESUR NEGATIVE 01/29/2024 1124   PROTEINUR NEGATIVE 01/29/2024 1124   UROBILINOGEN 1.0 06/14/2014 1847   NITRITE POSITIVE (A) 01/29/2024 1124   LEUKOCYTESUR NEGATIVE 01/29/2024 1124    Sepsis Labs: Lactic Acid, Venous    Component Value Date/Time   LATICACIDVEN 1.5 01/28/2024 0328    MICROBIOLOGY: Recent Results (from the past 240 hours)  Blood culture (routine x 2)     Status: None (Preliminary result)   Collection Time: 01/28/24  3:28 AM   Specimen: BLOOD  Result Value Ref Range Status   Specimen Description BLOOD BLOOD LEFT ARM  Final   Special Requests   Final    BOTTLES DRAWN AEROBIC AND ANAEROBIC Blood Culture results may not be optimal due to an inadequate volume of blood received in culture bottles    Culture   Final    NO GROWTH 2 DAYS Performed at [facility], [address] [place], [place] [address]    Report Status PENDING  Incomplete  Blood culture (routine x 2)     Status: None (Preliminary result)   Collection Time: 01/28/24  3:28 AM   Specimen: BLOOD  Result Value Ref Range Status   Specimen Description BLOOD BLOOD RIGHT ARM  Final   Special Requests   Final    BOTTLES DRAWN AEROBIC ONLY Blood Culture results may not be optimal due to an inadequate volume of blood received in culture bottles   Culture   Final    NO GROWTH 2 DAYS Performed at [facility], [address], [place], [place] [address]    Report Status PENDING  Incomplete  C Difficile Quick Screen w PCR reflex     Status: None   Collection Time: 01/29/24  8:23 AM   Specimen: STOOL  Result Value Ref Range Status   C Diff antigen NEGATIVE NEGATIVE Final   C Diff toxin NEGATIVE NEGATIVE Final   C Diff interpretation No C. difficile detected.  Final    Comment: Performed at [facility], [address], [place], [place] [address]  Gastrointestinal Panel by PCR , Stool     Status: None   Collection Time: 01/29/24  8:24 AM   Specimen: STOOL  Result Value Ref Range Status   Campylobacter species NOT DETECTED NOT DETECTED Final   Plesimonas shigelloides NOT DETECTED NOT DETECTED Final   Salmonella species NOT DETECTED NOT DETECTED Final   Yersinia enterocolitica NOT DETECTED NOT DETECTED Final   Vibrio species NOT DETECTED NOT DETECTED Final   Vibrio cholerae NOT DETECTED NOT DETECTED Final   Enteroaggregative E coli (EAEC) NOT DETECTED NOT DETECTED Final   Enteropathogenic E coli (EPEC) NOT DETECTED NOT DETECTED Final   Enterotoxigenic E coli (ETEC) NOT DETECTED NOT DETECTED Final   Shiga like toxin producing  E coli (STEC) NOT DETECTED NOT DETECTED Final   Shigella/Enteroinvasive E coli (EIEC) NOT DETECTED NOT DETECTED Final   Cryptosporidium NOT DETECTED NOT DETECTED Final   Cyclospora  cayetanensis NOT DETECTED NOT DETECTED Final   Entamoeba histolytica NOT DETECTED NOT DETECTED Final   Giardia lamblia NOT DETECTED NOT DETECTED Final   Adenovirus F40/41 NOT DETECTED NOT DETECTED Final   Astrovirus NOT DETECTED NOT DETECTED Final   Norovirus GI/GII NOT DETECTED NOT DETECTED Final   Rotavirus A NOT DETECTED NOT DETECTED Final   Sapovirus (I, II, IV, and V) NOT DETECTED NOT DETECTED Final    Comment: Performed at [facility], [address] [place], [place] [address]    RADIOLOGY STUDIES/RESULTS: DG Chest Port 1 View Result Date: 01/29/2024 CLINICAL DATA:  Leukocytosis. EXAM: PORTABLE CHEST 1 VIEW COMPARISON:  Chest CT 03/14/2023. Radiographs 03/14/2023 and 07/28/2017. FINDINGS: 0858 hours. The heart size and mediastinal contours are stable with aortic atherosclerosis. The lungs appear clear. There are underlying emphysematous changes with overlying skin folds bilaterally. No evidence of pneumothorax or significant pleural effusion. The bones appear unchanged. IMPRESSION: No evidence of acute cardiopulmonary process. Emphysema. Electronically Signed   By: Elsie Perone M.D.   On: 01/29/2024 09:57   VAS US  CAROTID Result Date: 01/28/2024 Carotid Arterial Duplex Study Patient Name:  Dennis Zhang  Date of Exam:   01/28/2024 Medical Rec #: [id number]        Accession #:    [id number] Date of Birth: Feb 21, 1956         Patient Gender: M Patient Age:   23 years Exam Location:  [facility] Procedure:      VAS US  CAROTID Referring Phys: ELIGIO LAV --------------------------------------------------------------------------------  Indications:  CVA. Risk Factors: Hypertension, hyperlipidemia, Diabetes, current smoker. Performing Technologist: Elmarie Lindau, RVT  Examination Guidelines: A complete evaluation includes B-mode imaging, spectral Doppler, color Doppler, and power Doppler as needed of all accessible portions of each vessel. Bilateral testing is considered an integral  part of a complete examination. Limited examinations for reoccurring indications may be performed as noted.  Right Carotid Findings: +----------+-------+-------+--------+---------------------------------+--------+           PSV    EDV    StenosisPlaque Description               Comments           cm/s   cm/s                                                     +----------+-------+-------+--------+---------------------------------+--------+ CCA Prox  76     11                                                       +----------+-------+-------+--------+---------------------------------+--------+ CCA Distal60     14             irregular and heterogenous                +----------+-------+-------+--------+---------------------------------+--------+ ICA Prox  118    40     1-39%   irregular, calcific and  heterogenous                              +----------+-------+-------+--------+---------------------------------+--------+ ICA Mid   126    41                                                       +----------+-------+-------+--------+---------------------------------+--------+ ICA Distal100    35                                                       +----------+-------+-------+--------+---------------------------------+--------+ ECA       81                                                              +----------+-------+-------+--------+---------------------------------+--------+ +----------+--------+-------+----------------+-------------------+           PSV cm/sEDV cmsDescribe        Arm Pressure (mmHG) +----------+--------+-------+----------------+-------------------+ [id number]            Multiphasic, WNL                    +----------+--------+-------+----------------+-------------------+ +---------+--------+--+--------+--+---------+ VertebralPSV cm/s73EDV cm/s16Antegrade  +---------+--------+--+--------+--+---------+  Left Carotid Findings: +----------+--------+--------+--------+--------------------------+--------+           PSV cm/sEDV cm/sStenosisPlaque Description        Comments +----------+--------+--------+--------+--------------------------+--------+ CCA Prox  98      21                                                 +----------+--------+--------+--------+--------------------------+--------+ CCA Distal62      20              smooth and heterogenous            +----------+--------+--------+--------+--------------------------+--------+ ICA Prox  87      28              irregular and heterogenous         +----------+--------+--------+--------+--------------------------+--------+ ICA Mid   111     31                                                 +----------+--------+--------+--------+--------------------------+--------+ ICA Distal139     43                                                 +----------+--------+--------+--------+--------------------------+--------+ ECA       104                                                        +----------+--------+--------+--------+--------------------------+--------+ +----------+--------+--------+----------------+-------------------+  PSV cm/sEDV cm/sDescribe        Arm Pressure (mmHG) +----------+--------+--------+----------------+-------------------+ [id number]             Multiphasic, WNL                    +----------+--------+--------+----------------+-------------------+ +---------+--------+--+--------+--+---------+ VertebralPSV cm/s53EDV cm/s13Antegrade +---------+--------+--+--------+--+---------+   Summary: Right Carotid: Velocities in the right ICA are consistent with a 1-39% stenosis. Left Carotid: Velocities in the left ICA are consistent with a 1-39% stenosis. Vertebrals:  Bilateral vertebral arteries demonstrate antegrade flow. Subclavians: Normal  flow hemodynamics were seen in bilateral subclavian              arteries. *See table(s) above for measurements and observations.     Preliminary    ECHOCARDIOGRAM COMPLETE Result Date: 01/28/2024    ECHOCARDIOGRAM REPORT   Patient Name:   Dennis Zhang Date of Exam: 01/28/2024 Medical Rec #:  [id number]       Height:       74.0 in Accession #:    [id number]      Weight:       148.0 lb Date of Birth:  07/26/1955        BSA:          1.912 m Patient Age:    68 years        BP:           142/114 mmHg Patient Gender: M               HR:           96 bpm. Exam Location:  Inpatient Procedure: 2D Echo, Cardiac Doppler and Color Doppler (Both Spectral and Color            Flow Doppler were utilized during procedure). Indications:    CVA  History:        Patient has no prior history of Echocardiogram examinations.  Sonographer:    Therisa Crouch Referring Phys: [id number] ASHISH ARORA IMPRESSIONS  1. Left ventricular ejection fraction, by estimation, is 60 to 65%. The left ventricle has normal function. The left ventricle has no regional wall motion abnormalities. Left ventricular diastolic parameters were normal.  2. Right ventricular systolic function is normal. The right ventricular size is normal.  3. The mitral valve is abnormal. No evidence of mitral valve regurgitation. No evidence of mitral stenosis.  4. Sclerosis of non coronary cusp. The aortic valve is tricuspid. There is moderate calcification of the aortic valve. There is mild thickening of the aortic valve. Aortic valve regurgitation is not visualized. Aortic valve sclerosis is present, with no  evidence of aortic valve stenosis.  5. The inferior vena cava is normal in size with greater than 50% respiratory variability, suggesting right atrial pressure of 3 mmHg. FINDINGS  Left Ventricle: Left ventricular ejection fraction, by estimation, is 60 to 65%. The left ventricle has normal function. The left ventricle has no regional wall motion abnormalities. Strain was  performed and the global longitudinal strain is indeterminate. The left ventricular internal cavity size was normal in size. There is no left ventricular hypertrophy. Left ventricular diastolic parameters were normal. Right Ventricle: The right ventricular size is normal. No increase in right ventricular wall thickness. Right ventricular systolic function is normal. Left Atrium: Left atrial size was normal in size. Right Atrium: Right atrial size was normal in size. Pericardium: Trivial pericardial effusion is present. The pericardial effusion is posterior to the left ventricle. Mitral Valve: The mitral valve is abnormal. There  is mild thickening of the mitral valve leaflet(s). No evidence of mitral valve regurgitation. No evidence of mitral valve stenosis. Tricuspid Valve: The tricuspid valve is normal in structure. Tricuspid valve regurgitation is mild . No evidence of tricuspid stenosis. Aortic Valve: Sclerosis of non coronary cusp. The aortic valve is tricuspid. There is moderate calcification of the aortic valve. There is mild thickening of the aortic valve. Aortic valve regurgitation is not visualized. Aortic valve sclerosis is present, with no evidence of aortic valve stenosis. Pulmonic Valve: The pulmonic valve was normal in structure. Pulmonic valve regurgitation is not visualized. No evidence of pulmonic stenosis. Aorta: The aortic root is normal in size and structure. Venous: The inferior vena cava is normal in size with greater than 50% respiratory variability, suggesting right atrial pressure of 3 mmHg. IAS/Shunts: No atrial level shunt detected by color flow Doppler. Additional Comments: 3D was performed not requiring image post processing on an independent workstation and was indeterminate.  LEFT VENTRICLE PLAX 2D LVIDd:         2.94 cm   Diastology LVIDs:         1.74 cm   LV e' medial:    9.25 cm/s LV PW:         1.04 cm   LV E/e' medial:  7.3 LV IVS:        1.06 cm   LV e' lateral:   9.57 cm/s LVOT  diam:     2.14 cm   LV E/e' lateral: 7.1 LVOT Area:     3.60 cm  RIGHT VENTRICLE             IVC RV S prime:     12.30 cm/s  IVC diam: 1.07 cm TAPSE (M-mode): 1.8 cm LEFT ATRIUM             Index LA diam:        2.15 cm 1.12 cm/m LA Vol (A2C):   48.7 ml 25.47 ml/m LA Vol (A4C):   35.7 ml 18.67 ml/m LA Biplane Vol: 41.3 ml 21.60 ml/m   AORTA Ao Root diam: 3.48 cm Ao Asc diam:  3.44 cm MITRAL VALVE MV Area (PHT): 4.24 cm    SHUNTS MV Decel Time: 179 msec    Systemic Diam: 2.14 cm MV E velocity: 67.60 cm/s MV A velocity: 77.00 cm/s MV E/A ratio:  0.88 Maude Emmer MD Electronically signed by Maude Emmer MD Signature Date/Time: 01/28/2024/10:41:33 AM    Final      LOS: 2 days   Donalda Applebaum, MD  [facility]    To contact the attending provider between 7A-7P or the covering provider during after hours 7P-7A, please log into the web site [email or web address] and access using universal [place] password for that web site. If you do not have the password, please call the hospital operator.  01/30/2024, 9:57 AM

## 2024-01-31 DIAGNOSIS — R197 Diarrhea, unspecified: Secondary | ICD-10-CM | POA: Diagnosis not present

## 2024-01-31 DIAGNOSIS — N179 Acute kidney failure, unspecified: Secondary | ICD-10-CM | POA: Diagnosis not present

## 2024-01-31 DIAGNOSIS — I639 Cerebral infarction, unspecified: Secondary | ICD-10-CM | POA: Diagnosis not present

## 2024-01-31 DIAGNOSIS — E440 Moderate protein-calorie malnutrition: Secondary | ICD-10-CM | POA: Diagnosis not present

## 2024-01-31 LAB — CBC
HCT: 37 % — ABNORMAL LOW (ref 39.0–52.0)
Hemoglobin: 12.8 g/dL — ABNORMAL LOW (ref 13.0–17.0)
MCH: 34.6 pg — ABNORMAL HIGH (ref 26.0–34.0)
MCHC: 34.6 g/dL (ref 30.0–36.0)
MCV: 100 fL (ref 80.0–100.0)
Platelets: 118 K/uL — ABNORMAL LOW (ref 150–400)
RBC: 3.7 MIL/uL — ABNORMAL LOW (ref 4.22–5.81)
RDW: 15 % (ref 11.5–15.5)
WBC: 13.1 K/uL — ABNORMAL HIGH (ref 4.0–10.5)
nRBC: 0 % (ref 0.0–0.2)

## 2024-01-31 LAB — GLUCOSE, CAPILLARY
Glucose-Capillary: 102 mg/dL — ABNORMAL HIGH (ref 70–99)
Glucose-Capillary: 139 mg/dL — ABNORMAL HIGH (ref 70–99)
Glucose-Capillary: 120 mg/dL — ABNORMAL HIGH (ref 70–99)
Glucose-Capillary: 209 mg/dL — ABNORMAL HIGH (ref 70–99)

## 2024-01-31 LAB — BASIC METABOLIC PANEL WITH GFR
Anion gap: 13 (ref 5–15)
BUN: 10 mg/dL (ref 8–23)
CO2: 24 mmol/L (ref 22–32)
Calcium: 8.1 mg/dL — ABNORMAL LOW (ref 8.9–10.3)
Chloride: 107 mmol/L (ref 98–111)
Creatinine, Ser: 1.38 mg/dL — ABNORMAL HIGH (ref 0.61–1.24)
GFR, Estimated: 55 mL/min — ABNORMAL LOW (ref >60)
Glucose, Bld: 124 mg/dL — ABNORMAL HIGH (ref 70–99)
Potassium: 3.2 mmol/L — ABNORMAL LOW (ref 3.5–5.1)
Sodium: 144 mmol/L (ref 135–145)

## 2024-01-31 LAB — MAGNESIUM: Magnesium: 1.8 mg/dL (ref 1.7–2.4)

## 2024-01-31 LAB — PHOSPHORUS: Phosphorus: 3 mg/dL (ref 2.5–4.6)

## 2024-01-31 MED ORDER — POTASSIUM CHLORIDE CRYS ER 20 MEQ PO TBCR
40.0000 meq | EXTENDED_RELEASE_TABLET | Freq: Once | ORAL | Status: AC
  Administered 2024-01-31: 40 meq via ORAL
  Filled 2024-01-31: qty 2

## 2024-01-31 MED ORDER — MAGNESIUM SULFATE 2 GM/50ML IV SOLN
2.0000 g | Freq: Once | INTRAVENOUS | Status: AC
  Administered 2024-01-31: 2 g via INTRAVENOUS
  Filled 2024-01-31: qty 50

## 2024-01-31 NOTE — TOC Progression Note (Signed)
 Transition of Care [facility]) - Progression Note    Patient Details  Name: Dennis Zhang MRN: [id number] Date of Birth: [birth year]  Transition of Care [facility]) CM/SW Contact  [name], [place] Phone Number: 01/31/2024, 3:41 PM  Clinical Narrative:     SNF auth request submitted in online portal. Ref# [id number]  SNF shara is pending.   Expected Discharge Plan: Skilled Nursing Facility Barriers to Discharge: Continued Medical Work up, English as a second language teacher               Expected Discharge Plan and Services In-house Referral: Clinical Social Work   Post Acute Care Choice: Skilled Nursing Facility Living arrangements for the past 2 months: Apartment                                       Social Drivers of Health (SDOH) Interventions SDOH Screenings   Food Insecurity: No Food Insecurity (01/29/2024)  Housing: High Risk (01/29/2024)  Transportation Needs: No Transportation Needs (01/29/2024)  Utilities: Not At Risk (01/29/2024)  Depression (PHQ2-9): Medium Risk (09/28/2020)  Social Connections: Moderately Isolated (01/29/2024)  Tobacco Use: High Risk (01/27/2024)    Readmission Risk Interventions     No data to display

## 2024-01-31 NOTE — Care Management Important Message (Signed)
 Important Message  Patient Details  Name: Dennis Zhang MRN: [id number] Date of Birth: 08-07-1955   Important Message Given:  Yes - Medicare IM     [name] 01/31/2024, 1:20 PM

## 2024-01-31 NOTE — Progress Notes (Signed)
 PROGRESS NOTE        PATIENT DETAILS Name: Dennis Zhang Age: 68 y.o. Sex: male Date of Birth: 12/21/1955 Admit Date: 01/27/2024 Admitting Physician Pratik JONETTA Fairly, DO ERE:Ropwpr, Bonni Lien  Brief Summary: Patient is a 68 y.o.  male with history of DM-2, HTN, HLD, chronic pain-who presented with left-sided facial droop-upon further workup-found to have acute CVA.  Significant events: 9/8>> admit to [facility].  Significant studies: 9/8>> MRI brain: Acute right frontal lobe infarct.  Punctate acute right occipital lobe infarct. 9/8>> MRI brain: No significant stenosis. 9/8>> echo: EF 60-65%. 9/8>> B/L carotid Doppler: No significant stenosis 9/8>> A1c: 5.1 9/8>> LDL: 30. 9/9>> CXR: No PNA  Significant microbiology data: 9/8>> blood culture: No growth 9/9>> stool studies for C. difficile: Negative 9/9>> GI pathogen panel stool: Negative  Procedures: None  Consults: Neurology  Subjective: Few loose stools yesterday but otherwise no major issues.  He refused blood work this morning  Objective: Vitals: Blood pressure 137/87, pulse 64, temperature 98.2 F (36.8 C), temperature source Oral, resp. rate 14, SpO2 98%.   Exam: Gen Exam:Alert awake-not in any distress HEENT:atraumatic, normocephalic Chest: B/L clear to auscultation anteriorly CVS:S1S2 regular Abdomen:soft non tender, non distended Extremities:no edema Neurology: Minimal left-sided weakness. Skin: no rash  Pertinent Labs/Radiology:    Latest Ref Rng & Units 01/30/2024    3:59 AM 01/29/2024    5:48 AM 01/28/2024    3:28 AM  CBC  WBC 4.0 - 10.5 K/uL 20.4  21.6  14.0   Hemoglobin 13.0 - 17.0 g/dL 88.5  87.4  86.7   Hematocrit 39.0 - 52.0 % 32.7  35.4  38.4   Platelets 150 - 400 K/uL 116  130  180     Lab Results  Component Value Date   NA 142 01/30/2024   K 3.0 (L) 01/30/2024   CL 110 01/30/2024   CO2 21 (L) 01/30/2024      Assessment/Plan: Acute CVA Overall  improved-minimal left upper extremity weakness and left facial weakness Work up as above Stroke MD recommending aspirin /Plavix  x 3 weeks followed by Plavix  alone Needs 30-day heart monitoring arranged on discharge.  Diarrhea Much better-only few loose stools yesterday Stool studies negative As needed Imodium.  Leukocytosis Suspect secondary to diarrhea/gastroenteritis Blood cultures/stool studies negative Given overall stability-no other sources of infection apparent-watch closely. Refuse labs this morning-I have asked nursing staff to see if we can get repeat labs today-if has persistent/worsening leukocytosis-will get CT abdomen/pelvis.   AKI on CKD stage IIIa AKI hemodynamically mediated secondary to diarrhea Improved with IVF/improvement in diarrhea Follow electrolytes Avoid nephrotoxic agents  Hypokalemia/hypophosphatemia Secondary to GI losses Repeat labs this morning.  HTN BP stable Lisinopril  on hold due to AKI issues.  HLD Statin  DM-2 No further hypoglycemic episodes Encourage oral intake-follow.  Recent Labs    01/30/24 2154 01/30/24 2252 01/31/24 0748  GLUCAP 76 99 102*     Chronic pain syndrome Stable Continue Cymbalta /Neurontin   BPH Flomax  Frequent bladder scans to ensure no retention.  Nutrition Status: Nutrition Problem: Severe Malnutrition Etiology: chronic illness Signs/Symptoms: severe muscle depletion, severe fat depletion Interventions: Ensure Enlive (each supplement provides 350kcal and 20 grams of protein), Magic cup, Liberalize Diet, MVI, Education   Code status:   Code Status: Full Code   DVT Prophylaxis: heparin  injection 5,000 Units Start: 01/28/24 0600   Family Communication: None  at bedside   Disposition Plan: Status is: Inpatient Remains inpatient appropriate because: Severity of illness   Planned Discharge Destination:Rehabilitation facility   Diet: Diet Order             Diet regular Room service  appropriate? Yes with Assist; Fluid consistency: Thin  Diet effective now                     Antimicrobial agents: Anti-infectives (From admission, onward)    None        MEDICATIONS: Scheduled Meds:  aspirin  EC  81 mg Oral Daily   atorvastatin   10 mg Oral Daily   clopidogrel   75 mg Oral Daily   DULoxetine   30 mg Oral Daily   feeding supplement  237 mL Oral TID BM   folic acid   1 mg Oral Daily   heparin   5,000 Units Subcutaneous Q8H   latanoprost   1 drop Both Eyes QHS   magnesium  oxide  400 mg Oral Daily   multivitamin with minerals  1 tablet Oral Daily   potassium chloride  SA  20 mEq Oral QPC breakfast   tamsulosin   0.4 mg Oral QPC breakfast   thiamine   100 mg Oral Daily   Continuous Infusions:   PRN Meds:.albuterol , artificial tears, gabapentin , ondansetron  **OR** ondansetron  (ZOFRAN ) IV, oxyCODONE    I have personally reviewed following labs and imaging studies  LABORATORY DATA: CBC: Recent Labs  Lab 01/27/24 1809 01/27/24 1838 01/28/24 0328 01/29/24 0548 01/30/24 0359  WBC 9.8  --  14.0* 21.6* 20.4*  NEUTROABS 7.2  --   --   --   --   HGB 15.2 17.0 13.2 12.5* 11.4*  HCT 45.3 50.0 38.4* 35.4* 32.7*  MCV 101.8*  --  101.1* 98.9 99.4  PLT 193  --  180 130* 116*    Basic Metabolic Panel: Recent Labs  Lab 01/28/24 0328 01/28/24 0338 01/28/24 1509 01/28/24 2155 01/29/24 0548 01/29/24 1738 01/30/24 0359  NA  --  143 139 140 142  --  142  K  --  2.3* 2.5* 2.4* 2.4*  --  3.0*  CL  --  104 104 105 106  --  110  CO2  --  25 22 24 22   --  21*  GLUCOSE  --  111* 255* 143* 106*  --  88  BUN  --  13 13 13 12   --  11  CREATININE  --  2.07* 1.93* 1.82* 1.70*  --  1.57*  CALCIUM   --  8.7* 8.3* 8.3* 8.5*  --  8.0*  MG 1.3*  --   --   --  1.7 2.0 1.9  PHOS  --   --   --   --  1.8* 1.6* 2.4*    GFR: CrCl cannot be calculated (Unknown ideal weight.).  Liver Function Tests: Recent Labs  Lab 01/27/24 1809  AST 57*  ALT 22  ALKPHOS 196*  BILITOT  1.0  PROT 8.1  ALBUMIN 2.7*   No results for input(s): LIPASE, AMYLASE in the last 168 hours. No results for input(s): AMMONIA in the last 168 hours.  Coagulation Profile: No results for input(s): INR, PROTIME in the last 168 hours.  Cardiac Enzymes: No results for input(s): CKTOTAL, CKMB, CKMBINDEX, TROPONINI in the last 168 hours.  BNP (last 3 results) No results for input(s): PROBNP in the last 8760 hours.  Lipid Profile: No results for input(s): CHOL, HDL, LDLCALC, TRIG, CHOLHDL, LDLDIRECT in the last 72 hours.  Thyroid  Function Tests: No results for input(s): TSH, T4TOTAL, FREET4, T3FREE, THYROIDAB in the last 72 hours.  Anemia Panel: No results for input(s): VITAMINB12, FOLATE, FERRITIN, TIBC, IRON, RETICCTPCT in the last 72 hours.  Urine analysis:    Component Value Date/Time   COLORURINE YELLOW 01/29/2024 1124   APPEARANCEUR HAZY (A) 01/29/2024 1124   LABSPEC 1.005 01/29/2024 1124   PHURINE 5.0 01/29/2024 1124   GLUCOSEU NEGATIVE 01/29/2024 1124   HGBUR MODERATE (A) 01/29/2024 1124   BILIRUBINUR NEGATIVE 01/29/2024 1124   KETONESUR NEGATIVE 01/29/2024 1124   PROTEINUR NEGATIVE 01/29/2024 1124   UROBILINOGEN 1.0 06/14/2014 1847   NITRITE POSITIVE (A) 01/29/2024 1124   LEUKOCYTESUR NEGATIVE 01/29/2024 1124    Sepsis Labs: Lactic Acid, Venous    Component Value Date/Time   LATICACIDVEN 1.5 01/28/2024 0328    MICROBIOLOGY: Recent Results (from the past 240 hours)  Blood culture (routine x 2)     Status: None (Preliminary result)   Collection Time: 01/28/24  3:28 AM   Specimen: BLOOD  Result Value Ref Range Status   Specimen Description BLOOD BLOOD LEFT ARM  Final   Special Requests   Final    BOTTLES DRAWN AEROBIC AND ANAEROBIC Blood Culture results may not be optimal due to an inadequate volume of blood received in culture bottles   Culture   Final    NO GROWTH 3 DAYS Performed at [facility], [address], [place], [place] [address]    Report Status PENDING  Incomplete  Blood culture (routine x 2)     Status: None (Preliminary result)   Collection Time: 01/28/24  3:28 AM   Specimen: BLOOD  Result Value Ref Range Status   Specimen Description BLOOD BLOOD RIGHT ARM  Final   Special Requests   Final    BOTTLES DRAWN AEROBIC ONLY Blood Culture results may not be optimal due to an inadequate volume of blood received in culture bottles   Culture   Final    NO GROWTH 3 DAYS Performed at [facility], [address] [place], [place] [address]    Report Status PENDING  Incomplete  C Difficile Quick Screen w PCR reflex     Status: None   Collection Time: 01/29/24  8:23 AM   Specimen: STOOL  Result Value Ref Range Status   C Diff antigen NEGATIVE NEGATIVE Final   C Diff toxin NEGATIVE NEGATIVE Final   C Diff interpretation No C. difficile detected.  Final    Comment: Performed at [facility], [address], [place], [place] [address]  Gastrointestinal Panel by PCR , Stool     Status: None   Collection Time: 01/29/24  8:24 AM   Specimen: STOOL  Result Value Ref Range Status   Campylobacter species NOT DETECTED NOT DETECTED Final   Plesimonas shigelloides NOT DETECTED NOT DETECTED Final   Salmonella species NOT DETECTED NOT DETECTED Final   Yersinia enterocolitica NOT DETECTED NOT DETECTED Final   Vibrio species NOT DETECTED NOT DETECTED Final   Vibrio cholerae NOT DETECTED NOT DETECTED Final   Enteroaggregative E coli (EAEC) NOT DETECTED NOT DETECTED Final   Enteropathogenic E coli (EPEC) NOT DETECTED NOT DETECTED Final   Enterotoxigenic E coli (ETEC) NOT DETECTED NOT DETECTED Final   Shiga like toxin producing E coli (STEC) NOT DETECTED NOT DETECTED Final   Shigella/Enteroinvasive E coli (EIEC) NOT DETECTED NOT DETECTED Final   Cryptosporidium NOT DETECTED NOT DETECTED Final   Cyclospora cayetanensis NOT  DETECTED NOT DETECTED Final   Entamoeba  histolytica NOT DETECTED NOT DETECTED Final   Giardia lamblia NOT DETECTED NOT DETECTED Final   Adenovirus F40/41 NOT DETECTED NOT DETECTED Final   Astrovirus NOT DETECTED NOT DETECTED Final   Norovirus GI/GII NOT DETECTED NOT DETECTED Final   Rotavirus A NOT DETECTED NOT DETECTED Final   Sapovirus (I, II, IV, and V) NOT DETECTED NOT DETECTED Final    Comment: Performed at [facility], [address], [place], [place] [address]    RADIOLOGY STUDIES/RESULTS: No results found.    LOS: 3 days   Donalda Applebaum, MD  [facility]    To contact the attending provider between 7A-7P or the covering provider during after hours 7P-7A, please log into the web site [email or web address] and access using universal [place] password for that web site. If you do not have the password, please call the hospital operator.  01/31/2024, 10:36 AM

## 2024-01-31 NOTE — Progress Notes (Signed)
 PT Cancellation Note  Patient Details Name: Dennis Zhang MRN: [id number] DOB: 27-Nov-1955   Cancelled Treatment:    Reason Eval/Treat Not Completed: Patient declined, no reason specified (Pt eating lunch and politely requests PT to come back later once he finished eating. Will follow up later.)   Tariya Morrissette 01/31/2024, 1:43 PM

## 2024-01-31 NOTE — Plan of Care (Signed)
 Per chart review patient's most recent A1c 5.1 in 01/2024 And previous A1c 5.2 in 09/2012. Not sure if patient is really diabetic in the past or not however patient does not meet the for diabetes anymore.  Patient's RN reported that blood glucose elevated to 1.  Given patient is not diabetic anymore there is no need for sliding scale insulin  at this time.

## 2024-01-31 NOTE — Plan of Care (Signed)
  Problem: Education: Goal: Knowledge of disease or condition will improve Outcome: Not Progressing Goal: Knowledge of secondary prevention will improve (MUST DOCUMENT ALL) Outcome: Not Progressing Goal: Knowledge of patient specific risk factors will improve (DELETE if not current risk factor) Outcome: Not Progressing   Problem: Ischemic Stroke/TIA Tissue Perfusion: Goal: Complications of ischemic stroke/TIA will be minimized Outcome: Not Progressing   Problem: Coping: Goal: Will verbalize positive feelings about self Outcome: Not Progressing Goal: Will identify appropriate support needs Outcome: Not Progressing   Problem: Self-Care: Goal: Ability to participate in self-care as condition permits will improve Outcome: Not Progressing Goal: Verbalization of feelings and concerns over difficulty with self-care will improve Outcome: Not Progressing Goal: Ability to communicate needs accurately will improve Outcome: Not Progressing

## 2024-01-31 NOTE — Progress Notes (Signed)
 Physical Therapy Treatment Patient Details Name: Dennis Zhang MRN: [id number] DOB: [birth year] Today's Date: 01/31/2024   History of Present Illness Pt is 68 yo presenting to [facility] on 9/7 due to L sided facial droop and slurred speech. Mri with findings of R frontal and occipital acute ischemic CVA. PMH: chrnoic pain, depression, DM II, dyslipidemia, HTN, CKD.    PT Comments  Pt tolerated treatment well today. Pt today was able to ambulate in hallway with RW Min A progressing to CGA. Patient will benefit from continued inpatient follow up therapy, <3 hours/day. PT will continue to follow.     If plan is discharge home, recommend the following: A little help with walking and/or transfers;Assistance with cooking/housework;Assist for transportation   Can travel by private vehicle     No  Equipment Recommendations  Wheelchair (measurements PT);Wheelchair cushion (measurements PT)    Recommendations for Other Services       Precautions / Restrictions Precautions Precautions: Fall Recall of Precautions/Restrictions: Impaired Restrictions Weight Bearing Restrictions Per Provider Order: No     Mobility  Bed Mobility Overal bed mobility: Needs Assistance Bed Mobility: Supine to Sit, Sit to Supine     Supine to sit: Mod assist Sit to supine: Contact guard assist   General bed mobility comments: Mod A for trunk elevation and BLE management    Transfers Overall transfer level: Needs assistance Equipment used: Rolling walker (2 wheels) Transfers: Sit to/from Stand Sit to Stand: Min assist           General transfer comment: Cues for hand placement    Ambulation/Gait Ambulation/Gait assistance: Min assist, Contact guard assist Gait Distance (Feet): 150 Feet Assistive device: Rolling walker (2 wheels) Gait Pattern/deviations: Step-through pattern, Narrow base of support, Decreased step length - right, Decreased step length - left Gait velocity: decreased     General Gait  Details: Initially Min A to guide RW however pt able to progress to CGA. Constant cues for proximity and safety with RW.   Stairs             Wheelchair Mobility     Tilt Bed    Modified Rankin (Stroke Patients Only)       Balance Overall balance assessment: Needs assistance Sitting-balance support: Feet supported Sitting balance-Leahy Scale: Fair     Standing balance support: Bilateral upper extremity supported, During functional activity Standing balance-Leahy Scale: Poor Standing balance comment: RW                            Communication Communication Communication: No apparent difficulties  Cognition Arousal: Alert Behavior During Therapy: Flat affect   PT - Cognitive impairments: No apparent impairments                       PT - Cognition Comments: Some slow processing noted. Following commands: Impaired Following commands impaired: Only follows one step commands consistently, Follows multi-step commands inconsistently    Cueing Cueing Techniques: Verbal cues, Tactile cues  Exercises      General Comments General comments (skin integrity, edema, etc.): VSS      Pertinent Vitals/Pain Pain Assessment Pain Assessment: No/denies pain    Home Living                          Prior Function            PT Goals (current goals can now be found  in the care plan section) Progress towards PT goals: Progressing toward goals    Frequency    Min 3X/week      PT Plan      Co-evaluation              AM-PAC PT 6 Clicks Mobility   Outcome Measure  Help needed turning from your back to your side while in a flat bed without using bedrails?: A Little Help needed moving from lying on your back to sitting on the side of a flat bed without using bedrails?: A Little Help needed moving to and from a bed to a chair (including a wheelchair)?: A Little Help needed standing up from a chair using your arms (e.g.,  wheelchair or bedside chair)?: A Little Help needed to walk in hospital room?: A Little Help needed climbing 3-5 steps with a railing? : Total 6 Click Score: 16    End of Session Equipment Utilized During Treatment: Gait belt Activity Tolerance: Patient tolerated treatment well Patient left: in bed;with call bell/phone within reach Nurse Communication: Mobility status PT Visit Diagnosis: Unsteadiness on feet (R26.81);Other abnormalities of gait and mobility (R26.89);Repeated falls (R29.6);Muscle weakness (generalized) (M62.81)     Time: 8579-8556 PT Time Calculation (min) (ACUTE ONLY): 23 min  Charges:    $Gait Training: 8-22 mins $Therapeutic Activity: 8-22 mins PT General Charges $$ ACUTE PT VISIT: 1 Visit                     Sueellen NOVAK, PT, DPT Acute Rehab Services [phone]    Dennis Zhang 01/31/2024, 3:11 PM

## 2024-01-31 NOTE — Plan of Care (Signed)

## 2024-01-31 NOTE — TOC Progression Note (Addendum)
 Transition of Care [facility]) - Progression Note    Patient Details  Name: Dennis Zhang MRN: [id number] Date of Birth: 1956-04-13  [facility] CM/SW Contact  Inocente GORMAN Kindle, LCSW Phone Number: 01/31/2024, 12:24 PM  Clinical Narrative:    CSW updated [facility] that patient is likely ready tomorrow. CSW can begin insurance process once patient is seen by therapy today.  CSW provided update to patient's sister who will call and encourage patient to work with therapy today. She requests a call at time of discharge.   Expected Discharge Plan: Skilled Nursing Facility Barriers to Discharge: Continued Medical Work up, English as a second language teacher               Expected Discharge Plan and Services In-house Referral: Clinical Social Work   Post Acute Care Choice: Skilled Nursing Facility Living arrangements for the past 2 months: Apartment                                       Social Drivers of Health (SDOH) Interventions SDOH Screenings   Food Insecurity: No Food Insecurity (01/29/2024)  Housing: High Risk (01/29/2024)  Transportation Needs: No Transportation Needs (01/29/2024)  Utilities: Not At Risk (01/29/2024)  Depression (PHQ2-9): Medium Risk (09/28/2020)  Social Connections: Moderately Isolated (01/29/2024)  Tobacco Use: High Risk (01/27/2024)    Readmission Risk Interventions     No data to display

## 2024-01-31 NOTE — Progress Notes (Signed)
 Patient refusing morning labs. Notified Subrina Sundil, MD.

## 2024-02-01 ENCOUNTER — Other Ambulatory Visit: Payer: Self-pay | Admitting: Physician Assistant

## 2024-02-01 DIAGNOSIS — I639 Cerebral infarction, unspecified: Secondary | ICD-10-CM | POA: Diagnosis not present

## 2024-02-01 DIAGNOSIS — R197 Diarrhea, unspecified: Secondary | ICD-10-CM | POA: Diagnosis not present

## 2024-02-01 DIAGNOSIS — N179 Acute kidney failure, unspecified: Secondary | ICD-10-CM | POA: Diagnosis not present

## 2024-02-01 DIAGNOSIS — E876 Hypokalemia: Secondary | ICD-10-CM | POA: Diagnosis not present

## 2024-02-01 LAB — BASIC METABOLIC PANEL WITH GFR
Anion gap: 12 (ref 5–15)
BUN: 9 mg/dL (ref 8–23)
CO2: 23 mmol/L (ref 22–32)
Calcium: 7.9 mg/dL — ABNORMAL LOW (ref 8.9–10.3)
Chloride: 108 mmol/L (ref 98–111)
Creatinine, Ser: 1.46 mg/dL — ABNORMAL HIGH (ref 0.61–1.24)
GFR, Estimated: 52 mL/min — ABNORMAL LOW (ref >60)
Glucose, Bld: 114 mg/dL — ABNORMAL HIGH (ref 70–99)
Potassium: 3.3 mmol/L — ABNORMAL LOW (ref 3.5–5.1)
Sodium: 143 mmol/L (ref 135–145)

## 2024-02-01 LAB — GLUCOSE, CAPILLARY
Glucose-Capillary: 107 mg/dL — ABNORMAL HIGH (ref 70–99)
Glucose-Capillary: 169 mg/dL — ABNORMAL HIGH (ref 70–99)
Glucose-Capillary: 75 mg/dL (ref 70–99)

## 2024-02-01 LAB — MAGNESIUM: Magnesium: 1.8 mg/dL (ref 1.7–2.4)

## 2024-02-01 MED ORDER — POTASSIUM CHLORIDE CRYS ER 20 MEQ PO TBCR
40.0000 meq | EXTENDED_RELEASE_TABLET | Freq: Four times a day (QID) | ORAL | Status: DC
  Administered 2024-02-01: 40 meq via ORAL
  Filled 2024-02-01: qty 2

## 2024-02-01 MED ORDER — AMLODIPINE BESYLATE 5 MG PO TABS
5.0000 mg | ORAL_TABLET | Freq: Every day | ORAL | Status: DC
  Administered 2024-02-01: 5 mg via ORAL
  Filled 2024-02-01: qty 1

## 2024-02-01 MED ORDER — ASPIRIN 81 MG PO TBEC: 81.0000 mg | DELAYED_RELEASE_TABLET | Freq: Every day | ORAL | Status: AC

## 2024-02-01 MED ORDER — AMLODIPINE BESYLATE 5 MG PO TABS: 5.0000 mg | ORAL_TABLET | Freq: Every day | ORAL | Status: DC

## 2024-02-01 MED ORDER — GABAPENTIN 300 MG PO CAPS
300.0000 mg | ORAL_CAPSULE | Freq: Four times a day (QID) | ORAL | Status: DC | PRN
Start: 2024-02-01 — End: 2024-03-04

## 2024-02-01 MED ORDER — CLOPIDOGREL BISULFATE 75 MG PO TABS: 75.0000 mg | ORAL_TABLET | Freq: Every day | ORAL | Status: DC

## 2024-02-01 NOTE — Progress Notes (Signed)
 Report given to [facility].

## 2024-02-01 NOTE — Discharge Summary (Signed)
 PATIENT DETAILS Name: Dennis Zhang Age: 68 y.o. Sex: male Date of Birth: 08-19-1955 MRN: [id number]. Admitting Physician: Adron JONETTA Fairly, DO ERE:Ropwpr, Bonni Lien  Admit Date: 01/27/2024 Discharge date: 02/01/2024  Recommendations for Outpatient Follow-up:  Follow up with PCP in 1-2 weeks Please obtain CMP/CBC in one week Ensure follow-up with stroke clinic Ensure follow-up with cardiology for 30-day cardiac monitor.  Admitted From:  Home  Disposition: Skilled nursing facility   Discharge Condition: good  CODE STATUS:   Code Status: Full Code   Diet recommendation:  Diet Order             Diet - low sodium heart healthy           Diet Carb Modified           Diet regular Room service appropriate? Yes with Assist; Fluid consistency: Thin  Diet effective now                    Brief Summary: Patient is a 68 y.o.  male with history of DM-2, HTN, HLD, chronic pain-who presented with left-sided facial droop-upon further workup-found to have acute CVA.   Significant events: 9/8>> admit to [facility].   Significant studies: 9/8>> MRI brain: Acute right frontal lobe infarct.  Punctate acute right occipital lobe infarct. 9/8>> MRI brain: No significant stenosis. 9/8>> echo: EF 60-65%. 9/8>> B/L carotid Doppler: No significant stenosis 9/8>> A1c: 5.1 9/8>> LDL: 30. 9/9>> CXR: No PNA   Significant microbiology data: 9/8>> blood culture: No growth 9/9>> stool studies for C. difficile: Negative 9/9>> GI pathogen panel stool: Negative   Procedures: None   Consults: Neurology  Brief Hospital Course: Acute CVA Overall improved-minimal left upper extremity weakness and left facial weakness Work up as above Stroke MD recommending aspirin /Plavix  x 3 weeks followed by Plavix  alone Neurology recommending 30-day heart monitoring arranged on discharge.  Epic message sent to cardiology this morning-they will arrange follow-up.   Diarrhea Essentially  resolved Stool studies negative As needed Imodium.   Leukocytosis Suspect secondary to diarrhea/gastroenteritis Blood cultures/stool studies negative Thankfully CBC downtrending at this point-continue to monitor off antibiotics.  No need to pursue CT abdomen given clinical stability-benign abdominal exam and downtrending leukocytosis.   AKI on CKD stage IIIa AKI hemodynamically mediated secondary to diarrhea Improved with IVF/improvement in diarrhea Follow electrolytes Avoid nephrotoxic agents   Hypokalemia Continues to have mild hypokalemia which will be repleted today. Repeat electrolytes   HTN BP stable but slowly creeping up Continue to hold lisinopril -start low-dose amlodipine .    HLD Statin   DM-2 No further hypoglycemic episodes Encourage oral intake-follow. CBG stable overnight. Not on any medications given low A1c  Chronic pain syndrome Stable Continue Cymbalta /Neurontin    BPH Flomax    Nutrition Status: Nutrition Problem: Severe Malnutrition Etiology: chronic illness Signs/Symptoms: severe muscle depletion, severe fat depletion Interventions: Ensure Enlive (each supplement provides 350kcal and 20 grams of protein), Magic cup, Liberalize Diet, MVI, Education   Discharge Diagnoses:  Principal Problem:   CVA (cerebral vascular accident) (HCC) Active Problems:   Hypokalemia   Malnutrition of moderate degree (HCC)   Acute kidney injury (HCC)   Seizure (HCC)   Acute CVA (cerebrovascular accident) [facility])   Discharge Instructions:  Activity:  As tolerated with Full fall precautions use walker/cane & assistance as needed   Discharge Instructions     Ambulatory referral to Neurology   Complete by: As directed    An appointment is requested in approximately: 4 weeks  Call MD for:  extreme fatigue   Complete by: As directed    Call MD for:  persistant dizziness or light-headedness   Complete by: As directed    Diet - low sodium heart healthy    Complete by: As directed    Diet Carb Modified   Complete by: As directed    Discharge instructions   Complete by: As directed    Follow with Primary MD  [facility] in 1-2 weeks  Please get a complete blood count and chemistry panel checked by your Primary MD at your next visit, and again as instructed by your Primary MD.  Get Medicines reviewed and adjusted: Please take all your medications with you for your next visit with your Primary MD  Laboratory/radiological data: Please request your Primary MD to go over all hospital tests and procedure/radiological results at the follow up, please ask your Primary MD to get all Hospital records sent to his/her office.  In some cases, they will be blood work, cultures and biopsy results pending at the time of your discharge. Please request that your primary care M.D. follows up on these results.  Also Note the following: If you experience worsening of your admission symptoms, develop shortness of breath, life threatening emergency, suicidal or homicidal thoughts you must seek medical attention immediately by calling 911 or calling your MD immediately  if symptoms less severe.  You must read complete instructions/literature along with all the possible adverse reactions/side effects for all the Medicines you take and that have been prescribed to you. Take any new Medicines after you have completely understood and accpet all the possible adverse reactions/side effects.   Do not drive when taking Pain medications or sleeping medications (Benzodaizepines)  Do not take more than prescribed Pain, Sleep and Anxiety Medications. It is not advisable to combine anxiety,sleep and pain medications without talking with your primary care practitioner  Special Instructions: If you have smoked or chewed Tobacco  in the last 2 yrs please stop smoking, stop any regular Alcohol   and or any Recreational drug use.  Wear Seat belts while driving.  Please  note: You were cared for by a hospitalist during your hospital stay. Once you are discharged, your primary care physician will handle any further medical issues. Please note that NO REFILLS for any discharge medications will be authorized once you are discharged, as it is imperative that you return to your primary care physician (or establish a relationship with a primary care physician if you do not have one) for your post hospital discharge needs so that they can reassess your need for medications and monitor your lab values.   Increase activity slowly   Complete by: As directed       Allergies as of 02/01/2024       Reactions   Ace Inhibitors    Kidney injury 05/2014-do not Rx   Aspirin  Nausea Only, Other (See Comments)   Reaction to Bayer aspirin  - causes acid reflux and nausea   Metformin Other (See Comments)   Other reaction(s): Abdominal pain   Methocarbamol Nausea And Vomiting   Tylenol  [acetaminophen ] Nausea And Vomiting   States can take Tylenol  if has other pain med w/it - like Hydrocodone         Medication List     STOP taking these medications    lisinopril  2.5 MG tablet Commonly known as: ZESTRIL        TAKE these medications    albuterol  108 (90 Base) MCG/ACT inhaler  Commonly known as: VENTOLIN  HFA Inhale 2 puffs into the lungs every 6 (six) hours as needed for wheezing or shortness of breath.   amLODipine  5 MG tablet Commonly known as: NORVASC  Take 1 tablet (5 mg total) by mouth daily.   artificial tears ophthalmic solution Place 1 drop into both eyes as needed for dry eyes.   aspirin  EC 81 MG tablet Take 1 tablet (81 mg total) by mouth daily for 21 days. Swallow whole.   atorvastatin  10 MG tablet Commonly known as: LIPITOR Take 10 mg by mouth daily.   Cholecalciferol 50 MCG (2000 UT) Tabs Take 2,000 Units by mouth every morning.   clopidogrel  75 MG tablet Commonly known as: PLAVIX  Take 1 tablet (75 mg total) by mouth daily. Start taking on:  February 02, 2024   DULoxetine  30 MG capsule Commonly known as: CYMBALTA  Take 30 mg by mouth daily. For pain and mood stabilization   feeding supplement (ENSURE COMPLETE) Liqd Take 237 mLs by mouth 3 (three) times daily between meals.   folic acid  1 MG tablet Commonly known as: FOLVITE  Take 1 tablet (1 mg total) by mouth daily.   gabapentin  300 MG capsule Commonly known as: Neurontin  Take 1 capsule (300 mg total) by mouth 4 (four) times daily as needed (for nerve pain.).   latanoprost  0.005 % ophthalmic solution Commonly known as: XALATAN  Place 1 drop into both eyes at bedtime.   Magnesium  Oxide 420 MG Tabs Take 420 mg by mouth daily.   multivitamin with minerals Tabs tablet Take 1 tablet by mouth daily.   potassium chloride  SA 20 MEQ tablet Commonly known as: KLOR-CON  M Take 20 mEq by mouth daily after breakfast.   sildenafil 100 MG tablet Commonly known as: VIAGRA Take 50 mg by mouth daily as needed for erectile dysfunction.   tamsulosin  0.4 MG Caps capsule Commonly known as: Flomax  Take 1 capsule (0.4 mg total) by mouth daily after breakfast.   thiamine  100 MG tablet Commonly known as: VITAMIN B1 Take 1 tablet (100 mg total) by mouth daily.        Contact information for follow-up providers     [facility] Schedule an appointment as soon as possible for a visit in 1 week(s).   Contact information: [address] [facility] [place] [address] [phone]         [facility] Follow up.   Specialty: Neurology Why: Office will call with date/time, If you dont hear from them,please give them a call Contact information: [address] [place]  [address] [phone]             Contact information for after-discharge care     Destination     [facility] [place], [place] .   Service: Skilled Nursing Contact information: [address] [place]  [place] [address] [phone]                    Allergies  Allergen Reactions   Ace Inhibitors     Kidney injury 05/2014-do not Rx   Aspirin  Nausea Only and Other (See Comments)    Reaction to Bayer aspirin  - causes acid reflux and nausea   Metformin Other (See Comments)    Other reaction(s): Abdominal pain   Methocarbamol Nausea And Vomiting   Tylenol  [Acetaminophen ] Nausea And Vomiting    States can take Tylenol  if has other pain med w/it - like Hydrocodone       Other Procedures/Studies: VAS US   CAROTID Result Date: 01/31/2024 Carotid Arterial Duplex Study Patient Name:  Dennis Zhang  Date of Exam:   01/28/2024 Medical Rec #: [id number]        Accession #:    [id number] Date of Birth: [birth year]         Patient Gender: M Patient Age:   89 years Exam Location:  [facility] Procedure:      VAS US  CAROTID Referring Phys: ELIGIO LAV --------------------------------------------------------------------------------  Indications:  CVA. Risk Factors: Hypertension, hyperlipidemia, Diabetes, current smoker. Performing Technologist: Elmarie Lindau, RVT  Examination Guidelines: A complete evaluation includes B-mode imaging, spectral Doppler, color Doppler, and power Doppler as needed of all accessible portions of each vessel. Bilateral testing is considered an integral part of a complete examination. Limited examinations for reoccurring indications may be performed as noted.  Right Carotid Findings: +----------+-------+-------+--------+---------------------------------+--------+           PSV    EDV    StenosisPlaque Description               Comments           cm/s   cm/s                                                     +----------+-------+-------+--------+---------------------------------+--------+ CCA Prox  76     11                                                       +----------+-------+-------+--------+---------------------------------+--------+ CCA Distal60      14             irregular and heterogenous                +----------+-------+-------+--------+---------------------------------+--------+ ICA Prox  118    40     1-39%   irregular, calcific and                                                   heterogenous                              +----------+-------+-------+--------+---------------------------------+--------+ ICA Mid   126    41                                                       +----------+-------+-------+--------+---------------------------------+--------+ ICA Distal100    35                                                       +----------+-------+-------+--------+---------------------------------+--------+ ECA       81                                                              +----------+-------+-------+--------+---------------------------------+--------+ +----------+--------+-------+----------------+-------------------+  PSV cm/sEDV cmsDescribe        Arm Pressure (mmHG) +----------+--------+-------+----------------+-------------------+ Subclavian118            Multiphasic, WNL                    +----------+--------+-------+----------------+-------------------+ +---------+--------+--+--------+--+---------+ VertebralPSV cm/s73EDV cm/s16Antegrade +---------+--------+--+--------+--+---------+  Left Carotid Findings: +----------+--------+--------+--------+--------------------------+--------+           PSV cm/sEDV cm/sStenosisPlaque Description        Comments +----------+--------+--------+--------+--------------------------+--------+ CCA Prox  98      21                                                 +----------+--------+--------+--------+--------------------------+--------+ CCA Distal62      20              smooth and heterogenous            +----------+--------+--------+--------+--------------------------+--------+ ICA Prox  87      28               irregular and heterogenous         +----------+--------+--------+--------+--------------------------+--------+ ICA Mid   111     31                                                 +----------+--------+--------+--------+--------------------------+--------+ ICA Distal139     43                                                 +----------+--------+--------+--------+--------------------------+--------+ ECA       104                                                        +----------+--------+--------+--------+--------------------------+--------+ +----------+--------+--------+----------------+-------------------+           PSV cm/sEDV cm/sDescribe        Arm Pressure (mmHG) +----------+--------+--------+----------------+-------------------+ [id number]             Multiphasic, WNL                    +----------+--------+--------+----------------+-------------------+ +---------+--------+--+--------+--+---------+ VertebralPSV cm/s53EDV cm/s13Antegrade +---------+--------+--+--------+--+---------+   Summary: Right Carotid: Velocities in the right ICA are consistent with a 1-39% stenosis. Left Carotid: Velocities in the left ICA are consistent with a 1-39% stenosis. Vertebrals:  Bilateral vertebral arteries demonstrate antegrade flow. Subclavians: Normal flow hemodynamics were seen in bilateral subclavian              arteries. *See table(s) above for measurements and observations.  Electronically signed by Eather Popp MD on 01/31/2024 at 10:20:25 AM.    Final    DG Chest Port 1 View Result Date: 01/29/2024 CLINICAL DATA:  Leukocytosis. EXAM: PORTABLE CHEST 1 VIEW COMPARISON:  Chest CT 03/14/2023. Radiographs 03/14/2023 and 07/28/2017. FINDINGS: 0858 hours. The heart size and mediastinal contours are stable with aortic atherosclerosis. The lungs appear clear. There are underlying emphysematous changes with overlying skin folds bilaterally. No evidence of pneumothorax  or  significant pleural effusion. The bones appear unchanged. IMPRESSION: No evidence of acute cardiopulmonary process. Emphysema. Electronically Signed   By: Elsie Perone M.D.   On: 01/29/2024 09:57   ECHOCARDIOGRAM COMPLETE Result Date: 01/28/2024    ECHOCARDIOGRAM REPORT   Patient Name:   Dennis Zhang Date of Exam: 01/28/2024 Medical Rec #:  [id number]       Height:       74.0 in Accession #:    [id number]      Weight:       148.0 lb Date of Birth:  March 13, 1956        BSA:          1.912 m Patient Age:    68 years        BP:           142/114 mmHg Patient Gender: M               HR:           96 bpm. Exam Location:  Inpatient Procedure: 2D Echo, Cardiac Doppler and Color Doppler (Both Spectral and Color            Flow Doppler were utilized during procedure). Indications:    CVA  History:        Patient has no prior history of Echocardiogram examinations.  Sonographer:    Therisa Crouch Referring Phys: [id number] ASHISH ARORA IMPRESSIONS  1. Left ventricular ejection fraction, by estimation, is 60 to 65%. The left ventricle has normal function. The left ventricle has no regional wall motion abnormalities. Left ventricular diastolic parameters were normal.  2. Right ventricular systolic function is normal. The right ventricular size is normal.  3. The mitral valve is abnormal. No evidence of mitral valve regurgitation. No evidence of mitral stenosis.  4. Sclerosis of non coronary cusp. The aortic valve is tricuspid. There is moderate calcification of the aortic valve. There is mild thickening of the aortic valve. Aortic valve regurgitation is not visualized. Aortic valve sclerosis is present, with no  evidence of aortic valve stenosis.  5. The inferior vena cava is normal in size with greater than 50% respiratory variability, suggesting right atrial pressure of 3 mmHg. FINDINGS  Left Ventricle: Left ventricular ejection fraction, by estimation, is 60 to 65%. The left ventricle has normal function. The left ventricle has  no regional wall motion abnormalities. Strain was performed and the global longitudinal strain is indeterminate. The left ventricular internal cavity size was normal in size. There is no left ventricular hypertrophy. Left ventricular diastolic parameters were normal. Right Ventricle: The right ventricular size is normal. No increase in right ventricular wall thickness. Right ventricular systolic function is normal. Left Atrium: Left atrial size was normal in size. Right Atrium: Right atrial size was normal in size. Pericardium: Trivial pericardial effusion is present. The pericardial effusion is posterior to the left ventricle. Mitral Valve: The mitral valve is abnormal. There is mild thickening of the mitral valve leaflet(s). No evidence of mitral valve regurgitation. No evidence of mitral valve stenosis. Tricuspid Valve: The tricuspid valve is normal in structure. Tricuspid valve regurgitation is mild . No evidence of tricuspid stenosis. Aortic Valve: Sclerosis of non coronary cusp. The aortic valve is tricuspid. There is moderate calcification of the aortic valve. There is mild thickening of the aortic valve. Aortic valve regurgitation is not visualized. Aortic valve sclerosis is present, with no evidence of aortic valve stenosis. Pulmonic Valve: The pulmonic valve was normal in structure. Pulmonic valve  regurgitation is not visualized. No evidence of pulmonic stenosis. Aorta: The aortic root is normal in size and structure. Venous: The inferior vena cava is normal in size with greater than 50% respiratory variability, suggesting right atrial pressure of 3 mmHg. IAS/Shunts: No atrial level shunt detected by color flow Doppler. Additional Comments: 3D was performed not requiring image post processing on an independent workstation and was indeterminate.  LEFT VENTRICLE PLAX 2D LVIDd:         2.94 cm   Diastology LVIDs:         1.74 cm   LV e' medial:    9.25 cm/s LV PW:         1.04 cm   LV E/e' medial:  7.3 LV  IVS:        1.06 cm   LV e' lateral:   9.57 cm/s LVOT diam:     2.14 cm   LV E/e' lateral: 7.1 LVOT Area:     3.60 cm  RIGHT VENTRICLE             IVC RV S prime:     12.30 cm/s  IVC diam: 1.07 cm TAPSE (M-mode): 1.8 cm LEFT ATRIUM             Index LA diam:        2.15 cm 1.12 cm/m LA Vol (A2C):   48.7 ml 25.47 ml/m LA Vol (A4C):   35.7 ml 18.67 ml/m LA Biplane Vol: 41.3 ml 21.60 ml/m   AORTA Ao Root diam: 3.48 cm Ao Asc diam:  3.44 cm MITRAL VALVE MV Area (PHT): 4.24 cm    SHUNTS MV Decel Time: 179 msec    Systemic Diam: 2.14 cm MV E velocity: 67.60 cm/s MV A velocity: 77.00 cm/s MV E/A ratio:  0.88 Maude Emmer MD Electronically signed by Maude Emmer MD Signature Date/Time: 01/28/2024/10:41:33 AM    Final    MR ANGIO HEAD WO CONTRAST Result Date: 01/28/2024 EXAM: MR Angiography Head without intravenous Contrast. 01/28/2024 05:00:53 AM TECHNIQUE: Magnetic resonance angiography images of the head without intravenous contrast. Multiplanar 2D and 3D reformatted images are provided for review. COMPARISON: None provided. CLINICAL HISTORY: Stroke/TIA, determine embolic source. FINDINGS: ANTERIOR CIRCULATION: No significant stenosis of the internal carotid arteries. No significant stenosis of the anterior cerebral arteries. No significant stenosis of the middle cerebral arteries. No aneurysm. 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 Fenestration of the basilar artery. No significant stenosis of the posterior cerebral arteries. No significant stenosis of the basilar artery. No significant stenosis of the vertebral arteries. No aneurysm. IMPRESSION: 1. No significant stenosis of the intracranial vasculature. 2. 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 Electronically signed by: Evalene Coho MD 01/28/2024 05:14 AM EDT RP Workstation: [id number]   MR BRAIN WO CONTRAST Result Date: 01/28/2024 CLINICAL DATA:  Neuro  deficit, acute, stroke suspected EXAM: MRI HEAD WITHOUT CONTRAST TECHNIQUE: Multiplanar, multiecho pulse sequences of the brain and surrounding structures were obtained without intravenous contrast. COMPARISON:  CT head 01/27/2024. 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 Mild edema. Punctate acute or early subacute infarct in the right occipital lobe. No mass effect. Patchy white matter T2 hyperintensities, compatible with chronic microvascular ischemic disease. Cerebral atrophy. No evidence of acute hemorrhage, mass lesion, less shift or hydrocephalus. Vascular: Normal flow voids. Skull and upper cervical spine: Normal marrow signal. Sinuses/Orbits: Negative. Other: No mastoid effusions. IMPRESSION: 1. Small acute right  pericallosal frontal lobe infarct. 2. Punctate acute or subacute infarct in the right occipital lobe. Electronically Signed   By: Gilmore GORMAN Molt M.D.   On: 01/28/2024 02:49   CT Cervical Spine Wo Contrast Result Date: 01/27/2024 CLINICAL DATA:  Neck trauma EXAM: CT CERVICAL SPINE WITHOUT CONTRAST TECHNIQUE: Multidetector CT imaging of the cervical spine was performed without intravenous contrast. Multiplanar CT image reconstructions were also generated. RADIATION DOSE REDUCTION: This exam was performed according to the departmental dose-optimization program which includes automated exposure control, adjustment of the mA and/or kV according to patient size and/or use of iterative reconstruction technique. COMPARISON:  08/03/2023 FINDINGS: Alignment: Normal Skull base and vertebrae: No acute fracture. No primary bone lesion or focal pathologic process. Soft tissues and spinal canal: No prevertebral fluid or swelling. No visible canal hematoma. Disc levels: Advanced multi level degenerative disc and facet disease. Upper chest: No acute findings.  Emphysema. Other: None IMPRESSION: Advanced multilevel degenerative changes. No acute bony abnormality. Emphysema.  Electronically Signed   By: Franky Crease M.D.   On: 01/27/2024 22:17   CT HEAD WO CONTRAST Result Date: 01/27/2024 CLINICAL DATA:  Possible TIA EXAM: CT HEAD WITHOUT CONTRAST TECHNIQUE: Contiguous axial images were obtained from the base of the skull through the vertex without intravenous contrast. RADIATION DOSE REDUCTION: This exam was performed according to the departmental dose-optimization program which includes automated exposure control, adjustment of the mA and/or kV according to patient size and/or use of iterative reconstruction technique. COMPARISON:  08/03/2023 FINDINGS: Brain: There is atrophy and chronic small vessel disease changes. No acute intracranial abnormality. Specifically, no hemorrhage, hydrocephalus, mass lesion, acute infarction, or significant intracranial injury. Vascular: No hyperdense vessel or unexpected calcification. Skull: No acute calvarial abnormality. Sinuses/Orbits: No acute findings Other: None IMPRESSION: Atrophy, chronic microvascular disease. No acute intracranial abnormality. Electronically Signed   By: Franky Crease M.D.   On: 01/27/2024 22:14     TODAY-DAY OF DISCHARGE:  Subjective:   Dennis Zhang today has no headache,no chest abdominal pain,no new weakness tingling or numbness, feels much better wants to go home today.   Objective:   Blood pressure (!) 173/107, pulse 75, temperature 97.8 F (36.6 C), temperature source Oral, resp. rate 15, SpO2 100%.  Intake/Output Summary (Last 24 hours) at 02/01/2024 1149 Last data filed at 02/01/2024 0500 Gross per 24 hour  Intake --  Output 1500 ml  Net -1500 ml   There were no vitals filed for this visit.  Exam: Awake Alert, Oriented *3, No new F.N deficits, Normal affect Bishop.AT,PERRAL Supple Neck,No JVD, No cervical lymphadenopathy appriciated.  Symmetrical Chest wall movement, Good air movement bilaterally, CTAB RRR,No Gallops,Rubs or new Murmurs, No Parasternal Heave +ve B.Sounds, Abd Soft, Non  tender, No organomegaly appriciated, No rebound -guarding or rigidity. No Cyanosis, Clubbing or edema, No new Rash or bruise   PERTINENT RADIOLOGIC STUDIES: No results found.   PERTINENT LAB RESULTS: CBC: Recent Labs    01/30/24 0359 01/31/24 1035  WBC 20.4* 13.1*  HGB 11.4* 12.8*  HCT 32.7* 37.0*  PLT 116* 118*   CMET CMP     Component Value Date/Time   NA 143 02/01/2024 0924   K 3.3 (L) 02/01/2024 0924   CL 108 02/01/2024 0924   CO2 23 02/01/2024 0924   GLUCOSE 114 (H) 02/01/2024 0924   BUN 9 02/01/2024 0924   CREATININE 1.46 (H) 02/01/2024 0924   CALCIUM  7.9 (L) 02/01/2024 0924   PROT 8.1 01/27/2024 1809   ALBUMIN 2.7 (L) 01/27/2024 1809  AST 57 (H) 01/27/2024 1809   ALT 22 01/27/2024 1809   ALKPHOS 196 (H) 01/27/2024 1809   BILITOT 1.0 01/27/2024 1809   GFRNONAA 52 (L) 02/01/2024 0924    GFR CrCl cannot be calculated (Unknown ideal weight.). No results for input(s): LIPASE, AMYLASE in the last 72 hours. No results for input(s): CKTOTAL, CKMB, CKMBINDEX, TROPONINI in the last 72 hours. Invalid input(s): POCBNP No results for input(s): DDIMER in the last 72 hours. No results for input(s): HGBA1C in the last 72 hours. No results for input(s): CHOL, HDL, LDLCALC, TRIG, CHOLHDL, LDLDIRECT in the last 72 hours. No results for input(s): TSH, T4TOTAL, T3FREE, THYROIDAB in the last 72 hours.  Invalid input(s): FREET3 No results for input(s): VITAMINB12, FOLATE, FERRITIN, TIBC, IRON, RETICCTPCT in the last 72 hours. Coags: No results for input(s): INR in the last 72 hours.  Invalid input(s): PT Microbiology: Recent Results (from the past 240 hours)  Blood culture (routine x 2)     Status: None (Preliminary result)   Collection Time: 01/28/24  3:28 AM   Specimen: BLOOD  Result Value Ref Range Status   Specimen Description BLOOD BLOOD LEFT ARM  Final   Special Requests   Final    BOTTLES DRAWN AEROBIC AND  ANAEROBIC Blood Culture results may not be optimal due to an inadequate volume of blood received in culture bottles   Culture   Final    NO GROWTH 4 DAYS Performed at [facility], [address] [place], [place] [address]    Report Status PENDING  Incomplete  Blood culture (routine x 2)     Status: None (Preliminary result)   Collection Time: 01/28/24  3:28 AM   Specimen: BLOOD  Result Value Ref Range Status   Specimen Description BLOOD BLOOD RIGHT ARM  Final   Special Requests   Final    BOTTLES DRAWN AEROBIC ONLY Blood Culture results may not be optimal due to an inadequate volume of blood received in culture bottles   Culture   Final    NO GROWTH 4 DAYS Performed at [facility], [address], [place], [place] [address]    Report Status PENDING  Incomplete  C Difficile Quick Screen w PCR reflex     Status: None   Collection Time: 01/29/24  8:23 AM   Specimen: STOOL  Result Value Ref Range Status   C Diff antigen NEGATIVE NEGATIVE Final   C Diff toxin NEGATIVE NEGATIVE Final   C Diff interpretation No C. difficile detected.  Final    Comment: Performed at [facility], [address], [place], [place] [address]  Gastrointestinal Panel by PCR , Stool     Status: None   Collection Time: 01/29/24  8:24 AM   Specimen: STOOL  Result Value Ref Range Status   Campylobacter species NOT DETECTED NOT DETECTED Final   Plesimonas shigelloides NOT DETECTED NOT DETECTED Final   Salmonella species NOT DETECTED NOT DETECTED Final   Yersinia enterocolitica NOT DETECTED NOT DETECTED Final   Vibrio species NOT DETECTED NOT DETECTED Final   Vibrio cholerae NOT DETECTED NOT DETECTED Final   Enteroaggregative E coli (EAEC) NOT DETECTED NOT DETECTED Final   Enteropathogenic E coli (EPEC) NOT DETECTED NOT DETECTED Final   Enterotoxigenic E coli (ETEC) NOT DETECTED NOT DETECTED Final   Shiga like toxin producing E coli (STEC) NOT DETECTED NOT DETECTED Final    Shigella/Enteroinvasive E coli (EIEC) NOT DETECTED NOT DETECTED Final   Cryptosporidium  NOT DETECTED NOT DETECTED Final   Cyclospora cayetanensis NOT DETECTED NOT DETECTED Final   Entamoeba histolytica NOT DETECTED NOT DETECTED Final   Giardia lamblia NOT DETECTED NOT DETECTED Final   Adenovirus F40/41 NOT DETECTED NOT DETECTED Final   Astrovirus NOT DETECTED NOT DETECTED Final   Norovirus GI/GII NOT DETECTED NOT DETECTED Final   Rotavirus A NOT DETECTED NOT DETECTED Final   Sapovirus (I, II, IV, and V) NOT DETECTED NOT DETECTED Final    Comment: Performed at [facility], [address], [place], [place] [address]    FURTHER DISCHARGE INSTRUCTIONS:  Get Medicines reviewed and adjusted: Please take all your medications with you for your next visit with your Primary MD  Laboratory/radiological data: Please request your Primary MD to go over all hospital tests and procedure/radiological results at the follow up, please ask your Primary MD to get all Hospital records sent to his/her office.  In some cases, they will be blood work, cultures and biopsy results pending at the time of your discharge. Please request that your primary care M.D. goes through all the records of your hospital data and follows up on these results.  Also Note the following: If you experience worsening of your admission symptoms, develop shortness of breath, life threatening emergency, suicidal or homicidal thoughts you must seek medical attention immediately by calling 911 or calling your MD immediately  if symptoms less severe.  You must read complete instructions/literature along with all the possible adverse reactions/side effects for all the Medicines you take and that have been prescribed to you. Take any new Medicines after you have completely understood and accpet all the possible adverse reactions/side effects.   Do not drive when taking Pain medications or sleeping medications (Benzodaizepines)  Do  not take more than prescribed Pain, Sleep and Anxiety Medications. It is not advisable to combine anxiety,sleep and pain medications without talking with your primary care practitioner  Special Instructions: If you have smoked or chewed Tobacco  in the last 2 yrs please stop smoking, stop any regular Alcohol   and or any Recreational drug use.  Wear Seat belts while driving.  Please note: You were cared for by a hospitalist during your hospital stay. Once you are discharged, your primary care physician will handle any further medical issues. Please note that NO REFILLS for any discharge medications will be authorized once you are discharged, as it is imperative that you return to your primary care physician (or establish a relationship with a primary care physician if you do not have one) for your post hospital discharge needs so that they can reassess your need for medications and monitor your lab values.  Total Time spent coordinating discharge including counseling, education and face to face time equals greater than 30 minutes.  SignedBETHA Donalda Applebaum 02/01/2024 11:49 AM

## 2024-02-01 NOTE — Progress Notes (Signed)
 PROGRESS NOTE        PATIENT DETAILS Name: Dennis Zhang Age: 68 y.o. Sex: male Date of Birth: Mar 26, 1956 Admit Date: 01/27/2024 Admitting Physician Pratik JONETTA Fairly, DO ERE:Ropwpr, Bonni Lien  Brief Summary: Patient is a 68 y.o.  male with history of DM-2, HTN, HLD, chronic pain-who presented with left-sided facial droop-upon further workup-found to have acute CVA.  Significant events: 9/8>> admit to [facility].  Significant studies: 9/8>> MRI brain: Acute right frontal lobe infarct.  Punctate acute right occipital lobe infarct. 9/8>> MRI brain: No significant stenosis. 9/8>> echo: EF 60-65%. 9/8>> B/L carotid Doppler: No significant stenosis 9/8>> A1c: 5.1 9/8>> LDL: 30. 9/9>> CXR: No PNA  Significant microbiology data: 9/8>> blood culture: No growth 9/9>> stool studies for C. difficile: Negative 9/9>> GI pathogen panel stool: Negative  Procedures: None  Consults: Neurology  Subjective: No major issues overnight-lying comfortably in bed.  Objective: Vitals: Blood pressure (!) 173/107, pulse 75, temperature 97.8 F (36.6 C), temperature source Oral, resp. rate 15, SpO2 100%.   Exam: Awake/alert Chest: Clear to auscultation CVS: S1-S2 regular Neurology: Minimal left-sided weakness Extremity: No edema.  Pertinent Labs/Radiology:    Latest Ref Rng & Units 01/31/2024   10:35 AM 01/30/2024    3:59 AM 01/29/2024    5:48 AM  CBC  WBC 4.0 - 10.5 K/uL 13.1  20.4  21.6   Hemoglobin 13.0 - 17.0 g/dL 87.1  88.5  87.4   Hematocrit 39.0 - 52.0 % 37.0  32.7  35.4   Platelets 150 - 400 K/uL 118  116  130     Lab Results  Component Value Date   NA 143 02/01/2024   K 3.3 (L) 02/01/2024   CL 108 02/01/2024   CO2 23 02/01/2024      Assessment/Plan: Acute CVA Overall improved-minimal left upper extremity weakness and left facial weakness Work up as above Stroke MD recommending aspirin /Plavix  x 3 weeks followed by Plavix  alone Needs 30-day  heart monitoring arranged on discharge.  Epic message sent to cardiology this morning-they will arrange follow-up.  Diarrhea Essentially resolved Stool studies negative As needed Imodium.  Leukocytosis Suspect secondary to diarrhea/gastroenteritis Blood cultures/stool studies negative Thankfully CBC downtrending at this point-continue to monitor off antibiotics.  No need to pursue CT abdomen given clinical stability-benign abdominal exam and downtrending leukocytosis.  AKI on CKD stage IIIa AKI hemodynamically mediated secondary to diarrhea Improved with IVF/improvement in diarrhea Follow electrolytes Avoid nephrotoxic agents  Hypokalemia Continues to have mild hypokalemia which will be repleted today. Repeat electrolytes  HTN BP stable but slowly creeping up Continue to hold lisinopril -start low-dose amlodipine .   HLD Statin  DM-2 No further hypoglycemic episodes Encourage oral intake-follow.  Recent Labs    01/31/24 1624 01/31/24 2122 02/01/24 0748  GLUCAP 120* 209* 107*     Chronic pain syndrome Stable Continue Cymbalta /Neurontin   BPH Flomax   Nutrition Status: Nutrition Problem: Severe Malnutrition Etiology: chronic illness Signs/Symptoms: severe muscle depletion, severe fat depletion Interventions: Ensure Enlive (each supplement provides 350kcal and 20 grams of protein), Magic cup, Liberalize Diet, MVI, Education   Code status:   Code Status: Full Code   DVT Prophylaxis: heparin  injection 5,000 Units Start: 01/28/24 0600   Family Communication: None at bedside   Disposition Plan: Status is: Inpatient Remains inpatient appropriate because: Severity of illness   Planned Discharge Destination:Rehabilitation facility   Diet:  Diet Order             Diet regular Room service appropriate? Yes with Assist; Fluid consistency: Thin  Diet effective now                     Antimicrobial agents: Anti-infectives (From admission, onward)     None        MEDICATIONS: Scheduled Meds:  aspirin  EC  81 mg Oral Daily   atorvastatin   10 mg Oral Daily   clopidogrel   75 mg Oral Daily   DULoxetine   30 mg Oral Daily   feeding supplement  237 mL Oral TID BM   folic acid   1 mg Oral Daily   heparin   5,000 Units Subcutaneous Q8H   latanoprost   1 drop Both Eyes QHS   multivitamin with minerals  1 tablet Oral Daily   tamsulosin   0.4 mg Oral QPC breakfast   thiamine   100 mg Oral Daily   Continuous Infusions:   PRN Meds:.albuterol , artificial tears, gabapentin , ondansetron  **OR** ondansetron  (ZOFRAN ) IV, oxyCODONE    I have personally reviewed following labs and imaging studies  LABORATORY DATA: CBC: Recent Labs  Lab 01/27/24 1809 01/27/24 1838 01/28/24 0328 01/29/24 0548 01/30/24 0359 01/31/24 1035  WBC 9.8  --  14.0* 21.6* 20.4* 13.1*  NEUTROABS 7.2  --   --   --   --   --   HGB 15.2 17.0 13.2 12.5* 11.4* 12.8*  HCT 45.3 50.0 38.4* 35.4* 32.7* 37.0*  MCV 101.8*  --  101.1* 98.9 99.4 100.0  PLT 193  --  180 130* 116* 118*    Basic Metabolic Panel: Recent Labs  Lab 01/28/24 2155 01/29/24 0548 01/29/24 1738 01/30/24 0359 01/31/24 1035 02/01/24 0924  NA 140 142  --  142 144 143  K 2.4* 2.4*  --  3.0* 3.2* 3.3*  CL 105 106  --  110 107 108  CO2 24 22  --  21* 24 23  GLUCOSE 143* 106*  --  88 124* 114*  BUN 13 12  --  11 10 9   CREATININE 1.82* 1.70*  --  1.57* 1.38* 1.46*  CALCIUM  8.3* 8.5*  --  8.0* 8.1* 7.9*  MG  --  1.7 2.0 1.9 1.8 1.8  PHOS  --  1.8* 1.6* 2.4* 3.0  --     GFR: CrCl cannot be calculated (Unknown ideal weight.).  Liver Function Tests: Recent Labs  Lab 01/27/24 1809  AST 57*  ALT 22  ALKPHOS 196*  BILITOT 1.0  PROT 8.1  ALBUMIN 2.7*   No results for input(s): LIPASE, AMYLASE in the last 168 hours. No results for input(s): AMMONIA in the last 168 hours.  Coagulation Profile: No results for input(s): INR, PROTIME in the last 168 hours.  Cardiac Enzymes: No  results for input(s): CKTOTAL, CKMB, CKMBINDEX, TROPONINI in the last 168 hours.  BNP (last 3 results) No results for input(s): PROBNP in the last 8760 hours.  Lipid Profile: No results for input(s): CHOL, HDL, LDLCALC, TRIG, CHOLHDL, LDLDIRECT in the last 72 hours.   Thyroid  Function Tests: No results for input(s): TSH, T4TOTAL, FREET4, T3FREE, THYROIDAB in the last 72 hours.  Anemia Panel: No results for input(s): VITAMINB12, FOLATE, FERRITIN, TIBC, IRON, RETICCTPCT in the last 72 hours.  Urine analysis:    Component Value Date/Time   COLORURINE YELLOW 01/29/2024 1124   APPEARANCEUR HAZY (A) 01/29/2024 1124   LABSPEC 1.005 01/29/2024 1124   PHURINE 5.0 01/29/2024 1124  GLUCOSEU NEGATIVE 01/29/2024 1124   HGBUR MODERATE (A) 01/29/2024 1124   BILIRUBINUR NEGATIVE 01/29/2024 1124   KETONESUR NEGATIVE 01/29/2024 1124   PROTEINUR NEGATIVE 01/29/2024 1124   UROBILINOGEN 1.0 06/14/2014 1847   NITRITE POSITIVE (A) 01/29/2024 1124   LEUKOCYTESUR NEGATIVE 01/29/2024 1124    Sepsis Labs: Lactic Acid, Venous    Component Value Date/Time   LATICACIDVEN 1.5 01/28/2024 0328    MICROBIOLOGY: Recent Results (from the past 240 hours)  Blood culture (routine x 2)     Status: None (Preliminary result)   Collection Time: 01/28/24  3:28 AM   Specimen: BLOOD  Result Value Ref Range Status   Specimen Description BLOOD BLOOD LEFT ARM  Final   Special Requests   Final    BOTTLES DRAWN AEROBIC AND ANAEROBIC Blood Culture results may not be optimal due to an inadequate volume of blood received in culture bottles   Culture   Final    NO GROWTH 4 DAYS Performed at [facility], [address], [place], [place] [address]    Report Status PENDING  Incomplete  Blood culture (routine x 2)     Status: None (Preliminary result)   Collection Time: 01/28/24  3:28 AM   Specimen: BLOOD  Result Value Ref Range Status   Specimen Description BLOOD  BLOOD RIGHT ARM  Final   Special Requests   Final    BOTTLES DRAWN AEROBIC ONLY Blood Culture results may not be optimal due to an inadequate volume of blood received in culture bottles   Culture   Final    NO GROWTH 4 DAYS Performed at [facility], [address], [place], [place] [address]    Report Status PENDING  Incomplete  C Difficile Quick Screen w PCR reflex     Status: None   Collection Time: 01/29/24  8:23 AM   Specimen: STOOL  Result Value Ref Range Status   C Diff antigen NEGATIVE NEGATIVE Final   C Diff toxin NEGATIVE NEGATIVE Final   C Diff interpretation No C. difficile detected.  Final    Comment: Performed at [facility], [address] [place], [place] [address]  Gastrointestinal Panel by PCR , Stool     Status: None   Collection Time: 01/29/24  8:24 AM   Specimen: STOOL  Result Value Ref Range Status   Campylobacter species NOT DETECTED NOT DETECTED Final   Plesimonas shigelloides NOT DETECTED NOT DETECTED Final   Salmonella species NOT DETECTED NOT DETECTED Final   Yersinia enterocolitica NOT DETECTED NOT DETECTED Final   Vibrio species NOT DETECTED NOT DETECTED Final   Vibrio cholerae NOT DETECTED NOT DETECTED Final   Enteroaggregative E coli (EAEC) NOT DETECTED NOT DETECTED Final   Enteropathogenic E coli (EPEC) NOT DETECTED NOT DETECTED Final   Enterotoxigenic E coli (ETEC) NOT DETECTED NOT DETECTED Final   Shiga like toxin producing E coli (STEC) NOT DETECTED NOT DETECTED Final   Shigella/Enteroinvasive E coli (EIEC) NOT DETECTED NOT DETECTED Final   Cryptosporidium NOT DETECTED NOT DETECTED Final   Cyclospora cayetanensis NOT DETECTED NOT DETECTED Final   Entamoeba histolytica NOT DETECTED NOT DETECTED Final   Giardia lamblia NOT DETECTED NOT DETECTED Final   Adenovirus F40/41 NOT DETECTED NOT DETECTED Final   Astrovirus NOT DETECTED NOT DETECTED Final   Norovirus GI/GII NOT DETECTED NOT DETECTED Final   Rotavirus A NOT DETECTED NOT  DETECTED Final   Sapovirus (I, II, IV, and V) NOT DETECTED NOT DETECTED Final  Comment: Performed at [facility], [address] [place], [place] [address]    RADIOLOGY STUDIES/RESULTS: No results found.    LOS: 4 days   Donalda Applebaum, MD  [facility]    To contact the attending provider between 7A-7P or the covering provider during after hours 7P-7A, please log into the web site [email or web address] and access using universal [place] password for that web site. If you do not have the password, please call the hospital operator.  02/01/2024, 11:39 AM

## 2024-02-01 NOTE — TOC Transition Note (Signed)
 Transition of Care [facility]) - Discharge Note   Patient Details  Name: Dennis Zhang MRN: [id number] Date of Birth: 03-01-1956  [facility]) CM/SW Contact:  [name], LCSW Phone Number: 02/01/2024, 1:28 PM   Clinical Narrative:      Per MD patient ready for DC to [facility]. RN, patient, patient's family, and facility notified of DC. Discharge Summary and FL2 sent to facility. RN to call report prior to discharge [phone]). DC packet on chart. Ambulance transport requested for patient.   CSW will sign off for now as social work intervention is no longer needed. Please consult us  again if new needs arise.    Final next level of care: Skilled Nursing Facility Barriers to Discharge: No Barriers Identified   Patient Goals and CMS Choice Patient states their goals for this hospitalization and ongoing recovery are:: Rehab CMS Medicare.gov Compare Post Acute Care list provided to:: Patient Represenative (must comment) Choice offered to / list presented to : Sibling Brownell ownership interest in [facility].provided to:: Sibling    Discharge Placement              Patient [facility] bed at: [facility] Patient to be transferred to facility by: [facility] Name of family member notified: Sister Quin Distel   [phone] [facility]) Patient and family notified of of transfer: 02/01/24  Discharge Plan and Services Additional resources added to the After Visit Summary for   In-house Referral: Clinical Social Work   Post Acute Care Choice: Skilled Nursing Facility                               Social Drivers of Health (SDOH) Interventions SDOH Screenings   Food Insecurity: No Food Insecurity (01/29/2024)  Housing: High Risk (01/29/2024)  Transportation Needs: No Transportation Needs (01/29/2024)  Utilities: Not At Risk (01/29/2024)  Depression (PHQ2-9): Medium Risk (09/28/2020)  Social Connections: Moderately Isolated (01/29/2024)  Tobacco Use:  High Risk (01/27/2024)     Readmission Risk Interventions     No data to display

## 2024-02-01 NOTE — Progress Notes (Signed)
 SLP Cancellation Note  Patient Details Name: Dennis Zhang MRN: [id number] DOB: 1955-08-02   Cancelled treatment:        Pt receiving nursing care - SLP waiting however tech/student just called out for help and pt unavailable. Will continue efforts.    Dustin Olam Bull 02/01/2024, 12:08 PM

## 2024-02-01 NOTE — TOC Progression Note (Signed)
 Transition of Care [facility]) - Progression Note    Patient Details  Name: Dennis Zhang MRN: [id number] Date of Birth: Jul 04, 1955  Transition of Care [facility]) CM/SW Contact  [name], [place] Phone Number: 02/01/2024, 8:56 AM  Clinical Narrative:     Shara still pending at this time   Expected Discharge Plan: Skilled Nursing Facility Barriers to Discharge: Continued Medical Work up, English as a second language teacher               Expected Discharge Plan and Services In-house Referral: Clinical Social Work   Post Acute Care Choice: Skilled Nursing Facility Living arrangements for the past 2 months: Apartment                                       Social Drivers of Health (SDOH) Interventions SDOH Screenings   Food Insecurity: No Food Insecurity (01/29/2024)  Housing: High Risk (01/29/2024)  Transportation Needs: No Transportation Needs (01/29/2024)  Utilities: Not At Risk (01/29/2024)  Depression (PHQ2-9): Medium Risk (09/28/2020)  Social Connections: Moderately Isolated (01/29/2024)  Tobacco Use: High Risk (01/27/2024)    Readmission Risk Interventions     No data to display

## 2024-02-01 NOTE — Progress Notes (Signed)
 Event monitor for stroke. Dr. Okey to read

## 2024-02-01 NOTE — TOC CAGE-AID Note (Signed)
 Transition of Care [facility]) - CAGE-AID Screening   Patient Details  Name: Dennis Zhang MRN: [id number] Date of Birth: [birth year]  Transition [facility]) CM/SW Contact:    Theophil Thivierge E Alvia Tory, LCSW Phone Number: 02/01/2024, 9:08 AM   Clinical Narrative: Not fully oriented.   CAGE-AID Screening: Substance Abuse Screening unable to be completed due to: : Patient unable to participate

## 2024-02-01 NOTE — Plan of Care (Signed)

## 2024-02-01 NOTE — Progress Notes (Addendum)
   02/01/24 1000  Mobility  Activity Ambulated with assistance  Level of Assistance Contact guard assist, steadying assist  Assistive Device Front wheel walker  Distance Ambulated (ft) 600 ft  Activity Response Tolerated fair  Mobility Referral Yes  Mobility visit 1 Mobility  Mobility Specialist Start Time (ACUTE ONLY) 1000  Mobility Specialist Stop Time (ACUTE ONLY) 1032  Mobility Specialist Time Calculation (min) (ACUTE ONLY) 32 min   Mobility Specialist: Progress Note- Visits:2  Pre-Mobility:   HR 65,  Post -Mobility: HR 109-112  Pt agreeable to mobility session - received in bed. Pt was asymptomatic throughout session with no complaints. Returned to chair with all needs met - call bell within reach. Chair alarm on.   __________________________________________________________________________  Post -Mobility: HR 108  NT requesting assistance with positioning pt in bed.  Pt agreeable to mobility session - received lying supine bed. Heavy cues required to shift hips towards middle of bed. Pt was positioned higher and centered in bed. Pt c/o head pain grabbing top of head, pt also appeared very lethargic- RN notified and aware. Returned to chair position in bed , pt set up to eat lunch with all needs met - call bell within reach. Bed alarm on. NT and Alyse Patron present.   Further mobility deferred d/t sx and NT just transferred pt to bed with +2 assist.  Dennis Zhang, BS Mobility Specialist Please contact via SecureChat or  Rehab office at [phone].

## 2024-02-02 LAB — CULTURE, BLOOD (ROUTINE X 2)
Culture: NO GROWTH
Culture: NO GROWTH

## 2024-02-04 ENCOUNTER — Encounter: Payer: Self-pay | Admitting: *Deleted

## 2024-02-04 NOTE — Progress Notes (Signed)
 Patient enrolled for [employer] to ship a 30 day cardiac event monitor to : Dennis Zhang in care of [facility], [address] [place], [place], [place]  [address], Attn: Director of Nursing Letter with instructions also mailed to above address.

## 2024-03-01 ENCOUNTER — Emergency Department (HOSPITAL_COMMUNITY)

## 2024-03-01 ENCOUNTER — Inpatient Hospital Stay (HOSPITAL_COMMUNITY)
Admission: EM | Admit: 2024-03-01 | Discharge: 2024-03-22 | DRG: 683 | Disposition: E | Attending: Internal Medicine | Admitting: Internal Medicine

## 2024-03-01 ENCOUNTER — Encounter (HOSPITAL_COMMUNITY): Payer: Self-pay

## 2024-03-01 ENCOUNTER — Other Ambulatory Visit: Payer: Self-pay

## 2024-03-01 DIAGNOSIS — E1122 Type 2 diabetes mellitus with diabetic chronic kidney disease: Secondary | ICD-10-CM | POA: Diagnosis present

## 2024-03-01 DIAGNOSIS — G8929 Other chronic pain: Secondary | ICD-10-CM | POA: Diagnosis present

## 2024-03-01 DIAGNOSIS — F32A Depression, unspecified: Secondary | ICD-10-CM | POA: Diagnosis present

## 2024-03-01 DIAGNOSIS — Z8249 Family history of ischemic heart disease and other diseases of the circulatory system: Secondary | ICD-10-CM

## 2024-03-01 DIAGNOSIS — Z515 Encounter for palliative care: Secondary | ICD-10-CM | POA: Diagnosis not present

## 2024-03-01 DIAGNOSIS — I129 Hypertensive chronic kidney disease with stage 1 through stage 4 chronic kidney disease, or unspecified chronic kidney disease: Secondary | ICD-10-CM | POA: Diagnosis present

## 2024-03-01 DIAGNOSIS — Z681 Body mass index (BMI) 19 or less, adult: Secondary | ICD-10-CM | POA: Diagnosis not present

## 2024-03-01 DIAGNOSIS — E440 Moderate protein-calorie malnutrition: Secondary | ICD-10-CM | POA: Diagnosis present

## 2024-03-01 DIAGNOSIS — R531 Weakness: Secondary | ICD-10-CM | POA: Diagnosis present

## 2024-03-01 DIAGNOSIS — F17210 Nicotine dependence, cigarettes, uncomplicated: Secondary | ICD-10-CM | POA: Diagnosis present

## 2024-03-01 DIAGNOSIS — G9341 Metabolic encephalopathy: Secondary | ICD-10-CM

## 2024-03-01 DIAGNOSIS — E785 Hyperlipidemia, unspecified: Secondary | ICD-10-CM | POA: Diagnosis present

## 2024-03-01 DIAGNOSIS — Z79899 Other long term (current) drug therapy: Secondary | ICD-10-CM

## 2024-03-01 DIAGNOSIS — R64 Cachexia: Secondary | ICD-10-CM | POA: Diagnosis present

## 2024-03-01 DIAGNOSIS — Z886 Allergy status to analgesic agent status: Secondary | ICD-10-CM

## 2024-03-01 DIAGNOSIS — N400 Enlarged prostate without lower urinary tract symptoms: Secondary | ICD-10-CM | POA: Diagnosis present

## 2024-03-01 DIAGNOSIS — Z66 Do not resuscitate: Secondary | ICD-10-CM | POA: Diagnosis present

## 2024-03-01 DIAGNOSIS — K219 Gastro-esophageal reflux disease without esophagitis: Secondary | ICD-10-CM | POA: Diagnosis present

## 2024-03-01 DIAGNOSIS — Z7902 Long term (current) use of antithrombotics/antiplatelets: Secondary | ICD-10-CM

## 2024-03-01 DIAGNOSIS — N189 Chronic kidney disease, unspecified: Secondary | ICD-10-CM | POA: Diagnosis not present

## 2024-03-01 DIAGNOSIS — R0603 Acute respiratory distress: Secondary | ICD-10-CM | POA: Diagnosis present

## 2024-03-01 DIAGNOSIS — N1832 Chronic kidney disease, stage 3b: Secondary | ICD-10-CM | POA: Diagnosis present

## 2024-03-01 DIAGNOSIS — E860 Dehydration: Secondary | ICD-10-CM | POA: Diagnosis present

## 2024-03-01 DIAGNOSIS — R29810 Facial weakness: Secondary | ICD-10-CM | POA: Diagnosis present

## 2024-03-01 DIAGNOSIS — E872 Acidosis, unspecified: Secondary | ICD-10-CM | POA: Diagnosis present

## 2024-03-01 DIAGNOSIS — Z888 Allergy status to other drugs, medicaments and biological substances status: Secondary | ICD-10-CM

## 2024-03-01 DIAGNOSIS — R6510 Systemic inflammatory response syndrome (SIRS) of non-infectious origin without acute organ dysfunction: Secondary | ICD-10-CM | POA: Diagnosis present

## 2024-03-01 DIAGNOSIS — R578 Other shock: Secondary | ICD-10-CM | POA: Diagnosis present

## 2024-03-01 DIAGNOSIS — R627 Adult failure to thrive: Secondary | ICD-10-CM | POA: Diagnosis present

## 2024-03-01 DIAGNOSIS — N179 Acute kidney failure, unspecified: Principal | ICD-10-CM | POA: Diagnosis present

## 2024-03-01 DIAGNOSIS — E8721 Acute metabolic acidosis: Secondary | ICD-10-CM | POA: Diagnosis not present

## 2024-03-01 DIAGNOSIS — I9589 Other hypotension: Secondary | ICD-10-CM | POA: Diagnosis present

## 2024-03-01 DIAGNOSIS — R579 Shock, unspecified: Secondary | ICD-10-CM | POA: Diagnosis present

## 2024-03-01 DIAGNOSIS — G9340 Encephalopathy, unspecified: Secondary | ICD-10-CM | POA: Diagnosis present

## 2024-03-01 DIAGNOSIS — E876 Hypokalemia: Secondary | ICD-10-CM | POA: Diagnosis present

## 2024-03-01 DIAGNOSIS — Z8673 Personal history of transient ischemic attack (TIA), and cerebral infarction without residual deficits: Secondary | ICD-10-CM

## 2024-03-01 LAB — COMPREHENSIVE METABOLIC PANEL WITH GFR
ALT: 18 U/L (ref 0–44)
AST: 39 U/L (ref 15–41)
Albumin: 1.9 g/dL — ABNORMAL LOW (ref 3.5–5.0)
Alkaline Phosphatase: 162 U/L — ABNORMAL HIGH (ref 38–126)
Anion gap: 19 — ABNORMAL HIGH (ref 5–15)
BUN: 29 mg/dL — ABNORMAL HIGH (ref 8–23)
CO2: 24 mmol/L (ref 22–32)
Calcium: 8.7 mg/dL — ABNORMAL LOW (ref 8.9–10.3)
Chloride: 97 mmol/L — ABNORMAL LOW (ref 98–111)
Creatinine, Ser: 3.42 mg/dL — ABNORMAL HIGH (ref 0.61–1.24)
GFR, Estimated: 19 mL/min — ABNORMAL LOW (ref >60)
Glucose, Bld: 141 mg/dL — ABNORMAL HIGH (ref 70–99)
Potassium: <2 mmol/L — CL (ref 3.5–5.1)
Sodium: 140 mmol/L (ref 135–145)
Total Bilirubin: 1.3 mg/dL — ABNORMAL HIGH (ref 0.0–1.2)
Total Protein: 7.3 g/dL (ref 6.5–8.1)

## 2024-03-01 LAB — CBC WITH DIFFERENTIAL/PLATELET
Abs Immature Granulocytes: 0.11 K/uL — ABNORMAL HIGH (ref 0.00–0.07)
Basophils Absolute: 0 K/uL (ref 0.0–0.1)
Basophils Relative: 0 %
Eosinophils Absolute: 0 K/uL (ref 0.0–0.5)
Eosinophils Relative: 0 %
HCT: 36.3 % — ABNORMAL LOW (ref 39.0–52.0)
Hemoglobin: 12.9 g/dL — ABNORMAL LOW (ref 13.0–17.0)
Immature Granulocytes: 1 %
Lymphocytes Relative: 9 %
Lymphs Abs: 1.2 K/uL (ref 0.7–4.0)
MCH: 33.2 pg (ref 26.0–34.0)
MCHC: 35.5 g/dL (ref 30.0–36.0)
MCV: 93.3 fL (ref 80.0–100.0)
Monocytes Absolute: 0.7 K/uL (ref 0.1–1.0)
Monocytes Relative: 5 %
Neutro Abs: 11.8 K/uL — ABNORMAL HIGH (ref 1.7–7.7)
Neutrophils Relative %: 85 %
Platelets: 174 K/uL (ref 150–400)
RBC: 3.89 MIL/uL — ABNORMAL LOW (ref 4.22–5.81)
RDW: 14.8 % (ref 11.5–15.5)
WBC: 13.9 K/uL — ABNORMAL HIGH (ref 4.0–10.5)
nRBC: 0.1 % (ref 0.0–0.2)

## 2024-03-01 LAB — LIPASE, BLOOD: Lipase: 16 U/L (ref 11–51)

## 2024-03-01 LAB — MAGNESIUM: Magnesium: 1.4 mg/dL — ABNORMAL LOW (ref 1.7–2.4)

## 2024-03-01 MED ORDER — MAGNESIUM SULFATE 2 GM/50ML IV SOLN
2.0000 g | Freq: Once | INTRAVENOUS | Status: AC
  Administered 2024-03-01: 2 g via INTRAVENOUS
  Filled 2024-03-01: qty 50

## 2024-03-01 MED ORDER — ACETAMINOPHEN 325 MG PO TABS
650.0000 mg | ORAL_TABLET | Freq: Four times a day (QID) | ORAL | Status: DC | PRN
  Administered 2024-03-02: 650 mg via ORAL
  Filled 2024-03-01: qty 2

## 2024-03-01 MED ORDER — DULOXETINE HCL 30 MG PO CPEP
30.0000 mg | ORAL_CAPSULE | Freq: Every day | ORAL | Status: DC
  Administered 2024-03-02: 30 mg via ORAL
  Filled 2024-03-01: qty 1

## 2024-03-01 MED ORDER — POTASSIUM CHLORIDE 10 MEQ/100ML IV SOLN
10.0000 meq | INTRAVENOUS | Status: AC
  Administered 2024-03-01 – 2024-03-02 (×4): 10 meq via INTRAVENOUS
  Filled 2024-03-01 (×3): qty 100

## 2024-03-01 MED ORDER — POTASSIUM CHLORIDE 20 MEQ PO PACK
40.0000 meq | PACK | ORAL | Status: DC
  Administered 2024-03-02 (×3): 40 meq via ORAL
  Filled 2024-03-01 (×4): qty 2

## 2024-03-01 MED ORDER — ACETAMINOPHEN 650 MG RE SUPP: 650.0000 mg | Freq: Four times a day (QID) | RECTAL | Status: DC | PRN

## 2024-03-01 MED ORDER — MAGNESIUM SULFATE 2 GM/50ML IV SOLN
2.0000 g | Freq: Once | INTRAVENOUS | Status: AC
  Administered 2024-03-02: 2 g via INTRAVENOUS
  Filled 2024-03-01: qty 50

## 2024-03-01 MED ORDER — ATORVASTATIN CALCIUM 10 MG PO TABS
10.0000 mg | ORAL_TABLET | Freq: Every day | ORAL | Status: DC
  Administered 2024-03-02: 10 mg via ORAL
  Filled 2024-03-01: qty 1

## 2024-03-01 MED ORDER — ONDANSETRON HCL 4 MG/2ML IJ SOLN: 4.0000 mg | Freq: Four times a day (QID) | INTRAMUSCULAR | Status: DC | PRN

## 2024-03-01 MED ORDER — TAMSULOSIN HCL 0.4 MG PO CAPS
0.4000 mg | ORAL_CAPSULE | Freq: Every day | ORAL | Status: DC
  Administered 2024-03-02: 0.4 mg via ORAL
  Filled 2024-03-01: qty 1

## 2024-03-01 MED ORDER — SODIUM CHLORIDE 0.9 % IV SOLN: INTRAVENOUS | Status: DC

## 2024-03-01 MED ORDER — ENOXAPARIN SODIUM 30 MG/0.3ML IJ SOSY
30.0000 mg | PREFILLED_SYRINGE | INTRAMUSCULAR | Status: DC
  Administered 2024-03-02: 30 mg via SUBCUTANEOUS
  Filled 2024-03-01: qty 0.3

## 2024-03-01 MED ORDER — ONDANSETRON HCL 4 MG PO TABS: 4.0000 mg | ORAL_TABLET | Freq: Four times a day (QID) | ORAL | Status: DC | PRN

## 2024-03-01 MED ORDER — GABAPENTIN 300 MG PO CAPS: 300.0000 mg | ORAL_CAPSULE | Freq: Four times a day (QID) | ORAL | Status: DC | PRN

## 2024-03-01 MED ORDER — CLOPIDOGREL BISULFATE 75 MG PO TABS
75.0000 mg | ORAL_TABLET | Freq: Every day | ORAL | Status: DC
  Administered 2024-03-02: 75 mg via ORAL
  Filled 2024-03-01: qty 1

## 2024-03-01 MED ORDER — SODIUM CHLORIDE 0.9 % IV BOLUS
1000.0000 mL | Freq: Once | INTRAVENOUS | Status: AC
  Administered 2024-03-01: 1000 mL via INTRAVENOUS

## 2024-03-01 MED ORDER — POTASSIUM CHLORIDE 10 MEQ/100ML IV SOLN
10.0000 meq | INTRAVENOUS | Status: DC
Start: 2024-03-01 — End: 2024-03-01
  Administered 2024-03-01: 10 meq via INTRAVENOUS
  Filled 2024-03-01: qty 100

## 2024-03-01 MED ORDER — AMLODIPINE BESYLATE 5 MG PO TABS
5.0000 mg | ORAL_TABLET | Freq: Every day | ORAL | Status: DC
  Administered 2024-03-02: 5 mg via ORAL
  Filled 2024-03-01: qty 1

## 2024-03-01 MED ORDER — OXYCODONE HCL 5 MG PO TABS
5.0000 mg | ORAL_TABLET | ORAL | Status: DC | PRN
  Administered 2024-03-02 (×2): 5 mg via ORAL
  Filled 2024-03-01 (×2): qty 1

## 2024-03-01 NOTE — ED Provider Notes (Addendum)
 [place] [facility]GENCY DEPARTMENT AT [facility] Provider Note   CSN: [id number] Arrival date & time: 03/01/24  1635     Patient presents with: Failure To Thrive   Dennis Zhang is a 68 y.o. male.   Patient brought in by EMS from home patient says he was in a hotel.  Malnourished patient has a history of stroke patient was reported to have been on couch for a day.  But he was covered in stool and urine.  He was alert and oriented but unaware of date and year blood pressure was 112/60.  Here temp was 98.2 pulse 110 respiration 16 blood pressure 102/66.  Oxygen saturation was in the upper 90s.  Past medical history sniffer hypertension chronic pain diabetes hyperlipidemia history of seizures hepatitis depression surgical history significant for abdominal surgery hemorrhoidectomy appendectomy and is an everyday smoker.  Patient last seen on September 7 strokelike symptoms and was admitted for hypokalemia.  Patient supposedly has a history of seizures looking through his meds he is on Plavix  Neurontin  do not see any antiseizure medicines also been on thiamine  a long time ago so may have been a had a history of drinking.  Patient is extremely emaciated.  Review of his last admission he was admitted September 6 discharge September 12.  He is followed by the [facility].  At that time September 8 MRI showed acute right frontal lobe infarct.  Patient had minimal improvement of left upper extremity weakness and left facial weakness he was to be taking aspirin  and Plavix  for 3 weeks.  He had acute kidney injury on chronic kidney disease hypokalemia hypertension type 2 diabetes.  So discharge diagnosis was the CVA hypokalemia malnutrition of moderate degree acute kidney injury seizures and acute CVA.       Prior to Admission medications   Medication Sig Start Date End Date Taking? Authorizing Provider  albuterol  (VENTOLIN  HFA) 108 (90 Base) MCG/ACT inhaler Inhale 2 puffs into the lungs  every 6 (six) hours as needed for wheezing or shortness of breath.    [provider]  amLODipine  (NORVASC ) 5 MG tablet Take 1 tablet (5 mg total) by mouth daily. 02/01/24   Ghimire, Donalda HERO, MD  artificial tears ophthalmic solution Place 1 drop into both eyes as needed for dry eyes.    [provider]  atorvastatin  (LIPITOR) 10 MG tablet Take 10 mg by mouth daily.    [provider]  Cholecalciferol 50 MCG (2000 UT) TABS Take 2,000 Units by mouth every morning.    [provider]  clopidogrel  (PLAVIX ) 75 MG tablet Take 1 tablet (75 mg total) by mouth daily. 02/02/24   Ghimire, Donalda HERO, MD  DULoxetine  (CYMBALTA ) 30 MG capsule Take 30 mg by mouth daily. For pain and mood stabilization    [provider]  feeding supplement, ENSURE COMPLETE, (ENSURE COMPLETE) LIQD Take 237 mLs by mouth 3 (three) times daily between meals. 06/16/14   Samtani, Jai-Gurmukh, MD  folic acid  (FOLVITE ) 1 MG tablet Take 1 tablet (1 mg total) by mouth daily. 05/24/16   Jerri Keys, MD  gabapentin  (NEURONTIN ) 300 MG capsule Take 1 capsule (300 mg total) by mouth 4 (four) times daily as needed (for nerve pain.). 02/01/24   Ghimire, Donalda HERO, MD  latanoprost  (XALATAN ) 0.005 % ophthalmic solution Place 1 drop into both eyes at bedtime.    [provider]  Magnesium  Oxide 420 MG TABS Take 420 mg by mouth daily.     [provider]  Multiple Vitamin (MULTIVITAMIN WITH MINERALS) TABS tablet Take 1 tablet by mouth daily.    [provider]  potassium chloride  SA (KLOR-CON  M) 20 MEQ tablet Take 20 mEq by mouth daily after breakfast.    [provider]  sildenafil (VIAGRA) 100 MG tablet Take 50 mg by mouth daily as needed for erectile dysfunction.     [provider]  tamsulosin  (FLOMAX ) 0.4 MG CAPS capsule Take 1 capsule (0.4 mg total) by mouth daily after breakfast. 05/23/16   Jerri Keys, MD  thiamine  100 MG tablet Take 1 tablet (100 mg total) by mouth  daily. 05/24/16   Jerri Keys, MD    Allergies: Ace inhibitors, Aspirin , Metformin, Methocarbamol, and Tylenol  [acetaminophen ]    Review of Systems  Unable to perform ROS: Mental status change    Updated Vital Signs BP 107/89   Pulse (!) 110   Temp 98.2 F (36.8 C) (Oral)   Resp 20   Ht 1.88 m (6' 2)   Wt 67.1 kg   BMI 18.99 kg/m   Physical Exam Constitutional:      Appearance: He is ill-appearing.     Comments: Emaciated  HENT:     Mouth/Throat:     Mouth: Mucous membranes are dry.  Eyes:     Extraocular Movements: Extraocular movements intact.     Pupils: Pupils are equal, round, and reactive to light.  Pulmonary:     Effort: No respiratory distress.     Breath sounds: No wheezing or rales.  Abdominal:     Tenderness: There is no abdominal tenderness.     Comments: Concave abdomen  Musculoskeletal:        General: No swelling.     Cervical back: Normal range of motion. No rigidity.  Neurological:     Motor: Weakness present.     Comments: Left upper extremity weakness.  Questionable left facial weakness.     (all labs ordered are listed, but only abnormal results are displayed) Labs Reviewed  CBC WITH DIFFERENTIAL/PLATELET - Abnormal; Notable for the following components:      Result Value   WBC 13.9 (*)    RBC 3.89 (*)    Hemoglobin 12.9 (*)    HCT 36.3 (*)    Neutro Abs 11.8 (*)    Abs Immature Granulocytes 0.11 (*)    All other components within normal limits  COMPREHENSIVE METABOLIC PANEL WITH GFR - Abnormal; Notable for the following components:   Potassium <2.0 (*)    Chloride 97 (*)    Glucose, Bld 141 (*)    BUN 29 (*)    Creatinine, Ser 3.42 (*)    Calcium  8.7 (*)    Albumin 1.9 (*)    Alkaline Phosphatase 162 (*)    Total Bilirubin 1.3 (*)    GFR, Estimated 19 (*)    Anion gap 19 (*)    All other components within normal limits  MAGNESIUM  - Abnormal; Notable for the following components:   Magnesium  1.4 (*)    All other components within  normal limits  LIPASE, BLOOD  URINALYSIS, ROUTINE W REFLEX MICROSCOPIC    EKG: None  Radiology: DG Chest Port 1 View Result Date: 03/01/2024 CLINICAL DATA:  Failure to thrive EXAM: PORTABLE CHEST 1 VIEW COMPARISON:  01/29/2024 FINDINGS: The heart size and mediastinal contours are within normal limits. Both lungs are clear. The visualized skeletal structures are unremarkable. IMPRESSION: No active disease. Electronically Signed   By: Franky Crease M.D.  On: 03/01/2024 21:50     Procedures   Medications Ordered in the ED  0.9 %  sodium chloride  infusion ( Intravenous New Bag/Given 03/01/24 2200)  potassium chloride  10 mEq in 100 mL IVPB (has no administration in time range)  magnesium  sulfate IVPB 2 g 50 mL (has no administration in time range)  sodium chloride  0.9 % bolus 1,000 mL (1,000 mLs Intravenous New Bag/Given 03/01/24 2159)                                    Medical Decision Making Amount and/or Complexity of Data Reviewed Labs: ordered. Radiology: ordered.  Risk Prescription drug management. Decision regarding hospitalization.   Patient very emaciated.  Was discharged early September with left-sided stroke symptoms.  Patient seems to be a failure to thrive.  Is significantly emaciated.  Will give IV fluids will get CT head will get chest x-ray.  Will probably need CT abdomen and pelvis.  Will get labs.  Most likely patient will require admission.  CBC white count 13.9 hemoglobin 12.9 platelets are 174.  Lipase 16 complete metabolic panel pending magnesium  pending chest x-ray no active disease.  CT abdomen and pelvis pending as well.  Additional labs and CT scans are still pending.  Patient's complete metabolic panel is back.  Potassium is less than 2.  GFR is 19 creatinine is 3.42 BUN 29.  So consistent with acute kidney injury as well.  Magnesium  a little low at 1.4.  Will give some IV runs of potassium and some IV magnesium .  Will contact hospitalist for  admission.  Needs admission for the hypomagnesia him for the hypokalemia and acute kidney injury and failure to thrive.  CRITICAL CARE Performed by: Daequan Kozma Total critical care time: 60 minutes Critical care time was exclusive of separately billable procedures and treating other patients. Critical care was necessary to treat or prevent imminent or life-threatening deterioration. Critical care was time spent personally by me on the following activities: development of treatment plan with patient and/or surrogate as well as nursing, discussions with consultants, evaluation of patient's response to treatment, examination of patient, obtaining history from patient or surrogate, ordering and performing treatments and interventions, ordering and review of laboratory studies, ordering and review of radiographic studies, pulse oximetry and re-evaluation of patient's condition.   Final diagnoses:  Failure to thrive in adult  AKI (acute kidney injury)  Hypokalemia  Hypomagnesemia    ED Discharge Orders     None          Geraldene Hamilton, MD 03/01/24 2030    Geraldene Hamilton, MD 03/01/24 7773    Geraldene Hamilton, MD 03/01/24 2246

## 2024-03-01 NOTE — ED Notes (Signed)
 PT was full of feces and urine and in a pull up. PT's mouth was dry and skin. PT was stripped out of his clothes and taken to the decon room to be cleaned up. PT is now resting comfortably hooked up to monitor.Physician notified.

## 2024-03-01 NOTE — ED Notes (Signed)
 PT spoke with sister on the phone

## 2024-03-01 NOTE — ED Triage Notes (Signed)
 Pt bibems from home. Pt malnourished. Hx of stroke per EMS report. Pt was reported to have been on the couch for day and not right Aox3, unaware of the date/year.  BP 112/60

## 2024-03-01 NOTE — ED Notes (Signed)
 IV team bedside.

## 2024-03-01 NOTE — ED Notes (Signed)
 Called and placed PT on monitor with CCMD

## 2024-03-02 ENCOUNTER — Inpatient Hospital Stay (HOSPITAL_COMMUNITY)

## 2024-03-02 DIAGNOSIS — Z515 Encounter for palliative care: Secondary | ICD-10-CM | POA: Diagnosis not present

## 2024-03-02 DIAGNOSIS — E440 Moderate protein-calorie malnutrition: Secondary | ICD-10-CM | POA: Diagnosis not present

## 2024-03-02 DIAGNOSIS — E876 Hypokalemia: Secondary | ICD-10-CM

## 2024-03-02 DIAGNOSIS — Z66 Do not resuscitate: Secondary | ICD-10-CM | POA: Diagnosis not present

## 2024-03-02 DIAGNOSIS — N179 Acute kidney failure, unspecified: Secondary | ICD-10-CM | POA: Diagnosis not present

## 2024-03-02 LAB — CBC
HCT: 36.8 % — ABNORMAL LOW (ref 39.0–52.0)
Hemoglobin: 13 g/dL (ref 13.0–17.0)
MCH: 33.6 pg (ref 26.0–34.0)
MCHC: 35.3 g/dL (ref 30.0–36.0)
MCV: 95.1 fL (ref 80.0–100.0)
Platelets: 132 K/uL — ABNORMAL LOW (ref 150–400)
RBC: 3.87 MIL/uL — ABNORMAL LOW (ref 4.22–5.81)
RDW: 15.5 % (ref 11.5–15.5)
WBC: 15.2 K/uL — ABNORMAL HIGH (ref 4.0–10.5)
nRBC: 0 % (ref 0.0–0.2)

## 2024-03-02 LAB — BASIC METABOLIC PANEL WITH GFR
Anion gap: 19 — ABNORMAL HIGH (ref 5–15)
Anion gap: 13 (ref 5–15)
BUN: 29 mg/dL — ABNORMAL HIGH (ref 8–23)
BUN: 33 mg/dL — ABNORMAL HIGH (ref 8–23)
CO2: 17 mmol/L — ABNORMAL LOW (ref 22–32)
CO2: 18 mmol/L — ABNORMAL LOW (ref 22–32)
Calcium: 8.2 mg/dL — ABNORMAL LOW (ref 8.9–10.3)
Calcium: 7.7 mg/dL — ABNORMAL LOW (ref 8.9–10.3)
Chloride: 104 mmol/L (ref 98–111)
Chloride: 110 mmol/L (ref 98–111)
Creatinine, Ser: 3.29 mg/dL — ABNORMAL HIGH (ref 0.61–1.24)
Creatinine, Ser: 3.61 mg/dL — ABNORMAL HIGH (ref 0.61–1.24)
GFR, Estimated: 20 mL/min — ABNORMAL LOW (ref >60)
GFR, Estimated: 17 mL/min — ABNORMAL LOW (ref >60)
Glucose, Bld: 110 mg/dL — ABNORMAL HIGH (ref 70–99)
Glucose, Bld: 89 mg/dL (ref 70–99)
Potassium: 3.3 mmol/L — ABNORMAL LOW (ref 3.5–5.1)
Potassium: 2.4 mmol/L — CL (ref 3.5–5.1)
Sodium: 140 mmol/L (ref 135–145)
Sodium: 141 mmol/L (ref 135–145)

## 2024-03-02 LAB — MAGNESIUM
Magnesium: 2.5 mg/dL — ABNORMAL HIGH (ref 1.7–2.4)
Magnesium: 2.2 mg/dL (ref 1.7–2.4)

## 2024-03-02 LAB — RENAL FUNCTION PANEL
Albumin: 1.6 g/dL — ABNORMAL LOW (ref 3.5–5.0)
Anion gap: 15 (ref 5–15)
BUN: 32 mg/dL — ABNORMAL HIGH (ref 8–23)
CO2: 19 mmol/L — ABNORMAL LOW (ref 22–32)
Calcium: 7.9 mg/dL — ABNORMAL LOW (ref 8.9–10.3)
Chloride: 107 mmol/L (ref 98–111)
Creatinine, Ser: 3.61 mg/dL — ABNORMAL HIGH (ref 0.61–1.24)
GFR, Estimated: 17 mL/min — ABNORMAL LOW (ref >60)
Glucose, Bld: 120 mg/dL — ABNORMAL HIGH (ref 70–99)
Phosphorus: 3 mg/dL (ref 2.5–4.6)
Potassium: 2.3 mmol/L — CL (ref 3.5–5.1)
Sodium: 141 mmol/L (ref 135–145)

## 2024-03-02 LAB — TROPONIN I (HIGH SENSITIVITY): Troponin I (High Sensitivity): 137 ng/L (ref <18)

## 2024-03-02 LAB — TSH: TSH: 0.959 u[IU]/mL (ref 0.350–4.500)

## 2024-03-02 LAB — C DIFFICILE QUICK SCREEN W PCR REFLEX
C Diff antigen: NEGATIVE
C Diff interpretation: NOT DETECTED
C Diff toxin: NEGATIVE

## 2024-03-02 LAB — CORTISOL: Cortisol, Plasma: 34.3 ug/dL

## 2024-03-02 LAB — PROCALCITONIN: Procalcitonin: 2.09 ng/mL

## 2024-03-02 LAB — PHOSPHORUS: Phosphorus: 2.9 mg/dL (ref 2.5–4.6)

## 2024-03-02 MED ORDER — POTASSIUM CHLORIDE 2 MEQ/ML IV SOLN
INTRAVENOUS | Status: AC
  Filled 2024-03-02 (×2): qty 1000

## 2024-03-02 MED ORDER — LORAZEPAM 0.5 MG PO TABS
0.5000 mg | ORAL_TABLET | Freq: Four times a day (QID) | ORAL | Status: DC | PRN
  Administered 2024-03-02: 0.5 mg via ORAL
  Filled 2024-03-02: qty 1

## 2024-03-02 MED ORDER — POTASSIUM CHLORIDE 10 MEQ/100ML IV SOLN
10.0000 meq | INTRAVENOUS | Status: DC
Start: 2024-03-02 — End: 2024-03-02

## 2024-03-02 MED ORDER — ADULT MULTIVITAMIN W/MINERALS CH
1.0000 | ORAL_TABLET | Freq: Every day | ORAL | Status: DC
  Administered 2024-03-02: 1 via ORAL
  Filled 2024-03-02: qty 1

## 2024-03-02 MED ORDER — POTASSIUM CHLORIDE 10 MEQ/100ML IV SOLN
10.0000 meq | INTRAVENOUS | Status: AC
  Administered 2024-03-02 – 2024-03-03 (×6): 10 meq via INTRAVENOUS
  Filled 2024-03-02 (×2): qty 100

## 2024-03-02 MED ORDER — SODIUM CHLORIDE 0.9 % IV BOLUS: 500.0000 mL | Freq: Once | INTRAVENOUS | Status: DC | PRN

## 2024-03-02 MED ORDER — HYALURONIDASE HUMAN 150 UNIT/ML IJ SOLN
150.0000 [IU] | Freq: Once | INTRAMUSCULAR | Status: AC
  Administered 2024-03-02: 150 [IU] via SUBCUTANEOUS
  Filled 2024-03-02: qty 1

## 2024-03-02 MED ORDER — POTASSIUM CHLORIDE CRYS ER 20 MEQ PO TBCR: 40.0000 meq | EXTENDED_RELEASE_TABLET | ORAL | Status: AC

## 2024-03-02 MED ORDER — POTASSIUM CHLORIDE 10 MEQ/100ML IV SOLN
10.0000 meq | INTRAVENOUS | Status: AC
  Administered 2024-03-02 (×4): 10 meq via INTRAVENOUS
  Filled 2024-03-02 (×4): qty 100

## 2024-03-02 MED ORDER — POTASSIUM CHLORIDE 10 MEQ/100ML IV SOLN
10.0000 meq | INTRAVENOUS | Status: AC
  Administered 2024-03-02 (×2): 10 meq via INTRAVENOUS
  Filled 2024-03-02 (×2): qty 100

## 2024-03-02 NOTE — ED Notes (Signed)
 Close Friend Dickey Kiang [phone] would like an update asap

## 2024-03-02 NOTE — ED Notes (Signed)
 Patient placed on bare hugger after soiled linens and brief were changed. Patient is comfortably resting in bed at this time.   Took his medicine crushed in applesauce, unable to get him to drink potassium drink.

## 2024-03-02 NOTE — ED Notes (Signed)
 Called CT to inquire about scans. Per CT tech, the abdomen with contrast was cancelled by Zacowski,Scott MD but he still wanted the CT head. CT stated they would get PT in a few.

## 2024-03-02 NOTE — ED Notes (Signed)
 Unable to obtain blood work, IT trainer aware.

## 2024-03-02 NOTE — ED Notes (Signed)
 Sister would like an update. Elyn Balloon [phone]

## 2024-03-02 NOTE — Progress Notes (Signed)
 Pt arrived from ED, with linens soaked.  Pt without verbal responses, eyes teary.  CHG performed, VS taken and charted, CCMD notified.  Foam dressings placed to sacrum, and bilateral buttocks, all with stage 2 wounds.  Foam dressings also placed on bilateral heels pro phylactically.  Bear hugger placed and new bag of potassium started, appears to the 2nd of 4.  Will cont plan of care

## 2024-03-02 NOTE — ED Notes (Signed)
 PT given a [place] sandwich and drink. PT able to eat without assistance.

## 2024-03-02 NOTE — ED Notes (Signed)
 Admitting MD notified of critical lab value  Potassium 2.3

## 2024-03-02 NOTE — Progress Notes (Signed)
 Called to patients room HR 180's, BP 80's 90's, Rapid RN and MD to the bedside, EKG showed SVT, pt after about 15 minutes went  back to ST 110's 120', new orders received, BP now 100's

## 2024-03-02 NOTE — ED Notes (Signed)
 Phleb attempted twice for INR without success

## 2024-03-02 NOTE — Progress Notes (Signed)
 PROGRESS NOTE    Dennis Zhang  FMW:[id number] DOB: Mar 20, 1956 DOA: 03/01/2024 PCP: [facility]  Outpatient Specialists:     Brief Narrative:  -Patient was admitted earlier today.  As per H&P done on presentation: Dennis Zhang is a 67 y.o. male with medical history significant of chronic pain, diabetes, hypertension, EMEA who presented to the emergency department due to failure to thrive.  Patient was found to be covered in stool and urine.  He was brought to the ER for further assessment.  Per patient he lives with 300 other people who help take care of him.  He states he has been eating but has had a poor appetite.  On arrival to the ER labs were obtained which showed WBC 13.9, hemoglobin 12.9, platelets 174, potassium less than 2, creatinine 3.4, total bilirubin 1.3 magnesium  1.4.  Patient had CT head which showed no acute findings.  Patient was started on electrolyte repletion and admitted for further workup.  On admission he denied complaints and states that he felt as if he was at his baseline.  He was unable provide good history regarding his presentation.  03/02/2024: Patient seen alongside patient's nurse.  No significant history from patient.  Potassium initially improved from less than 2-3.3 and 2.3.  Will continue to monitor and replete potassium.  Magnesium  of 1.4.  Patient received 4 g of IV magnesium .  Repeat magnesium  level was 2.5.  Albumin of 1.9 and T. bili of 1.3.  BUN of 29 and serum creatinine of 3.29 on presentation.  Negative CT head and chest x-ray.  Patient remains hypothermic.  Will check urinalysis, urine culture, blood culture, cortisol and TSH.  Patient will be managed expectantly.  Patient looks very thin/cachectic, with skin changes suggestive of possible vitamin deficiencies.  Monitor closely for possible refeeding syndrome.   Assessment & Plan:   Principal Problem:   Hypokalemia   -Failure to  thrive: -Hypokalemia: -Hypomagnesemia: -History of CVA: -AKI on CKD stage IIIa: Likely multifactorial - Hypertension:  -Chronic pain: -BPH:  - Patient presented with poor appetite found in urine and stool - Previous presentation in September with similar issues found to have significant electrolyte abnormalities.  At that time he was found to have a stroke.   Subjective: No history from patient.  Objective: Vitals:   03/02/24 0700 03/02/24 0752 03/02/24 0757 03/02/24 0944  BP: 96/64 (!) 123/96  125/85  Pulse:  84  85  Resp: 13 13  16   Temp:   (!) 95.2 F (35.1 C)   TempSrc:   Temporal   SpO2:  100%  100%  Weight:      Height:        Intake/Output Summary (Last 24 hours) at 03/02/2024 1158 Last data filed at 03/02/2024 0127 Gross per 24 hour  Intake 1332.6 ml  Output --  Net 1332.6 ml   Filed Weights   03/01/24 1838  Weight: 67.1 kg    Examination:  General exam: Cachectic.  Niccoli ill looking.   Respiratory system: Clear to auscultation. Respiratory effort normal. Cardiovascular system: S1 & S2 Gastrointestinal system: Abdomen is soft and nontender.    Data Reviewed: I have personally reviewed following labs and imaging studies  CBC: Recent Labs  Lab 03/01/24 2145 03/02/24 0300  WBC 13.9* 15.2*  NEUTROABS 11.8*  --   HGB 12.9* 13.0  HCT 36.3* 36.8*  MCV 93.3 95.1  PLT 174 132*   Basic Metabolic Panel: Recent Labs  Lab 03/01/24 2145 03/02/24 0300  NA  140 140  K <2.0* 3.3*  CL 97* 104  CO2 24 17*  GLUCOSE 141* 110*  BUN 29* 29*  CREATININE 3.42* 3.29*  CALCIUM  8.7* 8.2*  MG 1.4*  --    GFR: Estimated Creatinine Clearance: 20.1 mL/min (A) (by C-G formula based on SCr of 3.29 mg/dL (H)). Liver Function Tests: Recent Labs  Lab 03/01/24 2145  AST 39  ALT 18  ALKPHOS 162*  BILITOT 1.3*  PROT 7.3  ALBUMIN 1.9*   Recent Labs  Lab 03/01/24 2145  LIPASE 16   No results for input(s): AMMONIA in the last 168 hours. Coagulation  Profile: No results for input(s): INR, PROTIME in the last 168 hours. Cardiac Enzymes: No results for input(s): CKTOTAL, CKMB, CKMBINDEX, TROPONINI in the last 168 hours. BNP (last 3 results) No results for input(s): PROBNP in the last 8760 hours. HbA1C: No results for input(s): HGBA1C in the last 72 hours. CBG: No results for input(s): GLUCAP in the last 168 hours. Lipid Profile: No results for input(s): CHOL, HDL, LDLCALC, TRIG, CHOLHDL, LDLDIRECT in the last 72 hours. Thyroid  Function Tests: No results for input(s): TSH, T4TOTAL, FREET4, T3FREE, THYROIDAB in the last 72 hours. Anemia Panel: No results for input(s): VITAMINB12, FOLATE, FERRITIN, TIBC, IRON, RETICCTPCT in the last 72 hours. Urine analysis:    Component Value Date/Time   COLORURINE YELLOW 01/29/2024 1124   APPEARANCEUR HAZY (A) 01/29/2024 1124   LABSPEC 1.005 01/29/2024 1124   PHURINE 5.0 01/29/2024 1124   GLUCOSEU NEGATIVE 01/29/2024 1124   HGBUR MODERATE (A) 01/29/2024 1124   BILIRUBINUR NEGATIVE 01/29/2024 1124   KETONESUR NEGATIVE 01/29/2024 1124   PROTEINUR NEGATIVE 01/29/2024 1124   UROBILINOGEN 1.0 06/14/2014 1847   NITRITE POSITIVE (A) 01/29/2024 1124   LEUKOCYTESUR NEGATIVE 01/29/2024 1124   Sepsis Labs: @LABRCNTIP (procalcitonin:4,lacticidven:4)  ) Recent Results (from the past 240 hours)  C Difficile Quick Screen w PCR reflex     Status: None   Collection Time: 03/02/24  3:30 AM   Specimen: STOOL  Result Value Ref Range Status   C Diff antigen NEGATIVE NEGATIVE Final   C Diff toxin NEGATIVE NEGATIVE Final   C Diff interpretation No C. difficile detected.  Final    Comment: Performed at [facility], [address], [place], [place] [address]         Radiology Studies: CT Head Wo Contrast Result Date: 03/02/2024 CLINICAL DATA:  Mental status change, unknown cause EXAM: CT HEAD WITHOUT CONTRAST TECHNIQUE: Contiguous axial images  were obtained from the base of the skull through the vertex without intravenous contrast. RADIATION DOSE REDUCTION: This exam was performed according to the departmental dose-optimization program which includes automated exposure control, adjustment of the mA and/or kV according to patient size and/or use of iterative reconstruction technique. COMPARISON:  01/27/2024 FINDINGS: Brain: There is atrophy and chronic small vessel disease changes. No acute intracranial abnormality. Specifically, no hemorrhage, hydrocephalus, mass lesion, acute infarction, or significant intracranial injury. Vascular: No hyperdense vessel or unexpected calcification. Skull: No acute calvarial abnormality. Sinuses/Orbits: No acute findings Other: None IMPRESSION: Atrophy, chronic microvascular disease. No acute intracranial abnormality. Electronically Signed   By: Franky Crease M.D.   On: 03/02/2024 01:29   DG Chest Port 1 View Result Date: 03/01/2024 CLINICAL DATA:  Failure to thrive EXAM: PORTABLE CHEST 1 VIEW COMPARISON:  01/29/2024 FINDINGS: The heart size and mediastinal contours are within normal limits. Both lungs are clear. The visualized skeletal structures are unremarkable. IMPRESSION: No active disease. Electronically Signed  By: Franky Crease M.D.   On: 03/01/2024 21:50        Scheduled Meds:  amLODipine   5 mg Oral Daily   atorvastatin   10 mg Oral Daily   clopidogrel   75 mg Oral Daily   DULoxetine   30 mg Oral Daily   enoxaparin  (LOVENOX ) injection  30 mg Subcutaneous Q24H   multivitamin with minerals  1 tablet Oral Daily   potassium chloride   40 mEq Oral Q4H   tamsulosin   0.4 mg Oral QPC breakfast   Continuous Infusions:  sodium chloride  100 mL/hr at 03/02/24 0422     LOS: 1 day    Time spent: 55 minutes (no bill)    Leatrice Chapel, MD  [facility] Pager #: [phone] 7PM-7AM contact night coverage as above

## 2024-03-02 NOTE — ED Notes (Addendum)
 PT has gotten more confused and reported to me that his arm is getting bigger. This RN inspected his right arm and noticed that it was increasingly larger than the left arm, cool to touch and very tight. IV potassium and maintenance normal saline was stopped at this time. PT's IV was then flushed and had major resistance.PT's arm was elevated and heating pads were placed along arm. 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

## 2024-03-02 NOTE — H&P (Addendum)
 History and Physical    Dennis Zhang FMW:[id number] DOB: [birth year] DOA: 03/01/2024  PCP: [facility]   Chief Complaint: Failure to thrive  HPI: Dennis Zhang is a 68 y.o. male with medical history significant of chronic pain, diabetes, hypertension, EMEA who presented to the emergency department due to failure to thrive.  Patient was found to be covered in stool and urine.  He was brought to the ER for further assessment.  Per patient he lives with 300 other people who help take care of him.  He states he has been eating but has had a poor appetite.  On arrival to the ER labs were obtained which showed WBC 13.9, hemoglobin 12.9, platelets 174, potassium less than 2, creatinine 3.4, total bilirubin 1.3 magnesium  1.4.  Patient had CT head which showed no acute findings.  Patient was started on electrolyte repletion and admitted for further workup.  On admission he denied complaints and states that he felt as if he was at his baseline.  He was unable provide good history regarding his presentation.   Review of Systems: Review of Systems  Constitutional: Negative.   HENT: Negative.    Eyes: Negative.   Respiratory: Negative.    Cardiovascular: Negative.   Gastrointestinal: Negative.   Genitourinary: Negative.   Musculoskeletal: Negative.   Skin: Negative.   Neurological: Negative.   Endo/Heme/Allergies: Negative.   Psychiatric/Behavioral: Negative.    All other systems reviewed and are negative.    As per HPI otherwise 10 point review of systems negative.   Allergies  Allergen Reactions   Ace Inhibitors     Kidney injury 05/2014-do not Rx   Aspirin  Nausea Only and Other (See Comments)    Reaction to Bayer aspirin  - causes acid reflux and nausea   Metformin Other (See Comments)    Other reaction(s): Abdominal pain   Methocarbamol Nausea And Vomiting   Tylenol  [Acetaminophen ] Nausea And Vomiting    States can take Tylenol  if has other pain med w/it - like  Hydrocodone      Past Medical History:  Diagnosis Date   Arthritis    Chronic pain    Depression    Diabetes mellitus without complication (HCC)    borderline   Hepatitis    Hyperlipidemia    Hypertension    Seizures (HCC)     Past Surgical History:  Procedure Laterality Date   ABDOMINAL SURGERY     APPENDECTOMY     CIRCUMCISION N/A 12/11/2023   Procedure: CIRCUMCISION, ADULT;  Surgeon: Elisabeth Valli BIRCH, MD;  Location: [facility];  Service: Urology;  Laterality: N/A;  CIRCUMCISION, EXCISIONAL PENILE BIOPSY   CYST EXCISION     HEMORROIDECTOMY     PENILE BIOPSY N/A 12/11/2023   Procedure: BIOPSY, PENIS;  Surgeon: Elisabeth Valli BIRCH, MD;  Location: [facility];  Service: Urology;  Laterality: N/A;     reports that he has been smoking cigarettes. He has a 15 pack-year smoking history. He has never used smokeless tobacco. He reports current alcohol  use. He reports that he does not use drugs.  Family History  Problem Relation Age of Onset   Hypertension Mother    Migraines Sister    Heart failure Brother    Migraines Brother     Prior to Admission medications   Medication Sig Start Date End Date Taking? Authorizing Provider  albuterol  (VENTOLIN  HFA) 108 (90 Base) MCG/ACT inhaler Inhale 2 puffs into the lungs every 6 (six) hours as needed for wheezing or shortness of breath.  [provider]  amLODipine  (NORVASC ) 5 MG tablet Take 1 tablet (5 mg total) by mouth daily. 02/01/24   Ghimire, Donalda HERO, MD  artificial tears ophthalmic solution Place 1 drop into both eyes as needed for dry eyes.    [provider]  atorvastatin  (LIPITOR) 10 MG tablet Take 10 mg by mouth daily.    [provider]  Cholecalciferol 50 MCG (2000 UT) TABS Take 2,000 Units by mouth every morning.    [provider]  clopidogrel  (PLAVIX ) 75 MG tablet Take 1 tablet (75 mg total) by mouth daily. 02/02/24   Ghimire, Donalda HERO, MD  DULoxetine  (CYMBALTA ) 30 MG capsule Take 30 mg by mouth  daily. For pain and mood stabilization    [provider]  feeding supplement, ENSURE COMPLETE, (ENSURE COMPLETE) LIQD Take 237 mLs by mouth 3 (three) times daily between meals. 06/16/14   Samtani, Jai-Gurmukh, MD  folic acid  (FOLVITE ) 1 MG tablet Take 1 tablet (1 mg total) by mouth daily. 05/24/16   Jerri Keys, MD  gabapentin  (NEURONTIN ) 300 MG capsule Take 1 capsule (300 mg total) by mouth 4 (four) times daily as needed (for nerve pain.). 02/01/24   Ghimire, Donalda HERO, MD  latanoprost  (XALATAN ) 0.005 % ophthalmic solution Place 1 drop into both eyes at bedtime.    [provider]  Magnesium  Oxide 420 MG TABS Take 420 mg by mouth daily.     [provider]  Multiple Vitamin (MULTIVITAMIN WITH MINERALS) TABS tablet Take 1 tablet by mouth daily.    [provider]  potassium chloride  SA (KLOR-CON  M) 20 MEQ tablet Take 20 mEq by mouth daily after breakfast.    [provider]  sildenafil (VIAGRA) 100 MG tablet Take 50 mg by mouth daily as needed for erectile dysfunction.     [provider]  tamsulosin  (FLOMAX ) 0.4 MG CAPS capsule Take 1 capsule (0.4 mg total) by mouth daily after breakfast. 05/23/16   Jerri Keys, MD  thiamine  100 MG tablet Take 1 tablet (100 mg total) by mouth daily. 05/24/16   Jerri Keys, MD    Physical Exam: Vitals:   03/02/24 0145 03/02/24 0200 03/02/24 0211 03/02/24 0215  BP: (!) 63/47 98/83 98/83  94/70  Pulse:   92   Resp: 15 15 18 16   Temp:      TempSrc:      SpO2:   98%   Weight:      Height:       Physical Exam Constitutional:      Appearance: He is normal weight. He is ill-appearing and toxic-appearing.  HENT:     Head: Normocephalic.     Nose: Nose normal.     Mouth/Throat:     Mouth: Mucous membranes are moist.     Pharynx: Oropharynx is clear.  Eyes:     Conjunctiva/sclera: Conjunctivae normal.     Pupils: Pupils are equal, round, and reactive to light.  Cardiovascular:     Pulses: Normal pulses.     Heart  sounds: Normal heart sounds.  Pulmonary:     Effort: Pulmonary effort is normal.     Breath sounds: Normal breath sounds.  Abdominal:     General: Abdomen is flat. Bowel sounds are normal.     Palpations: Abdomen is soft.  Musculoskeletal:        General: Normal range of motion.     Cervical back: Normal range of motion.  Skin:    Capillary Refill: Capillary refill takes less than  2 seconds.  Neurological:     General: No focal deficit present.     Mental Status: He is alert. Mental status is at baseline.  Psychiatric:        Mood and Affect: Mood normal.        Labs on Admission: I have personally reviewed the patients's labs and imaging studies.  Assessment/Plan Principal Problem:   Hypokalemia   # Failure to thrive # Hypokalemia # Hypomagnesemia - Patient presented with poor appetite found in urine and stool - Previous presentation in September with similar issues found to have significant electrolyte abnormalities.  At that time he was found to have a stroke.  Plan: Replete electrolytes  # CVA-continue Plavix   # AKI on CKD stage IIIb-likely related prerenal in setting of dehydration and diarrhea.  Continue IV fluids  # Hypertension-hold antihypertensives  # Chronic pain-continue Cymbalta , Neurontin   # BPH-continue Flomax    Admission status: Inpatient Progressive  Certification: The appropriate patient status for this patient is INPATIENT. Inpatient status is judged to be reasonable and necessary in order to provide the required intensity of service to ensure the patient's safety. The patient's presenting symptoms, physical exam findings, and initial radiographic and laboratory data in the context of their chronic comorbidities is felt to place them at high risk for further clinical deterioration. Furthermore, it is not anticipated that the patient will be medically stable for discharge from the hospital within 2 midnights of admission.   * I certify that at  the point of admission it is my clinical judgment that the patient will require inpatient hospital care spanning beyond 2 midnights from the point of admission due to high intensity of service, high risk for further deterioration and high frequency of surveillance required.DEWAINE Lamar Dess MD [facility] If 7PM-7AM, please contact night-coverage [email or web address]  03/02/2024, 2:48 AM

## 2024-03-02 NOTE — ED Notes (Signed)
 Family updated as to patient's status.

## 2024-03-02 NOTE — Significant Event (Signed)
 Rapid Response Event Note   Reason for Call :  Tachycardia-180s, hypotension  Initial Focused Assessment:  Pt lying in bed with eyes open. He is moving all extremities spontaneously but will not track, speak, or follow commands. This is baseline since admission per RN. Lungs are diminished t/o. Skin warm/dry.  T-98.2, HR-170, BP-80/63, RR-20, SpO2-100% on RA.  Interventions:  EKG-Supraventricular tachycardia, R sup axis dev, poss R ventricular hypertrophy, Inferior infarct , age undetermined Cannot rule out Anterior infarct , age undetermined Marked ST abnormality, possible lateral subendocardial injury 500cc NS bolus BMP/Mg/Phos/Trop KCL replacement, MIVF with KCL Plan of Care:  Pt HR self converted to ST-110s. Since then, BP is better. Await lab values. Give K repletion. Continue to monitor pt closely. Please call RRT if further assistance neede.  Event Summary:   MD Notified: Dr. Keturah notified by bedside RN and came to bedside. Call Time:2008 Arrival Time:2012 End Upfz:[id number]  Tish Graeme Piety, RN

## 2024-03-02 NOTE — Significant Event (Addendum)
 Notified patient in SVT rate 170s. Associated hypotension BP 80-90s. RRT was called and running 500 cc bolus. EKG captured rate 176 appears possible AVNRT, associated likely ischemic change, ST depression laterally. By time of my arrival he is back in sinus tachycardia, rates in the 110s. Here with FTT, electrolyte abnormalities, AKI. Likely electrolyte abnormalities predisposing. Last K 2.3 this morning, has been receiving PO and IV repletion.   A/P  SVT borderline unstable - spontaneously converted  Hypokalemia, ongoing  -- Repeat EKG  -- Continue 500 CC bolus, add LR + Kcl 40 meq at 75 cc/hr  -- Tentatively ordered additional 6 runs Kcl IV, unable to take PO currently  -- Repeat BMP, Mg, Phos. Check troponin   Dorn Dawson, MD  [facility]

## 2024-03-03 ENCOUNTER — Inpatient Hospital Stay (HOSPITAL_COMMUNITY)

## 2024-03-03 DIAGNOSIS — R64 Cachexia: Secondary | ICD-10-CM

## 2024-03-03 DIAGNOSIS — N179 Acute kidney failure, unspecified: Principal | ICD-10-CM

## 2024-03-03 DIAGNOSIS — N189 Chronic kidney disease, unspecified: Secondary | ICD-10-CM

## 2024-03-03 DIAGNOSIS — R627 Adult failure to thrive: Secondary | ICD-10-CM | POA: Diagnosis not present

## 2024-03-03 DIAGNOSIS — E876 Hypokalemia: Secondary | ICD-10-CM | POA: Diagnosis not present

## 2024-03-03 DIAGNOSIS — G9341 Metabolic encephalopathy: Secondary | ICD-10-CM

## 2024-03-03 DIAGNOSIS — E8721 Acute metabolic acidosis: Secondary | ICD-10-CM

## 2024-03-03 LAB — RENAL FUNCTION PANEL
Albumin: <1.5 g/dL — ABNORMAL LOW (ref 3.5–5.0)
Anion gap: 17 — ABNORMAL HIGH (ref 5–15)
BUN: 34 mg/dL — ABNORMAL HIGH (ref 8–23)
CO2: 14 mmol/L — ABNORMAL LOW (ref 22–32)
Calcium: 7.8 mg/dL — ABNORMAL LOW (ref 8.9–10.3)
Chloride: 111 mmol/L (ref 98–111)
Creatinine, Ser: 3.99 mg/dL — ABNORMAL HIGH (ref 0.61–1.24)
GFR, Estimated: 15 mL/min — ABNORMAL LOW (ref >60)
Glucose, Bld: 80 mg/dL (ref 70–99)
Phosphorus: 3.5 mg/dL (ref 2.5–4.6)
Potassium: 3.1 mmol/L — ABNORMAL LOW (ref 3.5–5.1)
Sodium: 142 mmol/L (ref 135–145)

## 2024-03-03 LAB — CBC WITH DIFFERENTIAL/PLATELET
Abs Immature Granulocytes: 0.2 K/uL — ABNORMAL HIGH (ref 0.00–0.07)
Basophils Absolute: 0.1 K/uL (ref 0.0–0.1)
Basophils Relative: 1 %
Eosinophils Absolute: 0 K/uL (ref 0.0–0.5)
Eosinophils Relative: 0 %
HCT: 25.9 % — ABNORMAL LOW (ref 39.0–52.0)
Hemoglobin: 9.1 g/dL — ABNORMAL LOW (ref 13.0–17.0)
Immature Granulocytes: 2 %
Lymphocytes Relative: 6 %
Lymphs Abs: 0.6 K/uL — ABNORMAL LOW (ref 0.7–4.0)
MCH: 33.7 pg (ref 26.0–34.0)
MCHC: 35.1 g/dL (ref 30.0–36.0)
MCV: 95.9 fL (ref 80.0–100.0)
Monocytes Absolute: 0.5 K/uL (ref 0.1–1.0)
Monocytes Relative: 5 %
Neutro Abs: 8.6 K/uL — ABNORMAL HIGH (ref 1.7–7.7)
Neutrophils Relative %: 86 %
Platelets: 114 K/uL — ABNORMAL LOW (ref 150–400)
RBC: 2.7 MIL/uL — ABNORMAL LOW (ref 4.22–5.81)
RDW: 16.2 % — ABNORMAL HIGH (ref 11.5–15.5)
Smear Review: NORMAL
WBC: 10 K/uL (ref 4.0–10.5)
nRBC: 0.4 % — ABNORMAL HIGH (ref 0.0–0.2)

## 2024-03-03 LAB — TROPONIN I (HIGH SENSITIVITY): Troponin I (High Sensitivity): 138 ng/L (ref <18)

## 2024-03-03 LAB — MAGNESIUM: Magnesium: 2.3 mg/dL (ref 1.7–2.4)

## 2024-03-03 MED ORDER — ADULT MULTIVITAMIN LIQUID CH
15.0000 mL | Freq: Every day | ORAL | Status: DC
  Filled 2024-03-03: qty 15

## 2024-03-03 MED ORDER — GLYCOPYRROLATE 0.2 MG/ML IJ SOLN: 0.2000 mg | INTRAMUSCULAR | Status: DC | PRN

## 2024-03-03 MED ORDER — SODIUM CHLORIDE 0.9 % BOLUS PEDS: 500.0000 mL | Freq: Once | INTRAVENOUS | Status: DC

## 2024-03-03 MED ORDER — MIDAZOLAM HCL 2 MG/2ML IJ SOLN
2.0000 mg | INTRAMUSCULAR | Status: DC | PRN
  Administered 2024-03-03: 2 mg via INTRAVENOUS
  Filled 2024-03-03: qty 2

## 2024-03-03 MED ORDER — CLOPIDOGREL BISULFATE 75 MG PO TABS: 75.0000 mg | ORAL_TABLET | Freq: Every day | ORAL | Status: DC

## 2024-03-03 MED ORDER — ALBUTEROL SULFATE (2.5 MG/3ML) 0.083% IN NEBU: 2.5000 mg | INHALATION_SOLUTION | RESPIRATORY_TRACT | Status: DC | PRN

## 2024-03-03 MED ORDER — NOREPINEPHRINE 4 MG/250ML-% IV SOLN: 0.0000 ug/min | INTRAVENOUS | Status: DC

## 2024-03-03 MED ORDER — GLYCOPYRROLATE 1 MG PO TABS: 1.0000 mg | ORAL_TABLET | ORAL | Status: DC | PRN

## 2024-03-03 MED ORDER — SODIUM BICARBONATE 8.4 % IV SOLN: 25.0000 meq | Freq: Once | INTRAVENOUS | Status: DC

## 2024-03-03 MED ORDER — POLYVINYL ALCOHOL 1.4 % OP SOLN: 1.0000 [drp] | Freq: Four times a day (QID) | OPHTHALMIC | Status: DC | PRN

## 2024-03-03 MED ORDER — MORPHINE SULFATE (PF) 2 MG/ML IV SOLN
2.0000 mg | INTRAVENOUS | Status: DC | PRN
  Administered 2024-03-03 (×4): 2 mg via INTRAVENOUS
  Filled 2024-03-03 (×4): qty 1

## 2024-03-03 MED ORDER — SODIUM CHLORIDE 0.9 % IV SOLN: 250.0000 mL | INTRAVENOUS | Status: DC

## 2024-03-03 MED ORDER — FUROSEMIDE 10 MG/ML IJ SOLN: 40.0000 mg | Freq: Once | INTRAMUSCULAR | Status: DC

## 2024-03-03 MED ORDER — ATORVASTATIN CALCIUM 10 MG PO TABS: 10.0000 mg | ORAL_TABLET | Freq: Every day | ORAL | Status: DC

## 2024-03-03 MED ORDER — SODIUM CHLORIDE 0.9 % IV BOLUS
500.0000 mL | Freq: Once | INTRAVENOUS | Status: AC
  Administered 2024-03-03: 500 mL via INTRAVENOUS

## 2024-03-03 MED ORDER — POTASSIUM CHLORIDE 20 MEQ PO PACK: 40.0000 meq | PACK | ORAL | Status: AC

## 2024-03-03 MED ORDER — SODIUM CHLORIDE 0.9 % IV SOLN: INTRAVENOUS | Status: DC

## 2024-03-03 NOTE — Progress Notes (Signed)
 This nurse in room to evaluate patient patient vitals currently triggering red mews Dr. Rosario MD and rapid response notified with new orders for normal saline bolus of 500 ml. Patient safety ensured with call light in reach and bed in lowest position current care plan in progress

## 2024-03-03 NOTE — Progress Notes (Signed)
 Nutrition Brief Note  Chart reviewed. Pt now transitioning to comfort care.  No further nutrition interventions planned at this time.  Please re-consult as needed.   Drusilla Kanner, RDN, LDN Clinical Nutrition See AMiON for contact information.

## 2024-03-03 NOTE — Progress Notes (Signed)
 Pt had critical potassium of 2.4 and critical troponin of 137, Dr. Hall notified new orders received

## 2024-03-03 NOTE — Procedures (Signed)
 Cortrak  Person Inserting Tube:  Mady Dolly, RD Tube Type:  Cortrak - 43 inches Tube Size:  10 Tube Location:  Left nare Secured by: Bridle Technique Used to Measure Tube Placement:  Marking at nare/corner of mouth Cortrak Secured At:  70 cm Initial Placement Verification:  Xray  Cortrak Tube Team Note:  Consult received to place a Cortrak feeding tube.   X-ray is required. RN may begin using tube post confirmation of appropriate positioning.   If the tube becomes dislodged please keep the tube and contact the Cortrak team at [email or web address] for replacement.  If after hours and replacement cannot be delayed, place a NG tube and confirm placement with an abdominal x-ray.    Dolly Mady MS, RD, LDN Registered Dietitian Clinical Nutrition RD Inpatient Contact Info in Amion

## 2024-03-03 NOTE — Consult Note (Addendum)
 NAME:  Dennis Zhang, MRN:  [id number], DOB:  03/01/1956, LOS: 2 ADMISSION DATE:  03/01/2024, CONSULTATION DATE: 10/13 REFERRING MD: Dr. Rosario, CHIEF COMPLAINT: Acute encephalopathy  History of Present Illness:  68 year old male with past medical history as below, which is significant for diabetes, hepatitis, hyperlipidemia, and seizures.  He has a recent admission for failure to thrive and multiple metabolic derangements.  He was able to be treated and was discharged but again presented to [facility] emergency department shortly afterwards when he was found down and minimally responsive in a hotel room.  In the ED he was more arousable laboratory evaluation demonstrated potassium less than 2, magnesium  1.4, creatinine 2.4.  He was admitted to the hospitalist service for treatment of hypokalemia, failure to thrive, acute on chronic kidney disease, stroke, and hypertension.  10/13 AM the patient became less responsive, hypotensive, and labs demonstrated profound acidosis.  PCCM was asked to evaluate at bedside.  Pertinent  Medical History   has a past medical history of Arthritis, Chronic pain, Depression, Diabetes mellitus without complication (HCC), Hepatitis, Hyperlipidemia, Hypertension, and Seizures (HCC).   Significant Hospital Events: Including procedures, antibiotic start and stop dates in addition to other pertinent events     Interim History / Subjective:    Objective    Blood pressure 103/88, pulse (!) 101, temperature (!) 97 F (36.1 C), temperature source Axillary, resp. rate (!) 28, height 6' 2 (1.88 m), weight 67.1 kg, SpO2 97%.        Intake/Output Summary (Last 24 hours) at 03/03/2024 1136 Last data filed at 03/03/2024 0600 Gross per 24 hour  Intake 1266.89 ml  Output 100 ml  Net 1166.89 ml   Filed Weights   03/01/24 1838  Weight: 67.1 kg    Examination: General: Cachectic elderly appearing gentleman in mild respiratory distress HENT: Normocephalic,  atraumatic Lungs: Referred upper airway gurgling sounds Cardiovascular: Regular rate and rhythm Abdomen: Soft, nondistended Extremities: No acute deformity Neuro: Moans to painful stimuli only  Resolved problem list   Assessment and Plan   Failure to thrive Acute on chronic renal failure Dehydration Hypokalemia Mixed gap and nongap metabolic acidosis  Patient is minimally responsive and not protecting his airway in light of multiple metabolic derangements.  He has carried a diagnosis of failure to thrive previously and his exam demonstrates severe cachexia.  This is at least his second such admission in the last 2 months.  Options of transfer to ICU for life support including dialysis, vasopressors, and likely CRRT versus comfort care were offered to his niece who was present at the bedside and his sister in [place]  over the telephone.  They were very clear that we should focus our efforts on keeping him comfortable and understood that this means he will not survive this hospitalization.  Plan - Comfort care - Bolus dose morphine  as needed for comfort or respiratory distress - Discussed with Dr. Rosario - DNR-comfort - PCCM will sign off    Labs   CBC: Recent Labs  Lab 03/01/24 2145 03/02/24 0300 03/03/24 0859  WBC 13.9* 15.2* 10.0  NEUTROABS 11.8*  --  8.6*  HGB 12.9* 13.0 9.1*  HCT 36.3* 36.8* 25.9*  MCV 93.3 95.1 95.9  PLT 174 132* 114*    Basic Metabolic Panel: Recent Labs  Lab 03/01/24 2145 03/02/24 0300 03/02/24 1157 03/02/24 1158 03/02/24 2109 03/03/24 0444  NA 140 140 141  --  141 142  K <2.0* 3.3* 2.3*  --  2.4* 3.1*  CL 97*  104 107  --  110 111  CO2 24 17* 19*  --  18* 14*  GLUCOSE 141* 110* 120*  --  89 80  BUN 29* 29* 32*  --  33* 34*  CREATININE 3.42* 3.29* 3.61*  --  3.61* 3.99*  CALCIUM  8.7* 8.2* 7.9*  --  7.7* 7.8*  MG 1.4*  --   --  2.5* 2.2 2.3  PHOS  --   --  3.0  --  2.9 3.5   GFR: Estimated Creatinine Clearance: 16.6 mL/min (A)  (by C-G formula based on SCr of 3.99 mg/dL (H)). Recent Labs  Lab 03/01/24 2145 03/02/24 0300 03/02/24 1158 03/03/24 0859  PROCALCITON  --   --  2.09  --   WBC 13.9* 15.2*  --  10.0    Liver Function Tests: Recent Labs  Lab 03/01/24 2145 03/02/24 1157 03/03/24 0444  AST 39  --   --   ALT 18  --   --   ALKPHOS 162*  --   --   BILITOT 1.3*  --   --   PROT 7.3  --   --   ALBUMIN 1.9* 1.6* <1.5*   Recent Labs  Lab 03/01/24 2145  LIPASE 16   No results for input(s): AMMONIA in the last 168 hours.  ABG    Component Value Date/Time   PHART 7.458 (H) 10/04/2012 0650   PCO2ART 33.2 (L) 10/04/2012 0650   PO2ART 83.2 10/04/2012 0650   HCO3 23.2 10/04/2012 0650   TCO2 28 01/27/2024 1838   O2SAT 71.7 10/04/2012 1701     Coagulation Profile: No results for input(s): INR, PROTIME in the last 168 hours.  Cardiac Enzymes: No results for input(s): CKTOTAL, CKMB, CKMBINDEX, TROPONINI in the last 168 hours.  HbA1C: Hgb A1c MFr Bld  Date/Time Value Ref Range Status  01/28/2024 03:28 AM 5.1 4.8 - 5.6 % Final    Comment:    (NOTE) Diagnosis of Diabetes The following HbA1c ranges recommended by the American Diabetes Association (ADA) may be used as an aid in the diagnosis of diabetes mellitus.  Hemoglobin             Suggested A1C NGSP%              Diagnosis  <5.7                   Non Diabetic  5.7-6.4                Pre-Diabetic  >6.4                   Diabetic  <7.0                   Glycemic control for                       adults with diabetes.    10/03/2012 12:52 AM 5.2 <5.7 % Final    Comment:    (NOTE)                                                                       According to the ADA Clinical Practice Recommendations for 2011, when HbA1c is used  as a screening test:  >=6.5%   Diagnostic of Diabetes Mellitus           (if abnormal result is confirmed) 5.7-6.4%   Increased risk of developing Diabetes Mellitus References:Diagnosis  and Classification of Diabetes Mellitus,Diabetes Care,2011,34(Suppl 1):S62-S69 and Standards of Medical Care in         Diabetes - 2011,Diabetes Care,2011,34 (Suppl 1):S11-S61.    CBG: No results for input(s): GLUCAP in the last 168 hours.  Review of Systems:   Patient is encephalopathic and/or intubated; therefore, history has been obtained from chart review.    Past Medical History:  He,  has a past medical history of Arthritis, Chronic pain, Depression, Diabetes mellitus without complication (HCC), Hepatitis, Hyperlipidemia, Hypertension, and Seizures (HCC).   Surgical History:   Past Surgical History:  Procedure Laterality Date   ABDOMINAL SURGERY     APPENDECTOMY     CIRCUMCISION N/A 12/11/2023   Procedure: CIRCUMCISION, ADULT;  Surgeon: Elisabeth Valli BIRCH, MD;  Location: [facility];  Service: Urology;  Laterality: N/A;  CIRCUMCISION, EXCISIONAL PENILE BIOPSY   CYST EXCISION     HEMORROIDECTOMY     PENILE BIOPSY N/A 12/11/2023   Procedure: BIOPSY, PENIS;  Surgeon: Elisabeth Valli BIRCH, MD;  Location: [facility];  Service: Urology;  Laterality: N/A;     Social History:   reports that he has been smoking cigarettes. He has a 15 pack-year smoking history. He has never used smokeless tobacco. He reports current alcohol  use. He reports that he does not use drugs.   Family History:  His family history includes Heart failure in his brother; Hypertension in his mother; Migraines in his brother and sister.   Allergies Allergies  Allergen Reactions   Ace Inhibitors     Kidney injury 05/2014-do not Rx   Aspirin  Nausea Only and Other (See Comments)    Reaction to Bayer aspirin  - causes acid reflux and nausea   Metformin Other (See Comments)    Other reaction(s): Abdominal pain   Methocarbamol Nausea And Vomiting   Tylenol  [Acetaminophen ] Nausea And Vomiting    States can take Tylenol  if has other pain med w/it - like Hydrocodone       Home Medications  Prior to Admission medications    Medication Sig Start Date End Date Taking? Authorizing Provider  albuterol  (VENTOLIN  HFA) 108 (90 Base) MCG/ACT inhaler Inhale 2 puffs into the lungs every 6 (six) hours as needed for wheezing or shortness of breath.    [provider]  amLODipine  (NORVASC ) 5 MG tablet Take 1 tablet (5 mg total) by mouth daily. 02/01/24   Ghimire, Donalda HERO, MD  artificial tears ophthalmic solution Place 1 drop into both eyes as needed for dry eyes.    [provider]  atorvastatin  (LIPITOR) 10 MG tablet Take 10 mg by mouth daily.    [provider]  Cholecalciferol 50 MCG (2000 UT) TABS Take 2,000 Units by mouth every morning.    [provider]  clopidogrel  (PLAVIX ) 75 MG tablet Take 1 tablet (75 mg total) by mouth daily. 02/02/24   Ghimire, Donalda HERO, MD  DULoxetine  (CYMBALTA ) 30 MG capsule Take 30 mg by mouth daily. For pain and mood stabilization    [provider]  feeding supplement, ENSURE COMPLETE, (ENSURE COMPLETE) LIQD Take 237 mLs by mouth 3 (three) times daily between meals. 06/16/14   Samtani, Jai-Gurmukh, MD  folic acid  (FOLVITE ) 1 MG tablet Take 1 tablet (1 mg total) by mouth daily. 05/24/16   Jerri Keys,  MD  gabapentin  (NEURONTIN ) 300 MG capsule Take 1 capsule (300 mg total) by mouth 4 (four) times daily as needed (for nerve pain.). 02/01/24   Ghimire, Donalda HERO, MD  latanoprost  (XALATAN ) 0.005 % ophthalmic solution Place 1 drop into both eyes at bedtime.    [provider]  Magnesium  Oxide 420 MG TABS Take 420 mg by mouth daily.     [provider]  Multiple Vitamin (MULTIVITAMIN WITH MINERALS) TABS tablet Take 1 tablet by mouth daily.    [provider]  potassium chloride  SA (KLOR-CON  M) 20 MEQ tablet Take 20 mEq by mouth daily after breakfast.    [provider]  sildenafil (VIAGRA) 100 MG tablet Take 50 mg by mouth daily as needed for erectile dysfunction.     [provider]  tamsulosin  (FLOMAX ) 0.4 MG CAPS  capsule Take 1 capsule (0.4 mg total) by mouth daily after breakfast. 05/23/16   Jerri Keys, MD  thiamine  100 MG tablet Take 1 tablet (100 mg total) by mouth daily. 05/24/16   Jerri Keys, MD     Critical care time:      Deward Eastern, AGACNP-BC [facility]  See Amion for personal pager PCCM on call pager [phone] until 7pm. Please call Elink 7p-7a. [phone]  03/03/2024 12:44 PM

## 2024-03-03 NOTE — Progress Notes (Signed)
   03/03/24 0700  Assess: MEWS Score  Temp 97.6 F (36.4 C)  BP (!) 88/54  MAP (mmHg) (!) 62  Pulse Rate (!) 103  ECG Heart Rate (!) 103  Resp (!) 28  Level of Consciousness Alert  SpO2 100 %  O2 Device Room Air  Assess: MEWS Score  MEWS Temp 0  MEWS Systolic 1  MEWS Pulse 1  MEWS RR 2  MEWS LOC 0  MEWS Score 4  MEWS Score Color Red  Provider Notification  Provider Name/Title Dr.Ogbata  Date Provider Notified 03/03/24  Time Provider Notified [phone]  Method of Notification Page  Notification Reason Change in status  Provider response See new orders  Date of Provider Response 03/03/24  Time of Provider Response 0800  Notify: Rapid Response  Name of Rapid Response RN Notified Hannah RN +  Date Rapid Response Notified 03/03/24  Time Rapid Response Notified 0747  Assess: SIRS CRITERIA  SIRS Temperature  0  SIRS Respirations  1  SIRS Pulse 1  SIRS WBC 0  SIRS Score Sum  2

## 2024-03-03 NOTE — Progress Notes (Signed)
 PROGRESS NOTE    Davelle Anselmi  FMW:[id number] DOB: 04-15-1956 DOA: 03/01/2024 [facility]CP: [facility]  Outpatient Specialists:     Brief Narrative:  Patient is a 68 year old African-American male with past medical history significant for chronic pain, diabetes, hypertension, seizures and hepatitis.  Patient presented with failure to thrive.  Patient was found at home, covered in stool and urine.  Hospital course has been significant for hypothermia, severe hypokalemia (potassium of less than 2), magnesium  of 1.4, SVTs, hypertension and tachypneic.  Patient was noted to have significant metabolic acidosis.  Initial plan was to transfer patient to ICU for further management, however, patient was made comfort cares only.    Assessment & Plan:   Principal Problem:   Hypokalemia   -Failure to thrive: -Hypokalemia: -Hypomagnesemia: -History of CVA: -AKI on CKD stage IIIa: Likely multifactorial - Hypertension:  -Chronic pain: -BPH: Respiratory distress:  Patient is now comfort measures only.   Subjective: No history from patient.  Objective: Vitals:   03/03/24 0845 03/03/24 0850 03/03/24 0900 03/03/24 0949  BP: 102/61  (!) 89/52 103/88  Pulse:   (!) 104 (!) 101  Resp: (!) 34 (!) 21 (!) 29 (!) 28  Temp:    (!) 97 F (36.1 C)  TempSrc:    Axillary  SpO2: 99%  97% 97%  Weight:      Height:        Intake/Output Summary (Last 24 hours) at 03/03/2024 1052 Last data filed at 03/03/2024 0600 Gross per 24 hour  Intake 1266.89 ml  Output 100 ml  Net 1166.89 ml   Filed Weights   03/01/24 1838  Weight: 67.1 kg    Examination:  General exam: Cachectic.  Chronically ill looking.  In respiratory distress. Respiratory system: Decreased air entry.  Tachypneic.   Cardiovascular system: S1 & S2 Gastrointestinal system: Abdomen is soft and nontender. Neuro: Responsive.  Data Reviewed: I have personally reviewed following labs and imaging studies  CBC: Recent Labs   Lab 03/01/24 2145 03/02/24 0300 03/03/24 0859  WBC 13.9* 15.2* 10.0  NEUTROABS 11.8*  --  PENDING  HGB 12.9* 13.0 9.1*  HCT 36.3* 36.8* 25.9*  MCV 93.3 95.1 95.9  PLT 174 132* 114*   Basic Metabolic Panel: Recent Labs  Lab 03/01/24 2145 03/02/24 0300 03/02/24 1157 03/02/24 1158 03/02/24 2109 03/03/24 0444  NA 140 140 141  --  141 142  K <2.0* 3.3* 2.3*  --  2.4* 3.1*  CL 97* 104 107  --  110 111  CO2 24 17* 19*  --  18* 14*  GLUCOSE 141* 110* 120*  --  89 80  BUN 29* 29* 32*  --  33* 34*  CREATININE 3.42* 3.29* 3.61*  --  3.61* 3.99*  CALCIUM  8.7* 8.2* 7.9*  --  7.7* 7.8*  MG 1.4*  --   --  2.5* 2.2 2.3  PHOS  --   --  3.0  --  2.9 3.5   GFR: Estimated Creatinine Clearance: 16.6 mL/min (A) (by C-G formula based on SCr of 3.99 mg/dL (H)). Liver Function Tests: Recent Labs  Lab 03/01/24 2145 03/02/24 1157 03/03/24 0444  AST 39  --   --   ALT 18  --   --   ALKPHOS 162*  --   --   BILITOT 1.3*  --   --   PROT 7.3  --   --   ALBUMIN 1.9* 1.6* <1.5*   Recent Labs  Lab 03/01/24 2145  LIPASE 16  No results for input(s): AMMONIA in the last 168 hours. Coagulation Profile: No results for input(s): INR, PROTIME in the last 168 hours. Cardiac Enzymes: No results for input(s): CKTOTAL, CKMB, CKMBINDEX, TROPONINI in the last 168 hours. BNP (last 3 results) No results for input(s): PROBNP in the last 8760 hours. HbA1C: No results for input(s): HGBA1C in the last 72 hours. CBG: No results for input(s): GLUCAP in the last 168 hours. Lipid Profile: No results for input(s): CHOL, HDL, LDLCALC, TRIG, CHOLHDL, LDLDIRECT in the last 72 hours. Thyroid  Function Tests: Recent Labs    03/02/24 1251  TSH 0.959   Anemia Panel: No results for input(s): VITAMINB12, FOLATE, FERRITIN, TIBC, IRON, RETICCTPCT in the last 72 hours. Urine analysis:    Component Value Date/Time   COLORURINE YELLOW 01/29/2024 1124   APPEARANCEUR  HAZY (A) 01/29/2024 1124   LABSPEC 1.005 01/29/2024 1124   PHURINE 5.0 01/29/2024 1124   GLUCOSEU NEGATIVE 01/29/2024 1124   HGBUR MODERATE (A) 01/29/2024 1124   BILIRUBINUR NEGATIVE 01/29/2024 1124   KETONESUR NEGATIVE 01/29/2024 1124   PROTEINUR NEGATIVE 01/29/2024 1124   UROBILINOGEN 1.0 06/14/2014 1847   NITRITE POSITIVE (A) 01/29/2024 1124   LEUKOCYTESUR NEGATIVE 01/29/2024 1124   Sepsis Labs: @LABRCNTIP (procalcitonin:4,lacticidven:4)  ) Recent Results (from the past 240 hours)  C Difficile Quick Screen w PCR reflex     Status: None   Collection Time: 03/02/24  3:30 AM   Specimen: STOOL  Result Value Ref Range Status   C Diff antigen NEGATIVE NEGATIVE Final   C Diff toxin NEGATIVE NEGATIVE Final   C Diff interpretation No C. difficile detected.  Final    Comment: Performed at [facility], [address], [place], [place] [address]  Culture, blood (Routine X 2) w Reflex to ID Panel     Status: None (Preliminary result)   Collection Time: 03/02/24 10:59 AM   Specimen: BLOOD RIGHT HAND  Result Value Ref Range Status   Specimen Description BLOOD RIGHT HAND  Final   Special Requests   Final    BOTTLES DRAWN AEROBIC ONLY Blood Culture results may not be optimal due to an inadequate volume of blood received in culture bottles   Culture   Final    NO GROWTH < 24 HOURS Performed at [facility], [address] [place], [place] [address]    Report Status PENDING  Incomplete  Culture, blood (Routine X 2) w Reflex to ID Panel     Status: None (Preliminary result)   Collection Time: 03/02/24 11:04 AM   Specimen: BLOOD RIGHT ARM  Result Value Ref Range Status   Specimen Description BLOOD RIGHT ARM  Final   Special Requests   Final    BOTTLES DRAWN AEROBIC AND ANAEROBIC Blood Culture adequate volume   Culture   Final    NO GROWTH < 24 HOURS Performed at [facility], [address] [place], [place] [address]    Report Status PENDING  Incomplete          Radiology Studies: CT Head Wo Contrast Result Date: 03/02/2024 CLINICAL DATA:  Mental status change, unknown cause EXAM: CT HEAD WITHOUT CONTRAST TECHNIQUE: Contiguous axial images were obtained from the base of the skull through the vertex without intravenous contrast. RADIATION DOSE REDUCTION: This exam was performed according to the departmental dose-optimization program which includes automated exposure control, adjustment of the mA and/or kV according to patient size and/or use of iterative reconstruction technique. COMPARISON:  01/27/2024 FINDINGS: Brain:  There is atrophy and chronic small vessel disease changes. No acute intracranial abnormality. Specifically, no hemorrhage, hydrocephalus, mass lesion, acute infarction, or significant intracranial injury. Vascular: No hyperdense vessel or unexpected calcification. Skull: No acute calvarial abnormality. Sinuses/Orbits: No acute findings Other: None IMPRESSION: Atrophy, chronic microvascular disease. No acute intracranial abnormality. Electronically Signed   By: Franky Crease M.D.   On: 03/02/2024 01:29   DG Chest Port 1 View Result Date: 03/01/2024 CLINICAL DATA:  Failure to thrive EXAM: PORTABLE CHEST 1 VIEW COMPARISON:  01/29/2024 FINDINGS: The heart size and mediastinal contours are within normal limits. Both lungs are clear. The visualized skeletal structures are unremarkable. IMPRESSION: No active disease. Electronically Signed   By: Franky Crease M.D.   On: 03/01/2024 21:50        Scheduled Meds:  atorvastatin   10 mg Per Tube Daily   clopidogrel   75 mg Per Tube Daily   DULoxetine   30 mg Oral Daily   enoxaparin  (LOVENOX ) injection  30 mg Subcutaneous Q24H   furosemide  40 mg Intravenous Once   multivitamin  15 mL Per Tube Daily   potassium chloride   40 mEq Per Tube Q4H   sodium bicarbonate   25 mEq Intravenous Once   tamsulosin   0.4 mg Oral QPC breakfast   Continuous Infusions:  sodium chloride      lactated ringers   1,000 mL with potassium chloride  40 mEq infusion 75 mL/hr at 03/02/24 2157   norepinephrine  (LEVOPHED ) Adult infusion     sodium chloride        LOS: 2 days    Time spent: 55 minutes      Leatrice Chapel, MD  [facility] Pager #: [phone] 7PM-7AM contact night coverage as above

## 2024-03-03 NOTE — Progress Notes (Signed)
   03/03/24 1200  Spiritual Encounters  Type of Visit Initial  Care provided to: Pt and family  Referral source Nurse (RN/NT/LPN)  Reason for visit End-of-life  OnCall Visit Yes   Chaplain was paged to provide support for the family. The patient was in comfort care. His niece and nephew were present at the bedside. Chaplain was compassionately present, provided comfort and offered a prayer.    M.Kubra Susanna Kerry Resident [phone]

## 2024-03-03 NOTE — Significant Event (Signed)
 Rapid Response Event Note   Reason for Call :  Red [place]: borderline hypotension, increased RR  Initial Focused Assessment:  Cachetic male, ill-appearing patient lying A&Ox1 (self). Unable to follow any commands initially though after stimulation he was able to wiggle toes only. Skin warm, dry and flaky, tenting is present. No edema present. Lungs clear throughout. Congested cough present, unable to clear secretions--deep suctioned with moderate thick tan secretions obtained.   89/52 (65) HR 104 RR 25-35 O2 98% RA  Interventions/Plan of Care:  NS Bolus completed Oral care/suction: moderate thick tan secretions Labs Bladder scan, 54ml  NPO, cortrak recommended  Event Summary:  MD Notified: CANDIE Chapel MD Call Time:  Arrival Time: 0830 End Time: 9079  Tonna Chiquita POUR, RN

## 2024-03-07 LAB — CULTURE, BLOOD (ROUTINE X 2)
Culture: NO GROWTH
Culture: NO GROWTH
Special Requests: ADEQUATE

## 2024-03-22 NOTE — Death Summary Note (Addendum)
 Physician Discharge Summary  Dennis Zhang FMW:[id number] DOB: 1955/10/28 DOA: 2024-03-20  PCP: [facility]  Admit date: 03-20-2024 Patient died on: 2024/03/23. Time of death: 0045 hours. Pronounced by: Bennie Reasoner R.N. and Reden Agurie R.N. and Nathanel Dawn R.N.   Cause of death:  -SIRS -Severe electrolyte abnormality (hypokalemia and hypomagnesemia) -Failure to thrive -Acute kidney injury on chronic kidney disease stage III A -Hypotension/shock (possibly related to ineffective circulatory arterial volume) -Respiratory distress   Patient was a 68 year old African-American male with past medical history significant for chronic pain, diabetes, hypertension, seizures and hepatitis.  Patient presented with failure to thrive.  Patient was found at home, covered in stool and urine.  Hospital course was significant for hypothermia, severe hypokalemia (potassium of less than 2), magnesium  of 1.4, SVTs, hypotension and tachypnea.  Patient was noted to have significant metabolic acidosis.  Initial plan was to transfer patient to ICU for further management, however, patient was made comfort cares only.  Patient died on March 23, 2024 (see above).  Signed:  Leatrice Chapel, MD  [facility] Pager #: [phone] 7PM-7AM contact night coverage as above

## 2024-03-22 NOTE — Progress Notes (Signed)
 Patient passed away at 00:45 . Pronounced by Bennie Reasoner  R.N. and Reden Agurie R.N. and Nathanel Dawn R.N. Sister Elyn Balloon and emergency contact was notified at 00:55 of her brother's passing. Family will not be coming up tonight to see patient. 00:58 An Anatomical was called and made aware. Spoke with Theador Berth  and number was given [phone] . She requested hold on tissue and eye donation. And requested we put Saline Drops in his eyes and cover with saline moist quaze. This was sone by Reden R.N. Patient was prepared and taken to [facility] with  patients papers and keys and upper and lower dentures. Dr Keturah was notified of death at 00:54

## 2024-03-22 DEATH — deceased

## 2024-03-24 ENCOUNTER — Ambulatory Visit: Admitting: Cardiology

## 2024-04-10 ENCOUNTER — Inpatient Hospital Stay: Admitting: Diagnostic Neuroimaging
